# Patient Record
Sex: Female | Born: 1955 | Race: White | Hispanic: No | Marital: Married | Smoking: Never smoker
Health system: Southern US, Community
[De-identification: ages and names within clinical notes are randomized; demographics above are authoritative.]

## PROBLEM LIST (undated history)

## (undated) DIAGNOSIS — K5900 Constipation, unspecified: Secondary | ICD-10-CM

## (undated) DIAGNOSIS — M549 Dorsalgia, unspecified: Secondary | ICD-10-CM

## (undated) DIAGNOSIS — I509 Heart failure, unspecified: Secondary | ICD-10-CM

## (undated) DIAGNOSIS — F32A Depression, unspecified: Secondary | ICD-10-CM

## (undated) DIAGNOSIS — E079 Disorder of thyroid, unspecified: Secondary | ICD-10-CM

## (undated) DIAGNOSIS — M5136 Other intervertebral disc degeneration, lumbar region: Secondary | ICD-10-CM

## (undated) DIAGNOSIS — M51369 Other intervertebral disc degeneration, lumbar region without mention of lumbar back pain or lower extremity pain: Secondary | ICD-10-CM

## (undated) DIAGNOSIS — K449 Diaphragmatic hernia without obstruction or gangrene: Secondary | ICD-10-CM

## (undated) DIAGNOSIS — K219 Gastro-esophageal reflux disease without esophagitis: Secondary | ICD-10-CM

## (undated) DIAGNOSIS — F419 Anxiety disorder, unspecified: Secondary | ICD-10-CM

## (undated) DIAGNOSIS — F329 Major depressive disorder, single episode, unspecified: Secondary | ICD-10-CM

## (undated) DIAGNOSIS — M2550 Pain in unspecified joint: Secondary | ICD-10-CM

## (undated) DIAGNOSIS — G4730 Sleep apnea, unspecified: Secondary | ICD-10-CM

## (undated) DIAGNOSIS — I10 Essential (primary) hypertension: Secondary | ICD-10-CM

## (undated) DIAGNOSIS — M7989 Other specified soft tissue disorders: Secondary | ICD-10-CM

## (undated) DIAGNOSIS — E7800 Pure hypercholesterolemia, unspecified: Secondary | ICD-10-CM

## (undated) DIAGNOSIS — G4733 Obstructive sleep apnea (adult) (pediatric): Secondary | ICD-10-CM

## (undated) DIAGNOSIS — R32 Unspecified urinary incontinence: Secondary | ICD-10-CM

## (undated) DIAGNOSIS — R400 Somnolence: Secondary | ICD-10-CM

## (undated) DIAGNOSIS — R002 Palpitations: Secondary | ICD-10-CM

## (undated) DIAGNOSIS — Z9989 Dependence on other enabling machines and devices: Secondary | ICD-10-CM

## (undated) DIAGNOSIS — F988 Other specified behavioral and emotional disorders with onset usually occurring in childhood and adolescence: Secondary | ICD-10-CM

## (undated) DIAGNOSIS — K589 Irritable bowel syndrome without diarrhea: Secondary | ICD-10-CM

## (undated) HISTORY — DX: Sleep apnea, unspecified: G47.30

## (undated) HISTORY — PX: NASAL SEPTUM SURGERY: SHX37

## (undated) HISTORY — DX: Major depressive disorder, single episode, unspecified: F32.9

## (undated) HISTORY — DX: Pure hypercholesterolemia, unspecified: E78.00

## (undated) HISTORY — PX: BREAST EXCISIONAL BIOPSY: SUR124

## (undated) HISTORY — DX: Somnolence: R40.0

## (undated) HISTORY — DX: Depression, unspecified: F32.A

## (undated) HISTORY — PX: CATARACT EXTRACTION: SUR2

## (undated) HISTORY — PX: APPENDECTOMY: SHX54

## (undated) HISTORY — PX: KNEE ARTHROPLASTY: SHX992

## (undated) HISTORY — DX: Dorsalgia, unspecified: M54.9

## (undated) HISTORY — DX: Palpitations: R00.2

## (undated) HISTORY — DX: Dependence on other enabling machines and devices: Z99.89

## (undated) HISTORY — DX: Constipation, unspecified: K59.00

## (undated) HISTORY — DX: Essential (primary) hypertension: I10

## (undated) HISTORY — DX: Obstructive sleep apnea (adult) (pediatric): G47.33

## (undated) HISTORY — PX: ESOPHAGOGASTRODUODENOSCOPY: SHX1529

## (undated) HISTORY — PX: HAND SURGERY: SHX662

## (undated) HISTORY — DX: Pain in unspecified joint: M25.50

## (undated) HISTORY — DX: Disorder of thyroid, unspecified: E07.9

## (undated) HISTORY — DX: Other specified soft tissue disorders: M79.89

## (undated) HISTORY — PX: COLONOSCOPY: SHX174

## (undated) HISTORY — PX: TONSILLECTOMY AND ADENOIDECTOMY: SHX28

## (undated) HISTORY — DX: Irritable bowel syndrome, unspecified: K58.9

## (undated) HISTORY — DX: Other specified behavioral and emotional disorders with onset usually occurring in childhood and adolescence: F98.8

## (undated) HISTORY — DX: Diaphragmatic hernia without obstruction or gangrene: K44.9

## (undated) HISTORY — DX: Unspecified urinary incontinence: R32

## (undated) HISTORY — DX: Gastro-esophageal reflux disease without esophagitis: K21.9

## (undated) HISTORY — DX: Heart failure, unspecified: I50.9

## (undated) HISTORY — DX: Anxiety disorder, unspecified: F41.9

---

## 1997-10-17 ENCOUNTER — Ambulatory Visit (HOSPITAL_COMMUNITY): Admission: RE | Admit: 1997-10-17 | Discharge: 1997-10-17 | Payer: Self-pay | Admitting: Obstetrics and Gynecology

## 1998-07-29 HISTORY — PX: RADICAL HYSTERECTOMY: SHX2283

## 1998-11-10 ENCOUNTER — Ambulatory Visit (HOSPITAL_COMMUNITY): Admission: RE | Admit: 1998-11-10 | Discharge: 1998-11-10 | Payer: Self-pay | Admitting: Obstetrics and Gynecology

## 1998-11-10 ENCOUNTER — Encounter: Payer: Self-pay | Admitting: Obstetrics and Gynecology

## 1999-03-27 ENCOUNTER — Other Ambulatory Visit: Admission: RE | Admit: 1999-03-27 | Discharge: 1999-03-27 | Payer: Self-pay | Admitting: Obstetrics and Gynecology

## 1999-04-26 ENCOUNTER — Other Ambulatory Visit: Admission: RE | Admit: 1999-04-26 | Discharge: 1999-04-26 | Payer: Self-pay | Admitting: Obstetrics and Gynecology

## 1999-04-26 ENCOUNTER — Encounter (INDEPENDENT_AMBULATORY_CARE_PROVIDER_SITE_OTHER): Payer: Self-pay

## 1999-05-04 ENCOUNTER — Ambulatory Visit (HOSPITAL_BASED_OUTPATIENT_CLINIC_OR_DEPARTMENT_OTHER): Admission: RE | Admit: 1999-05-04 | Discharge: 1999-05-04 | Payer: Self-pay | Admitting: Otolaryngology

## 1999-12-26 ENCOUNTER — Encounter: Payer: Self-pay | Admitting: Obstetrics and Gynecology

## 1999-12-26 ENCOUNTER — Ambulatory Visit (HOSPITAL_COMMUNITY): Admission: RE | Admit: 1999-12-26 | Discharge: 1999-12-26 | Payer: Self-pay | Admitting: Obstetrics and Gynecology

## 2000-01-03 ENCOUNTER — Encounter: Admission: RE | Admit: 2000-01-03 | Discharge: 2000-01-03 | Payer: Self-pay | Admitting: Obstetrics and Gynecology

## 2000-01-03 ENCOUNTER — Encounter: Payer: Self-pay | Admitting: Obstetrics and Gynecology

## 2000-03-10 ENCOUNTER — Encounter (INDEPENDENT_AMBULATORY_CARE_PROVIDER_SITE_OTHER): Payer: Self-pay | Admitting: Specialist

## 2000-03-10 ENCOUNTER — Inpatient Hospital Stay (HOSPITAL_COMMUNITY): Admission: RE | Admit: 2000-03-10 | Discharge: 2000-03-12 | Payer: Self-pay | Admitting: Obstetrics and Gynecology

## 2000-08-29 ENCOUNTER — Encounter: Admission: RE | Admit: 2000-08-29 | Discharge: 2000-08-29 | Payer: Self-pay | Admitting: Urology

## 2000-08-29 ENCOUNTER — Encounter: Payer: Self-pay | Admitting: Urology

## 2001-03-03 ENCOUNTER — Other Ambulatory Visit: Admission: RE | Admit: 2001-03-03 | Discharge: 2001-03-03 | Payer: Self-pay | Admitting: Obstetrics and Gynecology

## 2001-03-06 ENCOUNTER — Encounter: Payer: Self-pay | Admitting: Obstetrics and Gynecology

## 2001-03-06 ENCOUNTER — Encounter: Admission: RE | Admit: 2001-03-06 | Discharge: 2001-03-06 | Payer: Self-pay | Admitting: Obstetrics and Gynecology

## 2002-03-04 ENCOUNTER — Other Ambulatory Visit: Admission: RE | Admit: 2002-03-04 | Discharge: 2002-03-04 | Payer: Self-pay | Admitting: Obstetrics and Gynecology

## 2002-06-14 ENCOUNTER — Encounter: Admission: RE | Admit: 2002-06-14 | Discharge: 2002-06-14 | Payer: Self-pay | Admitting: Family Medicine

## 2002-06-14 ENCOUNTER — Encounter: Payer: Self-pay | Admitting: Family Medicine

## 2002-06-15 ENCOUNTER — Emergency Department (HOSPITAL_COMMUNITY): Admission: EM | Admit: 2002-06-15 | Discharge: 2002-06-15 | Payer: Self-pay | Admitting: Emergency Medicine

## 2002-07-27 ENCOUNTER — Encounter: Payer: Self-pay | Admitting: Obstetrics and Gynecology

## 2002-07-27 ENCOUNTER — Ambulatory Visit (HOSPITAL_COMMUNITY): Admission: RE | Admit: 2002-07-27 | Discharge: 2002-07-27 | Payer: Self-pay | Admitting: Obstetrics and Gynecology

## 2003-03-14 ENCOUNTER — Other Ambulatory Visit: Admission: RE | Admit: 2003-03-14 | Discharge: 2003-03-14 | Payer: Self-pay | Admitting: Obstetrics and Gynecology

## 2003-11-29 ENCOUNTER — Ambulatory Visit (HOSPITAL_COMMUNITY): Admission: RE | Admit: 2003-11-29 | Discharge: 2003-11-29 | Payer: Self-pay | Admitting: Obstetrics and Gynecology

## 2004-03-19 ENCOUNTER — Other Ambulatory Visit: Admission: RE | Admit: 2004-03-19 | Discharge: 2004-03-19 | Payer: Self-pay | Admitting: Obstetrics and Gynecology

## 2004-12-04 ENCOUNTER — Encounter: Admission: RE | Admit: 2004-12-04 | Discharge: 2004-12-04 | Payer: Self-pay | Admitting: Obstetrics and Gynecology

## 2005-04-02 ENCOUNTER — Other Ambulatory Visit: Admission: RE | Admit: 2005-04-02 | Discharge: 2005-04-02 | Payer: Self-pay | Admitting: Obstetrics and Gynecology

## 2006-03-19 ENCOUNTER — Encounter: Admission: RE | Admit: 2006-03-19 | Discharge: 2006-03-19 | Payer: Self-pay | Admitting: Obstetrics and Gynecology

## 2006-04-01 ENCOUNTER — Encounter: Admission: RE | Admit: 2006-04-01 | Discharge: 2006-04-01 | Payer: Self-pay | Admitting: Obstetrics and Gynecology

## 2006-04-07 ENCOUNTER — Other Ambulatory Visit: Admission: RE | Admit: 2006-04-07 | Discharge: 2006-04-07 | Payer: Self-pay | Admitting: Obstetrics and Gynecology

## 2006-08-09 ENCOUNTER — Encounter: Admission: RE | Admit: 2006-08-09 | Discharge: 2006-08-09 | Payer: Self-pay | Admitting: Internal Medicine

## 2007-05-07 ENCOUNTER — Other Ambulatory Visit: Admission: RE | Admit: 2007-05-07 | Discharge: 2007-05-07 | Payer: Self-pay | Admitting: Obstetrics and Gynecology

## 2007-10-06 ENCOUNTER — Ambulatory Visit (HOSPITAL_COMMUNITY): Admission: RE | Admit: 2007-10-06 | Discharge: 2007-10-06 | Payer: Self-pay | Admitting: Obstetrics and Gynecology

## 2007-10-30 ENCOUNTER — Emergency Department (HOSPITAL_COMMUNITY): Admission: EM | Admit: 2007-10-30 | Discharge: 2007-10-30 | Payer: Self-pay | Admitting: Emergency Medicine

## 2008-05-10 ENCOUNTER — Other Ambulatory Visit: Admission: RE | Admit: 2008-05-10 | Discharge: 2008-05-10 | Payer: Self-pay | Admitting: Obstetrics and Gynecology

## 2008-05-25 ENCOUNTER — Encounter: Admission: RE | Admit: 2008-05-25 | Discharge: 2008-05-25 | Payer: Self-pay | Admitting: Obstetrics and Gynecology

## 2008-11-24 ENCOUNTER — Encounter: Admission: RE | Admit: 2008-11-24 | Discharge: 2008-11-24 | Payer: Self-pay | Admitting: Obstetrics and Gynecology

## 2009-05-11 ENCOUNTER — Other Ambulatory Visit: Admission: RE | Admit: 2009-05-11 | Discharge: 2009-05-11 | Payer: Self-pay | Admitting: Obstetrics and Gynecology

## 2009-05-17 ENCOUNTER — Encounter: Admission: RE | Admit: 2009-05-17 | Discharge: 2009-05-17 | Payer: Self-pay | Admitting: Obstetrics and Gynecology

## 2009-06-07 ENCOUNTER — Encounter: Admission: RE | Admit: 2009-06-07 | Discharge: 2009-06-07 | Payer: Self-pay | Admitting: Internal Medicine

## 2009-09-18 IMAGING — MG MM DIGITAL SCREENING BILAT
5 series · 5 of 5 positions shown · non-contrast
Comparison: none

DG SCREEN MAMMOGRAM BILATERAL
Bilateral CC and MLO view(s) were taken.

DIGITAL SCREENING MAMMOGRAM WITH CAD:
The breast tissue is heterogeneously dense.  No masses or malignant type calcifications are 
identified.  Compared with prior studies.

[R CC]
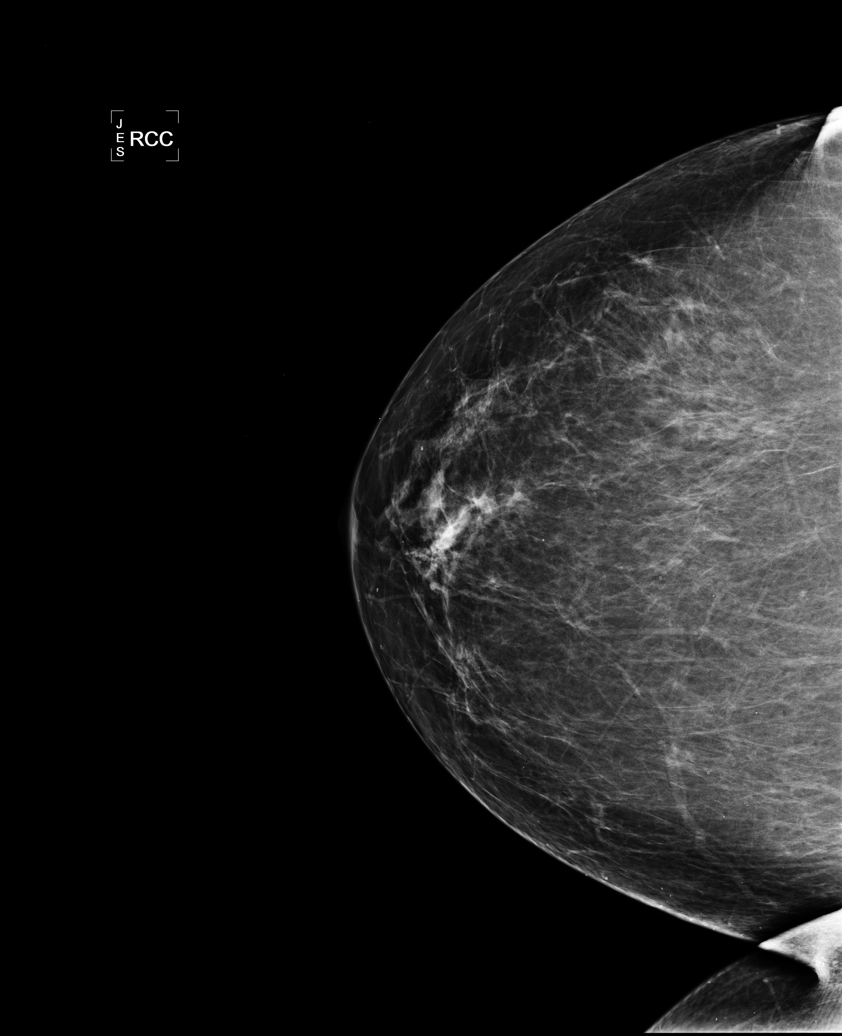

[R MLO]
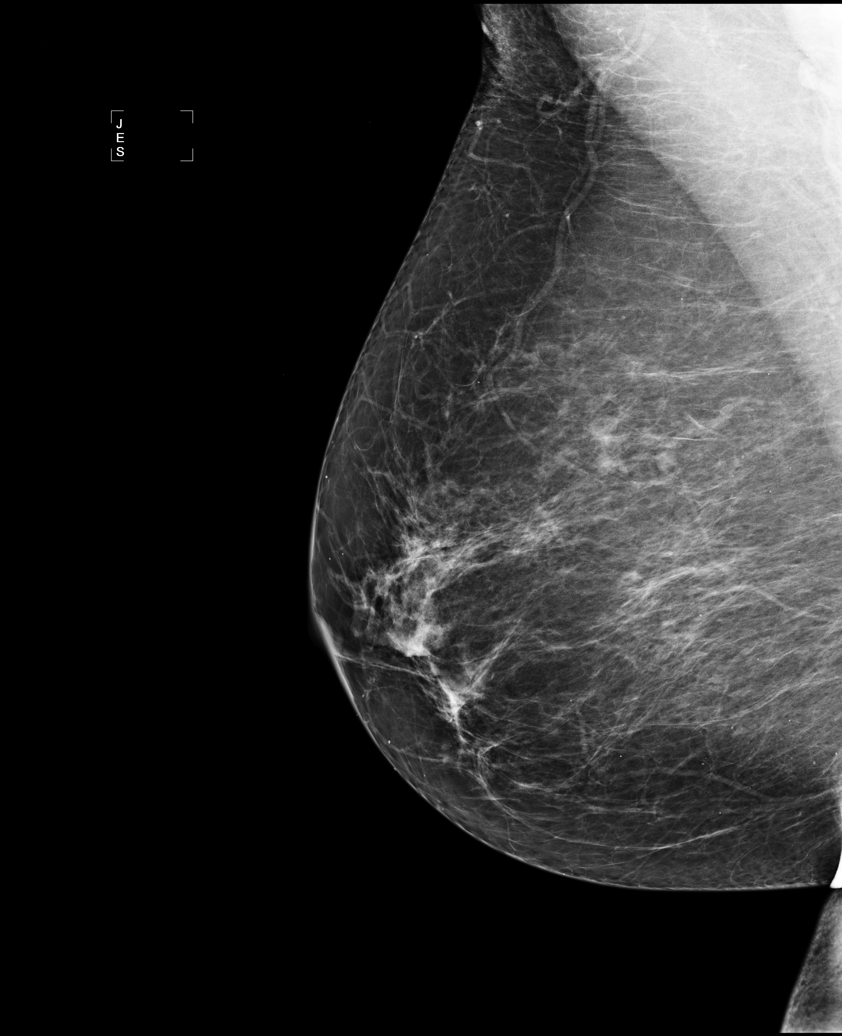

[L CC]
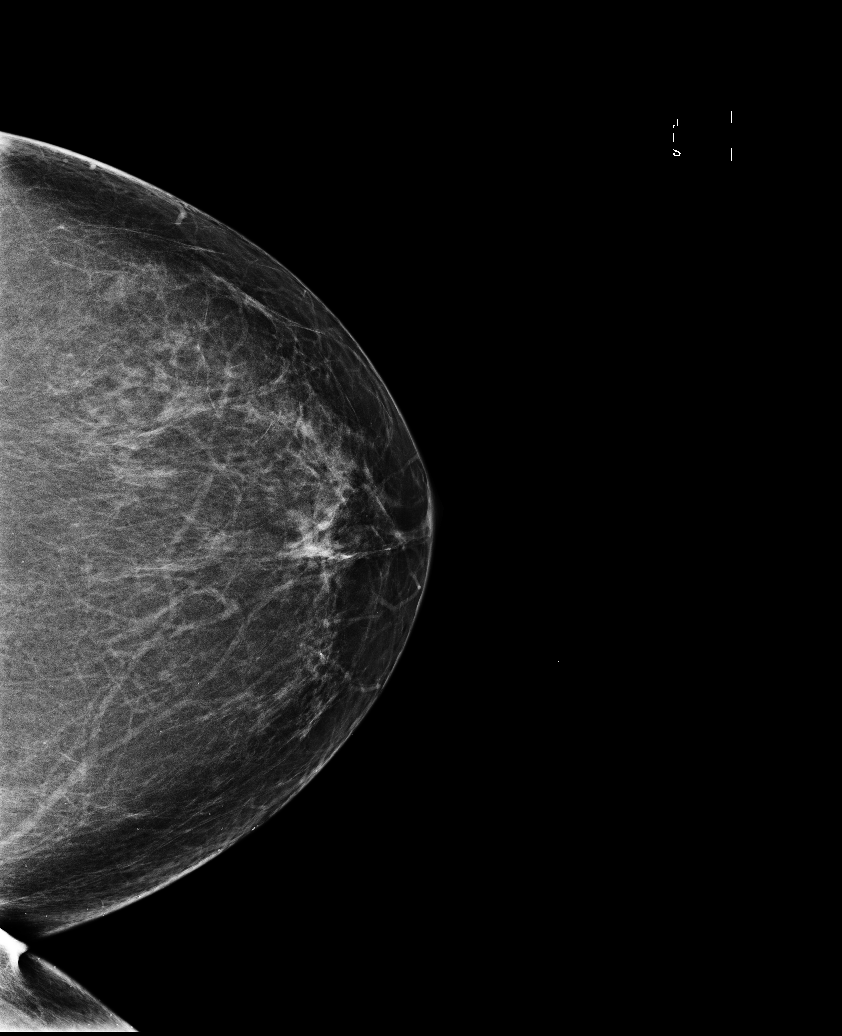

[L MLO (1 of 2)]
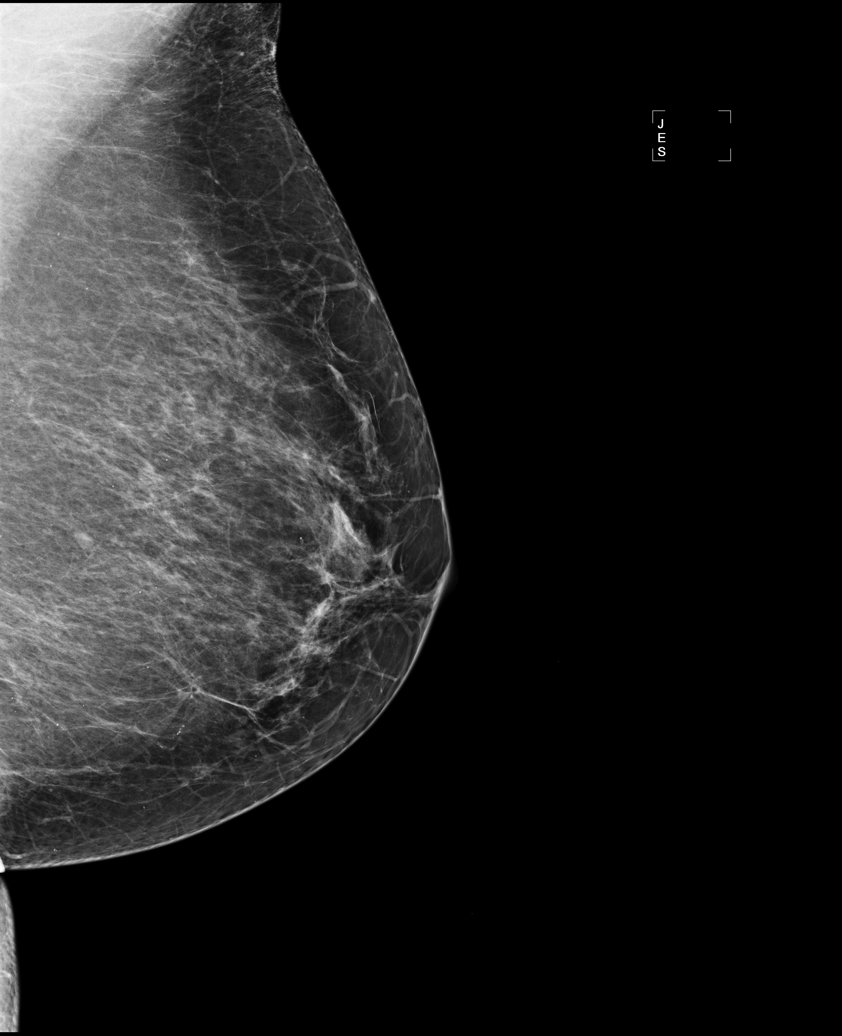

[L MLO (2 of 2)]
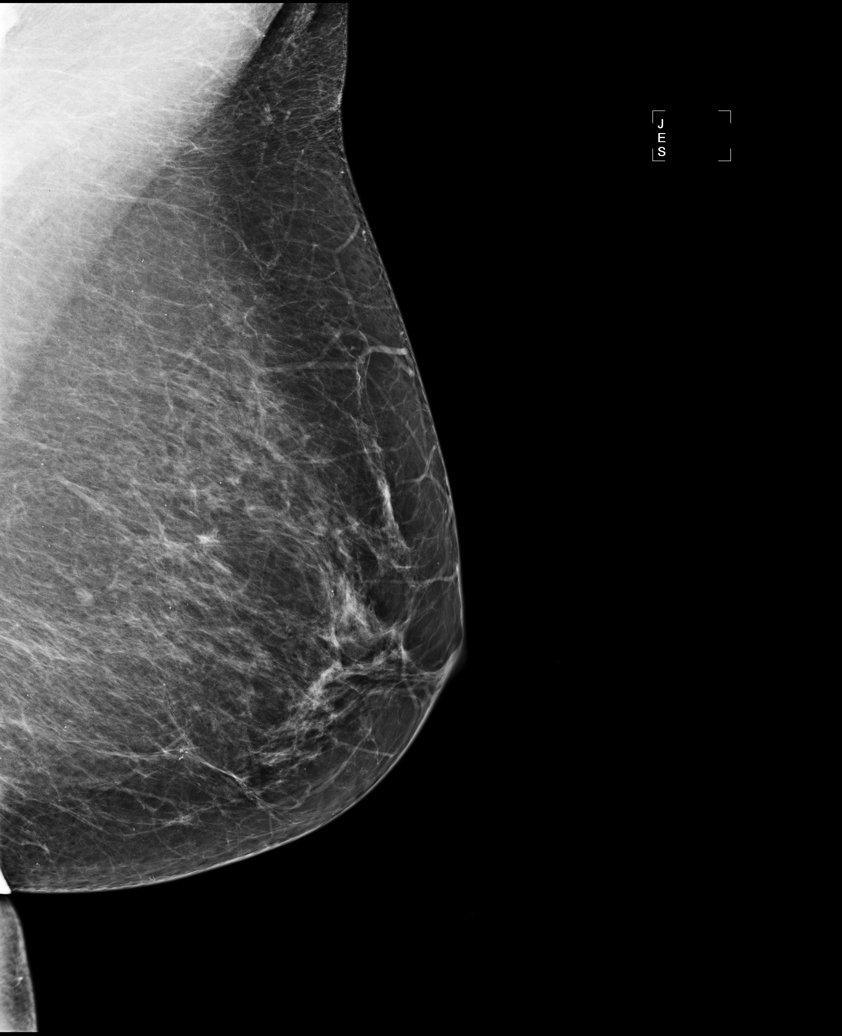

[5 of 5 positions shown; findings below may reference images not displayed]

IMPRESSION: No specific mammographic evidence of malignancy.  Next screening mammogram is recommended in one 
year.

ASSESSMENT: Negative - BI-RADS 1

Screening mammogram in 1 year.
ANALYZED BY COMPUTER AIDED DETECTION. , THIS PROCEDURE WAS A DIGITAL MAMMOGRAM.

## 2010-08-19 ENCOUNTER — Encounter: Payer: Self-pay | Admitting: Obstetrics and Gynecology

## 2010-09-28 ENCOUNTER — Other Ambulatory Visit: Payer: Self-pay | Admitting: Gastroenterology

## 2010-11-09 ENCOUNTER — Other Ambulatory Visit: Payer: Self-pay | Admitting: Obstetrics and Gynecology

## 2010-11-09 DIAGNOSIS — Z1231 Encounter for screening mammogram for malignant neoplasm of breast: Secondary | ICD-10-CM

## 2010-11-15 ENCOUNTER — Other Ambulatory Visit: Payer: Self-pay | Admitting: Obstetrics and Gynecology

## 2010-11-15 DIAGNOSIS — E041 Nontoxic single thyroid nodule: Secondary | ICD-10-CM

## 2010-11-19 ENCOUNTER — Ambulatory Visit
Admission: RE | Admit: 2010-11-19 | Discharge: 2010-11-19 | Disposition: A | Discharge status: Home / Self Care | Payer: BC Managed Care – PPO | Source: Ambulatory Visit | Attending: Obstetrics and Gynecology | Admitting: Obstetrics and Gynecology

## 2010-11-19 DIAGNOSIS — E041 Nontoxic single thyroid nodule: Secondary | ICD-10-CM

## 2010-11-20 ENCOUNTER — Ambulatory Visit
Admission: RE | Admit: 2010-11-20 | Discharge: 2010-11-20 | Disposition: A | Discharge status: Home / Self Care | Payer: BC Managed Care – PPO | Source: Ambulatory Visit | Attending: Obstetrics and Gynecology | Admitting: Obstetrics and Gynecology

## 2010-11-20 DIAGNOSIS — Z1231 Encounter for screening mammogram for malignant neoplasm of breast: Secondary | ICD-10-CM

## 2010-11-21 ENCOUNTER — Other Ambulatory Visit: Payer: Self-pay | Admitting: Obstetrics and Gynecology

## 2010-11-21 DIAGNOSIS — E041 Nontoxic single thyroid nodule: Secondary | ICD-10-CM

## 2010-11-28 ENCOUNTER — Other Ambulatory Visit: Payer: Self-pay | Admitting: Diagnostic Radiology

## 2010-11-28 ENCOUNTER — Other Ambulatory Visit (HOSPITAL_COMMUNITY)
Admission: RE | Admit: 2010-11-28 | Discharge: 2010-11-28 | Disposition: A | Discharge status: Home / Self Care | Payer: BC Managed Care – PPO | Source: Ambulatory Visit | Attending: Diagnostic Radiology | Admitting: Diagnostic Radiology

## 2010-11-28 ENCOUNTER — Ambulatory Visit
Admission: RE | Admit: 2010-11-28 | Discharge: 2010-11-28 | Disposition: A | Discharge status: Home / Self Care | Payer: BC Managed Care – PPO | Source: Ambulatory Visit | Attending: Obstetrics and Gynecology | Admitting: Obstetrics and Gynecology

## 2010-11-28 DIAGNOSIS — E041 Nontoxic single thyroid nodule: Secondary | ICD-10-CM

## 2010-11-28 DIAGNOSIS — E049 Nontoxic goiter, unspecified: Secondary | ICD-10-CM | POA: Insufficient documentation

## 2010-12-14 NOTE — H&P (Signed)
[facility]  PatientCHANDANI, [name]                        MRN: [id number] Adm. Date:  03/10/00 Attending:  Beather Arbour. Thomasena Edis, M.D.                         History and Physical  SSN:                          [id number]  DATE OF BIRTH:                01/24/1956  HISTORY OF PRESENT ILLNESS:   The patient is a 55 year old gravida 2, para 2, white female with a long history of menorrhagia.  She has been tried on numerous types of oral conceptives and continues to have daily bleeding.  The bleeding only ceased with taking twice a day oral conceptives.  She did undergo a sonohistogram which showed no space occupying lesions.  An endometrial biopsy showed a benign _______ of endometrium.  Ultrasound did show multiple uterine fibroids with an uterine size of 11.1 x 6.1 x 7.2 cm. Also, she was found to have a right cystic structure with irregular borders, 4.0 x 3.6 x 4.2 cm in the left adnexa.  The right adnexa was normal.  The patient is admitted for a total abdominal hysterectomy and bilateral salpingo-oophorectomy due to menorrhagia unresponsive to oral contraceptives, fibroid uterus, and left adnexal mass which has not resolved with oral contraceptive treatment.  A CA-125 was performed and noted to be normal at 24.1.  Risks of surgery including anesthetic complications, hemorrhage, infection, damage to adjacent structures including bladder, bowel, blood vessels, and ureters were discussed with the patient.  She was made aware of postoperative risks which could include wound infection, urinary tract infection, vesicovaginal fistula, pneumonia, DVT which could result in stroke or pulmonary embolism.  She expressed understanding of and acceptance of the risks, and thus is admitted for TAH/BSO.  She fully understands that with removal of the ovaries, she will become instantly menopausal, but desires this be done due to the large ovarian cyst which  has not resolved with oral contraceptives.  PAST MEDICAL HISTORY:         Spontaneous vaginal delivery x 2, deviated septum repair in 1980, nasal surgery and tonsillectomy for sleep apnea in October of 2000, history of hand surgery and knee surgery of the left knee. The patient also has obsessive compulsive disorder, hypertension.  She does have a history of heart murmur, but requires no antibiotics for dental work.  MEDICATIONS:                  _____ 135 b.i.d. until August 5 and then go to 10 mg daily, Prozac 80 mg daily for OCD, and Neurontin daily.  The patient was on Lamictal, but discontinued that.  She is on Rhinocort Aqua one puff daily due to allergies.  DRUG ALLERGIES:               No known drug allergies.  FAMILY HISTORY:               Maternal aunt with diabetes mellitus.  REVIEW OF SYSTEMS:            Negative.  PHYSICAL EXAMINATION:  HEENT:  Normal.  NECK:                         Supple without thyromegaly.  LUNGS:                        Clear to auscultation.  CARDIAC EXAMINATION:          Regular rate and rhythm.  ABDOMEN:                      Soft, nontender, and no hepatosplenomegaly.  PELVIC EXAMINATION:           Uterus 11-12 weeks size, _______ without any mass palpated.  RECTAL EXAMINATION:           Inflammatory and no mass.  LABORATORY DATA:              Pap smear performed March 27, 1999, was within normal limits.  ASSESSMENT AND PLAN:          The patient is a 55 year old gravida 2, para 2, white female with menorrhagia and fibroid uterus, as well as an ovarian cyst unresponsive to oral contraceptives.  Admitted for TAH/BSO.  Risks have been explained to the patient.  She expresses understanding of and acceptance of these risks, and desires to proceed with surgery. DD:  03/10/00 TD:  03/10/00 Job: [id number]

## 2010-12-14 NOTE — Op Note (Signed)
[facility]  Patient:    [name], [name]                     MRN: [id number] Proc. Date: 03/10/00 Adm. Date:  [id number] Attending:  Madelyn Flavors CC:         Lennon Alstrom. Felipa Eth, M.D.   Operative Report  PREOPERATIVE DIAGNOSIS:       Fibroid uterus, persistent left ovarian cyst, menorrhagia unresponsive to hormonal management.  POSTOPERATIVE DIAGNOSIS:      Fibroid uterus, persistent left ovarian cyst, menorrhagia unresponsive to hormonal management.  OPERATION:                    Total abdominal hysterectomy and bilateral salpingo-oophorectomy.  SURGEON:                      Beather Arbour. Thomasena Edis, M.D.  ASSISTANT:                    Freddy Finner, M.D.  ANESTHESIA:                   Monitored anesthesia care sedation plus epidural.  ESTIMATED BLOOD LOSS:         150 cc.  URINE OUTPUT:                 200 cc.  DRAINS:                       Foley.  COMPLICATIONS:                None.  FLUIDS:                       1900 cc of crystalloid.  DESCRIPTION OF PROCEDURE:     The patient was brought to the operating room and identified on the operating room table.  After induction of adequate epidural anesthesia and after the patient was adequately sedated using monitored anesthesia care, the patient was placed in the supine position and prepped and draped in the usual sterile fashion.  A Foley catheter was placed. A Pfannenstiel incision was made and carried down to the fascia.  The fascia was scored in the midline and extended bilaterally using Mayo scissors and the fascia were separated free from the underlying muscles. The muscles were separated in the midline down to the symphysis.  The peritoneum was entered bluntly and incision was incision was extended superiorly and inferiorly down to the bladder edge.  Washings were then taken due to the persistent left ovarian cyst.  The patient had been previously placed in Trendelenburg.   The OConnor-OSullivan retractor was placed.  The bowel was packed out of the operative field and the abdominal wall retracted. The bladder blade had been previously placed.  Long Kelly clamps were placed at either angle of the uterus and the uterus was elevated up to the level of the uterine incision. Manual exploration had revealed there to be no enlarged pelvic or periaortic nodes with a smooth liver edge and smooth peritoneal surfaces. The right round ligament was ligated with a figure-of-eight suture of 0 Monocryl and the anterior leaf of the peritoneum was divided to the area overlying the internal os. The clear space was developed and the infundibulopelvic ligament was clamped with a curved Heaney, cut, tied with a free tie and then suture ligated using a suture of 0 Monocryl.  In a similar fashion, the left round ligament was ligated with a figure-of-eight suture of 0 Monocryl, divided and then the anterior leaf of the peritoneum on the left was divided down to the level overlying the internal os. The bladder was taken down.  The infundibulopelvic ligament on the left was clamped with a curved Heaney, tied with a free tie and then a suture ligature of 0 Monocryl.  The vessels were skeletonized on the left and the uterine vessels were clamped with a curved Heaney on the left, cut and suture ligated using a suture of 0 Monocryl.  An additional cardinal ligament complex bite was taken with a straight Heaney cut and suture ligated using a suture of 0 Monocryl.  In a similar fashion, the uterine vessels on the right were skeletonized, clamped with a curved Heaney, cut and suture ligated using a suture of 0 Monocryl.  Successful cardinal uterosacral ligament bites were taken on both sides, each cut and suture ligated using sutures of 0 Monocryl.  Using the curved Heaney, the left uterosacral ligament complex was clamped, cut and suture ligated using a figure-of-eight suture of 0 Monocryl  and the vagina was entered.  The same was accomplished on the right.  The uterus was separated from the vaginal cuff and sent to pathology for examination.  The cuff was closed using figure-of-eight sutures and 0 Monocryl.  There was noted to be one bleeding edge along the vaginal cuff and excellent hemostasis was achieved using a figure-of-eight suture of 0 Monocryl.  There was noted to be a small hematoma along the peritoneal edge on the right peritoneum and this was tied with a free tie of 2-0 Monocryl.  The pelvis was copiously irrigated with warm lactated Ringers and excellent hemostasis was noted at all pedicles.  At that point, the patient was taken out of Trendelenburg.  The OConnor-OSullivan retractor, and bladder blade retractor as well abdominal wall retractor were removed. All packs were removed from the operative field.  After again noting hemostasis, Kelly clamps were placed on the peritoneum and the peritoneum was closed using simple running suture of 2-0 Monocryl.  The subfascial areas were examined for evidence of hemostasis and excellent hemostasis was achieved using cautery.  The fascia was closed using two sutures of PDS with each suture anchored at either end of the suture, run to the midline and tied in a simple running fashion.  The subcutaneous tissue was irrigated copiously with warm lactated Ringers and excellent hemostasis was achieved using cautery. The skin was closed with staples, and Steri-Strips were applied.  The patient tolerated the procedure well without apparent complications and was transferred to the recovery room in stable condition after sponge, needle and instrument counts were correct.  The patient stated upon my arrival that she was glad to be having this hysterectomy performed because despite Provera all weekend she bled copiously through numerous pads. DD:  03/10/00 TD:  03/10/00 Job: [id number]

## 2010-12-14 NOTE — Discharge Summary (Signed)
[facility]  Patient:    [name], [name]                     MRN: [id number] Adm. Date:  [id number] Disc. Date: [id number] Attending:  Madelyn Flavors CC:         [facility]   Discharge Summary  HISTORY OF PRESENT ILLNESS:   The patient is a 55 year old, gravida 2, para 2, white female with long history of menorrhagia unresponsive to oral contraceptives.  For further details, please see the dictated History & Physical.  HOSPITAL COURSE:              The patient was admitted and subsequently underwent a TAH-BSO for refractory menorrhagia.  In addition, she had been found to have a right cystic structure with irregular borders and a 4.0 x 3.6 x 4.2 cm cyst to the left adnexa.  The patient, as indicated above, was admitted and did undergo a TAH-BSO for menorrhagia and persistent ovarian cysts.  Estimated blood loss was 150 cc.  Anesthesia was MAC sedation plus epidural.  Postoperatively, the patient had a very stable postoperative course.  On postop 0, she was receiving excellent analgesia.  On postop day #1, she was complaining of gas pain.  Postop hemoglobin was 9.7.  Dressing was clean, dry, and intact, and she was noted to have scant vaginal bleeding.  On postoperative day #2, there was no CVA tenderness, no calf tenderness. Incision was clean, dry, and intact.  The patient desired discharge and was discharged home.  DISCHARGE INSTRUCTIONS:       She was given a prescription for Premarin 1.25 mg p.o. q.d., dispense 34 with 12 refills and Tylox, dispense 30, 1 to 2 p.o. q.3-4h. p.r.n. pain.  Do not operate any heavy machinery while taking this medication.  She was also urged to take ibuprofen 600 mg every 6 hours as needed for pain.  She was instructed to return to the office in five days for staple removal.  She is to call for temperature greater than 100.5 persistently, heavy vaginal bleeding, or any problems. DD:  05/07/00 TD:   05/09/00 Job: [id number]

## 2011-03-06 ENCOUNTER — Other Ambulatory Visit: Payer: Self-pay | Admitting: Endocrinology

## 2011-03-06 DIAGNOSIS — E041 Nontoxic single thyroid nodule: Secondary | ICD-10-CM

## 2011-06-10 ENCOUNTER — Ambulatory Visit
Admission: RE | Admit: 2011-06-10 | Discharge: 2011-06-10 | Disposition: A | Discharge status: Home / Self Care | Payer: BC Managed Care – PPO | Source: Ambulatory Visit | Attending: Endocrinology | Admitting: Endocrinology

## 2011-06-10 DIAGNOSIS — E041 Nontoxic single thyroid nodule: Secondary | ICD-10-CM

## 2012-03-12 ENCOUNTER — Other Ambulatory Visit: Payer: Self-pay | Admitting: Obstetrics and Gynecology

## 2012-03-12 DIAGNOSIS — Z1231 Encounter for screening mammogram for malignant neoplasm of breast: Secondary | ICD-10-CM

## 2012-03-19 ENCOUNTER — Ambulatory Visit
Admission: RE | Admit: 2012-03-19 | Discharge: 2012-03-19 | Disposition: A | Discharge status: Home / Self Care | Payer: BC Managed Care – PPO | Source: Ambulatory Visit | Attending: Obstetrics and Gynecology | Admitting: Obstetrics and Gynecology

## 2012-03-19 DIAGNOSIS — Z1231 Encounter for screening mammogram for malignant neoplasm of breast: Secondary | ICD-10-CM

## 2012-03-24 ENCOUNTER — Other Ambulatory Visit: Payer: Self-pay | Admitting: Obstetrics and Gynecology

## 2012-03-24 DIAGNOSIS — R928 Other abnormal and inconclusive findings on diagnostic imaging of breast: Secondary | ICD-10-CM

## 2012-03-25 ENCOUNTER — Ambulatory Visit
Admission: RE | Admit: 2012-03-25 | Discharge: 2012-03-25 | Disposition: A | Discharge status: Home / Self Care | Payer: BC Managed Care – PPO | Source: Ambulatory Visit | Attending: Obstetrics and Gynecology | Admitting: Obstetrics and Gynecology

## 2012-03-25 DIAGNOSIS — R928 Other abnormal and inconclusive findings on diagnostic imaging of breast: Secondary | ICD-10-CM

## 2012-04-30 ENCOUNTER — Other Ambulatory Visit: Payer: Self-pay | Admitting: Unknown Physician Specialty

## 2012-04-30 DIAGNOSIS — M545 Low back pain, unspecified: Secondary | ICD-10-CM

## 2012-05-03 ENCOUNTER — Other Ambulatory Visit: Payer: BC Managed Care – PPO

## 2012-05-03 ENCOUNTER — Ambulatory Visit
Admission: RE | Admit: 2012-05-03 | Discharge: 2012-05-03 | Disposition: A | Discharge status: Home / Self Care | Payer: BC Managed Care – PPO | Source: Ambulatory Visit | Attending: Unknown Physician Specialty | Admitting: Unknown Physician Specialty

## 2012-05-03 DIAGNOSIS — M545 Low back pain, unspecified: Secondary | ICD-10-CM

## 2012-09-25 ENCOUNTER — Other Ambulatory Visit: Payer: Self-pay | Admitting: Obstetrics and Gynecology

## 2012-09-25 DIAGNOSIS — R921 Mammographic calcification found on diagnostic imaging of breast: Secondary | ICD-10-CM

## 2012-09-30 ENCOUNTER — Other Ambulatory Visit: Payer: Self-pay | Admitting: Orthopedic Surgery

## 2012-09-30 DIAGNOSIS — M899 Disorder of bone, unspecified: Secondary | ICD-10-CM

## 2012-10-23 ENCOUNTER — Ambulatory Visit
Admission: RE | Admit: 2012-10-23 | Discharge: 2012-10-23 | Disposition: A | Discharge status: Home / Self Care | Payer: BC Managed Care – PPO | Source: Ambulatory Visit | Attending: Orthopedic Surgery | Admitting: Orthopedic Surgery

## 2012-10-23 DIAGNOSIS — M899 Disorder of bone, unspecified: Secondary | ICD-10-CM

## 2012-11-02 ENCOUNTER — Ambulatory Visit
Admission: RE | Admit: 2012-11-02 | Discharge: 2012-11-02 | Disposition: A | Discharge status: Home / Self Care | Payer: BC Managed Care – PPO | Source: Ambulatory Visit | Attending: Obstetrics and Gynecology | Admitting: Obstetrics and Gynecology

## 2012-11-02 DIAGNOSIS — R921 Mammographic calcification found on diagnostic imaging of breast: Secondary | ICD-10-CM

## 2012-12-02 ENCOUNTER — Telehealth: Payer: Self-pay | Admitting: *Deleted

## 2012-12-02 NOTE — Telephone Encounter (Signed)
Message left that we received a referral from Dr. Parrish McKinney.  Please call to schedule a Sleep Consult.  

## 2012-12-10 ENCOUNTER — Ambulatory Visit (INDEPENDENT_AMBULATORY_CARE_PROVIDER_SITE_OTHER): Payer: BC Managed Care – PPO | Admitting: Neurology

## 2012-12-10 ENCOUNTER — Other Ambulatory Visit: Payer: Self-pay | Admitting: *Deleted

## 2012-12-10 ENCOUNTER — Encounter: Payer: Self-pay | Admitting: Neurology

## 2012-12-10 VITALS — BP 125/77 | HR 50 | Temp 97.9°F | Ht 65.5 in | Wt 168.0 lb

## 2012-12-10 DIAGNOSIS — G4711 Idiopathic hypersomnia with long sleep time: Secondary | ICD-10-CM

## 2012-12-10 DIAGNOSIS — G4710 Hypersomnia, unspecified: Secondary | ICD-10-CM

## 2012-12-10 NOTE — Patient Instructions (Addendum)
Based on your symptoms and your exam I believe you are at risk for idiopathic hypersomnolence. I recommend a sleep study and nap study as discussed. Please be off of dextroampetamine x 4 days, start reducing it to once daily 1 week before the sleep study for 3 days, then off for 4 days prior to the sleep test. We will not do your nap study if you have obstructive sleep apnea. I will see you back after your sleep study to go over the test results and where to go from there. We will call you after your sleep study and to set up an appointment at the time.

## 2012-12-10 NOTE — Progress Notes (Signed)
Subjective:    Patient ID: [name] is a 57 y.o. female.  HPI  Huston Foley, MD, PhD [facility] [address], [address] [address] [place], [place] [address]  Dear Dr. Nolen Mu,   I saw your patient, [name], upon your kind request in my neurologic clinic today for initial consultation of her sleep disorder, in particular, concern for obstructive sleep apnea. The patient is unaccompanied today. As you know, [name] is a very pleasant 57 year old right-handed woman with an underlying medical history of hypothyroidism, hypertension, OCD and mood disorder who has been experiencing daytime somnolence and snoring. She also has difficulty with sleep maintenance. Her medications are risperidone, Depakote ER, synthroid, Lamictal and dextroamphetamine. She had a sleep study in [place] some 15 years ago which, per her report, was negative for OSA, but positive for snoring and she was advised to get her tonsils out and she had as TE and has not been snoring nearly as much any more. Her main concern is EDS and her ESS is 19/24. She does not nap during the weekday, but on the weekend she sleeps a lot. She has been falling asleep inadvertently for years.  Her typical bedtime is reported to be around MN to 2 or even 3 AM and usual wake time is around 5 - 7 hours later. She resets the snooze button many times and manages to get up at 7:50 AM. She has been on the dextroamphetamine for about 2 years and tries not to take it on the WE. The dextroamphetamine has helped her stay awake at work and also focus and function better. She has a hard time concentrating and thinking at work. She works at a [employer]. She has no FHx of OSA or sleepiness d/o. Left to her own devices, she would sleep about 11 hours. She falls asleep quickly and does not tend to wake up during the night. She does not wake up with a HA typically.  She has not fallen asleep while driving.   She  has been known to snore for the past many years, but now it is mild and no apneas are reported. The patient denies a sense of choking or strangling feeling. There is no report of nighttime reflux, with no nighttime cough experienced. The patient has not noted any RLS symptoms and is not known to kick while asleep or before falling asleep. She is not a very restless sleeper.   She denies cataplexy, sleep paralysis, hypnagogic or hypnopompic hallucinations, or sleep attacks. She does not report any vivid dreams, nightmares, dream enactments, or parasomnias, such as sleep talking or sleep walking. She consumes 1 caffeinated beverage per day, usually in the form of ice tea.  She continues to have mood related issues in particular depression and is quite tearful at times today.  Her Past Medical History Is Significant For: Past Medical History  Diagnosis Date  . Hypertension   . Depression   . Anxiety   . Hypercholesteremia     Her Past Surgical History Is Significant For: Past Surgical History  Procedure Laterality Date  . Nasal septum surgery    . Tonsillectomy and adenoidectomy    . Radical hysterectomy    . Knee arthroplasty Left   . Hand surgery Right     Her Family History Is Significant For: Family History  Problem Relation Age of Onset  . Diabetes Maternal Grandmother   . Heart disease Father     Her Social History Is  Significant For: History   Social History  . Marital Status: Married    Spouse Name: N/A    Number of Children: N/A  . Years of Education: N/A   Social History Main Topics  . Smoking status: Never Smoker   . Smokeless tobacco: None  . Alcohol Use: 0.5 oz/week    1 drink(s) per week  . Drug Use: No  . Sexually Active: None   Other Topics Concern  . None   Social History Narrative  . None    Her Allergies Are:  No Known Allergies:   Her Current Medications Are:  Outpatient Encounter Prescriptions as of 12/10/2012  Medication Sig Dispense Refill   . aspirin 81 MG tablet Take 81 mg by mouth daily.      . budesonide (RHINOCORT AQUA) 32 MCG/ACT nasal spray Place 1 spray into the nose daily.      . calcium-vitamin D (OSCAL WITH D) 500-200 MG-UNIT per tablet Take 1 tablet by mouth daily.      . cetirizine (ZYRTEC) 10 MG tablet Take 10 mg by mouth daily.      Marland Kitchen dextroamphetamine (DEXTROSTAT) 10 MG tablet       . divalproex (DEPAKOTE ER) 500 MG 24 hr tablet       . levothyroxine (SYNTHROID, LEVOTHROID) 50 MCG tablet       . omega-3 acid ethyl esters (LOVAZA) 1 G capsule Take 1 mg by mouth daily.      Marland Kitchen PREMARIN 0.3 MG tablet       . risperiDONE (RISPERDAL) 1 MG tablet Take 0.5 mg by mouth daily.      [name] 40-10-25 MG TABS       . indomethacin (INDOCIN) 25 MG capsule       . nitrofurantoin, macrocrystal-monohydrate, (MACROBID) 100 MG capsule       . predniSONE (STERAPRED UNI-PAK) 5 MG TABS       . [DISCONTINUED] ciprofloxacin (CIPRO) 500 MG tablet        No facility-administered encounter medications on file as of 12/10/2012.  :  Review of Systems  Constitutional: Positive for fatigue.  Neurological:       Memory los  Psychiatric/Behavioral: Positive for confusion, sleep disturbance and dysphoric mood. The patient is nervous/anxious.        Sleepiness    Objective:  Neurologic Exam  Physical Exam Physical Examination:   Filed Vitals:   12/10/12 0830  BP: 125/77  Pulse: 50  Temp: 97.9 F (36.6 C)    General Examination: The patient is a very pleasant 57 y.o. female in no acute distress. She appears well-developed and well-nourished and well groomed.   HEENT: Normocephalic, atraumatic, pupils are equal, round and reactive to light and accommodation. Funduscopic exam is normal with sharp disc margins noted. Extraocular tracking is good without limitation to gaze excursion or nystagmus noted. Normal smooth pursuit is noted. Hearing is grossly intact. Tympanic membranes are clear bilaterally. Face is symmetric with normal  facial animation and normal facial sensation. Speech is clear with no dysarthria noted. There is no hypophonia. There is no lip, neck/head, jaw or voice tremor. Neck is supple with full range of passive and active motion. There are no carotid bruits on auscultation. Oropharynx exam reveals: good dental hygiene and minimal airway crowding, due to floppy soft palate. Mallampati is class II. Tongue protrudes centrally and palate elevates symmetrically. Tonsils are absent. Neck size is 14 1/8 inches.   Chest: Clear to auscultation without wheezing, rhonchi or crackles noted.  Heart: S1+S2+0, regular and normal without murmurs, rubs or gallops noted.   Abdomen: Soft, non-tender and non-distended with normal bowel sounds appreciated on auscultation.  Extremities: There is no pitting edema in the distal lower extremities bilaterall  Skin: Warm and dry without trophic changes noted. There are no varicose veins.  Musculoskeletal: exam reveals no obvious joint deformities, tenderness or joint swelling or erythema.   Neurologically:  Mental status: The patient is awake, alert and oriented in all 4 spheres. Her memory, attention, language and knowledge are appropriate. There is no aphasia, agnosia, apraxia or anomia. Speech is clear with normal prosody and enunciation. Thought process is linear. Mood is congruent and affect is flat.  Cranial nerves are as described above under HEENT exam. In addition, shoulder shrug is normal with equal shoulder height noted. Motor exam: Normal bulk, strength and tone is noted. There is no drift, tremor or rebound. Romberg is negative. Reflexes are 2+ throughout. Toes are downgoing bilaterally. Fine motor skills are intact with normal finger taps, normal hand movements, normal rapid alternating patting, normal foot taps and normal foot agility.  Cerebellar testing shows no dysmetria or intention tremor on finger to nose testing. Heel to shin is unremarkable bilaterally. There  is no truncal or gait ataxia.  Sensory exam is intact to light touch, pinprick, vibration, temperature sense and proprioception in the upper and lower extremities.  Gait, station and balance are unremarkable. No veering to one side is noted. No leaning to one side is noted. Posture is age-appropriate and stance is narrow based. No problems turning are noted. She turns en bloc. Tandem walk is unremarkable. Intact toe and heel stance is noted.               Assessment and Plan:   In summary, [name] is a very pleasant 57 y.o.-year old female with a history depression, anxiety, thyroid dysfunction, who exhibits severe daytime sleepiness. By the way, she brought in her sleep log and she does sleep about 6-7 hours on an average night but says that she would sleep up to 10-11 hours each night if she could. She tries not to take or stimulants on the weekends and does fall asleep frequently over the weekend. She has had this type of sleepiness for several years. 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 I agree with you that this may not be sleepiness just from taking sedating medications. She has been taking these medications for quite some time. The amphetamine is working fairly at this time. At least she can make it through the work day. At this juncture would like to recommend a sleep study followed by an MSLT. I explained sleep test procedure to her and my concern for idiopathic hypersomnolence. The sleep study will help [name] make that diagnosis I believe. I would like for her to be off of her stimulant medication for the sleep test. She became quite anxious at the thought of having to discontinue her stimulant. We compromised to the effect that she will be off of it for 4 days only for the sleep test and take 3 days of one pill once daily prior to that. Confounding factors in her case are the suboptimally treated mood disorder  and sedating medications which we will not be able to taper off before the sleep test for fear of exacerbating her mood disorder. I will see her back after the sleep testing is completed. Thank  you very much for allowing me to participate in the care of this nice patient. If I can be of any further assistance to you please do not hesitate to call me at [phone].  Sincerely,   Huston Foley, MD, PhD

## 2012-12-23 ENCOUNTER — Telehealth: Payer: Self-pay | Admitting: Neurology

## 2012-12-23 NOTE — Progress Notes (Signed)
Please call and advise patient that if her mood disorder allows she can stop the Risperdal for the sleep study. From what I can see she is only taking 0.5 mg once daily and could just stop this 2-3 days prior to her sleep studies.

## 2012-12-23 NOTE — Telephone Encounter (Signed)
Pt called today with concerns regarding taking the Risperdal.  Says that you currently have her weaning from the Dextrostat for the MSLT, but is worried because when she takes the Risperdal it causes her to be subdued and brings on drowsiness.  She just wants to double-check that this will still be a valid study even while she is taking this medication.

## 2012-12-24 ENCOUNTER — Ambulatory Visit (INDEPENDENT_AMBULATORY_CARE_PROVIDER_SITE_OTHER): Payer: BC Managed Care – PPO

## 2012-12-24 VITALS — BP 116/59 | HR 69 | Ht 65.5 in | Wt 168.0 lb

## 2012-12-24 DIAGNOSIS — G4710 Hypersomnia, unspecified: Secondary | ICD-10-CM

## 2012-12-24 DIAGNOSIS — G4733 Obstructive sleep apnea (adult) (pediatric): Secondary | ICD-10-CM

## 2012-12-24 DIAGNOSIS — G4711 Idiopathic hypersomnia with long sleep time: Secondary | ICD-10-CM

## 2012-12-25 ENCOUNTER — Encounter: Payer: BC Managed Care – PPO | Admitting: *Deleted

## 2013-01-01 ENCOUNTER — Telehealth: Payer: Self-pay | Admitting: Neurology

## 2013-01-01 DIAGNOSIS — G4733 Obstructive sleep apnea (adult) (pediatric): Secondary | ICD-10-CM

## 2013-01-01 NOTE — Telephone Encounter (Signed)
Please call and notify the patient that the recent sleep study did confirm the diagnosis of obstructive sleep apnea and that I recommend treatment for this in the form of CPAP. This will require a repeat sleep study for proper titration and mask fitting. Please explain to patient and arrange for a CPAP titration study. I have placed an order in the chart. Thanks, Sirinity Outland, MD, PhD [facility] ([facility]  

## 2013-01-06 ENCOUNTER — Encounter: Payer: Self-pay | Admitting: *Deleted

## 2013-01-06 NOTE — Telephone Encounter (Signed)
LM for pt on home and mobile numbers.  Asked her to return my call.  Explained I would mail a copy of the report to her home.  Explained need to return for treatment study called CPAP Titration and that we can arrange that appointment.  Study faxed to [facility] 01/06/2013, also released to Dr. Felipa Eth (PCP) and to the patient.

## 2013-01-06 NOTE — Telephone Encounter (Signed)
Spoke to patient regarding results, explained in detail.  She understands need for CPAP Titration and what will happen at the study.  She was not near a calendar so she will call back tomorrow to schedule her CPAP appointment.

## 2013-01-14 ENCOUNTER — Ambulatory Visit (INDEPENDENT_AMBULATORY_CARE_PROVIDER_SITE_OTHER): Payer: BC Managed Care – PPO

## 2013-01-14 VITALS — BP 130/70 | HR 64 | Ht 65.5 in | Wt 168.0 lb

## 2013-01-14 DIAGNOSIS — G4733 Obstructive sleep apnea (adult) (pediatric): Secondary | ICD-10-CM

## 2013-02-05 ENCOUNTER — Telehealth: Payer: Self-pay | Admitting: Neurology

## 2013-02-05 ENCOUNTER — Encounter: Payer: Self-pay | Admitting: *Deleted

## 2013-02-05 DIAGNOSIS — G4733 Obstructive sleep apnea (adult) (pediatric): Secondary | ICD-10-CM

## 2013-02-05 NOTE — Telephone Encounter (Signed)
Called patient to discuss sleep study results from 01/14/2013.  Discussed findings, recommendations and follow up care.  Patient understood well and all questions were answered.  She understands she will be starting CPAP therapy at home and will be contacted by [facility] in order to get this equipment set up for her.    Copy of report faxed to [facility].  Copy of report mailed to pt with follow up letter with instructions for scheduling appt with Dr. Frances Furbish.

## 2013-02-05 NOTE — Telephone Encounter (Signed)
Please call and notify patient that the recent sleep study showed good results with cpap. Therefore, I would like start the patient on CPAP at home. I placed the order in the chart.   Arrange for CPAP set up at home through a DME company of patient's choice and fax/route report to PCP and referring MD (if other than PCP).   The patient will also need a follow up appointment with me in 6-8 weeks post set up that has to be scheduled; help the patient schedule this (in a follow-up slot).   Please re-enforce the importance of compliance with treatment and the need for [name] to monitor compliance data.   Once you have spoken to the patient and scheduled the return appointment, you may close this encounter, thanks,   Huston Foley, MD, PhD [facility] ([facility]

## 2013-03-19 ENCOUNTER — Ambulatory Visit (INDEPENDENT_AMBULATORY_CARE_PROVIDER_SITE_OTHER): Payer: BC Managed Care – PPO | Admitting: Physician Assistant

## 2013-03-19 VITALS — BP 114/72 | HR 80 | Temp 98.8°F | Resp 18 | Ht 65.0 in | Wt 161.0 lb

## 2013-03-19 DIAGNOSIS — R300 Dysuria: Secondary | ICD-10-CM

## 2013-03-19 DIAGNOSIS — N390 Urinary tract infection, site not specified: Secondary | ICD-10-CM

## 2013-03-19 LAB — POCT URINALYSIS DIPSTICK
Bilirubin, UA: NEGATIVE
Glucose, UA: NEGATIVE
Nitrite, UA: NEGATIVE
Protein, UA: NEGATIVE
Spec Grav, UA: 1.02
Urobilinogen, UA: 0.2
pH, UA: 7

## 2013-03-19 LAB — POCT UA - MICROSCOPIC ONLY
Casts, Ur, LPF, POC: NEGATIVE
Crystals, Ur, HPF, POC: NEGATIVE
Yeast, UA: NEGATIVE

## 2013-03-19 MED ORDER — NITROFURANTOIN MONOHYD MACRO 100 MG PO CAPS: 100.0000 mg | ORAL_CAPSULE | Freq: Two times a day (BID) | ORAL | Status: DC

## 2013-03-19 NOTE — Progress Notes (Addendum)
Patient ID: [name] MRN: [id number], DOB: 01/04/1956, 57 y.o. Date of Encounter: 03/19/2013, 6:24 PM  Primary Physician: Hoyle Sauer, MD  Chief Complaint: urinary frequency and dysuria  HPI: 57 y.o. year old female with presents with 5 day history of dysuria and suprapubic pressure. Denies flank pain, nausea, vomiting, fever, chills, vaginal discharge, or odor. Has tried increasing fluids. Patient is postmenopausal.  Does have hx of UTI's - last in 09/2012.   Patient is otherwise doing well without issues or complaints.  Past Medical History  Diagnosis Date  . Hypertension   . Depression   . Anxiety   . Hypercholesteremia      Home Meds: Prior to Admission medications   Medication Sig Start Date End Date Taking? Authorizing Provider  aspirin 81 MG tablet Take 81 mg by mouth daily.   Yes Historical Provider, MD  budesonide (RHINOCORT AQUA) 32 MCG/ACT nasal spray Place 1 spray into the nose daily.   Yes Historical Provider, MD  calcium-vitamin D (OSCAL WITH D) 500-200 MG-UNIT per tablet Take 1 tablet by mouth daily.   Yes Historical Provider, MD  cetirizine (ZYRTEC) 10 MG tablet Take 10 mg by mouth daily.   Yes Historical Provider, MD  dextroamphetamine (DEXTROSTAT) 10 MG tablet  11/29/12  Yes Historical Provider, MD  divalproex (DEPAKOTE ER) 500 MG 24 hr tablet  11/29/12  Yes Historical Provider, MD  lamoTRIgine (LAMICTAL) 150 MG tablet Take 150 mg by mouth daily.   Yes Historical Provider, MD  levothyroxine (SYNTHROID, LEVOTHROID) 50 MCG tablet  11/29/12  Yes Historical Provider, MD  omega-3 acid ethyl esters (LOVAZA) 1 G capsule Take 1 mg by mouth daily.   Yes Historical Provider, MD  PREMARIN 0.3 MG tablet  11/20/12  Yes Historical Provider, MD  Marya Landry 40-10-25 MG TABS  11/14/12  Yes Historical Provider, MD  Vortioxetine HBr (BRINTELLIX) 10 MG TABS Take by mouth.   Yes Historical Provider, MD  indomethacin (INDOCIN) 25 MG capsule  11/02/12   Historical Provider, MD    nitrofurantoin, macrocrystal-monohydrate, (MACROBID) 100 MG capsule Take 1 capsule (100 mg total) by mouth 2 (two) times daily. 03/19/13   Sigrid Schwebach Jaquita Rector, PA-C  predniSONE (STERAPRED UNI-PAK) 5 MG TABS  09/29/12   Historical Provider, MD  risperiDONE (RISPERDAL) 1 MG tablet Take 0.5 mg by mouth daily.    Historical Provider, MD    Allergies: No Known Allergies  History   Social History  . Marital Status: Married    Spouse Name: N/A    Number of Children: N/A  . Years of Education: N/A   Occupational History  . Not on file.   Social History Main Topics  . Smoking status: Never Smoker   . Smokeless tobacco: Not on file  . Alcohol Use: 0.5 oz/week    1 drink(s) per week  . Drug Use: No  . Sexual Activity: Not on file   Other Topics Concern  . Not on file   Social History Narrative  . No narrative on file     Review of Systems: Constitutional: negative for chills, fever, night sweats, weight changes, or fatigue  HEENT: negative for vision changes, hearing loss, congestion, rhinorrhea, ST, epistaxis, or sinus pressure Cardiovascular: negative for chest pain or palpitations Respiratory: negative for cough, hemoptysis, wheezing, shortness of breath. Abdominal: positive for suprapubic abdominal pain,   No nausea, vomiting, diarrhea, or constipation Genitourinary: positive for urinary frequency and dysuria. Negative for vaginal discharge, odor, or pelvic pain.  Dermatological: negative for rashes.  Neurologic: negative for headache, dizziness, or syncope   Physical Exam: Blood pressure 114/72, pulse 80, temperature 98.8 F (37.1 C), temperature source Oral, resp. rate 18, height 5\' 5"  (1.651 m), weight 161 lb (73.029 kg), SpO2 98.00%., Body mass index is 26.79 kg/(m^2). General: Well developed, well nourished, in no acute distress. Head: Normocephalic, atraumatic, eyes without discharge, sclera non-icteric, nares are without discharge. External ear normal in appearance. Neck:  Supple. No thyromegaly. Full ROM. No lymphadenopathy. Lungs: Clear bilaterally to auscultation without wheezes, rales, or rhonchi. Breathing is unlabored. Heart: RRR with S1 S2. No murmurs, rubs, or gallops appreciated. Abdominal: +BS x 4. No hepatosplenomegaly, rebound tenderness, or guarding. Positive suprapubic tenderness. No CVA tenderness bilaterally.  Msk:  Strength and tone normal for age. Extremities/Skin: Warm and dry. No clubbing or cyanosis. No edema.  Neuro: Alert and oriented X 3. Moves all extremities spontaneously. Gait is normal. CNII-XII grossly in tact. Psych:  Responds to questions appropriately with a normal affect.   Labs: Results for orders placed in visit on 03/19/13  POCT URINALYSIS DIPSTICK      Result Value Range   Color, UA yellow     Clarity, UA clear     Glucose, UA neg     Bilirubin, UA neg     Ketones, UA trace     Spec Grav, UA 1.020     Blood, UA mod     pH, UA 7.0     Protein, UA neg     Urobilinogen, UA 0.2     Nitrite, UA neg     Leukocytes, UA small (1+)    POCT UA - MICROSCOPIC ONLY      Result Value Range   WBC, Ur, HPF, POC 6-18     RBC, urine, microscopic 12-23     Bacteria, U Microscopic trace     Mucus, UA trace     Epithelial cells, urine per micros 0-2     Crystals, Ur, HPF, POC neg     Casts, Ur, LPF, POC neg     Yeast, UA neg       ASSESSMENT AND PLAN:  57 y.o. year old female with urinary tract infection Urine culture sent Increase fluids Macrobid 100 mg bid x 7 days Follow up if symptoms worsen or fail to improve. Discussed need for repeat UA s/p treatment completion to ensure clearance of hematuria If culture shows no growth, recommend repeat UA and referral to urology for evaluation of hematuria.    Grier Mitts, PA-C 03/19/2013 6:24 PM

## 2013-03-23 ENCOUNTER — Other Ambulatory Visit: Payer: Self-pay | Admitting: Physician Assistant

## 2013-03-23 LAB — URINE CULTURE: Colony Count: 40000

## 2013-03-23 MED ORDER — CIPROFLOXACIN HCL 500 MG PO TABS: 500.0000 mg | ORAL_TABLET | Freq: Two times a day (BID) | ORAL | Status: DC

## 2013-03-31 ENCOUNTER — Encounter: Payer: Self-pay | Admitting: Neurology

## 2013-03-31 NOTE — Progress Notes (Signed)
Quick Note:  I reviewed the patient's CPAP compliance data from 03/01/13 to 03/16/13, which is a total of 15 days, during which time the patient used CPAP every day except for 1 day. The average usage for all days was 6 hours and 41 minutes. The percent used days greater than 4 hours was 88%, indicating good compliance. The residual AHI was 6.5 per hour, indicating a fair treatment pressure of 6 cwp. I will review this data with the patient at the next visit, provide feedback and additional trouble shooting if need be.  She needs to be set up for a FU appt as I don't see one pending. Please assist.  Huston Foley, MD, PhD [facility]   ______

## 2013-04-01 ENCOUNTER — Encounter: Payer: Self-pay | Admitting: Neurology

## 2013-04-06 ENCOUNTER — Ambulatory Visit (INDEPENDENT_AMBULATORY_CARE_PROVIDER_SITE_OTHER): Payer: BC Managed Care – PPO | Admitting: Neurology

## 2013-04-06 ENCOUNTER — Encounter: Payer: Self-pay | Admitting: Neurology

## 2013-04-06 VITALS — BP 116/76 | HR 66 | Temp 98.1°F | Ht 65.5 in | Wt 158.0 lb

## 2013-04-06 DIAGNOSIS — F32A Depression, unspecified: Secondary | ICD-10-CM

## 2013-04-06 DIAGNOSIS — G4733 Obstructive sleep apnea (adult) (pediatric): Secondary | ICD-10-CM

## 2013-04-06 DIAGNOSIS — F329 Major depressive disorder, single episode, unspecified: Secondary | ICD-10-CM

## 2013-04-06 DIAGNOSIS — G4710 Hypersomnia, unspecified: Secondary | ICD-10-CM

## 2013-04-06 DIAGNOSIS — R400 Somnolence: Secondary | ICD-10-CM | POA: Insufficient documentation

## 2013-04-06 HISTORY — DX: Obstructive sleep apnea (adult) (pediatric): G47.33

## 2013-04-06 HISTORY — DX: Somnolence: R40.0

## 2013-04-06 NOTE — Patient Instructions (Signed)
Use a nasal rinse system. You can continue with flonase.  Please continue using your CPAP regularly. While your insurance requires that you use CPAP at least 4 hours each night on 70% of the nights, I recommend, that you not skip any nights and use it throughout the night if you can. Getting used to CPAP does take time and patience and discipline. Untreated obstructive sleep apnea when it is moderate to severe can have an adverse impact on cardiovascular health and raise her risk for heart disease, arrhythmias, hypertension, congestive heart failure, stroke and diabetes. Untreated obstructive sleep apnea causes sleep disruption, nonrestorative sleep, and sleep deprivation. This can have an impact on your day to day functioning and cause daytime sleepiness and impairment of cognitive function, memory loss, mood disturbance, and problems focussing. Using CPAP regularly can improve these symptoms.  Take your Dextrostat at 1 PM or if twice daily, take it at 10 AM and 1-2PM for the second dose. No later than 3 PM to avoid trouble sleeping at night. You have more than one reason to feel sleepy: mood disorder, medication effect, napping at later times during and OSA. Avoid late afternoon naps.

## 2013-04-06 NOTE — Progress Notes (Signed)
Subjective:    Patient ID: [name] is a 57 y.o. female.  HPI  Interim history:   [name] is a very pleasant 57 year old right-handed woman with an underlying medical history of hypertension, OCD and mood disorder who presents for followup consultation after a recent set of sleep studies. I had originally met her at the request of her psychiatrist on 12/10/2012, at which time she reported a prior sleep study some 15 years ago but no diagnosis of obstructive sleep apnea. She has recently been experiencing worsening daytime somnolence and snoring. Her baseline sleep study and her CPAP titration study were discussed. Her baseline sleep study was from 12/24/2012 and demonstrated a total AHI of 11 per hour, supine AHI of 23.3 per hour and a REM related AHI of 30 per hour with an oxyhemoglobin desaturation nadir of 81%. Based on those test results I requested that she come back for a CPAP titration study which she had on 01/14/2013. Sleep efficiency was reduced at 75.7% with a normal sleep latency. Wake after sleep onset was increased at 89 minutes with mild sleep fragmentation noted. Her arousal index was low. Her sleep architecture showed a normal percentage of stage I and stage II sleep, and he increased percentage of slow-wave sleep at 34.4% and a mildly decreased percentage of REM sleep at 14% with a normal REM latency. She had no significant periodic leg movements. She had CPAP from 5 cm to 6 cm of pressure and had almost complete resolution of her sleep disordered breathing on 6 cm of water pressure with a medium nasal pillows mask. I reviewed compliance data from 03/01/2013 through 03/16/2013 which is a total of 15 days during which time she used it every day except for one day. Her percent use stays greater than 4 hours was 88% with an average usage for all days of 6 hours and 41 minutes. Her residual AHI was 6.5.  I also reviewed her compliance data from 03/01/13 to 03/31/13 which is a total of  29 days, during which time she used it every day except for 2 days. The average usage for all days was 5 hours and 56 minutes. The percent used days greater than 4 hours was 94%, indicating excellent compliance. The residual AHI was 4.3 per hour, indicating an appropriate treatment pressure of 6 cwp. .  She goes to bed by MN and it takes her a long time to fall asleep. She has been on Lamictal with a recent increase, and Depakote and has started a new antidepressant. She has been on Dextrostat for the past 2 years prn. She takes 10 mg in the morning. She wakes up around 10 AM. She falls asleep around 4 PM and may take a 2 hour nap. She has not necessarily noted an improvement with CPAP, and indicates full compliance. She has occasional sinus congestion. She takes Flonase. She has not been using a nasal rinse system.  Her Past Medical History Is Significant For: Past Medical History  Diagnosis Date  . Hypertension   . Depression   . Anxiety   . Hypercholesteremia     Her Past Surgical History Is Significant For: Past Surgical History  Procedure Laterality Date  . Nasal septum surgery    . Tonsillectomy and adenoidectomy    . Radical hysterectomy    . Knee arthroplasty Left   . Hand surgery Right     Her Family History Is Significant For: Family History  Problem Relation Age of Onset  .  Diabetes Maternal Grandmother   . Heart disease Father     Her Social History Is Significant For: History   Social History  . Marital Status: Married    Spouse Name: N/A    Number of Children: N/A  . Years of Education: N/A   Social History Main Topics  . Smoking status: Never Smoker   . Smokeless tobacco: None  . Alcohol Use: 0.5 oz/week    1 drink(s) per week  . Drug Use: No  . Sexual Activity: None   Other Topics Concern  . None   Social History Narrative  . None    Her Allergies Are:  No Known Allergies:   Her Current Medications Are:  Outpatient Encounter Prescriptions as  of 04/06/2013  Medication Sig Dispense Refill  . aspirin 81 MG tablet Take 81 mg by mouth daily.      . budesonide (RHINOCORT AQUA) 32 MCG/ACT nasal spray Place 1 spray into the nose daily.      . calcium-vitamin D (OSCAL WITH D) 500-200 MG-UNIT per tablet Take 1 tablet by mouth daily.      . cetirizine (ZYRTEC) 10 MG tablet Take 10 mg by mouth daily.      Marland Kitchen dextroamphetamine (DEXTROSTAT) 10 MG tablet       . divalproex (DEPAKOTE ER) 500 MG 24 hr tablet       . lamoTRIgine (LAMICTAL) 150 MG tablet Take 150 mg by mouth daily.      Marland Kitchen levothyroxine (SYNTHROID, LEVOTHROID) 50 MCG tablet       . omega-3 acid ethyl esters (LOVAZA) 1 G capsule Take 1 mg by mouth daily.      Marland Kitchen PREMARIN 0.3 MG tablet       . TRIBENZOR 40-10-25 MG TABS       . Vortioxetine HBr (BRINTELLIX) 10 MG TABS Take 20 mg by mouth daily.       . [DISCONTINUED] ciprofloxacin (CIPRO) 500 MG tablet Take 1 tablet (500 mg total) by mouth 2 (two) times daily.  10 tablet  0  . [DISCONTINUED] indomethacin (INDOCIN) 25 MG capsule       . [DISCONTINUED] nitrofurantoin, macrocrystal-monohydrate, (MACROBID) 100 MG capsule Take 1 capsule (100 mg total) by mouth 2 (two) times daily.  14 capsule  0  . [DISCONTINUED] predniSONE (STERAPRED UNI-PAK) 5 MG TABS       . [DISCONTINUED] risperiDONE (RISPERDAL) 1 MG tablet Take 0.5 mg by mouth daily.       No facility-administered encounter medications on file as of 04/06/2013.  : Review of Systems  Constitutional: Positive for fatigue and unexpected weight change (loss).  Gastrointestinal: Positive for constipation.  Endocrine: Positive for heat intolerance.  Neurological:       Memory loss  Psychiatric/Behavioral: Positive for confusion, sleep disturbance and dysphoric mood. The patient is nervous/anxious.     Objective:  Neurologic Exam  Physical Exam Physical Examination:   Filed Vitals:   04/06/13 1440  BP: 116/76  Pulse: 66  Temp: 98.1 F (36.7 C)    General Examination: The  patient is a very pleasant 57 y.o. female in no acute distress. She appears well-developed and well-nourished and well groomed.   HEENT: Normocephalic, atraumatic, pupils are equal, round and reactive to light and accommodation. Extraocular tracking is good without limitation to gaze excursion or nystagmus noted. Normal smooth pursuit is noted. Hearing is grossly intact. Face is symmetric with normal facial animation and normal facial sensation. Speech is clear with no dysarthria noted. There is no  hypophonia. There is no lip, neck/head, jaw or voice tremor. Neck is supple with full range of passive and active motion. There are no carotid bruits on auscultation. Oropharynx exam reveals: good dental hygiene and minimal airway crowding, due to floppy soft palate. Mallampati is class II. Tongue protrudes centrally and palate elevates symmetrically. Tonsils are absent.  Chest: Clear to auscultation without wheezing, rhonchi or crackles noted.  Heart: S1+S2+0, regular and normal without murmurs, rubs or gallops noted.   Abdomen: Soft, non-tender and non-distended with normal bowel sounds appreciated on auscultation.  Extremities: There is no pitting edema in the distal lower extremities bilaterall  Skin: Warm and dry without trophic changes noted. There are no varicose veins.  Musculoskeletal: exam reveals no obvious joint deformities, tenderness or joint swelling or erythema.   Neurologically:  Mental status: The patient is awake, alert and oriented in all 4 spheres. Her memory, attention, language and knowledge are appropriate. There is no aphasia, agnosia, apraxia or anomia. Speech is clear with normal prosody and enunciation. Thought process is linear. Mood is congruent and affect is flat.  Cranial nerves are as described above under HEENT exam. In addition, shoulder shrug is normal with equal shoulder height noted. Motor exam: Normal bulk, strength and tone is noted. There is no drift, tremor or  rebound. Romberg is negative. Reflexes are 2+ throughout. Toes are downgoing bilaterally. Fine motor skills are intact with normal finger taps, normal hand movements, normal rapid alternating patting, normal foot taps and normal foot agility.  Cerebellar testing shows no dysmetria or intention tremor on finger to nose testing. There is no truncal or gait ataxia.  Sensory exam is intact to light touch in the upper and lower extremities.  Gait, station and balance are unremarkable. No veering to one side is noted. No leaning to one side is noted. Posture is age-appropriate and stance is narrow based. No problems turning are noted. She turns en bloc.                Assessment and Plan:   In summary, [name] is a very pleasant 57 y.o.-year old female with a history depression, anxiety, and thyroid dysfunction, who has been c/o of severe daytime sleepiness. She has a recent Dx of OSA, now on CPAP with very good compliance, but she reports residual EDS. She is congratulated on using CPAP regularly. I reviewed her sleep test results again with her as well as her most recent compliance data. She is advised that she has more than one reason to feel sleepy: I think her mood disorder is still a component. She is still in the process of optimizing her treatment with Dr. Nolen Mu. She has recently started and he went had are present. She had an increase in her Lamictal dose. She is also advised that medication effect can be in part a cause for daytime sleepiness. She has obstructive sleep apnea but thankfully she has had great subjective improvement of her sleep disordered breathing with CPAP. Nevertheless she indicates that she has not noted much in the way of difference in her daytime somnolence. From the oxygen standpoint for example she had a 10 point improvement in her oxygen desaturation nadir with treatment with CPAP. Sleep hygiene is also important. I've asked her not to take a late afternoon nap. She is  advised to take her Dextrostat either at 1 PM or as Dr. Nolen Mu has allowed a second dose she can take it at 10 AM and then a second dose  between 1 and 2, no later than 3 PM so not to get in trouble with nighttime sleep. I really want her to avoid and the late afternoon naps. She is advised to continue with Flonase as needed. Furthermore she may want to try a saline nasal rinse system such as the Neti pot. She was in agreement. She is advised to followup with me in 6 months from now, sooner if the need arises and she is encouraged to call with any interim questions, concerns, problems or updates.

## 2013-04-14 ENCOUNTER — Ambulatory Visit (INDEPENDENT_AMBULATORY_CARE_PROVIDER_SITE_OTHER): Payer: BC Managed Care – PPO | Admitting: Endocrinology

## 2013-04-14 ENCOUNTER — Encounter: Payer: Self-pay | Admitting: Endocrinology

## 2013-04-14 VITALS — BP 136/78 | HR 66 | Temp 98.7°F | Resp 12 | Ht 65.0 in | Wt 159.7 lb

## 2013-04-14 DIAGNOSIS — E041 Nontoxic single thyroid nodule: Secondary | ICD-10-CM

## 2013-04-14 NOTE — Patient Instructions (Addendum)
STOP SYNTHROID.   

## 2013-04-14 NOTE — Progress Notes (Signed)
Patient ID: TIMAYA BOJARSKI, female   DOB: 18-Oct-1955, 57 y.o.   MRN: [id number]  Reason for Appointment:  Thyroid nodule, followup visit    History of Present Illness:   The thyroid nodule was first diagnosed in 2012 by her gynecologist incidentally. It was 3 cm on her initial thyroid ultrasound She also had several subcentimeter nodules on her ultrasound Biopsy in 5/12 showed hyperplastic nodule The patient has no local discomfort and does not think she has seen any change in the size recently Her thyroid nodule has not changed in size on previous followup visits           Supplementation with 50 mcg of Synthroid was started in 2012 at the recommendation of her previous psychiatrist to help with depression. However she did not feel any different with starting the supplement. No history of hypothyroidism on Hashimoto thyroiditis. Last year was advised to discontinue her medication but she has not done so    Her last TSH in 2013 was 1.5.  No results found for this basename: TSH        Medication List       This list is accurate as of: 04/14/13  4:10 PM.  Always use your most recent med list.               aspirin 81 MG tablet  Take 81 mg by mouth daily.     BRINTELLIX 10 MG Tabs  Generic drug:  Vortioxetine HBr  Take 20 mg by mouth daily.     calcium-vitamin D 500-200 MG-UNIT per tablet  Commonly known as:  OSCAL WITH D  Take 1 tablet by mouth daily.     cetirizine 10 MG tablet  Commonly known as:  ZYRTEC  Take 10 mg by mouth daily.     dextroamphetamine 10 MG tablet  Commonly known as:  DEXTROSTAT     divalproex 500 MG 24 hr tablet  Commonly known as:  DEPAKOTE ER     lamoTRIgine 150 MG tablet  Commonly known as:  LAMICTAL  Take 150 mg by mouth daily.     levothyroxine 50 MCG tablet  Commonly known as:  SYNTHROID, LEVOTHROID     omega-3 acid ethyl esters 1 G capsule  Commonly known as:  LOVAZA  Take 1 mg by mouth daily.     PREMARIN 0.3 MG tablet   Generic drug:  estrogens (conjugated)     RHINOCORT AQUA 32 MCG/ACT nasal spray  Generic drug:  budesonide  Place 1 spray into the nose daily.     TRIBENZOR 40-10-25 MG Tabs  Generic drug:  Olmesartan-Amlodipine-HCTZ        Past Medical History  Diagnosis Date  . Hypertension   . Depression   . Anxiety   . Hypercholesteremia   . OSA on CPAP 04/06/2013  . Daytime somnolence 04/06/2013    Past Surgical History  Procedure Laterality Date  . Nasal septum surgery    . Tonsillectomy and adenoidectomy    . Radical hysterectomy    . Knee arthroplasty Left   . Hand surgery Right     Family History  Problem Relation Age of Onset  . Diabetes Maternal Grandmother   . Heart disease Father     Social History:  reports that she has never smoked. She does not have any smokeless tobacco history on file. She reports that she drinks about 0.5 ounces of alcohol per week. She reports that she does not use illicit drugs.  Allergies: No  Known Allergies   Examination:   BP 136/78  Pulse 66  Temp(Src) 98.7 F (37.1 C)  Resp 12  Ht 5\' 5"  (1.651 m)  Wt 159 lb 11.2 oz (72.439 kg)  BMI 26.58 kg/m2  SpO2 95%   GENERAL APPEARANCE:  She looks well.    NECK: 2-2.5 cm right lobe nodule palpated, smooth, firm.left lobe not palpable           NEUROLOGIC EXAM: DTRs 2+ bilaterally at biceps.    Assessments/PLAN:  1. History of multinodular goiter with dominant right-sided nodule. This has been benign by biopsy and has not shown any increase in size over the last 2 years, clinically he feels relatively smaller at this year. Do not think that 50 mcg of Synthroid would be beneficial in suppression. Normal TSH 2. Supplementation with 50 mcg of Synthroid which was done at the recommendation of her previous psychiatrist. No history of hypothyroidism on Hashimoto thyroiditis. Discussed with her that this is not likely to be beneficial for her depression treatment and she is taking other effective  medications. We had discussed stopping this previously and she agrees to stop this. Will forward information to her PCP also     Tamaj Jurgens 04/14/2013, 4:10 PM

## 2013-04-23 ENCOUNTER — Encounter: Payer: Self-pay | Admitting: Neurology

## 2013-05-04 NOTE — Progress Notes (Signed)
Quick Note:  I reviewed the patient's CPAP compliance data from 03/21/2013 to 04/19/2013, which is a total of 30 days, during which time the patient used CPAP every day. The average usage for all days was 6 hours and 17 minutes. The percent used days greater than 4 hours was 93 %, indicating excellent compliance. The residual AHI was 3 per hour, indicating an adequate treatment pressure of 6 cwp. I will review this data with the patient at the next visit, provide feedback and additional trouble shooting if need be.  Huston Foley, MD, PhD [facility]   ______

## 2013-05-13 ENCOUNTER — Encounter: Payer: Self-pay | Admitting: Neurology

## 2013-05-25 ENCOUNTER — Other Ambulatory Visit: Payer: Self-pay | Admitting: Obstetrics and Gynecology

## 2013-05-25 DIAGNOSIS — R921 Mammographic calcification found on diagnostic imaging of breast: Secondary | ICD-10-CM

## 2013-06-10 ENCOUNTER — Encounter: Payer: Self-pay | Admitting: Neurology

## 2013-06-11 NOTE — Progress Notes (Signed)
Quick Note:  I reviewed the patient's CPAP compliance data from 04/15/2013 to 05/14/2013, which is a total of 30 days, during which time the patient used CPAP every day. The average usage for all days was 5 hours and 56 minutes. The percent used days greater than 4 hours was 93 %, indicating excellent compliance. The residual AHI was 3.8 per hour, indicating an appropriate treatment pressure of 6 cwp. I will review this data with the patient at the next office visit, provide feedback and additional troubleshooting if need be.  Huston Foley, MD, PhD [facility]   ______

## 2013-06-14 ENCOUNTER — Ambulatory Visit
Admission: RE | Admit: 2013-06-14 | Discharge: 2013-06-14 | Disposition: A | Discharge status: Home / Self Care | Payer: BC Managed Care – PPO | Source: Ambulatory Visit | Attending: Obstetrics and Gynecology | Admitting: Obstetrics and Gynecology

## 2013-06-14 DIAGNOSIS — R921 Mammographic calcification found on diagnostic imaging of breast: Secondary | ICD-10-CM

## 2013-06-16 ENCOUNTER — Encounter: Payer: Self-pay | Admitting: Neurology

## 2013-10-04 ENCOUNTER — Ambulatory Visit: Payer: BC Managed Care – PPO | Admitting: Neurology

## 2014-06-07 ENCOUNTER — Other Ambulatory Visit: Payer: Self-pay

## 2014-06-07 DIAGNOSIS — Z1231 Encounter for screening mammogram for malignant neoplasm of breast: Secondary | ICD-10-CM

## 2014-06-27 ENCOUNTER — Ambulatory Visit: Payer: BC Managed Care – PPO

## 2014-08-08 ENCOUNTER — Ambulatory Visit: Payer: BLUE CROSS/BLUE SHIELD | Attending: Orthopaedic Surgery | Admitting: Physical Therapy

## 2014-08-08 DIAGNOSIS — M25552 Pain in left hip: Secondary | ICD-10-CM | POA: Diagnosis not present

## 2014-08-08 DIAGNOSIS — S76319D Strain of muscle, fascia and tendon of the posterior muscle group at thigh level, unspecified thigh, subsequent encounter: Secondary | ICD-10-CM | POA: Diagnosis present

## 2014-08-08 DIAGNOSIS — Y93B1 Activity, exercise machines primarily for muscle strengthening: Secondary | ICD-10-CM | POA: Diagnosis not present

## 2014-08-11 ENCOUNTER — Ambulatory Visit: Payer: BLUE CROSS/BLUE SHIELD | Admitting: Physical Therapy

## 2014-08-11 DIAGNOSIS — S76319D Strain of muscle, fascia and tendon of the posterior muscle group at thigh level, unspecified thigh, subsequent encounter: Secondary | ICD-10-CM | POA: Diagnosis not present

## 2014-08-17 ENCOUNTER — Ambulatory Visit: Payer: BLUE CROSS/BLUE SHIELD | Admitting: Physical Therapy

## 2014-08-17 DIAGNOSIS — S76319D Strain of muscle, fascia and tendon of the posterior muscle group at thigh level, unspecified thigh, subsequent encounter: Secondary | ICD-10-CM | POA: Diagnosis not present

## 2014-08-22 ENCOUNTER — Ambulatory Visit: Payer: BLUE CROSS/BLUE SHIELD | Admitting: Physical Therapy

## 2014-08-22 DIAGNOSIS — S76319D Strain of muscle, fascia and tendon of the posterior muscle group at thigh level, unspecified thigh, subsequent encounter: Secondary | ICD-10-CM | POA: Diagnosis not present

## 2014-08-24 ENCOUNTER — Ambulatory Visit: Payer: BLUE CROSS/BLUE SHIELD | Admitting: Physical Therapy

## 2014-08-24 DIAGNOSIS — S76319D Strain of muscle, fascia and tendon of the posterior muscle group at thigh level, unspecified thigh, subsequent encounter: Secondary | ICD-10-CM | POA: Diagnosis not present

## 2014-08-29 ENCOUNTER — Ambulatory Visit: Payer: BLUE CROSS/BLUE SHIELD | Admitting: Physical Therapy

## 2014-08-31 ENCOUNTER — Encounter: Payer: BLUE CROSS/BLUE SHIELD | Admitting: Physical Therapy

## 2014-09-05 ENCOUNTER — Ambulatory Visit: Payer: BLUE CROSS/BLUE SHIELD | Attending: Orthopaedic Surgery | Admitting: Physical Therapy

## 2014-09-05 DIAGNOSIS — S76319D Strain of muscle, fascia and tendon of the posterior muscle group at thigh level, unspecified thigh, subsequent encounter: Secondary | ICD-10-CM | POA: Diagnosis present

## 2014-09-05 DIAGNOSIS — X58XXXD Exposure to other specified factors, subsequent encounter: Secondary | ICD-10-CM | POA: Diagnosis not present

## 2014-09-05 DIAGNOSIS — M25552 Pain in left hip: Secondary | ICD-10-CM | POA: Insufficient documentation

## 2014-09-07 ENCOUNTER — Ambulatory Visit: Payer: BLUE CROSS/BLUE SHIELD | Admitting: Physical Therapy

## 2014-09-07 DIAGNOSIS — S76319D Strain of muscle, fascia and tendon of the posterior muscle group at thigh level, unspecified thigh, subsequent encounter: Secondary | ICD-10-CM | POA: Diagnosis not present

## 2014-09-12 ENCOUNTER — Encounter: Payer: BLUE CROSS/BLUE SHIELD | Admitting: Physical Therapy

## 2014-09-16 ENCOUNTER — Encounter: Payer: BLUE CROSS/BLUE SHIELD | Admitting: Physical Therapy

## 2014-10-04 ENCOUNTER — Ambulatory Visit: Payer: BLUE CROSS/BLUE SHIELD | Attending: Orthopaedic Surgery | Admitting: Physical Therapy

## 2014-10-04 ENCOUNTER — Encounter: Payer: Self-pay | Admitting: Physical Therapy

## 2014-10-04 DIAGNOSIS — M25552 Pain in left hip: Secondary | ICD-10-CM | POA: Insufficient documentation

## 2014-10-04 DIAGNOSIS — M25559 Pain in unspecified hip: Secondary | ICD-10-CM

## 2014-10-04 DIAGNOSIS — S76319D Strain of muscle, fascia and tendon of the posterior muscle group at thigh level, unspecified thigh, subsequent encounter: Secondary | ICD-10-CM | POA: Diagnosis present

## 2014-10-04 DIAGNOSIS — M62838 Other muscle spasm: Secondary | ICD-10-CM

## 2014-10-04 NOTE — Patient Instructions (Addendum)
PT reviewed patients past HEP including flexibility exercises including piriformis, hip adductor, trunk rotation, hamstring, and lumbar flexibility.   Slow Contraction: Gravity Eliminated (Hook-Lying)   Lie with hips and knees bent. Breathe in and when breathe out  While Slowly squeeze pelvic floor for __2_ seconds. Rest for __4_ seconds. Repeat _5__ times. Do __2_ times a day.   Copyright  VHI. All rights reserved.  Pt verbally instructed patient on different dilators and vaginal massagers you may look into that are smaller than her present one. Physical therapist instructed patient to ride exercise bike 15 minutes and begin a walking program.  Patient is able to return exercises as above correctly.

## 2014-10-04 NOTE — Therapy (Signed)
[facility] [address] [place], [place], [address] Phone: [phone]   Fax:  [phone]  Physical Therapy Treatment  Patient Details  Name: [name] MRN: [id number] Date of Birth: October 31, 1955 Referring Provider:  Mcarthur Rossetti*  Encounter Date: 10/04/2014      PT End of Session - 10/04/14 1630    Visit Number 8   Date for PT Re-Evaluation 11/28/14   PT Start Time 1600   PT Stop Time 1655   PT Time Calculation (min) 55 min   Activity Tolerance Patient tolerated treatment well   Behavior During Therapy [facility] for tasks assessed/performed      Past Medical History  Diagnosis Date  . Hypertension   . Depression   . Anxiety   . Hypercholesteremia   . OSA on CPAP 04/06/2013  . Daytime somnolence 04/06/2013    Past Surgical History  Procedure Laterality Date  . Nasal septum surgery    . Tonsillectomy and adenoidectomy    . Radical hysterectomy    . Knee arthroplasty Left   . Hand surgery Right     There were no vitals taken for this visit.  Visit Diagnosis:  Pain in joint, pelvic region and thigh, unspecified laterality - Plan: PT plan of care cert/re-cert  Muscle spasm - Plan: PT plan of care cert/re-cert      Subjective Assessment - 10/04/14 1608    Symptoms Patient reports a few days without pain. I saw another therapist who reported I am doing the correct things.    Limitations Sitting   How long can you sit comfortably? 1-2 hours   How long can you stand comfortably? No problem   How long can you walk comfortably? 20 minutes when has pain   Patient Stated Goals reduce pain, identify the pain source   Currently in Pain? No/denies   Pain Score 8    Pain Location Pelvis   Pain Orientation Left   Pain Descriptors / Indicators Aching;Throbbing   Pain Type Chronic pain   Pain Onset More than a month ago   Pain Frequency Intermittent   Aggravating Factors  getting up in the morning,  sitting, exercising, bending   Pain Relieving Factors heat pad   Multiple Pain Sites No          [facility] PT Assessment - 10/04/14 0001    Assessment   Medical Diagnosis Pelvic joint pain, left,; Hamsting muscle strain   Onset Date --  2011   Precautions   Precautions None   Balance Screen   Has the patient fallen in the past 6 months No   Has the patient had a decrease in activity level because of a fear of falling?  No   Is the patient reluctant to leave their home because of a fear of falling?  No   Prior Function   Level of Independence Independent with basic ADLs   Observation/Other Assessments   Focus on Therapeutic Outcomes (FOTO)  61% limitation                Pelvic Floor Special Questions - 10/04/14 0001    Exam Type Vaginal   Palpation --  requires verbal cues to form a correct pelvic floor contract   Strength weak squeeze, no lift   Strength # of reps 3   Strength # of seconds 3          [facility] Adult PT Treatment/Exercise - 10/04/14 0001    Exercises  Exercises --  pelvic floor contraction in hookly with tactile cues   Manual Therapy   Manual Therapy Internal Pelvic Floor   Internal Pelvic Floor Pt. confirmed identification and approved PT to perform pelvic floor soft tissue work  to bil. obturatior internist, levator ani                PT Education - 10/04/14 1657    Education provided Yes   Education Details pelvic floor contraction   Person(s) Educated Patient   Methods Explanation;Demonstration;Handout;Tactile cues   Comprehension Verbalized understanding;Returned demonstration;Tactile cues required          PT Short Term Goals - 10/04/14 1624    PT SHORT TERM GOAL #1   Title be independent with HEP for flexibility exercises   Time 4   Period Weeks   Status Achieved   PT SHORT TERM GOAL #2   Title sit with minimal pain   Time 4   Period Weeks   Status Achieved   PT SHORT TERM GOAL #3   Title understand abdominal  massage to improve constipation and strain on pelvic floor   Time 4   Period Weeks   Status Achieved   PT SHORT TERM GOAL #4   Title peform daily activities with minimal increased pain   Time 4   Period Weeks   Status On-going  when has the pain unable to perform activities   PT SHORT TERM GOAL #5   Title understand correct toileting technique to empty her bowels fully   Time 4   Period Weeks   Status Achieved           PT Long Term Goals - 10/04/14 1628    PT LONG TERM GOAL #1   Title demonstrate and /or verbalize techniques to reduce the risk of re-injury to include info on posture, body mechanincs   Time 8   Period Weeks   Status On-going  sits with poor posture   PT LONG TERM GOAL #2   Title be independent with advanced HEP for strengthening   Time 8   Period Weeks   Status New   PT LONG TERM GOAL #3   Title sit without increased pain   Time 8   Period Weeks   Status New   PT LONG TERM GOAL #4   Title return to exercise program without increased pain   Time 8   Period Weeks   Status New   PT LONG TERM GOAL #5   Title perform daily activities without increased pain   Time 8   Period Weeks   Status New   Additional Long Term Goals   Additional Long Term Goals Yes   PT LONG TERM GOAL #6   Title ride the exercise bike without increased pain   Time 8   Period Weeks   Status New               Plan - 10/04/14 1658    Clinical Impression Statement Patient has trigger points in her pelvic floor muscles.  Patient requires tactile cues to form correct pelvic floor contraction.  Patient sits slouched and keeps hips IR   Pt will benefit from skilled therapeutic intervention in order to improve on the following deficits Postural dysfunction;Decreased endurance;Decreased strength;Decreased mobility;Pain;Decreased activity tolerance   Rehab Potential Good   Clinical Impairments Affecting Rehab Potential None   PT Frequency 1x / week   PT Duration 8 weeks    PT Treatment/Interventions Therapeutic activities;Patient/family education;Passive  range of motion;Therapeutic exercise;Biofeedback;Manual techniques;Neuromuscular re-education;Electrical Stimulation;Moist Heat;Ultrasound;Cryotherapy;Functional mobility training   PT Next Visit Plan pelvic floor soft tissue work   PT Home Exercise Plan pelvic floor exercises   Recommended Other Services None   Consulted and Agree with Plan of Care Patient        Problem List Patient Active Problem List   Diagnosis Date Noted  . Thyroid nodule 04/14/2013  . OSA on CPAP 04/06/2013  . Daytime somnolence 04/06/2013    Emmanuell Kantz, PT 10/04/2014, 5:06 PM  [facility] [address] [place], [place], [address] Phone: [phone]   Fax:  [phone]

## 2014-10-11 ENCOUNTER — Ambulatory Visit: Payer: BLUE CROSS/BLUE SHIELD | Admitting: Physical Therapy

## 2014-11-02 ENCOUNTER — Ambulatory Visit: Payer: BLUE CROSS/BLUE SHIELD | Attending: Orthopaedic Surgery | Admitting: Physical Therapy

## 2014-11-02 ENCOUNTER — Encounter: Payer: Self-pay | Admitting: Physical Therapy

## 2014-11-02 DIAGNOSIS — S76319D Strain of muscle, fascia and tendon of the posterior muscle group at thigh level, unspecified thigh, subsequent encounter: Secondary | ICD-10-CM | POA: Insufficient documentation

## 2014-11-02 DIAGNOSIS — M25559 Pain in unspecified hip: Secondary | ICD-10-CM

## 2014-11-02 DIAGNOSIS — M25552 Pain in left hip: Secondary | ICD-10-CM | POA: Insufficient documentation

## 2014-11-02 DIAGNOSIS — M62838 Other muscle spasm: Secondary | ICD-10-CM

## 2014-11-02 NOTE — Therapy (Signed)
[facility] [address], [place], [place], [address] Phone: [phone]   Fax:  [phone]  Physical Therapy Treatment  Patient Details  Name: [name] MRN: [id number] Date of Birth: [birth year] Referring Provider:  Mcarthur Rossetti*  Encounter Date: 11/02/2014      PT End of Session - 11/02/14 1618    Visit Number 9   Date for PT Re-Evaluation 11/28/14   PT Start Time 1610   PT Stop Time 1655   PT Time Calculation (min) 45 min   Activity Tolerance Patient tolerated treatment well   Behavior During Therapy [facility] for tasks assessed/performed      Past Medical History  Diagnosis Date  . Hypertension   . Depression   . Anxiety   . Hypercholesteremia   . OSA on CPAP 04/06/2013  . Daytime somnolence 04/06/2013    Past Surgical History  Procedure Laterality Date  . Nasal septum surgery    . Tonsillectomy and adenoidectomy    . Radical hysterectomy    . Knee arthroplasty Left   . Hand surgery Right     There were no vitals filed for this visit.  Visit Diagnosis:  Pain in joint, pelvic region and thigh, unspecified laterality  Muscle spasm      Subjective Assessment - 11/02/14 1615    Subjective I have been using a tampon for soft tissue work. Patient reports she is 60% better. Patient reports some days she is waking up with pain and as the day progressess she hs less pain.    Limitations Sitting   How long can you sit comfortably? depends on day   How long can you stand comfortably? No problem   How long can you walk comfortably? 30 minutes when has pain   Patient Stated Goals reduce pain, identify the pain source   Currently in Pain? Yes   Pain Score 6    Pain Location Pelvis   Pain Orientation Left   Pain Descriptors / Indicators Aching   Pain Type Chronic pain   Pain Onset More than a month ago   Pain Frequency Intermittent   Aggravating Factors  morning, 5x out 7 days of the week,  sitting   Pain Relieving Factors heat pad   Effect of Pain on Daily Activities sitting   Multiple Pain Sites No                    Pelvic Floor Special Questions - 11/02/14 0001    Exam Type Vaginal   Strength fair squeeze, definite lift           [facility] Adult PT Treatment/Exercise - 11/02/14 0001    Manual Therapy   Manual Therapy Internal Pelvic Floor   Internal Pelvic Floor Pt. confirmed identification and approved PT to perform pelvic floor soft tissue work with hip movement to give muscle a stretch  to bil. obturatior internist, levator ani                PT Education - 11/02/14 1652    Education provided No          PT Short Term Goals - 11/02/14 1616    PT SHORT TERM GOAL #4   Title peform daily activities with minimal increased pain   Time 4   Period Weeks   Status On-going  depends on the day           PT Long Term Goals - 11/02/14 1617  PT LONG TERM GOAL #1   Title demonstrate and /or verbalize techniques to reduce the risk of re-injury to include info on posture, body mechanincs   Time 8   Period Weeks   Status Achieved   PT LONG TERM GOAL #2   Title be independent with advanced HEP for strengthening   Time 8   Period Weeks   Status New  still learning exercises   PT LONG TERM GOAL #3   Title sit without increased pain   Time 8   Period Weeks   Status New  pain level 6/10   PT LONG TERM GOAL #4   Title return to exercise program without increased pain   Time 8   Period Weeks   Status New  has not due to schedule   PT LONG TERM GOAL #5   Title perform daily activities without increased pain   Time 8   Period Weeks   Status New  6/10 pain   PT LONG TERM GOAL #6   Title ride the exercise bike without increased pain   Time 8   Period Weeks   Status New  has not due to schedule               Plan - 11/02/14 1652    Clinical Impression Statement Patient has increased pelvic floor strength.  Patient has  trigger points in pelvic floor muscles.  Patient is learning how to manage her pain with soft tissue work at home but will now perform on both sides.    Pt will benefit from skilled therapeutic intervention in order to improve on the following deficits Postural dysfunction;Decreased endurance;Decreased strength;Decreased mobility;Pain;Decreased activity tolerance   Rehab Potential Good   Clinical Impairments Affecting Rehab Potential None   PT Duration 8 weeks   PT Treatment/Interventions Therapeutic activities;Patient/family education;Passive range of motion;Therapeutic exercise;Biofeedback;Manual techniques;Neuromuscular re-education;Electrical Stimulation;Moist Heat;Ultrasound;Cryotherapy;Functional mobility training   PT Next Visit Plan pelvic floor soft tissue work   PT Home Exercise Plan pelvic floor exercises   Consulted and Agree with Plan of Care Patient        Problem List Patient Active Problem List   Diagnosis Date Noted  . Thyroid nodule 04/14/2013  . OSA on CPAP 04/06/2013  . Daytime somnolence 04/06/2013    Quindarrius Joplin,PT 11/02/2014, 4:54 PM  [facility] [address] [place], [place], [address] Phone: [phone]   Fax:  [phone]

## 2014-11-15 ENCOUNTER — Ambulatory Visit: Payer: BLUE CROSS/BLUE SHIELD | Admitting: Physical Therapy

## 2014-11-15 ENCOUNTER — Encounter: Payer: Self-pay | Admitting: Physical Therapy

## 2014-11-15 DIAGNOSIS — M62838 Other muscle spasm: Secondary | ICD-10-CM

## 2014-11-15 DIAGNOSIS — S76319D Strain of muscle, fascia and tendon of the posterior muscle group at thigh level, unspecified thigh, subsequent encounter: Secondary | ICD-10-CM | POA: Diagnosis not present

## 2014-11-15 DIAGNOSIS — M25559 Pain in unspecified hip: Secondary | ICD-10-CM

## 2014-11-15 NOTE — Patient Instructions (Addendum)
After using the vaginal dilator pull up the pelvic floor around the dilator to work on the muscle contraction.  Hold 3 seconds 3 times.   While using the dilator put on vibrating mode to massage the muscles and change your hip position.  Patient verbally understands how to perform the above exercises.

## 2014-11-15 NOTE — Therapy (Signed)
[facility] [address] [place], [place], [address] Phone: [phone]   Fax:  [phone]  Physical Therapy Treatment  Patient Details  Name: [name] MRN: [id number] Date of Birth: 1956-04-28 Referring Provider:  Mcarthur Rossetti*  Encounter Date: 11/15/2014      PT End of Session - 11/15/14 1711    Visit Number 10   Date for PT Re-Evaluation 11/28/14   PT Start Time 1941   PT Stop Time 1705   PT Time Calculation (min) 50 min   Equipment Utilized During Treatment Other (comment)  vaginal massager   Activity Tolerance Patient tolerated treatment well   Behavior During Therapy [facility] for tasks assessed/performed      Past Medical History  Diagnosis Date  . Hypertension   . Depression   . Anxiety   . Hypercholesteremia   . OSA on CPAP 04/06/2013  . Daytime somnolence 04/06/2013    Past Surgical History  Procedure Laterality Date  . Nasal septum surgery    . Tonsillectomy and adenoidectomy    . Radical hysterectomy    . Knee arthroplasty Left   . Hand surgery Right     There were no vitals filed for this visit.  Visit Diagnosis:  Pain in joint, pelvic region and thigh, unspecified laterality  Muscle spasm      Subjective Assessment - 11/15/14 1708    Subjective Patient is able to use the vaginal massager correctly to the pelvic floor.  Patient needed cues with pelvic floor EMG to correclty relax the pelvic floor for a bowel movement.  Patient can contract the pelvic floor 1-2 uv for a pelvic floor contraction due to weakness from tightness.    Limitations Sitting   How long can you sit comfortably? depends on day   How long can you stand comfortably? No problem   How long can you walk comfortably? 30 minutes when has pain   Patient Stated Goals reduce pain, identify the pain source   Currently in Pain? Yes   Pain Score 6    Pain Location Pelvis   Pain Orientation Left   Pain  Descriptors / Indicators Aching   Pain Type Chronic pain   Pain Onset More than a month ago   Pain Frequency Intermittent   Aggravating Factors  Morning time, sitting   Pain Relieving Factors massaging and heat   Effect of Pain on Daily Activities waking up with pain   Multiple Pain Sites No                      Pelvic Floor Special Questions - 11/15/14 0001    Biofeedback reclined and sitting position   Biofeedback sensor type Surface   Biofeedback Activity Quick contraction;10 second hold;Bulging           [facility] Adult PT Treatment/Exercise - 11/15/14 0001    Manual Therapy   Manual Therapy Internal Pelvic Floor   Internal Pelvic Floor educated patient on how to use the vaginal massager on the internal pelvic floor muscle. Usen her Programmer, systems.  Patient was able to demonstrate with different hip positions.                 PT Education - 11/15/14 1705    Education provided Yes   Education Details how to use the vibrating mode of the vaginal massager, how to contract the pelvic floor correctly and bulge the pelvic floor during a bowel movement  Person(s) Educated Patient   Methods Explanation;Demonstration;Tactile cues;Verbal cues;Handout   Comprehension Returned demonstration;Verbalized understanding          PT Short Term Goals - 11/15/14 1623    PT SHORT TERM GOAL #4   Title peform daily activities with minimal increased pain   Time 4   Period Weeks   Status On-going  depends on the day           PT Long Term Goals - 11/15/14 1624    PT LONG TERM GOAL #2   Title be independent with advanced HEP for strengthening   Time 8   Period Weeks   Status On-going   PT LONG TERM GOAL #3   Title sit without increased pain   Time 8   Period Weeks   Status New  pain gets up every hour   PT LONG TERM GOAL #4   Title return to exercise program without increased pain   Time 8   Period Weeks   Status New  due to schedule   PT LONG TERM  GOAL #5   Title perform daily activities without increased pain   Time 8   Period Weeks   Status New  depends on the day   PT LONG TERM GOAL #6   Title ride the exercise bike without increased pain   Time 8   Period Weeks   Status New  has not been to the gym               Plan - 11/15/14 1712    Clinical Impression Statement Patient has will contract her pelvic floor when bearing down.  After pelvic floor EMG she was able to relax the pelvic floor for a bowel movement.  Patient able to contract the pelvic floor 1-2 uv due to weakness from hypertonic pelvic floor.  Patient pain is now intermittent instead of constant.  Patient does not have pain all day.    Pt will benefit from skilled therapeutic intervention in order to improve on the following deficits Postural dysfunction;Decreased endurance;Decreased strength;Decreased mobility;Pain;Decreased activity tolerance   Rehab Potential Good   Clinical Impairments Affecting Rehab Potential None   PT Frequency 1x / week   PT Duration 8 weeks   PT Treatment/Interventions Therapeutic activities;Patient/family education;Passive range of motion;Therapeutic exercise;Biofeedback;Manual techniques;Neuromuscular re-education;Electrical Stimulation;Moist Heat;Ultrasound;Cryotherapy;Functional mobility training   PT Next Visit Plan pelvic floor EMG, write renewal   PT Home Exercise Plan current HEP   Consulted and Agree with Plan of Care Patient        Problem List Patient Active Problem List   Diagnosis Date Noted  . Thyroid nodule 04/14/2013  . OSA on CPAP 04/06/2013  . Daytime somnolence 04/06/2013    Naryiah Schley,PT 11/15/2014, 5:15 PM  [facility] [address], [place], [place], [address] Phone: [phone]   Fax:  [phone]

## 2014-11-22 ENCOUNTER — Telehealth: Payer: Self-pay | Admitting: Obstetrics & Gynecology

## 2014-11-22 NOTE — Telephone Encounter (Signed)
Called and left a message for patient to call back to schedule a new patient doctor referral.Called and left a message for patient to call back to schedule a new patient doctor referral.

## 2014-11-28 ENCOUNTER — Encounter: Payer: Self-pay | Admitting: Obstetrics & Gynecology

## 2014-11-28 ENCOUNTER — Ambulatory Visit (INDEPENDENT_AMBULATORY_CARE_PROVIDER_SITE_OTHER): Payer: BLUE CROSS/BLUE SHIELD | Admitting: Obstetrics & Gynecology

## 2014-11-28 VITALS — BP 110/74 | HR 70 | Resp 16 | Ht 64.5 in | Wt 168.0 lb

## 2014-11-28 DIAGNOSIS — R102 Pelvic and perineal pain: Secondary | ICD-10-CM

## 2014-11-28 MED ORDER — ESTROGENS, CONJUGATED 0.625 MG/GM VA CREA: TOPICAL_CREAM | VAGINAL | Status: DC

## 2014-11-28 NOTE — Progress Notes (Addendum)
59 y.o. G81P2004 Married  Caucasian Fe here for consultation.  Pt has h/o pelvic pain that started about 5 years ago.  Pt felt like this was related to using a reformer while doing pilates.  She felt like her hips were overstretched and thinks this was the origin of the pain.  She describes her pain as deep and on the left side.  She does have pain with intercourse but doesn't think it is really related.  States she does not really enjoy intercourse and "tries to keep it at a minimum".  No vaginal bleeding.  No vaginal discharge.  Does have mild incontinence but this isn't worse over the last few years.  Also admits to chronic constipation that is related to her medications.  Has some anxiety about taking stool softeners and having diarrhea.  Admits this is almost an "OCD thing" so just won't take them.    Pt reports initially after the pain began, she spoke with her company doctor and then Dr. Dagmar Hait.  She was initially referred to Dr. Cay Schillings about three years ago.  She did have an MRI done.  This was reviewed.  No significant source of her pain was identified.    Has also seen Dr. Rush Farmer in consultation.  He wondered if her constipation was causing this.  Discussed with colorectal surgeon who advised pelvic PT first.  Pt has been seeing Earlie Counts since the beginning of Janaruy.  Pt does not feel this has made much difference.    Pt has also seen a urologist Dr. Junious Silk.  She reports having a physical exam only but no testing.  Reports exam was normal.  She did not have a cystoscopy.  He wanted her to see Ileana Roup (PT) for a "second opinion".  Hart Robinsons agreed with care given by Earlie Counts.    Pt has hx of TAH/BSO due to fibroids and bleeding.  This was done by Sander Radon who is now in [place], [place].  Pt has seen NP at [facility] in the past.    Dr. Cristina Gong is her gastroenterologist.  Last colonoscopy was six or seven years ago.    No LMP recorded. Patient has had a hysterectomy.  Tubes and  ovaries were removed as well.           Sexually active: Yes.    The current method of family planning is status post hysterectomy.    Exercising: No.  exercise Smoker:  no  Health Maintenance: Pap:  10/14.  No hx of abnormal Pap smear.  MMG:  06-14-13 density category b, birads 2:neg Colonoscopy: age 71? BMD:   2013? TDaP:  unsure Labs: none Self breast exam: done occ   reports that she has never smoked. She does not have any smokeless tobacco history on file. She reports that she drinks alcohol. She reports that she does not use illicit drugs.  Past Medical History  Diagnosis Date  . Hypertension   . Depression   . Anxiety   . Hypercholesteremia   . OSA on CPAP 04/06/2013  . Daytime somnolence 04/06/2013  . Urinary incontinence     Past Surgical History  Procedure Laterality Date  . Nasal septum surgery    . Tonsillectomy and adenoidectomy    . Radical hysterectomy    . Knee arthroplasty Left   . Hand surgery Right     Current Outpatient Prescriptions  Medication Sig Dispense Refill  . aspirin 81 MG tablet Take 81 mg by mouth daily.    Marland Kitchen  BRINTELLIX 20 MG TABS Take 20 mg by mouth daily.  3  . budesonide (RHINOCORT AQUA) 32 MCG/ACT nasal spray Place 1 spray into the nose daily.    . calcium-vitamin D (OSCAL WITH D) 500-200 MG-UNIT per tablet Take 1 tablet by mouth daily.    . cetirizine (ZYRTEC) 10 MG tablet Take 10 mg by mouth daily.    . divalproex (DEPAKOTE ER) 500 MG 24 hr tablet     . hydrOXYzine (ATARAX/VISTARIL) 10 MG tablet Take 10 mg by mouth every 6 (six) hours as needed. for anxiety  3  . lamoTRIgine (LAMICTAL) 150 MG tablet Take 150 mg by mouth daily.    Jabier Gauss 40-10-25 MG TABS     . VYVANSE 60 MG capsule Take 60 mg by mouth every morning.  0  . PREMARIN 0.3 MG tablet      No current facility-administered medications for this visit.    Family History  Problem Relation Age of Onset  . Diabetes Maternal Grandmother   . Heart disease Father      ROS:  Pertinent items are noted in HPI.  Otherwise, a comprehensive ROS was negative.  Exam:   BP 110/74 mmHg  Pulse 70  Resp 16  Ht 5' 4.5" (1.638 m)  Wt 168 lb (76.204 kg)  BMI 28.40 kg/m2 Height: 5' 4.5" (163.8 cm) Ht Readings from Last 3 Encounters:  11/28/14 5' 4.5" (1.638 m)  04/14/13 5\' 5"  (1.651 m)  04/06/13 5' 5.5" (1.664 m)    General appearance: alert, cooperative and appears stated age Head: Normocephalic, without obvious abnormality, atraumatic Abdomen: soft, non-tender; no masses,  no organomegaly Extremities: extremities normal, atraumatic, no cyanosis or edema Skin: Skin color, texture, turgor normal. No rashes or lesions Lymph nodes: Cervical, supraclavicular, and axillary nodes normal. No abnormal inguinal nodes palpated Neurologic: Grossly normal  Pelvic: External genitalia:  no lesions              Urethra:  normal appearing urethra with no masses, tenderness or lesions              Bartholin's and Skene's: normal                 Vagina: normal appearing vagina with normal color and discharge, no lesions              Cervix: absent              Pap taken: No. Bimanual Exam:  Uterus:  uterus absent              Adnexa: no mass, fullness, tenderness               Rectovaginal: Confirms               Anus:  normal sphincter tone, no lesions  With BME, there is tenderness throughout vagina.  Pt uncomfortable with all of the exam.  Palpation of pelvic floor reveals pain along left pelvic floor  Chaperone present: Yes  A:  Pelvic pain in female.  (Has seen PT, ortho, urology.  Does not seem GI related and pt has had normal colonoscopy within last 10 years.) S/p TAH/BSO due to fibroids/bleeding  P:   Readily admitted to pt that I do not feel I have a lot to offer her.  She does have atrophic changes on physical exam and it won't hurt to at least try vaginal estrogen for a couple of months.  Premarin cream 1/2 gm pv twice weekly  prescribed.  Risks reviewed  including DVT/PE/stroke/MI/Breast cancer. Will refer pt to pelvic pain clinic [facility] to see if this can help.  Referral will be made to Dr. Hiram Comber. Encouraged pt to consider different physical therapist as this does really seem to be musculoskeletal.  Had pt sign release of records from Dr. Melvyn Novas, Dr. Rush Farmer, Dr. Junious Silk to see if I can glean anything additional from these noted.  Lengthy visit with pt due to complex history and the number of physicians she has seen.  ~45 minutes spent with patient >50% of time was in face to face discussion of above.   An After Visit Summary was printed and given to the patient.  Additional documentation 12/08/14:  Reviewed outside records from [facility].  MRI of spine 2014 thought source of pain due to L5-S1 degeneration and foraminal stenosis thought source of pain.  Treated with Prev pak without significant change in pain.  Records will be scanned into EPIC.  Notes from Dr. Rush Farmer at [facility] only discuss referral to outpt PT.  Notes will be scanned into EPIC.  Notes from Dr. Junious Silk at [facility] also feel PT is the best avenue for treatment for pt.  Notes will be scanned into EPIC.

## 2014-11-30 ENCOUNTER — Encounter: Payer: Self-pay | Admitting: Physical Therapy

## 2014-11-30 ENCOUNTER — Ambulatory Visit: Payer: BLUE CROSS/BLUE SHIELD | Attending: Orthopaedic Surgery | Admitting: Physical Therapy

## 2014-11-30 DIAGNOSIS — M25559 Pain in unspecified hip: Secondary | ICD-10-CM | POA: Diagnosis present

## 2014-11-30 DIAGNOSIS — M62838 Other muscle spasm: Secondary | ICD-10-CM | POA: Diagnosis not present

## 2014-11-30 NOTE — Therapy (Signed)
[facility] [address] [place], [place], [address] Phone: [phone]   Fax:  [phone]  Physical Therapy Treatment  Patient Details  Name: [name] MRN: [id number] Date of Birth: Sep 22, 1955 Referring Provider:  Mcarthur Rossetti*  Encounter Date: 11/30/2014      PT End of Session - 11/30/14 1710    Visit Number 11   Date for PT Re-Evaluation 01/25/15   PT Start Time 1761   PT Stop Time 1710   PT Time Calculation (min) 55 min   Activity Tolerance Patient tolerated treatment well   Behavior During Therapy [facility] for tasks assessed/performed      Past Medical History  Diagnosis Date  . Hypertension   . Depression   . Anxiety   . Hypercholesteremia   . OSA on CPAP 04/06/2013  . Daytime somnolence 04/06/2013  . Urinary incontinence     Past Surgical History  Procedure Laterality Date  . Nasal septum surgery    . Tonsillectomy and adenoidectomy    . Radical hysterectomy  2000  . Knee arthroplasty Left   . Hand surgery Right     There were no vitals filed for this visit.  Visit Diagnosis:  Pain in joint, pelvic region and thigh, unspecified laterality - Plan: PT plan of care cert/re-cert  Muscle spasm - Plan: PT plan of care cert/re-cert      Subjective Assessment - 11/30/14 1624    Subjective I saw Dr. Sabra Heck who said I was tight in the vagina. She gave me the estrogen cream to see if that will help. Pain has been lasting all day for the past 2 days. No change in pain.    Limitations Sitting   How long can you sit comfortably? depends on day   How long can you stand comfortably? No problem   How long can you walk comfortably? 30 minutes when has pain   Patient Stated Goals reduce pain, identify the pain source   Currently in Pain? Yes   Pain Score 7    Pain Location Pelvis   Pain Orientation Left   Pain Descriptors / Indicators Aching   Pain Type Chronic pain   Pain Onset More than  a month ago   Pain Frequency Intermittent   Aggravating Factors  Morning time and sitting,    Pain Relieving Factors massaging and heat   Multiple Pain Sites No            [facility] PT Assessment - 11/30/14 0001    ROM / Strength   AROM / PROM / Strength --  right hip strength is 5/5                  Pelvic Floor Special Questions - 11/30/14 0001    Strength fair squeeze, definite lift   Strength # of reps 3   Strength # of seconds 3           [facility] Adult PT Treatment/Exercise - 11/30/14 0001    Manual Therapy   Manual Therapy Massage;Myofascial release   Massage to right gluteal, levator ani, piriformis, and SI joint   Myofascial Release right gluteal     Patient layed on mat and listened to video of relaxation to let the pelvic floor down then worked on looking at a circle that increases and decreases with inhalation and exhalation.            PT Education - 11/30/14 1709    Education provided  Yes   Education Details mindful meditation using an app, continue with her home program with massage and flexibility exercises.    Person(s) Educated Patient   Methods Explanation;Demonstration   Comprehension Verbalized understanding;Returned demonstration          PT Short Term Goals - 11/30/14 1711    PT SHORT TERM GOAL #4   Title peform daily activities with minimal increased pain   Time 4   Period Weeks   Status On-going  difficulty with relaxing the pelvic floor   PT SHORT TERM GOAL #5   Title understand correct toileting technique to empty her bowels fully   Time 4   Period Weeks   Status Achieved           PT Long Term Goals - 11/30/14 1711    PT LONG TERM GOAL #2   Title be independent with advanced HEP for strengthening   Time 8   Period Weeks   Status On-going  continues to learn new exercises   PT LONG TERM GOAL #3   Title sit without increased pain   Time 8   Period Weeks   Status New  today pain is constant   PT LONG TERM  GOAL #4   Title return to exercise program without increased pain   Time 8   Period Weeks   Status New  avoiding exercise due to pain   PT LONG TERM GOAL #5   Title perform daily activities without increased pain   Time 8   Period Weeks   Status New  pain is constant past 2 days   PT LONG TERM GOAL #6   Title ride the exercise bike without increased pain   Time 8   Period Weeks   Status New  has not done it yet               Plan - 11/30/14 1701    Clinical Impression Statement Patient is a 59 year old female with pelvic pain. Patient has weakness of the pelvic floor muscle, increased tension of left gluteal, left levator ani, left obturator internist, left piriformis. Patient is performing self soft tissue work to the pelvic floor muscle, diaphragmatic breathing and flexibility exercises.  Patient sitting posture continues to require verbal  cues due to her slouching and keeping her legs crossed and sitting on one buttocks to increase tnesion of the pelvic floor muscles. Patient pain is constant the past 2 days after pressure was placed on the inner left ischial tuberosity.    Pt will benefit from skilled therapeutic intervention in order to improve on the following deficits Postural dysfunction;Decreased endurance;Decreased strength;Decreased mobility;Pain;Decreased activity tolerance   Rehab Potential Good   Clinical Impairments Affecting Rehab Potential None   PT Frequency 1x / week   PT Duration 8 weeks   PT Treatment/Interventions Therapeutic activities;Patient/family education;Passive range of motion;Therapeutic exercise;Biofeedback;Manual techniques;Neuromuscular re-education;Electrical Stimulation;Moist Heat;Ultrasound;Cryotherapy;Functional mobility training   PT Next Visit Plan pelvic floor EMG   PT Home Exercise Plan current HEP   Recommended Other Services Go to [facility] for further assessment.    Consulted and Agree with Plan of Care Patient         Problem List Patient Active Problem List   Diagnosis Date Noted  . Thyroid nodule 04/14/2013  . OSA on CPAP 04/06/2013  . Daytime somnolence 04/06/2013    Adilen Pavelko,PT 11/30/2014, 5:16 PM  [facility] [address], [place], [place], [address] Phone:  [phone]   Fax:  [phone]

## 2014-12-01 ENCOUNTER — Other Ambulatory Visit (HOSPITAL_COMMUNITY): Payer: Self-pay | Admitting: Ophthalmology

## 2014-12-05 ENCOUNTER — Ambulatory Visit
Admission: RE | Admit: 2014-12-05 | Discharge: 2014-12-05 | Disposition: A | Discharge status: Home / Self Care | Payer: BLUE CROSS/BLUE SHIELD | Source: Ambulatory Visit

## 2014-12-05 DIAGNOSIS — Z1231 Encounter for screening mammogram for malignant neoplasm of breast: Secondary | ICD-10-CM

## 2014-12-09 NOTE — Addendum Note (Signed)
Addended by: Megan Salon on: 12/09/2014 03:32 PM   Modules accepted: Miquel Dunn

## 2015-01-23 NOTE — Therapy (Addendum)
[facility] [address] [place], [place], [address] Phone: [phone]   Fax:  [phone]  Patient Details  Name: [name] MRN: [id number] Date of Birth: Sep 10, 1955 Referring Provider:  Mcarthur Rossetti*  Encounter Date: 11/30/2014  PHYSICAL THERAPY DISCHARGE SUMMARY  Visits from Start of Care: 11 Current functional level related to goals / functional outcomes: Patient has constant pain when she sits. Patient is unable to return to exercise without pain. Patient is performing her HEP and doing her self soft tissue work but continues to have pain. Patient has not met LTG  Due to pain.    Remaining deficits: Patient has not returned to therapy since her last visit on 11/30/2014.   Education / Equipment: HEP Plan:                                                    Patient goals were not met. Patient is being discharged due to lack of progress.  Thank you for the referral. Earlie Counts, PT 01/23/2015 9:42 AM  ?????      Shiya Fogelman,PT 01/23/2015, 9:42 AM  [facility] [address] [place], [place], [address] Phone: [phone]   Fax:  [phone]

## 2015-01-26 ENCOUNTER — Encounter: Payer: Self-pay | Admitting: Obstetrics and Gynecology

## 2015-01-27 ENCOUNTER — Ambulatory Visit
Admission: RE | Admit: 2015-01-27 | Discharge: 2015-01-27 | Disposition: A | Discharge status: Home / Self Care | Payer: BLUE CROSS/BLUE SHIELD | Source: Ambulatory Visit | Attending: Unknown Physician Specialty | Admitting: Unknown Physician Specialty

## 2015-01-27 ENCOUNTER — Other Ambulatory Visit: Payer: Self-pay | Admitting: Unknown Physician Specialty

## 2015-01-27 DIAGNOSIS — M542 Cervicalgia: Secondary | ICD-10-CM

## 2015-02-02 ENCOUNTER — Other Ambulatory Visit (HOSPITAL_COMMUNITY): Payer: Self-pay | Admitting: Family Medicine

## 2015-02-02 DIAGNOSIS — M5030 Other cervical disc degeneration, unspecified cervical region: Secondary | ICD-10-CM

## 2015-02-02 DIAGNOSIS — M5481 Occipital neuralgia: Secondary | ICD-10-CM

## 2015-02-06 ENCOUNTER — Ambulatory Visit
Admission: RE | Admit: 2015-02-06 | Discharge: 2015-02-06 | Disposition: A | Discharge status: Home / Self Care | Payer: BLUE CROSS/BLUE SHIELD | Source: Ambulatory Visit | Attending: Family Medicine | Admitting: Family Medicine

## 2015-02-06 DIAGNOSIS — M5481 Occipital neuralgia: Secondary | ICD-10-CM

## 2015-02-06 DIAGNOSIS — M5030 Other cervical disc degeneration, unspecified cervical region: Secondary | ICD-10-CM

## 2015-02-06 MED ORDER — GADOBENATE DIMEGLUMINE 529 MG/ML IV SOLN
15.0000 mL | Freq: Once | INTRAVENOUS | Status: AC | PRN
  Administered 2015-02-06: 15 mL via INTRAVENOUS

## 2015-02-15 ENCOUNTER — Other Ambulatory Visit: Payer: BLUE CROSS/BLUE SHIELD

## 2015-03-10 ENCOUNTER — Telehealth: Payer: Self-pay | Admitting: Obstetrics & Gynecology

## 2015-03-10 ENCOUNTER — Ambulatory Visit: Payer: BLUE CROSS/BLUE SHIELD | Admitting: Obstetrics & Gynecology

## 2015-03-10 NOTE — Telephone Encounter (Signed)
Patient canceled her appointment this morning at 8:30am "threw out her back". Patient will call to reschedule.

## 2015-03-20 NOTE — Telephone Encounter (Signed)
Patient has not called back to reschedule-do I need to call her or let her call back? Please advise.//kn

## 2015-03-20 NOTE — Telephone Encounter (Signed)
OK to close encounter. 

## 2015-06-13 ENCOUNTER — Other Ambulatory Visit: Payer: Self-pay | Admitting: Unknown Physician Specialty

## 2015-06-13 DIAGNOSIS — E041 Nontoxic single thyroid nodule: Secondary | ICD-10-CM

## 2015-06-27 ENCOUNTER — Ambulatory Visit
Admission: RE | Admit: 2015-06-27 | Discharge: 2015-06-27 | Disposition: A | Discharge status: Home / Self Care | Payer: BLUE CROSS/BLUE SHIELD | Source: Ambulatory Visit | Attending: Unknown Physician Specialty | Admitting: Unknown Physician Specialty

## 2015-06-27 DIAGNOSIS — E041 Nontoxic single thyroid nodule: Secondary | ICD-10-CM

## 2015-09-29 ENCOUNTER — Encounter (HOSPITAL_COMMUNITY): Payer: Self-pay | Admitting: Emergency Medicine

## 2015-09-29 ENCOUNTER — Encounter: Payer: Self-pay | Admitting: Internal Medicine

## 2015-09-29 ENCOUNTER — Emergency Department (HOSPITAL_COMMUNITY)
Admission: EM | Admit: 2015-09-29 | Discharge: 2015-09-29 | Disposition: A | Discharge status: Home / Self Care | Payer: BLUE CROSS/BLUE SHIELD | Attending: Emergency Medicine | Admitting: Emergency Medicine

## 2015-09-29 DIAGNOSIS — R42 Dizziness and giddiness: Secondary | ICD-10-CM | POA: Diagnosis present

## 2015-09-29 DIAGNOSIS — I471 Supraventricular tachycardia: Secondary | ICD-10-CM | POA: Insufficient documentation

## 2015-09-29 DIAGNOSIS — Z79899 Other long term (current) drug therapy: Secondary | ICD-10-CM | POA: Diagnosis not present

## 2015-09-29 DIAGNOSIS — Z8739 Personal history of other diseases of the musculoskeletal system and connective tissue: Secondary | ICD-10-CM | POA: Diagnosis not present

## 2015-09-29 DIAGNOSIS — Z8639 Personal history of other endocrine, nutritional and metabolic disease: Secondary | ICD-10-CM | POA: Diagnosis not present

## 2015-09-29 DIAGNOSIS — F329 Major depressive disorder, single episode, unspecified: Secondary | ICD-10-CM | POA: Diagnosis not present

## 2015-09-29 DIAGNOSIS — G4733 Obstructive sleep apnea (adult) (pediatric): Secondary | ICD-10-CM | POA: Diagnosis not present

## 2015-09-29 DIAGNOSIS — I10 Essential (primary) hypertension: Secondary | ICD-10-CM | POA: Diagnosis not present

## 2015-09-29 DIAGNOSIS — Z9981 Dependence on supplemental oxygen: Secondary | ICD-10-CM | POA: Diagnosis not present

## 2015-09-29 HISTORY — DX: Other intervertebral disc degeneration, lumbar region without mention of lumbar back pain or lower extremity pain: M51.369

## 2015-09-29 HISTORY — DX: Other intervertebral disc degeneration, lumbar region: M51.36

## 2015-09-29 LAB — CBC WITH DIFFERENTIAL/PLATELET
Basophils Absolute: 0 10*3/uL (ref 0.0–0.1)
Basophils Relative: 0 %
Eosinophils Absolute: 0.1 10*3/uL (ref 0.0–0.7)
Eosinophils Relative: 1 %
HCT: 45.4 % (ref 36.0–46.0)
Hemoglobin: 14.5 g/dL (ref 12.0–15.0)
Lymphocytes Relative: 21 %
Lymphs Abs: 2.2 10*3/uL (ref 0.7–4.0)
MCH: 27.4 pg (ref 26.0–34.0)
MCHC: 31.9 g/dL (ref 30.0–36.0)
MCV: 85.7 fL (ref 78.0–100.0)
Monocytes Absolute: 0.4 10*3/uL (ref 0.1–1.0)
Monocytes Relative: 4 %
Neutro Abs: 7.6 10*3/uL (ref 1.7–7.7)
Neutrophils Relative %: 74 %
Platelets: 257 10*3/uL (ref 150–400)
RBC: 5.3 MIL/uL — ABNORMAL HIGH (ref 3.87–5.11)
RDW: 13.8 % (ref 11.5–15.5)
WBC: 10.3 10*3/uL (ref 4.0–10.5)

## 2015-09-29 LAB — BASIC METABOLIC PANEL
Anion gap: 18 — ABNORMAL HIGH (ref 5–15)
BUN: 39 mg/dL — ABNORMAL HIGH (ref 6–20)
CO2: 21 mmol/L — ABNORMAL LOW (ref 22–32)
Calcium: 10.5 mg/dL — ABNORMAL HIGH (ref 8.9–10.3)
Chloride: 104 mmol/L (ref 101–111)
Creatinine, Ser: 1.66 mg/dL — ABNORMAL HIGH (ref 0.44–1.00)
GFR calc Af Amer: 38 mL/min — ABNORMAL LOW (ref >60)
GFR calc non Af Amer: 33 mL/min — ABNORMAL LOW (ref >60)
Glucose, Bld: 83 mg/dL (ref 65–99)
Potassium: 4.7 mmol/L (ref 3.5–5.1)
Sodium: 143 mmol/L (ref 135–145)

## 2015-09-29 LAB — TROPONIN I: Troponin I: 0.03 ng/mL (ref <0.031)

## 2015-09-29 LAB — PHOSPHORUS: Phosphorus: 3.7 mg/dL (ref 2.5–4.6)

## 2015-09-29 LAB — TSH: TSH: 2.623 u[IU]/mL (ref 0.350–4.500)

## 2015-09-29 LAB — MAGNESIUM: Magnesium: 1.9 mg/dL (ref 1.7–2.4)

## 2015-09-29 MED ORDER — SODIUM CHLORIDE 0.9 % IV BOLUS (SEPSIS)
1000.0000 mL | Freq: Once | INTRAVENOUS | Status: AC
  Administered 2015-09-29: 1000 mL via INTRAVENOUS

## 2015-09-29 MED ORDER — METOPROLOL SUCCINATE ER 25 MG PO TB24
12.5000 mg | ORAL_TABLET | Freq: Every day | ORAL | Status: DC
  Administered 2015-09-29: 12.5 mg via ORAL
  Filled 2015-09-29: qty 1

## 2015-09-29 MED ORDER — METOPROLOL SUCCINATE ER 25 MG PO TB24: 12.5000 mg | ORAL_TABLET | Freq: Every day | ORAL | Status: DC

## 2015-09-29 NOTE — ED Provider Notes (Signed)
CSN: [id number]     Arrival date & time 09/29/15  1502 History   First MD Initiated Contact with Patient 09/29/15 1507     Chief Complaint  Patient presents with  . Tachycardia     (Consider location/radiation/quality/duration/timing/severity/associated sxs/prior Treatment) HPI Patient had been shopping at [facility], she developed a feeling of racing heart without associated chest pain or dyspnea. She did feel lightheaded and near syncopal. She also reports she broke out in a sweat. She states he symptoms have occurred at other times but she has never gotten a diagnosis. She estimates over the past year, she sometimes has up to 3 episodes per week. He has never had a diagnosis. Patient also reports she has had a lot of extra stress recently. She reports she did not sleep last night and did not eat or drink anything today yet. She does report she ate well yesterday evening. She denies frequent caffeine use. She estimates about one ice tea or caffeinated beverage per day. No smoking use. No recent medication changes. Current medications are Vyvanse, Tribenzor and Brintellix. Past Medical History  Diagnosis Date  . Hypertension   . Depression   . Anxiety   . Hypercholesteremia   . OSA on CPAP 04/06/2013  . Daytime somnolence 04/06/2013  . Urinary incontinence   . DDD (degenerative disc disease), lumbar    Past Surgical History  Procedure Laterality Date  . Nasal septum surgery    . Tonsillectomy and adenoidectomy    . Radical hysterectomy  2000  . Knee arthroplasty Left   . Hand surgery Right    Family History  Problem Relation Age of Onset  . Diabetes Maternal Grandmother   . Heart disease Father   . Hypertension Father   . Stroke Father    Social History  Substance Use Topics  . Smoking status: Never Smoker   . Smokeless tobacco: None  . Alcohol Use: 0.0 oz/week    0 Standard drinks or equivalent per week   OB History    Gravida Para Term Preterm AB TAB SAB Ectopic Multiple  Living   2 2 2       4      Review of Systems  10 Systems reviewed and are negative for acute change except as noted in the HPI.   Allergies  Review of patient's allergies indicates no known allergies.  Home Medications   Prior to Admission medications   Medication Sig Start Date End Date Taking? Authorizing Provider  BRINTELLIX 20 MG TABS Take 20 mg by mouth daily. 08/21/14  Yes Historical Provider, MD  TRIBENZOR 40-10-25 MG TABS Take 1 tablet by mouth daily.  11/14/12  Yes Historical Provider, MD  VYVANSE 60 MG capsule Take 60 mg by mouth every morning. 10/21/14  Yes Historical Provider, MD  conjugated estrogens (PREMARIN) vaginal cream 1/2 gram vaginally twice weekly 11/28/14   Megan Salon, MD  metoprolol succinate (TOPROL-XL) 25 MG 24 hr tablet Take 0.5 tablets (12.5 mg total) by mouth daily. 09/29/15   Charlesetta Shanks, MD   BP 135/82 mmHg  Pulse 99  Temp(Src) 98.4 F (36.9 C) (Oral)  Resp 18  SpO2 98% Physical Exam  Constitutional: She is oriented to person, place, and time. She appears well-developed and well-nourished.  HENT:  Head: Normocephalic and atraumatic.  Eyes: EOM are normal. Pupils are equal, round, and reactive to light.  Neck: Neck supple.  Cardiovascular: Normal rate, regular rhythm, normal heart sounds and intact distal pulses.   Pulmonary/Chest: Effort normal  and breath sounds normal.  Abdominal: Soft. Bowel sounds are normal. She exhibits no distension. There is no tenderness.  Musculoskeletal: Normal range of motion. She exhibits no edema or tenderness.  Neurological: She is alert and oriented to person, place, and time. She has normal strength. No cranial nerve deficit. She exhibits normal muscle tone. Coordination normal. GCS eye subscore is 4. GCS verbal subscore is 5. GCS motor subscore is 6.  Skin: Skin is warm, dry and intact.  Psychiatric: She has a normal mood and affect.    ED Course  Procedures (including critical care time) Labs Review Labs  Reviewed  BASIC METABOLIC PANEL - Abnormal; Notable for the following:    CO2 21 (*)    BUN 39 (*)    Creatinine, Ser 1.66 (*)    Calcium 10.5 (*)    GFR calc non Af Amer 33 (*)    GFR calc Af Amer 38 (*)    Anion gap 18 (*)    All other components within normal limits  CBC WITH DIFFERENTIAL/PLATELET - Abnormal; Notable for the following:    RBC 5.30 (*)    All other components within normal limits  TSH  T3, FREE  TROPONIN I  MAGNESIUM  PHOSPHORUS    Imaging Review No results found. I have personally reviewed and evaluated these images and lab results as part of my medical decision-making.   EKG Interpretation   Date/Time:  Friday September 29 2015 15:08:53 EST Ventricular Rate:  101 PR Interval:  189 QRS Duration: 87 QT Interval:  358 QTC Calculation: 464 R Axis:   30 Text Interpretation:  Sinus tachycardia Probable left atrial enlargement  Nonspecific T abnormalities, lateral leads agree. no STEMI, NO CHANGE FROM  OLD. Confirmed by Johnney Killian, MD, Jeannie Done [phone]) on 09/29/2015 6:46:13 PM      MDM   Final diagnoses:  SVT (supraventricular tachycardia) ([place])   Patient's EMS EKG is consistent with SVT. This had resolved after initiation of IV. Diagnostic workup does not show elevated troponin or ischemic changes. Vital signs remained stable. At this time, patient will be started on Toprol with follow-up instructions provided. Return instructions for signs and symptoms have been reviewed.    Charlesetta Shanks, MD 10/07/15 0002

## 2015-09-29 NOTE — ED Notes (Signed)
Per [facility], pt at work around 1200, started feeling heart palpitations. EKG done at work which showed 145HR. Pt denies pain, was somewhat sob. EMS arrived, HR 160s and BP palpated at 90s, given 250ccs of fluid given by ems, HR now 100 and BP 130s/80. Pt in NAD, VSS, denies pain or any symptoms.

## 2015-09-29 NOTE — Discharge Instructions (Signed)
Paroxysmal Supraventricular Tachycardia Paroxysmal supraventricular tachycardia (PSVT) is a type of abnormal heart rhythm. It causes your heart to beat very quickly and then suddenly stop beating so quickly. A normal heart rate is 60-100 beats per minute. During an episode of PSVT, your heart rate may be 150-250 beats per minute. This can make you feel light-headed and short of breath. An episode of PSVT can be frightening. It is usually not dangerous. The heart has four chambers. All chambers need to work together for the heart to beat effectively. A normal heartbeat usually starts in the right upper chamber of the heart (atrium) when an area (sinoatrial node) puts out an electrical signal that spreads to the other chambers. People with PSVT may have abnormal electrical pathways, or they may have other areas in the upper chambers that send out electrical signals. The result is a very rapid heartbeat. When your heart beats very quickly, it does not have time to fill completely with blood. When PSVT happens often or it lasts for long periods, it can lead to heart weakness and failure. Most people with PSVT do not have any other heart disease. CAUSES Abnormal electrical activity in the heart causes PSVT. It is not known why some people get PSVT and others do not. RISK FACTORS You may be more likely to have PSVT if:  You are 20-30 years old.  You are a woman. Other factors that may increase your chances of an attack include:  Stress.  Being tired.  Smoking.  Stimulant drugs.  Alcoholic drinks.  Caffeine.  Pregnancy. SIGNS AND SYMPTOMS A mild episode of PSVT may cause no symptoms. If you do have signs and symptoms, they may include:  A pounding heart.  Feeling of skipped heartbeats (palpitations).  Weakness.  Shortness of breath.  Tightness or pain in your chest.  Light-headedness.  Anxiety.  Dizziness.  Sweating.  Nausea.  A fainting spell. DIAGNOSIS Your health care  provider may suspect PSVT if you have symptoms that come and go. The health care provider will do a physical exam. If you are having an episode during the exam, the health care provider may be able to diagnose PSVT by listening to your heart and feeling your pulse. Tests may also be done, including:  An electrical study of your heart (electrocardiogram, or ECG).  A test in which you wear a portable ECG monitor all day (Holter monitor) or for several days (event monitor).  A test that involves taking an image of your heart using sound waves (echocardiogram) to rule out other causes of a fast heart rate. TREATMENT You may not need treatment if episodes of PSVT do not happen often or if they do not cause symptoms. If PSVT episodes do cause symptoms, your health care provider may first suggest trying a self-treatment called vagus nerve stimulation. The vagus nerve extends down from the brain. It regulates certain body functions. Stimulating this nerve can slow down the heart. Your health care provider can teach you ways to do this. You may need to try a few ways to find what works best for you. Options include:  Holding your breath and pushing, as though you are having a bowel movement.  Massaging an area on one side of your neck below your jaw.  Bending forward with your head between your legs.  Bending forward with your head between your legs and coughing.  Massaging your eyeballs with your eyes closed. If vagus nerve stimulation does not work, other treatment options include:    Medicines to prevent an attack.  Being treated in the hospital with medicine or electric shock to stop an attack (cardioversion). This treatment can include:  Getting medicine through an IV line.  Having a small electric shock delivered to your heart. You will be given medicine to make you sleep through this procedure.  If you have frequent episodes with symptoms, you may need a procedure to get rid of the faulty  areas of your heart (radiofrequency ablation) and end the episodes of PSVT. In this procedure:  A long, thin tube (catheter) is passed through one of your veins into your heart.  Energy directed through the catheter eliminates the areas of your heart that are causing abnormal electric stimulation. HOME CARE INSTRUCTIONS  Take medicines only as directed by your health care provider.  Do not use caffeine in any form if caffeine triggers episodes of PSVT. Otherwise, consume caffeine in moderation. This means no more than a few cups of coffee or the equivalent each day.  Do not drink alcohol if alcohol triggers episodes of PSVT. Otherwise, limit alcohol intake to no more than 1 drink per day for nonpregnant women and 2 drinks per day for men. One drink equals 12 ounces of beer, 5 ounces of wine, or 1 ounces of hard liquor.  Do not use any tobacco products, including cigarettes, chewing tobacco, or electronic cigarettes. If you need help quitting, ask your health care provider.  Try to get at least 7 hours of sleep each night.  Find healthy ways to manage stress.  Perform vagus nerve stimulation as directed by your health care provider.  Maintain a healthy weight.  Get some exercise on most days. Ask your health care provider to suggest some good activities for you. SEEK MEDICAL CARE IF:  You are having episodes of PSVT more often, or they are lasting longer.  Vagus nerve stimulation is no longer helping.  You have new symptoms during an episode. SEEK IMMEDIATE MEDICAL CARE IF:  You have chest pain or trouble breathing.  You have an episode of PSVT that has lasted longer than 20 minutes.  You have passed out from an episode of PSVT. These symptoms may represent a serious problem that is an emergency. Do not wait to see if the symptoms will go away. Get medical help right away. Call your local emergency services (911 in the [place] Do not drive yourself to the hospital.   This  information is not intended to replace advice given to you by your health care provider. Make sure you discuss any questions you have with your health care provider.   Document Released: 07/15/2005 Document Revised: 08/05/2014 Document Reviewed: 12/23/2013 Elsevier Interactive Patient Education 2016 Elsevier Inc.  

## 2015-09-30 LAB — T3, FREE: T3, Free: 3.6 pg/mL (ref 2.0–4.4)

## 2015-10-21 ENCOUNTER — Ambulatory Visit (INDEPENDENT_AMBULATORY_CARE_PROVIDER_SITE_OTHER): Payer: BLUE CROSS/BLUE SHIELD

## 2015-10-21 ENCOUNTER — Ambulatory Visit (INDEPENDENT_AMBULATORY_CARE_PROVIDER_SITE_OTHER): Payer: BLUE CROSS/BLUE SHIELD | Admitting: Family Medicine

## 2015-10-21 VITALS — BP 118/58 | HR 62 | Temp 101.1°F | Resp 16 | Ht 65.0 in | Wt 178.0 lb

## 2015-10-21 DIAGNOSIS — I471 Supraventricular tachycardia: Secondary | ICD-10-CM | POA: Diagnosis not present

## 2015-10-21 DIAGNOSIS — R509 Fever, unspecified: Secondary | ICD-10-CM

## 2015-10-21 DIAGNOSIS — J111 Influenza due to unidentified influenza virus with other respiratory manifestations: Secondary | ICD-10-CM

## 2015-10-21 LAB — POCT CBC
Granulocyte percent: 73.8 %G (ref 37–80)
HCT, POC: 40.2 % (ref 37.7–47.9)
Hemoglobin: 13.6 g/dL (ref 12.2–16.2)
Lymph, poc: 1.4 (ref 0.6–3.4)
MCH, POC: 28.3 pg (ref 27–31.2)
MCHC: 33.9 g/dL (ref 31.8–35.4)
MCV: 83.4 fL (ref 80–97)
MID (cbc): 0.4 (ref 0–0.9)
MPV: 8.1 fL (ref 0–99.8)
POC Granulocyte: 5.2 (ref 2–6.9)
POC LYMPH PERCENT: 20.3 %L (ref 10–50)
POC MID %: 5.9 %M (ref 0–12)
Platelet Count, POC: 196 10*3/uL (ref 142–424)
RBC: 4.82 M/uL (ref 4.04–5.48)
RDW, POC: 13.3 %
WBC: 7 10*3/uL (ref 4.6–10.2)

## 2015-10-21 LAB — POCT SEDIMENTATION RATE: POCT SED RATE: 34 mm/hr — AB (ref 0–22)

## 2015-10-21 LAB — POCT INFLUENZA A/B
Influenza A, POC: POSITIVE — AB
Influenza B, POC: NEGATIVE

## 2015-10-21 MED ORDER — MUPIROCIN 2 % EX OINT: 1.0000 "application " | TOPICAL_OINTMENT | Freq: Three times a day (TID) | CUTANEOUS | Status: DC

## 2015-10-21 MED ORDER — HYDROCODONE-HOMATROPINE 5-1.5 MG/5ML PO SYRP: 5.0000 mL | ORAL_SOLUTION | Freq: Three times a day (TID) | ORAL | Status: DC | PRN

## 2015-10-21 MED ORDER — OSELTAMIVIR PHOSPHATE 75 MG PO CAPS: 75.0000 mg | ORAL_CAPSULE | Freq: Two times a day (BID) | ORAL | Status: DC

## 2015-10-21 NOTE — Progress Notes (Deleted)
   Subjective:    Patient ID: [name], female    DOB: [birth year], 60 y.o.   MRN: [id number]  HPI    Review of Systems     Objective:   Physical Exam        Assessment & Plan:

## 2015-10-21 NOTE — Progress Notes (Signed)
Subjective:  By signing my name below, I, Raven Small, attest that this documentation has been prepared under the direction and in the presence of Delman Cheadle, MD.  Electronically Signed: Thea Alken, ED Scribe. 10/21/2015. 1:29 PM.   Patient ID: [name], female    DOB: [birth year], 60 y.o.   MRN: [id number]  HPI Chief Complaint  Patient presents with  . Cough  . Fever    HPI Comments: [name] is a 60 y.o. female who presents to the [facility] complaining of cough and fever. Pt states symptoms started with a sudden deep cough 2 days ago. She reports symptoms worsened later that even with a fever of 102. She has been taking tylenol and advil but when she woke up this morning her fever returned. She took 81mg  aspirin this morning but no other medications. She has been drinking plenty of water. She denies sore throat and congestion. She denies sick contacts. She did not get flu shot this year. She is not a smoker. No hx of asthma. She denies hx of DVT. No recent travel   Pt states she was seen in the ED for palpitation and CP. She takes metoprolol half a tablet every day. She finds that when she does not take medication her HR will range around 150. She has been on vyvanse for 3-4 years. She has not taken medication since seen in the ED 3 weeks ago. She was referred to Dr. Everitt Amber but is unsure of her appointment at this time.   Pt states she developed an itchy area to the dorsum of her left hand. She applied neosporin but noticed that area on her hand now burns and formed a scab.   Lesly Dukes, MD  Patient Active Problem List   Diagnosis Date Noted  . Thyroid nodule 04/14/2013  . OSA on CPAP 04/06/2013  . Daytime somnolence 04/06/2013   Past Medical History  Diagnosis Date  . Hypertension   . Depression   . Anxiety   . Hypercholesteremia   . OSA on CPAP 04/06/2013  . Daytime somnolence 04/06/2013  . Urinary incontinence   . DDD (degenerative  disc disease), lumbar    Past Surgical History  Procedure Laterality Date  . Nasal septum surgery    . Tonsillectomy and adenoidectomy    . Radical hysterectomy  2000  . Knee arthroplasty Left   . Hand surgery Right    No Known Allergies Prior to Admission medications   Medication Sig Start Date End Date Taking? Authorizing Provider  BRINTELLIX 20 MG TABS Take 20 mg by mouth daily. 08/21/14  Yes Historical Provider, MD  hydrOXYzine (ATARAX/VISTARIL) 10 MG tablet Take 10 mg by mouth 3 (three) times daily as needed. prn   Yes Historical Provider, MD  metoprolol tartrate (LOPRESSOR) 25 MG tablet Take 25 mg by mouth 2 (two) times daily. 1/2 tablet in the morning   Yes Historical Provider, MD  TRIBENZOR 40-10-25 MG TABS Take 1 tablet by mouth daily.  11/14/12  Yes Historical Provider, MD  VYVANSE 60 MG capsule Take 60 mg by mouth every morning. 10/21/14  Yes Historical Provider, MD   Social History   Social History  . Marital Status: Married    Spouse Name: N/A  . Number of Children: N/A  . Years of Education: N/A   Occupational History  . Not on file.   Social History Main Topics  . Smoking status: Never Smoker   . Smokeless tobacco: Never  Used  . Alcohol Use: 0.0 oz/week    0 Standard drinks or equivalent per week  . Drug Use: No  . Sexual Activity:    Partners: Male    Birth Control/ Protection: Surgical     Comment: hysterectomy   Other Topics Concern  . Not on file   Social History Narrative   Depression screen [facility] 2/9 10/23/2015 10/21/2015  Decreased Interest 0 0  Down, Depressed, Hopeless 0 0  PHQ - 2 Score 0 0     Review of Systems  Constitutional: Positive for fever, chills, activity change, appetite change and fatigue.  HENT: Negative for congestion, sinus pressure and sore throat.   Respiratory: Positive for cough, chest tightness and shortness of breath.   Cardiovascular: Positive for chest pain and palpitations. Negative for leg swelling.  Musculoskeletal:  Positive for myalgias and neck pain. Negative for gait problem and neck stiffness.  Hematological: Positive for adenopathy.  Psychiatric/Behavioral: Positive for sleep disturbance. Negative for dysphoric mood.       Objective:   Physical Exam  Constitutional: She is oriented to person, place, and time. She appears well-developed and well-nourished. No distress.  HENT:  Head: Normocephalic and atraumatic.  Right Ear: Hearing, tympanic membrane, external ear and ear canal normal.  Left Ear: Hearing, tympanic membrane, external ear and ear canal normal.  Nose: Nose normal.  Mouth/Throat: Uvula is midline, oropharynx is clear and moist and mucous membranes are normal. No oropharyngeal exudate or posterior oropharyngeal erythema.  Eyes: Conjunctivae and EOM are normal.  Neck: Neck supple. No thyromegaly present.  Cardiovascular: Normal rate.  An irregular rhythm present.  Murmur ( left upper sternal border) heard.  Systolic murmur is present with a grade of 2/6  Pulmonary/Chest: Effort normal and breath sounds normal. She has no wheezes. She has no rales.  She has a barking hacking cough.  Musculoskeletal: Normal range of motion.  Lymphadenopathy:       Head (right side): Submandibular adenopathy present.       Head (left side): Submandibular adenopathy present.    She has cervical adenopathy ( anterior).  Neurological: She is alert and oriented to person, place, and time.  Skin: Skin is warm and dry.  2cm area of very light pink macule with some small excoriations appearing to be healing.   Psychiatric: She has a normal mood and affect. Her behavior is normal.  Nursing note and vitals reviewed.  2:26 PM- on recheck exam HR significantly decreased. 1/6 systolic murmur just at the sternal border.   Filed Vitals:   10/21/15 1250  BP: 118/58  Pulse: 62  Temp: 101.1 F (38.4 C)  TempSrc: Oral  Resp: 16  Height: 5\' 5"  (1.651 m)  Weight: 178 lb (80.74 kg)  SpO2: 96%    Results for  orders placed or performed in visit on 10/21/15  POCT Influenza A/B  Result Value Ref Range   Influenza A, POC Positive (A) Negative   Influenza B, POC Negative Negative  POCT CBC  Result Value Ref Range   WBC 7.0 4.6 - 10.2 K/uL   Lymph, poc 1.4 0.6 - 3.4   POC LYMPH PERCENT 20.3 10 - 50 %L   MID (cbc) 0.4 0 - 0.9   POC MID % 5.9 0 - 12 %M   POC Granulocyte 5.2 2 - 6.9   Granulocyte percent 73.8 37 - 80 %G   RBC 4.82 4.04 - 5.48 M/uL   Hemoglobin 13.6 12.2 - 16.2 g/dL   HCT, POC  40.2 37.7 - 47.9 %   MCV 83.4 80 - 97 fL   MCH, POC 28.3 27 - 31.2 pg   MCHC 33.9 31.8 - 35.4 g/dL   RDW, POC 13.3 %   Platelet Count, POC 196 142 - 424 K/uL   MPV 8.1 0 - 99.8 fL   Dg Chest 2 View  10/21/2015  CLINICAL DATA:  Cough with murmur and cardiac  arrhythmia. EXAM: CHEST  2 VIEW COMPARISON:  10/30/2007. FINDINGS: Cardiomegaly. No active infiltrates or failure. Scoliosis convex LEFT upper thoracic region. Osteopenia. Similar appearance to priors. IMPRESSION: No active cardiopulmonary disease. Electronically Signed   By: Staci Righter M.D.   On: 10/21/2015 14:35   EKG- sinus rhythm. inverted p waves medial chest leads. No ischemic changes.   Assessment & Plan:    1. Fever, unspecified   2. Paroxysmal supraventricular tachycardia ([place])   3. Influenza with respiratory manifestation     Orders Placed This Encounter  Procedures  . DG Chest 2 View    Standing Status: Future     Number of Occurrences: 1     Standing Expiration Date: 10/20/2016    Order Specific Question:  Reason for Exam (SYMPTOM  OR DIAGNOSIS REQUIRED)    Answer:  new cough, new arrhythmia and murmur    Order Specific Question:  Is the patient pregnant?    Answer:  No    Order Specific Question:  Preferred imaging location?    Answer:  External  . POCT Influenza A/B  . POCT CBC  . POCT SEDIMENTATION RATE  . EKG 12-Lead    Meds ordered this encounter  Medications  . DISCONTD: metoprolol tartrate (LOPRESSOR) 25 MG  tablet    Sig: Take 25 mg by mouth 2 (two) times daily. 1/2 tablet in the morning  . hydrOXYzine (ATARAX/VISTARIL) 10 MG tablet    Sig: Take 10 mg by mouth 3 (three) times daily as needed. prn  . oseltamivir (TAMIFLU) 75 MG capsule    Sig: Take 1 capsule (75 mg total) by mouth 2 (two) times daily.    Dispense:  10 capsule    Refill:  0  . HYDROcodone-homatropine (HYCODAN) 5-1.5 MG/5ML syrup    Sig: Take 5 mLs by mouth every 8 (eight) hours as needed for cough.    Dispense:  90 mL    Refill:  0  . mupirocin ointment (BACTROBAN) 2 %    Sig: Apply 1 application topically 3 (three) times daily.    Dispense:  22 g    Refill:  0    I personally performed the services described in this documentation, which was scribed in my presence. The recorded information has been reviewed and considered, and addended by me as needed.  Delman Cheadle, MD MPH

## 2015-10-21 NOTE — Patient Instructions (Signed)
Dg Chest 2 View  10/21/2015  CLINICAL DATA:  Cough with murmur and cardiac  arrhythmia. EXAM: CHEST  2 VIEW COMPARISON:  10/30/2007. FINDINGS: Cardiomegaly. No active infiltrates or failure. Scoliosis convex LEFT upper thoracic region. Osteopenia. Similar appearance to priors. IMPRESSION: No active cardiopulmonary disease. Electronically Signed   By: Staci Righter M.D.   On: 10/21/2015 14:35    Recheck in 48 hours, sooner if worse.  IF you received an x-ray today, you will receive an invoice from [facility]. Please contact [facility] at [phone] with questions or concerns regarding your invoice.   IF you received labwork today, you will receive an invoice from [employer]. Please contact [facility] at [phone] with questions or concerns regarding your invoice.   Our billing staff will not be able to assist you with questions regarding bills from these companies.  You will be contacted with the lab results as soon as they are available. The fastest way to get your results is to activate your My Chart account. Instructions are located on the last page of this paperwork. If you have not heard from [name] regarding the results in 2 weeks, please contact this office.      Influenza, Adult Influenza ("the flu") is a viral infection of the respiratory tract. It occurs more often in winter months because people spend more time in close contact with one another. Influenza can make you feel very sick. Influenza easily spreads from person to person (contagious). CAUSES  Influenza is caused by a virus that infects the respiratory tract. You can catch the virus by breathing in droplets from an infected person's cough or sneeze. You can also catch the virus by touching something that was recently contaminated with the virus and then touching your mouth, nose, or eyes. RISKS AND COMPLICATIONS You may be at risk for a more severe case of influenza if you smoke  cigarettes, have diabetes, have chronic heart disease (such as heart failure) or lung disease (such as asthma), or if you have a weakened immune system. Elderly people and pregnant women are also at risk for more serious infections. The most common problem of influenza is a lung infection (pneumonia). Sometimes, this problem can require emergency medical care and may be life threatening. SIGNS AND SYMPTOMS  Symptoms typically last 4 to 10 days and may include:  Fever.  Chills.  Headache, body aches, and muscle aches.  Sore throat.  Chest discomfort and cough.  Poor appetite.  Weakness or feeling tired.  Dizziness.  Nausea or vomiting. DIAGNOSIS  Diagnosis of influenza is often made based on your history and a physical exam. A nose or throat swab test can be done to confirm the diagnosis. TREATMENT  In mild cases, influenza goes away on its own. Treatment is directed at relieving symptoms. For more severe cases, your health care provider may prescribe antiviral medicines to shorten the sickness. Antibiotic medicines are not effective because the infection is caused by a virus, not by bacteria. HOME CARE INSTRUCTIONS  Take medicines only as directed by your health care provider.  Use a cool mist humidifier to make breathing easier.  Get plenty of rest until your temperature returns to normal. This usually takes 3 to 4 days.  Drink enough fluid to keep your urine clear or pale yellow.  Cover yourmouth and nosewhen coughing or sneezing,and wash your handswellto prevent thevirusfrom spreading.  Stay homefromwork orschool untilthe fever is gonefor at least 34full day. PREVENTION  An annual influenza vaccination (  flu shot) is the best way to avoid getting influenza. An annual flu shot is now routinely recommended for all adults in the [place] IF:  You experiencechest pain, yourcough worsens,or you producemore mucus.  Youhave nausea,vomiting,  ordiarrhea.  Your fever returns or gets worse. SEEK IMMEDIATE MEDICAL CARE IF:  You havetrouble breathing, you become short of breath,or your skin ornails becomebluish.  You have severe painor stiffnessin the neck.  You develop a sudden headache, or pain in the face or ear.  You have nausea or vomiting that you cannot control. MAKE SURE YOU:   Understand these instructions.  Will watch your condition.  Will get help right away if you are not doing well or get worse.   This information is not intended to replace advice given to you by your health care provider. Make sure you discuss any questions you have with your health care provider.   Document Released: 07/12/2000 Document Revised: 08/05/2014 Document Reviewed: 10/14/2011 Elsevier Interactive Patient Education [employer].

## 2015-10-23 ENCOUNTER — Ambulatory Visit (INDEPENDENT_AMBULATORY_CARE_PROVIDER_SITE_OTHER): Payer: BLUE CROSS/BLUE SHIELD | Admitting: Family Medicine

## 2015-10-23 VITALS — BP 118/82 | HR 61 | Temp 99.4°F | Resp 16 | Ht 65.0 in | Wt 178.8 lb

## 2015-10-23 DIAGNOSIS — I471 Supraventricular tachycardia, unspecified: Secondary | ICD-10-CM

## 2015-10-23 DIAGNOSIS — J111 Influenza due to unidentified influenza virus with other respiratory manifestations: Secondary | ICD-10-CM

## 2015-10-23 MED ORDER — METOPROLOL TARTRATE 25 MG PO TABS: 12.5000 mg | ORAL_TABLET | Freq: Two times a day (BID) | ORAL | Status: DC | PRN

## 2015-10-23 NOTE — Patient Instructions (Addendum)
   IF you received an x-ray today, you will receive an invoice from [facility]. Please contact [facility] at [phone] with questions or concerns regarding your invoice.   IF you received labwork today, you will receive an invoice from [facility]. Please contact [facility] at [phone] with questions or concerns regarding your invoice.   Our billing staff will not be able to assist you with questions regarding bills from these companies.  You will be contacted with the lab results as soon as they are available. The fastest way to get your results is to activate your My Chart account. Instructions are located on the last page of this paperwork. If you have not heard from us regarding the results in 2 weeks, please contact this office.     Influenza, Adult Influenza ("the flu") is a viral infection of the respiratory tract. It occurs more often in winter months because people spend more time in close contact with one another. Influenza can make you feel very sick. Influenza easily spreads from person to person (contagious). CAUSES  Influenza is caused by a virus that infects the respiratory tract. You can catch the virus by breathing in droplets from an infected person's cough or sneeze. You can also catch the virus by touching something that was recently contaminated with the virus and then touching your mouth, nose, or eyes. RISKS AND COMPLICATIONS You may be at risk for a more severe case of influenza if you smoke cigarettes, have diabetes, have chronic heart disease (such as heart failure) or lung disease (such as asthma), or if you have a weakened immune system. Elderly people and pregnant women are also at risk for more serious infections. The most common problem of influenza is a lung infection (pneumonia). Sometimes, this problem can require emergency medical care and may be life threatening. SIGNS AND SYMPTOMS  Symptoms typically last 4  to 10 days and may include:  Fever.  Chills.  Headache, body aches, and muscle aches.  Sore throat.  Chest discomfort and cough.  Poor appetite.  Weakness or feeling tired.  Dizziness.  Nausea or vomiting. DIAGNOSIS  Diagnosis of influenza is often made based on your history and a physical exam. A nose or throat swab test can be done to confirm the diagnosis. TREATMENT  In mild cases, influenza goes away on its own. Treatment is directed at relieving symptoms. For more severe cases, your health care provider may prescribe antiviral medicines to shorten the sickness. Antibiotic medicines are not effective because the infection is caused by a virus, not by bacteria. HOME CARE INSTRUCTIONS  Take medicines only as directed by your health care provider.  Use a cool mist humidifier to make breathing easier.  Get plenty of rest until your temperature returns to normal. This usually takes 3 to 4 days.  Drink enough fluid to keep your urine clear or pale yellow.  Cover yourmouth and nosewhen coughing or sneezing,and wash your handswellto prevent thevirusfrom spreading.  Stay homefromwork orschool untilthe fever is gonefor at least 1full day. PREVENTION  An annual influenza vaccination (flu shot) is the best way to avoid getting influenza. An annual flu shot is now routinely recommended for all adults in the [place] SEEK MEDICAL CARE IF:  You experiencechest pain, yourcough worsens,or you producemore mucus.  Youhave nausea,vomiting, ordiarrhea.  Your fever returns or gets worse. SEEK IMMEDIATE MEDICAL CARE IF:  You havetrouble breathing, you become short of breath,or your skin ornails becomebluish.  You have severe painor   stiffnessin the neck.  You develop a sudden headache, or pain in the face or ear.  You have nausea or vomiting that you cannot control. MAKE SURE YOU:   Understand these instructions.  Will watch your condition.  Will get help  right away if you are not doing well or get worse.   This information is not intended to replace advice given to you by your health care provider. Make sure you discuss any questions you have with your health care provider.   Document Released: 07/12/2000 Document Revised: 08/05/2014 Document Reviewed: 10/14/2011 Elsevier Interactive Patient Education 2016 Elsevier Inc.  Nonspecific Tachycardia Tachycardia is a faster than normal heartbeat (more than 100 beats per minute). In adults, the heart normally beats between 60 and 100 times a minute. A fast heartbeat may be a normal response to exercise or stress. It does not necessarily mean that something is wrong. However, sometimes when your heart beats too fast it may not be able to pump enough blood to the rest of your body. This can result in chest pain, shortness of breath, dizziness, and even fainting. Nonspecific tachycardia means that the specific cause or pattern of your tachycardia is unknown. CAUSES  Tachycardia may be harmless or it may be due to a more serious underlying cause. Possible causes of tachycardia include:  Exercise or exertion.  Fever.  Pain or injury.  Infection.  Loss of body fluids (dehydration).  Overactive thyroid.  Lack of red blood cells (anemia).  Anxiety and stress.  Alcohol.  Caffeine.  Tobacco products.  Diet pills.  Illegal drugs.  Heart disease. SYMPTOMS  Rapid or irregular heartbeat (palpitations).  Suddenly feeling your heart beating (cardiac awareness).  Dizziness.  Tiredness (fatigue).  Shortness of breath.  Chest pain.  Nausea.  Fainting. DIAGNOSIS  Your caregiver will perform a physical exam and take your medical history. In some cases, a heart specialist (cardiologist) may be consulted. Your caregiver may also order:  Blood tests.  Electrocardiography. This test records the electrical activity of your heart.  A heart monitoring test. TREATMENT  Treatment will  depend on the likely cause of your tachycardia. The goal is to treat the underlying cause of your tachycardia. Treatment methods may include:  Replacement of fluids or blood through an intravenous (IV) tube for moderate to severe dehydration or anemia.  New medicines or changes in your current medicines.  Diet and lifestyle changes.  Treatment for certain infections.  Stress relief or relaxation methods. HOME CARE INSTRUCTIONS   Rest.  Drink enough fluids to keep your urine clear or pale yellow.  Do not smoke.  Avoid:  Caffeine.  Tobacco.  Alcohol.  Chocolate.  Stimulants such as over-the-counter diet pills or pills that help you stay awake.  Situations that cause anxiety or stress.  Illegal drugs such as marijuana, phencyclidine (PCP), and cocaine.  Only take medicine as directed by your caregiver.  Keep all follow-up appointments as directed by your caregiver. SEEK IMMEDIATE MEDICAL CARE IF:   You have pain in your chest, upper arms, jaw, or neck.  You become weak, dizzy, or feel faint.  You have palpitations that will not go away.  You vomit, have diarrhea, or pass blood in your stool.  Your skin is cool, pale, and wet.  You have a fever that will not go away with rest, fluids, and medicine. MAKE SURE YOU:   Understand these instructions.  Will watch your condition.  Will get help right away if you are not doing well  or get worse.   This information is not intended to replace advice given to you by your health care provider. Make sure you discuss any questions you have with your health care provider.   Document Released: 08/22/2004 Document Revised: 10/07/2011 Document Reviewed: 01/27/2015 Elsevier Interactive Patient Education [employer].

## 2015-10-23 NOTE — Progress Notes (Signed)
Subjective:    Patient ID: [name], female    DOB: [birth year], 60 y.o.   MRN: [id number] Chief Complaint  Patient presents with  . Follow-up    from last visit said she still is not better    HPI  Pt is here in f/u from her OV 2d prior.  Pt was found to have influenza at that visit - she had had  fever and a deep cough x 2d so was started on tamiflu.   Pt does have a recent development of atypical tachycardic episodes up to 150 bpm - cardiology consult with Dr. Einar Gip is sched in 3d.  Had resolved after she was started on toprol 12.5 from the ER but over the past 2d she had had several episodes up to 130-140.  No orthostatic sxs. Really pushing fluids.  Her BP is normal/unchanged from baseline about 112/80 - even during these episodes. Her heart started racing.  She is feeling sluggish and tired.  She is still coughing a lot and has not been using any otc or rx meds for sxs - I had rxed her hycodan for cough suppression and she just got it filled today (as several pharmacies she tried were out of stock) so hasn't tried it yet - Her father finally found it at [employer].  She has had severe sweats the evening after she had it and again last night.  She has been really pushing fluids.   Past Medical History  Diagnosis Date  . Hypertension   . Depression   . Anxiety   . Hypercholesteremia   . OSA on CPAP 04/06/2013  . Daytime somnolence 04/06/2013  . Urinary incontinence   . DDD (degenerative disc disease), lumbar    Past Surgical History  Procedure Laterality Date  . Nasal septum surgery    . Tonsillectomy and adenoidectomy    . Radical hysterectomy  2000  . Knee arthroplasty Left   . Hand surgery Right    Current Outpatient Prescriptions on File Prior to Visit  Medication Sig Dispense Refill  . BRINTELLIX 20 MG TABS Take 20 mg by mouth daily.  3  . HYDROcodone-homatropine (HYCODAN) 5-1.5 MG/5ML syrup Take 5 mLs by mouth every 8 (eight) hours as needed for cough. 90 mL 0  .  hydrOXYzine (ATARAX/VISTARIL) 10 MG tablet Take 10 mg by mouth 3 (three) times daily as needed. prn    . mupirocin ointment (BACTROBAN) 2 % Apply 1 application topically 3 (three) times daily. 22 g 0  . oseltamivir (TAMIFLU) 75 MG capsule Take 1 capsule (75 mg total) by mouth 2 (two) times daily. 10 capsule 0  . TRIBENZOR 40-10-25 MG TABS Take 1 tablet by mouth daily.     Marland Kitchen VYVANSE 60 MG capsule Take 60 mg by mouth every morning.  0   No current facility-administered medications on file prior to visit.   No Known Allergies Family History  Problem Relation Age of Onset  . Diabetes Maternal Grandmother   . Heart disease Father   . Hypertension Father   . Stroke Father    Social History   Social History  . Marital Status: Married    Spouse Name: N/A  . Number of Children: N/A  . Years of Education: N/A   Social History Main Topics  . Smoking status: Never Smoker   . Smokeless tobacco: Never Used  . Alcohol Use: 0.0 oz/week    0 Standard drinks or equivalent per week  . Drug Use: No  .  Sexual Activity:    Partners: Male    Patent examiner Protection: Surgical     Comment: hysterectomy   Other Topics Concern  . None   Social History Narrative      Review of Systems  Constitutional: Positive for diaphoresis, activity change, appetite change and fatigue. Negative for fever and chills.  HENT: Positive for congestion, postnasal drip and rhinorrhea. Negative for ear discharge, ear pain, mouth sores, nosebleeds, sinus pressure, sneezing, sore throat, trouble swallowing and voice change.   Eyes: Negative for photophobia and pain.  Respiratory: Positive for cough and wheezing.   Cardiovascular: Positive for palpitations. Negative for chest pain.  Gastrointestinal: Positive for nausea. Negative for vomiting and abdominal pain.  Genitourinary: Negative for dysuria.  Musculoskeletal: Positive for myalgias. Negative for joint swelling, gait problem and neck stiffness.    Allergic/Immunologic: Negative for immunocompromised state.  Neurological: Positive for weakness and headaches. Negative for dizziness, syncope and light-headedness.  Psychiatric/Behavioral: Positive for sleep disturbance.       Objective:  BP 118/82 mmHg  Pulse 61  Temp(Src) 99.4 F (37.4 C) (Oral)  Resp 16  Ht 5\' 5"  (1.651 m)  Wt 178 lb 12.8 oz (81.103 kg)  BMI 29.75 kg/m2  SpO2 96%  Physical Exam  Constitutional: She is oriented to person, place, and time. She appears well-developed and well-nourished. No distress.  HENT:  Head: Normocephalic and atraumatic.  Right Ear: Tympanic membrane, external ear and ear canal normal.  Left Ear: Tympanic membrane, external ear and ear canal normal.  Nose: Nose normal. No mucosal edema or rhinorrhea.  Mouth/Throat: Uvula is midline, oropharynx is clear and moist and mucous membranes are normal. No oropharyngeal exudate.  Eyes: Conjunctivae are normal. Right eye exhibits no discharge. Left eye exhibits no discharge. No scleral icterus.  Neck: Normal range of motion. Neck supple.  Cardiovascular: Normal rate, regular rhythm, normal heart sounds and intact distal pulses.   Pulmonary/Chest: Effort normal and breath sounds normal.  Abdominal: Soft. Bowel sounds are normal. She exhibits no distension and no mass. There is no tenderness. There is no rebound and no guarding.  Lymphadenopathy:    She has no cervical adenopathy.  Neurological: She is alert and oriented to person, place, and time.  Skin: Skin is warm and dry. She is not diaphoretic. No erythema.  Psychiatric: She has a normal mood and affect. Her behavior is normal.          Assessment & Plan:   1. Influenza with respiratory manifestation   2. Paroxysmal supraventricular tachycardia ([place])   Pt's exam is improving, pt reassured that I suspect she will start noticing sx improvement in the next day to 2, keep tylenol on board for the next 2d to prevent diaphoresis and fever  which may be contributing to her breakthrough tachycardia.  Cont toprol 12.5 mg qd and can try 12.5mg  of lopressor prn for HR >100. Has initially cardiology c/s for tachycardia in 3d w/ Dr. Einar Gip.   Meds ordered this encounter  Medications  . metoprolol tartrate (LOPRESSOR) 25 MG tablet    Sig: Take 0.5 tablets (12.5 mg total) by mouth 2 (two) times daily as needed.    Dispense:  30 tablet    Refill:  0    Delman Cheadle, MD MPH

## 2015-10-31 DIAGNOSIS — F411 Generalized anxiety disorder: Secondary | ICD-10-CM | POA: Diagnosis not present

## 2015-11-02 ENCOUNTER — Telehealth: Payer: Self-pay | Admitting: Neurology

## 2015-11-02 NOTE — Telephone Encounter (Signed)
error 

## 2015-11-20 DIAGNOSIS — F39 Unspecified mood [affective] disorder: Secondary | ICD-10-CM | POA: Diagnosis not present

## 2015-12-05 ENCOUNTER — Encounter: Payer: Self-pay | Admitting: Internal Medicine

## 2015-12-05 ENCOUNTER — Ambulatory Visit (INDEPENDENT_AMBULATORY_CARE_PROVIDER_SITE_OTHER): Payer: BLUE CROSS/BLUE SHIELD | Admitting: Internal Medicine

## 2015-12-05 VITALS — BP 150/84 | HR 41 | Ht 65.0 in | Wt 183.0 lb

## 2015-12-05 DIAGNOSIS — I471 Supraventricular tachycardia: Secondary | ICD-10-CM

## 2015-12-05 NOTE — Progress Notes (Signed)
HPI [name] is referred today by Dr. Einar Gip for evaluation of SVT. She is a pleasant 60 yo woman with HTN and anxiety who was found to have SVT at 160/min several weeks ago. In the interim she has been placed on beta blocker therapy. She has continued to have short lived SVT and fatigue with the beta blockers. She has not had frank syncope but she has had near syncope and sob with her SVT.   No Known Allergies   Current Outpatient Prescriptions  Medication Sig Dispense Refill  . lisdexamfetamine (VYVANSE) 40 MG capsule Take 40 mg by mouth every morning.    . metoprolol succinate (TOPROL-XL) 25 MG 24 hr tablet Take 12.5 mg by mouth daily.    . Vortioxetine HBr (TRINTELLIX) 20 MG TABS Take 20 mg by mouth daily.     No current facility-administered medications for this visit.     Past Medical History  Diagnosis Date  . Hypertension   . Depression   . Anxiety   . Hypercholesteremia   . OSA on CPAP 04/06/2013  . Daytime somnolence 04/06/2013  . Urinary incontinence   . DDD (degenerative disc disease), lumbar     ROS:   All systems reviewed and negative except as noted in the HPI.   Past Surgical History  Procedure Laterality Date  . Nasal septum surgery    . Tonsillectomy and adenoidectomy    . Radical hysterectomy  2000  . Knee arthroplasty Left   . Hand surgery Right      Family History  Problem Relation Age of Onset  . Diabetes Maternal Grandmother   . Heart disease Father   . Hypertension Father   . Stroke Father      Social History   Social History  . Marital Status: Married    Spouse Name: N/A  . Number of Children: N/A  . Years of Education: N/A   Occupational History  . Not on file.   Social History Main Topics  . Smoking status: Never Smoker   . Smokeless tobacco: Never Used  . Alcohol Use: 0.0 oz/week    0 Standard drinks or equivalent per week  . Drug Use: No  . Sexual Activity:    Partners: Male    Birth Control/ Protection:  Surgical     Comment: hysterectomy   Other Topics Concern  . Not on file   Social History Narrative     BP 150/84 mmHg  Pulse 41  Ht 5\' 5"  (1.651 m)  Wt 183 lb (83.008 kg)  BMI 30.45 kg/m2  Physical Exam:  Well appearing NAD HEENT: Unremarkable Neck:  No JVD, no thyromegally Lymphatics:  No adenopathy Back:  No CVA tenderness Lungs:  Clear HEART:  Regular rate rhythm, no murmurs, no rubs, no clicks Abd:  soft, positive bowel sounds, no organomegally, no rebound, no guarding Ext:  2 plus pulses, no edema, no cyanosis, no clubbing Skin:  No rashes no nodules Neuro:  CN II through XII intact, motor grossly intact  EKG - ECG during SVT demonstrates a short RP tachycardia at 160/min   ECG today demonstrates sinus bradycardia at 41/min  Assess/Plan: 1. SVT - I have discussed the treatment options with the patient. She appears to be tolerating her beta blocker but does note symptoms have persisted. We discussed watchful waiting, uptitration of her beta blocker or catheter ablation. She will call us if she wishes to proceed with ablation. 2. HTN - Her blood pressure is  elevated. She states that it is much better when she is not in the doctor's office. 3. Sinus bradycardia - her HR is slow though she is minimally symptomatic. She will not be able to uptitrate her beta blocker.  Mikle Bosworth.D.

## 2015-12-05 NOTE — Patient Instructions (Signed)
Medication Instructions:  Your physician recommends that you continue on your current medications as directed. Please refer to the Current Medication list given to you today.   Labwork: None ordered   Testing/Procedures: Your physician has recommended that you have an ablation. Catheter ablation is a medical procedure used to treat some cardiac arrhythmias (irregular heartbeats). During catheter ablation, a long, thin, flexible tube is put into a blood vessel in your groin (upper thigh), or neck. This tube is called an ablation catheter. It is then guided to your heart through the blood vessel. Radio frequency waves destroy small areas of heart tissue where abnormal heartbeats may cause an arrhythmia to start. Please see the instruction sheet given to you today.  Call if you decide to proceed   Follow-Up: Your physician recommends that you schedule a follow-up appointment as needed   Any Other Special Instructions Will Be Listed Below (If Applicable).     If you need a refill on your cardiac medications before your next appointment, please call your pharmacy.

## 2015-12-12 ENCOUNTER — Telehealth: Payer: Self-pay

## 2015-12-12 NOTE — Telephone Encounter (Signed)
LM to bring in CPAP or SD card to appt tomorrow.

## 2015-12-13 ENCOUNTER — Ambulatory Visit (INDEPENDENT_AMBULATORY_CARE_PROVIDER_SITE_OTHER): Payer: BLUE CROSS/BLUE SHIELD | Admitting: Neurology

## 2015-12-13 ENCOUNTER — Telehealth: Payer: Self-pay | Admitting: Internal Medicine

## 2015-12-13 ENCOUNTER — Encounter: Payer: Self-pay | Admitting: Neurology

## 2015-12-13 VITALS — BP 162/80 | HR 60 | Resp 16 | Ht 65.0 in | Wt 182.0 lb

## 2015-12-13 DIAGNOSIS — R635 Abnormal weight gain: Secondary | ICD-10-CM

## 2015-12-13 DIAGNOSIS — I471 Supraventricular tachycardia: Secondary | ICD-10-CM | POA: Diagnosis not present

## 2015-12-13 DIAGNOSIS — R351 Nocturia: Secondary | ICD-10-CM | POA: Diagnosis not present

## 2015-12-13 DIAGNOSIS — G4710 Hypersomnia, unspecified: Secondary | ICD-10-CM | POA: Diagnosis not present

## 2015-12-13 DIAGNOSIS — G4733 Obstructive sleep apnea (adult) (pediatric): Secondary | ICD-10-CM | POA: Diagnosis not present

## 2015-12-13 NOTE — Patient Instructions (Signed)
Based on your symptoms and your exam I believe you may have worse obstructive sleep apnea or OSA, and I think we should proceed with a CPAP titration sleep study. Please remember, the risks and ramifications of moderate to severe obstructive sleep apnea or OSA are: Cardiovascular disease, including congestive heart failure, stroke, difficult to control hypertension, arrhythmias, and even type 2 diabetes has been linked to untreated OSA. Sleep apnea causes disruption of sleep and sleep deprivation in most cases, which, in turn, can cause recurrent headaches, problems with memory, mood, concentration, focus, and vigilance. Most people with untreated sleep apnea report excessive daytime sleepiness, which can affect their ability to drive. Please do not drive if you feel sleepy.   I will likely see you back after your sleep study to go over the test results and where to go from there. We will call you after your sleep study to advise about the results (most likely, you will hear from Beverlee Nims, my nurse) and to set up an appointment at the time, as necessary.    Our sleep lab administrative assistant, Arrie Aran will meet with you or call you to schedule your sleep study. If you don't hear back from her by next week please feel free to call her at [phone]. This is her direct line and please leave a message with your phone number to call back if you get the voicemail box. She will call back as soon as possible.

## 2015-12-13 NOTE — Progress Notes (Signed)
Subjective:    Patient ID: [name] is a 60 y.o. female.  HPI     Interim history:   [name] is a very pleasant 59 year old right-handed woman with an underlying medical history of hypertension, OCD and mood disorder who presents for reevaluation of her prior diagnosis of obstructive sleep apnea. She is referred by her cardiologist. I last saw her on 04/06/2013 after her recent sleep studies. She was originally referred to me by her psychiatrist in May 2014. She had a baseline sleep study and a CPAP titration study. Based on her test results are prescribed CPAP therapy for home use and last time I saw her in September 2014, she was compliant with treatment.   Today, 12/13/2015: I could not get a download from her CPAP machine, this is an older machine which we cannot download. She indicates that she used CPAP for perhaps a year. She stopped couple of years ago. She did not feel much improved with her sleep when she used it. She tried one time about 6 months ago. In March she developed SVT and saw Dr. Einar Gip, after going to the emergency room. She has seen Dr. Lovena Le and is in the process of scheduling an ablation procedure. She also has suffered from low back pain and has not been able to exercise. She had some interim medication changes through her psychiatrist. She no longer is on Depakote, or Lamictal. She is now on Vyvanse and Trintellix. She has gained weight since her original sleep study, about 15 pounds. She is more tired during the day. She has nocturia about once per night. She drinks caffeine, about 1 drink per day.  Previously:   04/06/2013: She presents for follow-up consultation after a recent set of sleep studies. I had originally met her at the request of her psychiatrist on 12/10/2012, at which time she reported a prior sleep study some 15 years ago but no diagnosis of obstructive sleep apnea. She has recently been experiencing worsening daytime somnolence and snoring.  Her baseline sleep study and her CPAP titration study were discussed. Her baseline sleep study was from 12/24/2012 and demonstrated a total AHI of 11 per hour, supine AHI of 23.3 per hour and a REM related AHI of 30 per hour with an oxyhemoglobin desaturation nadir of 81%. Based on those test results I requested that she come back for a CPAP titration study which she had on 01/14/2013. Sleep efficiency was reduced at 75.7% with a normal sleep latency. Wake after sleep onset was increased at 89 minutes with mild sleep fragmentation noted. Her arousal index was low. Her sleep architecture showed a normal percentage of stage I and stage II sleep, and he increased percentage of slow-wave sleep at 34.4% and a mildly decreased percentage of REM sleep at 14% with a normal REM latency. She had no significant periodic leg movements. She had CPAP from 5 cm to 6 cm of pressure and had almost complete resolution of her sleep disordered breathing on 6 cm of water pressure with a medium nasal pillows mask. I reviewed compliance data from 03/01/2013 through 03/16/2013 which is a total of 15 days during which time she used it every day except for one day. Her percent use stays greater than 4 hours was 88% with an average usage for all days of 6 hours and 41 minutes. Her residual AHI was 6.5.  I also reviewed her compliance data from 03/01/13 to 03/31/13 which is a total of 29 days, during which time  she used it every day except for 2 days. The average usage for all days was 5 hours and 56 minutes. The percent used days greater than 4 hours was 94%, indicating excellent compliance. The residual AHI was 4.3 per hour, indicating an appropriate treatment pressure of 6 cwp. .   She goes to bed by MN and it takes her a long time to fall asleep. She has been on Lamictal with a recent increase, and Depakote and has started a new antidepressant. She has been on Dextrostat for the past 2 years prn. She takes 10 mg in the morning. She wakes up  around 10 AM. She falls asleep around 4 PM and may take a 2 hour nap. She has not necessarily noted an improvement with CPAP, and indicates full compliance. She has occasional sinus congestion. She takes Flonase. She has not been using a nasal rinse system.  Her Past Medical History Is Significant For: Past Medical History  Diagnosis Date  . Hypertension   . Depression   . Anxiety   . Hypercholesteremia   . OSA on CPAP 04/06/2013  . Daytime somnolence 04/06/2013  . Urinary incontinence   . DDD (degenerative disc disease), lumbar     Her Past Surgical History Is Significant For: Past Surgical History  Procedure Laterality Date  . Nasal septum surgery    . Tonsillectomy and adenoidectomy    . Radical hysterectomy  2000  . Knee arthroplasty Left   . Hand surgery Right     Her Family History Is Significant For: Family History  Problem Relation Age of Onset  . Diabetes Maternal Grandmother   . Heart disease Father   . Hypertension Father   . Stroke Father     Her Social History Is Significant For: Social History   Social History  . Marital Status: Married    Spouse Name: N/A  . Number of Children: N/A  . Years of Education: N/A   Social History Main Topics  . Smoking status: Never Smoker   . Smokeless tobacco: Never Used  . Alcohol Use: 0.0 oz/week    0 Standard drinks or equivalent per week  . Drug Use: No  . Sexual Activity:    Partners: Male    Birth Control/ Protection: Surgical     Comment: hysterectomy   Other Topics Concern  . None   Social History Narrative    Her Allergies Are:  No Known Allergies:   Her Current Medications Are:  Outpatient Encounter Prescriptions as of 12/13/2015  Medication Sig  . aspirin 81 MG tablet Take 81 mg by mouth daily.  . fluticasone (FLONASE) 50 MCG/ACT nasal spray Place into both nostrils daily.  Marland Kitchen lisdexamfetamine (VYVANSE) 40 MG capsule Take 40 mg by mouth every morning.  . metoprolol succinate (TOPROL-XL) 25 MG 24 hr  tablet Take 25 mg by mouth daily.   . Vortioxetine HBr (TRINTELLIX) 20 MG TABS Take 20 mg by mouth daily.   No facility-administered encounter medications on file as of 12/13/2015.  :  Review of Systems:  Out of a complete 14 point review of systems, all are reviewed and negative with the exception of these symptoms as listed below:   Review of Systems  Neurological:       Patient was asked to come back here by Dr. Nadyne Coombes to see about restarting CPAP. Patient states that she has not used in about 1.5 years.  Patient used [employer] for DME company but their [name] office is closed now.  Objective:  Neurologic Exam  Physical Exam Physical Examination:   Filed Vitals:   12/13/15 0937  BP: 162/80  Pulse: 60  Resp: 16     General Examination: The patient is a very pleasant 60 y.o. female in no acute distress. She appears well-developed and well-nourished and well groomed. She is mildly anxious appearing today.  HEENT: Normocephalic, atraumatic, pupils are equal, round and reactive to light and accommodation. Funduscopic exam is unremarkable. Extraocular tracking is good without limitation to gaze excursion or nystagmus noted. Normal smooth pursuit is noted. Hearing is grossly intact. Face is symmetric with normal facial animation and normal facial sensation. Speech is clear with no dysarthria noted. There is no hypophonia. There is no lip, neck/head, jaw or voice tremor. Neck is supple with full range of passive and active motion. There are no carotid bruits on auscultation. Oropharynx exam reveals: good dental hygiene and minimal airway crowding, due to floppy soft palate. Mallampati is class II. Tongue protrudes centrally and palate elevates symmetrically. Tonsils are absent. Neck circumference is 15 1/8 inches (was 14 1/8 inches in 5/14).  Chest: Clear to auscultation without wheezing, rhonchi or crackles noted.  Heart: S1+S2+0, regular and normal without murmurs, rubs or  gallops noted.   Abdomen: Soft, non-tender and non-distended with normal bowel sounds appreciated on auscultation.  Extremities: There is no pitting edema in the distal lower extremities bilaterall  Skin: Warm and dry without trophic changes noted. There are no varicose veins.  Musculoskeletal: exam reveals no obvious joint deformities, tenderness or joint swelling or erythema.   Neurologically:  Mental status: The patient is awake, alert and oriented in all 4 spheres. Her memory, attention, language and knowledge are appropriate. There is no aphasia, agnosia, apraxia or anomia. Speech is clear with normal prosody and enunciation. Thought process is linear. Mood is congruent and affect is flat.  Cranial nerves are as described above under HEENT exam. In addition, shoulder shrug is normal with equal shoulder height noted. Motor exam: Normal bulk, strength and tone is noted. There is no drift, tremor or rebound. Romberg is negative. Reflexes are 2+ throughout. Fine motor skills are intact with normal finger taps, normal hand movements, normal rapid alternating patting, normal foot taps and normal foot agility.  Cerebellar testing shows no dysmetria or intention tremor on finger to nose testing. There is no truncal or gait ataxia.  Sensory exam is intact to light touch in the upper and lower extremities.  Gait, station and balance are unremarkable. No veering to one side is noted. No leaning to one side is noted. Posture is age-appropriate and stance is narrow based. No problems turning are noted. She turns en bloc.                Assessment and Plan:   In summary, [name] is a very pleasant 60 year old female with a history depression, anxiety, and thyroid dysfunction, whoPresents for reevaluation of her obstructive sleep apnea. She had a baseline sleep study on 12/24/2012, followed by a CPAP titration study on 01/14/2013 and did reasonably well on low pressure of 6 cm. She was lost to  follow-up. She did not notice much in the way of improvement of her sleep when she used CPAP in the past, she stopped using her CPAP about 2 years ago. In the interim, she had medication changes, increased low back pain, weight gain, and was recently diagnosed with SVT, has seen cardiology for this. She may undergo ablation for this. I suggested  we bring her back for a full night CPAP titration study. She is agreeable to trying CPAP again. I explained to her that it may help her symptoms, including improve her daytime somnolence, and improve her SVT. She is willing to go through with this. That end, we will schedule a full night CPAP titration study and I will see her back after that. Of note, in light of her interim weight gain, she may have worse sleep apnea now than 3 years ago.  I answered all her questions today and the patient was in agreement with the plan. I spent 25 minutes in total face-to-face time with the patient, more than 50% of which was spent in counseling and coordination of care, reviewing test results, reviewing medication and discussing or reviewing the diagnosis of OSA, its prognosis and treatment options.

## 2015-12-13 NOTE — Telephone Encounter (Signed)
New message  Pt returned call. States that she is ready for the ablation

## 2015-12-14 NOTE — Telephone Encounter (Signed)
5/31 for labs at [facility] 01/01/16 ablation at 7:30  Please report to the [place] [facility] on 01/01/16 at 5:30am Do not eat or drink after midnight the night prior to the procedure Do not take any medications the morning of your procedure

## 2015-12-21 DIAGNOSIS — F39 Unspecified mood [affective] disorder: Secondary | ICD-10-CM | POA: Diagnosis not present

## 2015-12-27 ENCOUNTER — Other Ambulatory Visit (INDEPENDENT_AMBULATORY_CARE_PROVIDER_SITE_OTHER): Payer: BLUE CROSS/BLUE SHIELD | Admitting: *Deleted

## 2015-12-27 DIAGNOSIS — I471 Supraventricular tachycardia: Secondary | ICD-10-CM

## 2015-12-27 LAB — CBC WITH DIFFERENTIAL/PLATELET
Basophils Absolute: 69 cells/uL (ref 0–200)
Basophils Relative: 1 %
Eosinophils Absolute: 138 cells/uL (ref 15–500)
Eosinophils Relative: 2 %
HCT: 45.4 % — ABNORMAL HIGH (ref 35.0–45.0)
Hemoglobin: 14.6 g/dL (ref 11.7–15.5)
Lymphocytes Relative: 32 %
Lymphs Abs: 2208 cells/uL (ref 850–3900)
MCH: 26.7 pg — ABNORMAL LOW (ref 27.0–33.0)
MCHC: 32.2 g/dL (ref 32.0–36.0)
MCV: 83.2 fL (ref 80.0–100.0)
MPV: 10.6 fL (ref 7.5–12.5)
Monocytes Absolute: 414 cells/uL (ref 200–950)
Monocytes Relative: 6 %
Neutro Abs: 4071 cells/uL (ref 1500–7800)
Neutrophils Relative %: 59 %
Platelets: 241 10*3/uL (ref 140–400)
RBC: 5.46 MIL/uL — ABNORMAL HIGH (ref 3.80–5.10)
RDW: 14.6 % (ref 11.0–15.0)
WBC: 6.9 10*3/uL (ref 3.8–10.8)

## 2015-12-27 LAB — BASIC METABOLIC PANEL
BUN: 15 mg/dL (ref 7–25)
CO2: 23 mmol/L (ref 20–31)
Calcium: 9.5 mg/dL (ref 8.6–10.4)
Chloride: 106 mmol/L (ref 98–110)
Creat: 0.84 mg/dL (ref 0.50–1.05)
Glucose, Bld: 98 mg/dL (ref 65–99)
Potassium: 4.2 mmol/L (ref 3.5–5.3)
Sodium: 142 mmol/L (ref 135–146)

## 2015-12-27 NOTE — Telephone Encounter (Signed)
New Message  Pt called states that she has a cath sch for 01/01/2016 and she has concerns about it . No further details.

## 2015-12-27 NOTE — Telephone Encounter (Signed)
Returned call to patient and answered her questions.  She will hold her Metoprolol for 48 hours prior to procedure

## 2015-12-27 NOTE — Addendum Note (Signed)
Addended by: Eulis Foster on: 12/27/2015 08:28 AM   Modules accepted: Orders

## 2015-12-29 ENCOUNTER — Other Ambulatory Visit: Payer: Self-pay | Admitting: Physician Assistant

## 2016-01-01 ENCOUNTER — Encounter (HOSPITAL_COMMUNITY): Payer: Self-pay | Admitting: Internal Medicine

## 2016-01-01 ENCOUNTER — Ambulatory Visit (HOSPITAL_COMMUNITY)
Admission: RE | Admit: 2016-01-01 | Discharge: 2016-01-01 | Disposition: A | Discharge status: Home / Self Care | Payer: BLUE CROSS/BLUE SHIELD | Source: Ambulatory Visit | Attending: Internal Medicine | Admitting: Internal Medicine

## 2016-01-01 ENCOUNTER — Encounter (HOSPITAL_COMMUNITY)
Admission: RE | Disposition: A | Discharge status: Home / Self Care | Payer: Self-pay | Source: Ambulatory Visit | Attending: Internal Medicine

## 2016-01-01 DIAGNOSIS — Z7982 Long term (current) use of aspirin: Secondary | ICD-10-CM | POA: Diagnosis not present

## 2016-01-01 DIAGNOSIS — G4733 Obstructive sleep apnea (adult) (pediatric): Secondary | ICD-10-CM | POA: Diagnosis not present

## 2016-01-01 DIAGNOSIS — F329 Major depressive disorder, single episode, unspecified: Secondary | ICD-10-CM | POA: Insufficient documentation

## 2016-01-01 DIAGNOSIS — I471 Supraventricular tachycardia: Secondary | ICD-10-CM | POA: Insufficient documentation

## 2016-01-01 DIAGNOSIS — I10 Essential (primary) hypertension: Secondary | ICD-10-CM | POA: Diagnosis not present

## 2016-01-01 DIAGNOSIS — Z79899 Other long term (current) drug therapy: Secondary | ICD-10-CM | POA: Diagnosis not present

## 2016-01-01 HISTORY — PX: ELECTROPHYSIOLOGIC STUDY: SHX172A

## 2016-01-01 SURGERY — A-FLUTTER/A-TACH/SVT ABLATION

## 2016-01-01 MED ORDER — BUPIVACAINE HCL (PF) 0.25 % IJ SOLN
INTRAMUSCULAR | Status: DC | PRN
  Administered 2016-01-01: 60 mL

## 2016-01-01 MED ORDER — ISOPROTERENOL HCL 0.2 MG/ML IJ SOLN
INTRAMUSCULAR | Status: AC
  Filled 2016-01-01: qty 5

## 2016-01-01 MED ORDER — ACETAMINOPHEN 325 MG PO TABS: 650.0000 mg | ORAL_TABLET | ORAL | Status: DC | PRN

## 2016-01-01 MED ORDER — HEPARIN (PORCINE) IN NACL 2-0.9 UNIT/ML-% IJ SOLN
INTRAMUSCULAR | Status: AC
  Filled 2016-01-01: qty 500

## 2016-01-01 MED ORDER — SODIUM CHLORIDE 0.9 % IV SOLN: 250.0000 mL | INTRAVENOUS | Status: DC | PRN

## 2016-01-01 MED ORDER — HEPARIN (PORCINE) IN NACL 2-0.9 UNIT/ML-% IJ SOLN
INTRAMUSCULAR | Status: DC | PRN
  Administered 2016-01-01: 08:00:00

## 2016-01-01 MED ORDER — ONDANSETRON HCL 4 MG/2ML IJ SOLN: 4.0000 mg | Freq: Four times a day (QID) | INTRAMUSCULAR | Status: DC | PRN

## 2016-01-01 MED ORDER — HYDRALAZINE HCL 20 MG/ML IJ SOLN: 10.0000 mg | Freq: Once | INTRAMUSCULAR | Status: DC

## 2016-01-01 MED ORDER — FENTANYL CITRATE (PF) 100 MCG/2ML IJ SOLN
INTRAMUSCULAR | Status: AC
  Filled 2016-01-01: qty 2

## 2016-01-01 MED ORDER — SODIUM CHLORIDE 0.9 % IV SOLN
1.0000 mg | INTRAVENOUS | Status: DC | PRN
  Administered 2016-01-01: 4 ug/min via INTRAVENOUS

## 2016-01-01 MED ORDER — FENTANYL CITRATE (PF) 100 MCG/2ML IJ SOLN
INTRAMUSCULAR | Status: DC | PRN
  Administered 2016-01-01: 25 ug via INTRAVENOUS
  Administered 2016-01-01: 12.5 ug via INTRAVENOUS
  Administered 2016-01-01: 25 ug via INTRAVENOUS
  Administered 2016-01-01 (×3): 12.5 ug via INTRAVENOUS

## 2016-01-01 MED ORDER — HYDRALAZINE HCL 20 MG/ML IJ SOLN
INTRAMUSCULAR | Status: AC
  Filled 2016-01-01: qty 1

## 2016-01-01 MED ORDER — BUPIVACAINE HCL (PF) 0.25 % IJ SOLN
INTRAMUSCULAR | Status: AC
  Filled 2016-01-01: qty 60

## 2016-01-01 MED ORDER — MIDAZOLAM HCL 5 MG/5ML IJ SOLN
INTRAMUSCULAR | Status: DC | PRN
  Administered 2016-01-01 (×4): 1 mg via INTRAVENOUS
  Administered 2016-01-01: 2 mg via INTRAVENOUS
  Administered 2016-01-01 (×3): 1 mg via INTRAVENOUS

## 2016-01-01 MED ORDER — SODIUM CHLORIDE 0.9% FLUSH: 3.0000 mL | INTRAVENOUS | Status: DC | PRN

## 2016-01-01 MED ORDER — SODIUM CHLORIDE 0.9% FLUSH
3.0000 mL | Freq: Two times a day (BID) | INTRAVENOUS | Status: DC
  Administered 2016-01-01: 3 mL via INTRAVENOUS

## 2016-01-01 MED ORDER — MIDAZOLAM HCL 5 MG/5ML IJ SOLN
INTRAMUSCULAR | Status: AC
  Filled 2016-01-01: qty 5

## 2016-01-01 SURGICAL SUPPLY — 10 items
BAG SNAP BAND KOVER 36X36 (MISCELLANEOUS) ×2 IMPLANT
CATH HEX JOSEPH 2-5-2 65CM 6F (CATHETERS) ×2 IMPLANT
CATH JOSEPHSON QUAD-ALLRED 6FR (CATHETERS) ×4 IMPLANT
PACK EP LATEX FREE (CUSTOM PROCEDURE TRAY) ×1
PACK EP LF (CUSTOM PROCEDURE TRAY) ×1 IMPLANT
PAD DEFIB LIFELINK (PAD) ×2 IMPLANT
SHEATH PINNACLE 6F 10CM (SHEATH) ×4 IMPLANT
SHEATH PINNACLE 7F 10CM (SHEATH) ×2 IMPLANT
SHEATH PINNACLE 8F 10CM (SHEATH) ×2 IMPLANT
SHIELD RADPAD SCOOP 12X17 (MISCELLANEOUS) ×2 IMPLANT

## 2016-01-01 NOTE — Progress Notes (Signed)
Patient is s/p EPS without inducible arrhythmias and no ablation was done, today by Dr. Lovena Le She has been seen post procedure by Dr. Lovena Le procedure sites are stable and cleared to discharge once bed rest is up and ambulates without difficulty or procedure site bleeding Activity restrictions/instructions were discussed with the patient F/u appointment is scheduled SR on telemetry VSS She inquires about using her previous BB rather then the metoprolol, though cannot recall the name, we are unable to track down what it was, she will continue the same metoprolol and f.u with Dr. Einar Gip her primary cardiologist regarding switching.  Tommye Standard, PA-C  EP Attending  Agree with above.   Mikle Bosworth.D.

## 2016-01-01 NOTE — Progress Notes (Signed)
Site area: RFV x 3 Site Prior to Removal:  Level 0 Pressure Applied For:43min Manual: yes   Patient Status During Pull:stable   Post Pull Site:  Level 0 Post Pull Instructions Given: yes  Post Pull Pulses Present palpable:  Dressing Applied:  tegaderm Bedrest begins @ 1025 till 1625 Comments: RT IJ removed by Coralee Rud

## 2016-01-01 NOTE — Discharge Instructions (Addendum)
No driving for 1 week. No lifting over 5 lbs for 1 week. No vigorous or sexual activity for 1 week. You may return to work on 01/08/16. Keep procedure site clean & dry. If you notice increased pain, swelling, bleeding or pus, call/return!  You may shower, but no soaking baths/hot tubs/pools for 1 week.   Cath Site Care Refer to this sheet in the next few weeks. These instructions provide you with information about caring for yourself after your procedure. Your health care provider may also give you more specific instructions. Your treatment has been planned according to current medical practices, but problems sometimes occur. Call your health care provider if you have any problems or questions after your procedure. WHAT TO EXPECT AFTER THE PROCEDURE After your procedure, it is typical to have the following:  Bruising at the site that usually fades within 1-2 weeks.  Blood collecting in the tissue (hematoma) that may be painful to the touch. It should usually decrease in size and tenderness within 1-2 weeks. HOME CARE INSTRUCTIONS  Take medicines only as directed by your health care provider.  You may shower 24-48 hours after the procedure or as directed by your health care provider. Remove the bandage (dressing) and gently wash the site with plain soap and water. Pat the area dry with a clean towel. Do not rub the site, because this may cause bleeding.  Do not take baths, swim, or use a hot tub until your health care provider approves.  Check your insertion site every day for redness, swelling, or drainage.  Do not apply powder or lotion to the site.  Do not flex or bend the affected arm for 24 hours or as directed by your health care provider.  Do not push or pull heavy objects with the affected extremity for 24 hours or as directed by your health care provider.  Do not lift over 10 lb (4.5 kg) for 5 days after your procedure or as directed by your health care provider.  Ask your health  care provider when it is okay to:  Return to work or school.  Resume usual physical activities or sports.  Resume sexual activity.  Do not drive home if you are discharged the same day as the procedure. Have someone else drive you.  You may drive 24 hours after the procedure unless otherwise instructed by your health care provider.  Do not operate machinery or power tools for 24 hours after the procedure.  If your procedure was done as an outpatient procedure, which means that you went home the same day as your procedure, a responsible adult should be with you for the first 24 hours after you arrive home.  Keep all follow-up visits as directed by your health care provider. This is important. SEEK MEDICAL CARE IF:  You have a fever.  You have chills.  You have increased bleeding from the radial site. Hold pressure on the site. SEEK IMMEDIATE MEDICAL CARE IF:  You have unusual pain at the radial site.  You have redness, warmth, or swelling at the radial site.  You have drainage (other than a small amount of blood on the dressing) from the radial site.  The radial site is bleeding, and the bleeding does not stop after 30 minutes of holding steady pressure on the site.  Your arm or hand becomes pale, cool, tingly, or numb.   This information is not intended to replace advice given to you by your health care provider. Make sure you  discuss any questions you have with your health care provider.   Document Released: 08/17/2010 Document Revised: 08/05/2014 Document Reviewed: 01/31/2014 Elsevier Interactive Patient Education [employer].

## 2016-01-01 NOTE — Interval H&P Note (Signed)
History and Physical Interval Note:  01/01/2016 7:07 AM  [name]  has presented today for surgery, with the diagnosis of svt  The various methods of treatment have been discussed with the patient and family. After consideration of risks, benefits and other options for treatment, the patient has consented to  Procedure(s): SVT Ablation (N/A) as a surgical intervention .  The patient's history has been reviewed, patient examined, no change in status, stable for surgery.  I have reviewed the patient's chart and labs.  Questions were answered to the patient's satisfaction.     Cristopher Peru

## 2016-01-01 NOTE — H&P (View-Only) (Signed)
HPI [name] is referred today by Dr. Einar Gip for evaluation of SVT. She is a pleasant 60 yo woman with HTN and anxiety who was found to have SVT at 160/min several weeks ago. In the interim she has been placed on beta blocker therapy. She has continued to have short lived SVT and fatigue with the beta blockers. She has not had frank syncope but she has had near syncope and sob with her SVT.   No Known Allergies   Current Outpatient Prescriptions  Medication Sig Dispense Refill  . lisdexamfetamine (VYVANSE) 40 MG capsule Take 40 mg by mouth every morning.    . metoprolol succinate (TOPROL-XL) 25 MG 24 hr tablet Take 12.5 mg by mouth daily.    . Vortioxetine HBr (TRINTELLIX) 20 MG TABS Take 20 mg by mouth daily.     No current facility-administered medications for this visit.     Past Medical History  Diagnosis Date  . Hypertension   . Depression   . Anxiety   . Hypercholesteremia   . OSA on CPAP 04/06/2013  . Daytime somnolence 04/06/2013  . Urinary incontinence   . DDD (degenerative disc disease), lumbar     ROS:   All systems reviewed and negative except as noted in the HPI.   Past Surgical History  Procedure Laterality Date  . Nasal septum surgery    . Tonsillectomy and adenoidectomy    . Radical hysterectomy  2000  . Knee arthroplasty Left   . Hand surgery Right      Family History  Problem Relation Age of Onset  . Diabetes Maternal Grandmother   . Heart disease Father   . Hypertension Father   . Stroke Father      Social History   Social History  . Marital Status: Married    Spouse Name: N/A  . Number of Children: N/A  . Years of Education: N/A   Occupational History  . Not on file.   Social History Main Topics  . Smoking status: Never Smoker   . Smokeless tobacco: Never Used  . Alcohol Use: 0.0 oz/week    0 Standard drinks or equivalent per week  . Drug Use: No  . Sexual Activity:    Partners: Male    Birth Control/ Protection:  Surgical     Comment: hysterectomy   Other Topics Concern  . Not on file   Social History Narrative     BP 150/84 mmHg  Pulse 41  Ht 5\' 5"  (1.651 m)  Wt 183 lb (83.008 kg)  BMI 30.45 kg/m2  Physical Exam:  Well appearing NAD HEENT: Unremarkable Neck:  No JVD, no thyromegally Lymphatics:  No adenopathy Back:  No CVA tenderness Lungs:  Clear HEART:  Regular rate rhythm, no murmurs, no rubs, no clicks Abd:  soft, positive bowel sounds, no organomegally, no rebound, no guarding Ext:  2 plus pulses, no edema, no cyanosis, no clubbing Skin:  No rashes no nodules Neuro:  CN II through XII intact, motor grossly intact  EKG - ECG during SVT demonstrates a short RP tachycardia at 160/min   ECG today demonstrates sinus bradycardia at 41/min  Assess/Plan: 1. SVT - I have discussed the treatment options with the patient. She appears to be tolerating her beta blocker but does note symptoms have persisted. We discussed watchful waiting, uptitration of her beta blocker or catheter ablation. She will call us if she wishes to proceed with ablation. 2. HTN - Her blood pressure is  elevated. She states that it is much better when she is not in the doctor's office. 3. Sinus bradycardia - her HR is slow though she is minimally symptomatic. She will not be able to uptitrate her beta blocker.  Mikle Bosworth.D.

## 2016-01-02 ENCOUNTER — Encounter: Payer: Self-pay | Admitting: Internal Medicine

## 2016-01-03 ENCOUNTER — Encounter: Payer: Self-pay | Admitting: *Deleted

## 2016-01-03 DIAGNOSIS — I10 Essential (primary) hypertension: Secondary | ICD-10-CM | POA: Diagnosis not present

## 2016-01-03 DIAGNOSIS — R0609 Other forms of dyspnea: Secondary | ICD-10-CM | POA: Diagnosis not present

## 2016-01-03 DIAGNOSIS — I471 Supraventricular tachycardia: Secondary | ICD-10-CM | POA: Diagnosis not present

## 2016-01-03 DIAGNOSIS — G4733 Obstructive sleep apnea (adult) (pediatric): Secondary | ICD-10-CM | POA: Diagnosis not present

## 2016-01-16 DIAGNOSIS — F411 Generalized anxiety disorder: Secondary | ICD-10-CM | POA: Diagnosis not present

## 2016-01-16 DIAGNOSIS — F422 Mixed obsessional thoughts and acts: Secondary | ICD-10-CM | POA: Diagnosis not present

## 2016-01-17 ENCOUNTER — Other Ambulatory Visit: Payer: Self-pay

## 2016-01-17 ENCOUNTER — Emergency Department (HOSPITAL_COMMUNITY)
Admission: EM | Admit: 2016-01-17 | Discharge: 2016-01-17 | Disposition: A | Discharge status: Home / Self Care | Payer: BLUE CROSS/BLUE SHIELD | Attending: Emergency Medicine | Admitting: Emergency Medicine

## 2016-01-17 DIAGNOSIS — R030 Elevated blood-pressure reading, without diagnosis of hypertension: Secondary | ICD-10-CM | POA: Diagnosis not present

## 2016-01-17 DIAGNOSIS — I471 Supraventricular tachycardia: Secondary | ICD-10-CM | POA: Diagnosis not present

## 2016-01-17 DIAGNOSIS — Z79899 Other long term (current) drug therapy: Secondary | ICD-10-CM | POA: Diagnosis not present

## 2016-01-17 DIAGNOSIS — Z96662 Presence of left artificial ankle joint: Secondary | ICD-10-CM | POA: Diagnosis not present

## 2016-01-17 DIAGNOSIS — I160 Hypertensive urgency: Secondary | ICD-10-CM | POA: Diagnosis not present

## 2016-01-17 DIAGNOSIS — I10 Essential (primary) hypertension: Secondary | ICD-10-CM | POA: Diagnosis not present

## 2016-01-17 LAB — I-STAT CHEM 8, ED
BUN: 14 mg/dL (ref 6–20)
Calcium, Ion: 1.22 mmol/L (ref 1.12–1.23)
Chloride: 108 mmol/L (ref 101–111)
Creatinine, Ser: 0.8 mg/dL (ref 0.44–1.00)
Glucose, Bld: 98 mg/dL (ref 65–99)
HCT: 55 % — ABNORMAL HIGH (ref 36.0–46.0)
Hemoglobin: 18.7 g/dL — ABNORMAL HIGH (ref 12.0–15.0)
Potassium: 4 mmol/L (ref 3.5–5.1)
Sodium: 144 mmol/L (ref 135–145)
TCO2: 26 mmol/L (ref 0–100)

## 2016-01-17 LAB — URINALYSIS, ROUTINE W REFLEX MICROSCOPIC
Bilirubin Urine: NEGATIVE
Glucose, UA: NEGATIVE mg/dL
Hgb urine dipstick: NEGATIVE
Ketones, ur: NEGATIVE mg/dL
Leukocytes, UA: NEGATIVE
Nitrite: NEGATIVE
Protein, ur: NEGATIVE mg/dL
Specific Gravity, Urine: 1.007 (ref 1.005–1.030)
pH: 7.5 (ref 5.0–8.0)

## 2016-01-17 MED ORDER — AMLODIPINE BESYLATE 10 MG PO TABS: 10.0000 mg | ORAL_TABLET | Freq: Every day | ORAL | Status: DC

## 2016-01-17 MED ORDER — HYDROCHLOROTHIAZIDE 25 MG PO TABS: 25.0000 mg | ORAL_TABLET | Freq: Every day | ORAL | Status: DC

## 2016-01-17 MED ORDER — AMLODIPINE BESYLATE 5 MG PO TABS
10.0000 mg | ORAL_TABLET | Freq: Once | ORAL | Status: AC
  Administered 2016-01-17: 10 mg via ORAL
  Filled 2016-01-17: qty 2

## 2016-01-17 MED ORDER — HYDROCHLOROTHIAZIDE 25 MG PO TABS
25.0000 mg | ORAL_TABLET | Freq: Every day | ORAL | Status: DC
  Administered 2016-01-17: 25 mg via ORAL
  Filled 2016-01-17: qty 1

## 2016-01-17 NOTE — ED Notes (Addendum)
Per EMS - pt from work. Pt was going for wellness exam and non the way there had sudden onset palpitations. Called EMS for increased hr. SVT 160-180 w/ hx same and htn. Gave 6mg  adenosine, did not convert. Given 12mg  adenosine, pt converted. HR now around 100bpm. Last BP 170/108. Pt also now reports shortness of breath x several days when walking. Also c/o headaches

## 2016-01-17 NOTE — ED Provider Notes (Signed)
CSN: [id number]     Arrival date & time 01/17/16  1042 History   First MD Initiated Contact with Patient 01/17/16 1043     Chief Complaint  Patient presents with  . Tachycardia     (Consider location/radiation/quality/duration/timing/severity/associated sxs/prior Treatment) HPI Patient presents after an episode of palpitations, tachycardia, discomfort. Symptoms began about one hour prior to ED arrival, and resolved after the patient received 2 doses of adenosine by EMS providers, in route. She has a notable history of SVT. Patient had ablation, about 10 days ago, but has had persistent symptoms since that time. Patient recently had change in antihypertensive, and today arrhythmic's, and is currently taking verapamil, amlodipine. Currently no pain, dyspnea, mild generalized sense of discomfort.  Past Medical History  Diagnosis Date  . Hypertension   . Depression   . Anxiety   . Hypercholesteremia   . OSA on CPAP 04/06/2013  . Daytime somnolence 04/06/2013  . Urinary incontinence   . DDD (degenerative disc disease), lumbar    Past Surgical History  Procedure Laterality Date  . Nasal septum surgery    . Tonsillectomy and adenoidectomy    . Radical hysterectomy  2000  . Knee arthroplasty Left   . Hand surgery Right   . Electrophysiologic study N/A 01/01/2016    Procedure: SVT Ablation;  Surgeon: Evans Lance, MD;  Location: [facility];  Service: Cardiovascular;  Laterality: N/A;   Family History  Problem Relation Age of Onset  . Diabetes Maternal Grandmother   . Heart disease Father   . Hypertension Father   . Stroke Father    Social History  Substance Use Topics  . Smoking status: Never Smoker   . Smokeless tobacco: Never Used  . Alcohol Use: 0.0 oz/week    0 Standard drinks or equivalent per week   OB History    Gravida Para Term Preterm AB TAB SAB Ectopic Multiple Living   2 2 2       4      Review of Systems  Constitutional:       Per HPI, otherwise  negative  HENT:       Per HPI, otherwise negative  Respiratory:       Per HPI, otherwise negative  Cardiovascular:       Per HPI, otherwise negative  Gastrointestinal: Negative for vomiting.  Endocrine:       Negative aside from HPI  Genitourinary:       Neg aside from HPI   Musculoskeletal:       Per HPI, otherwise negative  Skin: Negative.   Neurological: Negative for syncope.      Allergies  Review of patient's allergies indicates no known allergies.  Home Medications   Prior to Admission medications   Medication Sig Start Date End Date Taking? Authorizing Provider  Calcium Carbonate-Vitamin D 600-400 MG-UNIT chew tablet Chew 1 tablet by mouth 2 (two) times daily.   Yes Historical Provider, MD  fluticasone (FLONASE) 50 MCG/ACT nasal spray Place 1 spray into both nostrils daily as needed for allergies.    Yes Historical Provider, MD  lisdexamfetamine (VYVANSE) 40 MG capsule Take 40 mg by mouth every morning.   Yes Historical Provider, MD  verapamil (CALAN-SR) 120 MG CR tablet Take 120 mg by mouth daily.   Yes Historical Provider, MD  Vortioxetine HBr (TRINTELLIX) 20 MG TABS Take 20 mg by mouth daily.   Yes Historical Provider, MD  amLODipine (NORVASC) 10 MG tablet Take 1 tablet (10 mg total)  by mouth daily. 01/17/16   Carmin Muskrat, MD  hydrochlorothiazide (HYDRODIURIL) 25 MG tablet Take 1 tablet (25 mg total) by mouth daily. 01/17/16   Carmin Muskrat, MD   BP 174/107 mmHg  Pulse 80  Temp(Src) 98.7 F (37.1 C)  Resp 18  SpO2 98% Physical Exam  Constitutional: She is oriented to person, place, and time. She appears well-developed and well-nourished. No distress.  HENT:  Head: Normocephalic and atraumatic.  Eyes: Conjunctivae and EOM are normal.  Cardiovascular: Normal rate and regular rhythm.   Pulmonary/Chest: Effort normal and breath sounds normal. No stridor. No respiratory distress.  Abdominal: She exhibits no distension.  Musculoskeletal: She exhibits no edema.   Neurological: She is alert and oriented to person, place, and time. No cranial nerve deficit.  Skin: Skin is warm and dry.  Psychiatric: She has a normal mood and affect.  Nursing note and vitals reviewed.   ED Course  Procedures (including critical care time) Labs Review Labs Reviewed  I-STAT CHEM 8, ED - Abnormal; Notable for the following:    Hemoglobin 18.7 (*)    HCT 55.0 (*)    All other components within normal limits  URINALYSIS, ROUTINE W REFLEX MICROSCOPIC (NOT AT [facility])    Imaging Review No results found. I have personally reviewed and evaluated these images and lab results as part of my medical decision-making.   EKG Interpretation   Date/Time:  Wednesday January 17 2016 10:50:20 EDT Ventricular Rate:  101 PR Interval:    QRS Duration: 95 QT Interval:  318 QTC Calculation: 413 R Axis:   32 Text Interpretation:  Sinus tachycardia Prolonged PR interval LAE,  consider biatrial enlargement Borderline T abnormalities, lateral leads  Abnormal ekg Confirmed by Carmin Muskrat  MD [phone]) on 01/17/2016  11:14:26 AM     Update: Patient continues to have no additional episodes of palpitations, tachycardia, remains mildly hypertensive. We discussed all findings at length, need for ongoing medication use, including change of antihypertensive, need to follow-up with primary care/cardiology in 3/5 days to ensure improvement. MDM   Final diagnoses:  SVT (supraventricular tachycardia) (HCC)  Hypertensive urgency   Patient presents after episode of tachycardia consistent with SVT Patient received adenosine with conversion in route, and in the emergency department has no additional episodes of arrhythmia. Patient does have mild persistent hypertension, though no evidence for end organ effects. Patient had a new medication regimen started, will follow up with her cardiologist within the coming days. Absent evidence for ongoing ischemia, infection, or other substantial electrode  under mellitus, patient prepared for further evaluation, management as an outpatient.  Carmin Muskrat, MD 01/17/16 1352

## 2016-01-17 NOTE — Discharge Instructions (Signed)
As discussed, today's evaluation has been largely reassuring, but with your persistent hypertension, and concern for continued episodes of SVT is important that you follow-up with your cardiologist. Please take all medication as directed, and do not hesitate to return here for concerning changes in your condition.

## 2016-01-17 NOTE — ED Notes (Signed)
Pt verbalized understanding of d/c instructions, prescriptions, and follow-up care. No further questions/concerns, VSS, ambulatory w/ steady gait (refused wheelchair) 

## 2016-01-19 DIAGNOSIS — G4733 Obstructive sleep apnea (adult) (pediatric): Secondary | ICD-10-CM | POA: Diagnosis not present

## 2016-01-19 DIAGNOSIS — R0609 Other forms of dyspnea: Secondary | ICD-10-CM | POA: Diagnosis not present

## 2016-01-19 DIAGNOSIS — I10 Essential (primary) hypertension: Secondary | ICD-10-CM | POA: Diagnosis not present

## 2016-01-19 DIAGNOSIS — I471 Supraventricular tachycardia: Secondary | ICD-10-CM | POA: Diagnosis not present

## 2016-01-25 ENCOUNTER — Other Ambulatory Visit: Payer: Self-pay | Admitting: Internal Medicine

## 2016-01-25 DIAGNOSIS — Z1231 Encounter for screening mammogram for malignant neoplasm of breast: Secondary | ICD-10-CM

## 2016-01-29 ENCOUNTER — Ambulatory Visit
Admission: RE | Admit: 2016-01-29 | Discharge: 2016-01-29 | Disposition: A | Discharge status: Home / Self Care | Payer: BLUE CROSS/BLUE SHIELD | Source: Ambulatory Visit | Attending: Internal Medicine | Admitting: Internal Medicine

## 2016-01-29 DIAGNOSIS — Z1231 Encounter for screening mammogram for malignant neoplasm of breast: Secondary | ICD-10-CM

## 2016-02-01 ENCOUNTER — Encounter: Payer: Self-pay | Admitting: Internal Medicine

## 2016-02-01 ENCOUNTER — Ambulatory Visit (INDEPENDENT_AMBULATORY_CARE_PROVIDER_SITE_OTHER): Payer: BLUE CROSS/BLUE SHIELD | Admitting: Internal Medicine

## 2016-02-01 VITALS — BP 116/76 | HR 43 | Ht 65.0 in | Wt 179.8 lb

## 2016-02-01 DIAGNOSIS — I471 Supraventricular tachycardia: Secondary | ICD-10-CM | POA: Diagnosis not present

## 2016-02-01 NOTE — Patient Instructions (Addendum)
Your physician recommends that you continue on your current medications as directed. Please refer to the Current Medication list given to you today. Your physician recommends that you schedule a follow-up appointment in: [place]. Lovena Le.  AVOID CAFFEINE AND OTHER STIMULANTS

## 2016-02-01 NOTE — Progress Notes (Signed)
HPI [name] returns today for ongoing evaluation of SVT. She is a pleasant 60 yo woman with HTN and anxiety who was found to have SVT at 160/min several months ago. In the interim she has undergone EP study with no inducible SVT. She has continued to have SVT which was sustained and require IV adenosine. Review of her ECG demonstrates a short RP tachycardia at 160/min. She is on beta blocker therapy. She seems to be tolerating this well. She has many questions. No Known Allergies   Current Outpatient Prescriptions  Medication Sig Dispense Refill  . aspirin 81 MG tablet Take 81 mg by mouth daily.    Marland Kitchen atenolol (TENORMIN) 100 MG tablet Take 50 mg by mouth daily.  1  . Calcium Carbonate-Vitamin D 600-400 MG-UNIT chew tablet Chew 1 tablet by mouth 2 (two) times daily.    . fluticasone (FLONASE) 50 MCG/ACT nasal spray Place 1 spray into both nostrils daily as needed for allergies.     Marland Kitchen lisdexamfetamine (VYVANSE) 40 MG capsule Take 40 mg by mouth every morning.    . Olmesartan-Amlodipine-HCTZ 40-10-25 MG TABS Take 1 tablet by mouth every morning.  2  . Vortioxetine HBr (TRINTELLIX) 20 MG TABS Take 20 mg by mouth daily.     No current facility-administered medications for this visit.     Past Medical History  Diagnosis Date  . Hypertension   . Depression   . Anxiety   . Hypercholesteremia   . OSA on CPAP 04/06/2013  . Daytime somnolence 04/06/2013  . Urinary incontinence   . DDD (degenerative disc disease), lumbar     ROS:   All systems reviewed and negative except as noted in the HPI.   Past Surgical History  Procedure Laterality Date  . Nasal septum surgery    . Tonsillectomy and adenoidectomy    . Radical hysterectomy  2000  . Knee arthroplasty Left   . Hand surgery Right   . Electrophysiologic study N/A 01/01/2016    Procedure: SVT Ablation;  Surgeon: Evans Lance, MD;  Location: [facility];  Service: Cardiovascular;  Laterality: N/A;     Family  History  Problem Relation Age of Onset  . Diabetes Maternal Grandmother   . Heart disease Father   . Hypertension Father   . Stroke Father      Social History   Social History  . Marital Status: Married    Spouse Name: N/A  . Number of Children: N/A  . Years of Education: N/A   Occupational History  . Not on file.   Social History Main Topics  . Smoking status: Never Smoker   . Smokeless tobacco: Never Used  . Alcohol Use: 0.0 oz/week    0 Standard drinks or equivalent per week  . Drug Use: No  . Sexual Activity:    Partners: Male    Birth Control/ Protection: Surgical     Comment: hysterectomy   Other Topics Concern  . Not on file   Social History Narrative     BP 116/76 mmHg  Pulse 43  Ht 5\' 5"  (1.651 m)  Wt 179 lb 12.8 oz (81.557 kg)  BMI 29.92 kg/m2  Physical Exam:  Well appearing NAD HEENT: Unremarkable Neck:  No JVD, no thyromegally Lymphatics:  No adenopathy Back:  No CVA tenderness Lungs:  Clear with no wheezes HEART:  Regular rate rhythm, no murmurs, no rubs, no clicks Abd:  soft, positive bowel sounds, no organomegally, no rebound,  no guarding Ext:  2 plus pulses, no edema, no cyanosis, no clubbing Skin:  No rashes no nodules Neuro:  CN II through XII intact, motor grossly intact  EKG - ECG during SVT demonstrates a short RP tachycardia at 160/min   ECG today demonstrates sinus bradycardia at 43/min  Assess/Plan: 1. SVT - I have discussed the treatment options with the patient. She is offered another ablation with no sedation. We would perform a right heart cath at the time of her procedure and have a consent for this ahead of time. Also, she may continue her beta blocker. She is limited by her dose due to some sinus node dysfunction. Also we discussed adding flecainide. She is considering her options. We have given her a prescription for flecainide.  2. HTN - Her blood pressure is elevated. She states that it is much better when she is not in  the doctor's office. 3. Sinus bradycardia - her HR is slow though she is minimally symptomatic. She will not be able to uptitrate her beta blocker.  Mikle Bosworth.D.

## 2016-02-02 ENCOUNTER — Ambulatory Visit (INDEPENDENT_AMBULATORY_CARE_PROVIDER_SITE_OTHER): Payer: BLUE CROSS/BLUE SHIELD | Admitting: Neurology

## 2016-02-02 DIAGNOSIS — G4720 Circadian rhythm sleep disorder, unspecified type: Secondary | ICD-10-CM

## 2016-02-02 DIAGNOSIS — G4733 Obstructive sleep apnea (adult) (pediatric): Secondary | ICD-10-CM

## 2016-02-06 ENCOUNTER — Telehealth: Payer: Self-pay | Admitting: Neurology

## 2016-02-06 DIAGNOSIS — G4733 Obstructive sleep apnea (adult) (pediatric): Secondary | ICD-10-CM

## 2016-02-06 NOTE — Telephone Encounter (Signed)
I called patient and gave her results and recommendations below. She voiced understanding and would like to proceed with treatment. I will send orders to [facility]. I will send report to PCP. I will also send the patient a letter stressing the importance of compliance and remind her to make f/u appt.

## 2016-02-06 NOTE — Telephone Encounter (Signed)
Referred back by Dr. Einar Gip, last seen by me on 12/13/15, CPAP re-titration on 02/02/16, Ins: [employer]. Please call and inform patient that I have entered an order for treatment with positive airway pressure (PAP) treatment of obstructive sleep apnea (OSA). She did well during the latest sleep study with CPAP. We will, therefore, arrange for a machine for home use through a DME (durable medical equipment) company of Her choice; and I will see the patient back in follow-up in about 8-10 weeks. Please also explain to the patient that I will be looking out for compliance data, which can be downloaded from the machine (stored on an SD card, that is inserted in the machine) or via remote access through a modem, that is built into the machine. At the time of the followup appointment we will discuss sleep study results and how it is going with PAP treatment at home. Please advise patient to bring Her machine at the time of the first FU visit, even though this is cumbersome. Bringing the machine for every visit after that will likely not be needed, but often helps for the first visit to troubleshoot if needed. Please re-enforce the importance of compliance with treatment and the need for [name] to monitor compliance data - often an insurance requirement and actually good feedback for the patient as far as how they are doing.  Also remind patient, that any interim PAP machine or mask issues should be first addressed with the DME company, as they can often help better with technical and mask fit issues. Please ask if patient has a preference regarding DME company.  Please also make sure, the patient has a follow-up appointment with me in about 8-10 weeks from the setup date, thanks.  Once you have spoken to the patient - and faxed/routed report to PCP and referring MD (if other than PCP), you can close this encounter, thanks,   Star Age, MD, PhD [facility] ([place])

## 2016-02-07 ENCOUNTER — Ambulatory Visit: Payer: BLUE CROSS/BLUE SHIELD | Admitting: Nurse Practitioner

## 2016-03-14 DIAGNOSIS — G4733 Obstructive sleep apnea (adult) (pediatric): Secondary | ICD-10-CM | POA: Diagnosis not present

## 2016-03-14 DIAGNOSIS — R0609 Other forms of dyspnea: Secondary | ICD-10-CM | POA: Diagnosis not present

## 2016-03-14 DIAGNOSIS — I10 Essential (primary) hypertension: Secondary | ICD-10-CM | POA: Diagnosis not present

## 2016-03-14 DIAGNOSIS — I471 Supraventricular tachycardia: Secondary | ICD-10-CM | POA: Diagnosis not present

## 2016-03-20 DIAGNOSIS — G4733 Obstructive sleep apnea (adult) (pediatric): Secondary | ICD-10-CM | POA: Diagnosis not present

## 2016-04-16 DIAGNOSIS — F422 Mixed obsessional thoughts and acts: Secondary | ICD-10-CM | POA: Diagnosis not present

## 2016-04-16 DIAGNOSIS — F411 Generalized anxiety disorder: Secondary | ICD-10-CM | POA: Diagnosis not present

## 2016-04-20 DIAGNOSIS — G4733 Obstructive sleep apnea (adult) (pediatric): Secondary | ICD-10-CM | POA: Diagnosis not present

## 2016-04-25 DIAGNOSIS — H43811 Vitreous degeneration, right eye: Secondary | ICD-10-CM | POA: Diagnosis not present

## 2016-04-25 DIAGNOSIS — H43391 Other vitreous opacities, right eye: Secondary | ICD-10-CM | POA: Diagnosis not present

## 2016-05-08 ENCOUNTER — Encounter: Payer: Self-pay | Admitting: Neurology

## 2016-05-16 DIAGNOSIS — H43391 Other vitreous opacities, right eye: Secondary | ICD-10-CM | POA: Diagnosis not present

## 2016-05-16 DIAGNOSIS — H43811 Vitreous degeneration, right eye: Secondary | ICD-10-CM | POA: Diagnosis not present

## 2016-05-20 DIAGNOSIS — G4733 Obstructive sleep apnea (adult) (pediatric): Secondary | ICD-10-CM | POA: Diagnosis not present

## 2016-05-26 ENCOUNTER — Encounter: Payer: Self-pay | Admitting: Neurology

## 2016-05-28 ENCOUNTER — Ambulatory Visit (INDEPENDENT_AMBULATORY_CARE_PROVIDER_SITE_OTHER): Payer: BLUE CROSS/BLUE SHIELD | Admitting: Neurology

## 2016-05-28 ENCOUNTER — Encounter: Payer: Self-pay | Admitting: Neurology

## 2016-05-28 VITALS — BP 126/68 | HR 44 | Resp 16 | Ht 65.0 in | Wt 183.0 lb

## 2016-05-28 DIAGNOSIS — G4733 Obstructive sleep apnea (adult) (pediatric): Secondary | ICD-10-CM

## 2016-05-28 DIAGNOSIS — F458 Other somatoform disorders: Secondary | ICD-10-CM | POA: Diagnosis not present

## 2016-05-28 DIAGNOSIS — Z9989 Dependence on other enabling machines and devices: Secondary | ICD-10-CM | POA: Diagnosis not present

## 2016-05-28 NOTE — Progress Notes (Signed)
Subjective:    Patient ID: [name] is a 60 y.o. female.  HPI     Interim history:   [name] is a very pleasant 60 year old right-handed woman with an underlying medical history of hypertension, OCD and mood disorder who presents for follow-up consultation of her obstructive sleep apnea, after her recent sleep study. The patient is unaccompanied today. I last saw her on 12/13/2015, at which time she was referred by her cardiologist for reevaluation. I had seen her prior to that in September 2014 after sleep studies. At her visit on 12/13/2015 she reported that she had stopped using CPAP. She used it perhaps for a year. She also has suffered from low back pain and was not able to exercise. She had some changes to her medications through her psychiatrist. She was no longer on Depakote or Lamictal. She was on Vyvanse and Trintellix. She had gained weight since her original sleep study, about 15 pounds. She was more tired during the day. I suggested she return for full night CPAP re-titration study. She had this on 02/02/2016. Sleep efficiency was 84.6%, sleep latency was 13.5 minutes and wake after sleep onset was 57.5 minutes with moderate to severe sleep fragmentation noted. She had an increased percentage of stage I sleep, an increased percentage of slow-wave sleep and a decreased percentage of REM sleep with a markedly prolonged REM latency of 257.5 minutes. She had no significant PLMS, she had no significant EKG changes. Average heart rate however was 44. Average oxygen saturation was 93%, nadir was 88%. CPAP was started on 5 cm and increase to 6 cm in which AHI was 0 per hour. Nonsupine REM sleep was achieved on the final pressure. Based on her test results are prescribed CPAP therapy for home use.  Today, 05/28/2016: I reviewed her CPAP compliance data from 04/27/2016 through 05/26/2016 which is a total of 30 days, during which time she used her CPAP 28 days with percent used days  greater than 4 hours and 83%, indicating very good compliance with an average usage of 6 hours and 34 minutes, residual AHI 2.8 per hour, leak low for the 95th percentile at 1.8 L/m on a pressure of 6 cm with EPR.  Today, 05/28/2016: She reports doing okay, has adjusted fairly well, but has a tendency to clench her teeth at night.  In March 2017 she developed SVT and saw cardiology after going to the emergency room. She was in the process of being evaluated for an ablation procedure. She had an interim SVT ablation procedure on 01/01/2016 and I reviewed the records. In the interim, she presented to the emergency room on 01/17/2016 with recurrence of tachycardia, was diagnosed with recurrence of SVT. Symptoms improved with adenosine. She may be seeking a second opinion at [facility], she is looking into it. Stable mood. May need to switch to another psych provider.   Previously:    04/06/2013: She presents for follow-up consultation after a recent set of sleep studies. I had originally met her at the request of her psychiatrist on 12/10/2012, at which time she reported a prior sleep study some 15 years ago but no diagnosis of obstructive sleep apnea. She has recently been experiencing worsening daytime somnolence and snoring. Her baseline sleep study and her CPAP titration study were discussed. Her baseline sleep study was from 12/24/2012 and demonstrated a total AHI of 11 per hour, supine AHI of 23.3 per hour and a REM related AHI of 30 per hour with an oxyhemoglobin  desaturation nadir of 81%. Based on those test results I requested that she come back for a CPAP titration study which she had on 01/14/2013. Sleep efficiency was reduced at 75.7% with a normal sleep latency. Wake after sleep onset was increased at 89 minutes with mild sleep fragmentation noted. Her arousal index was low. Her sleep architecture showed a normal percentage of stage I and stage II sleep, and he increased percentage of slow-wave sleep at  34.4% and a mildly decreased percentage of REM sleep at 14% with a normal REM latency. She had no significant periodic leg movements. She had CPAP from 5 cm to 6 cm of pressure and had almost complete resolution of her sleep disordered breathing on 6 cm of water pressure with a medium nasal pillows mask. I reviewed compliance data from 03/01/2013 through 03/16/2013 which is a total of 15 days during which time she used it every day except for one day. Her percent use stays greater than 4 hours was 88% with an average usage for all days of 6 hours and 41 minutes. Her residual AHI was 6.5.  I also reviewed her compliance data from 03/01/13 to 03/31/13 which is a total of 29 days, during which time she used it every day except for 2 days. The average usage for all days was 5 hours and 56 minutes. The percent used days greater than 4 hours was 94%, indicating excellent compliance. The residual AHI was 4.3 per hour, indicating an appropriate treatment pressure of 6 cwp. .   She goes to bed by MN and it takes her a long time to fall asleep. She has been on Lamictal with a recent increase, and Depakote and has started a new antidepressant. She has been on Dextrostat for the past 2 years prn. She takes 10 mg in the morning. She wakes up around 10 AM. She falls asleep around 4 PM and may take a 2 hour nap. She has not necessarily noted an improvement with CPAP, and indicates full compliance. She has occasional sinus congestion. She takes Flonase. She has not been using a nasal rinse system.  Her Past Medical History Is Significant For: Past Medical History:  Diagnosis Date  . Anxiety   . Daytime somnolence 04/06/2013  . DDD (degenerative disc disease), lumbar   . Depression   . Hypercholesteremia   . Hypertension   . OSA on CPAP 04/06/2013  . Urinary incontinence     Her Past Surgical History Is Significant For: Past Surgical History:  Procedure Laterality Date  . ELECTROPHYSIOLOGIC STUDY N/A 01/01/2016    Procedure: SVT Ablation;  Surgeon: Evans Lance, MD;  Location: [facility];  Service: Cardiovascular;  Laterality: N/A;  . HAND SURGERY Right   . KNEE ARTHROPLASTY Left   . NASAL SEPTUM SURGERY    . RADICAL HYSTERECTOMY  2000  . TONSILLECTOMY AND ADENOIDECTOMY      Her Family History Is Significant For: Family History  Problem Relation Age of Onset  . Diabetes Maternal Grandmother   . Heart disease Father   . Hypertension Father   . Stroke Father     Her Social History Is Significant For: Social History   Social History  . Marital status: Married    Spouse name: N/A  . Number of children: N/A  . Years of education: N/A   Social History Main Topics  . Smoking status: Never Smoker  . Smokeless tobacco: Never Used  . Alcohol use 0.0 oz/week  . Drug use:  No  . Sexual activity: Yes    Partners: Male    Birth control/ protection: Surgical     Comment: hysterectomy   Other Topics Concern  . None   Social History Narrative  . None    Her Allergies Are:  No Known Allergies:   Her Current Medications Are:  Outpatient Encounter Prescriptions as of 05/28/2016  Medication Sig  . aspirin 81 MG tablet Take 81 mg by mouth daily.  Marland Kitchen atenolol (TENORMIN) 100 MG tablet Take 50 mg by mouth daily.  . Calcium Carbonate-Vitamin D 600-400 MG-UNIT chew tablet Chew 1 tablet by mouth 2 (two) times daily.  . fluticasone (FLONASE) 50 MCG/ACT nasal spray Place 1 spray into both nostrils daily as needed for allergies.   Marland Kitchen lisdexamfetamine (VYVANSE) 40 MG capsule Take 40 mg by mouth every morning.  . Olmesartan-Amlodipine-HCTZ 40-10-25 MG TABS Take 1 tablet by mouth every morning.  . Vortioxetine HBr (TRINTELLIX) 20 MG TABS Take 20 mg by mouth daily.   No facility-administered encounter medications on file as of 05/28/2016.   :  Review of Systems:  Out of a complete 14 point review of systems, all are reviewed and negative with the exception of these symptoms as listed  below:  Review of Systems  Neurological:       Patient states that she is doing ok with CPAP. Reports that she grits her teeth together when using CPAP.     Objective:  Neurologic Exam  Physical Exam Physical Examination:   Vitals:   05/28/16 1300  BP: 126/68  Pulse: (!) 44  Resp: 16    General Examination: The patient is a very pleasant 60 y.o. female in no acute distress. She appears well-developed and well-nourished and well groomed. She is not as anxious appearing, good spirits today.  HEENT: Normocephalic, atraumatic, pupils are equal, round and reactive to light and accommodation. Extraocular tracking is good without limitation to gaze excursion or nystagmus noted. Normal smooth pursuit is noted. Hearing is grossly intact. Face is symmetric with normal facial animation and normal facial sensation. Speech is clear with no dysarthria noted. There is no hypophonia. There is no lip, neck/head, jaw or voice tremor. Neck is supple with full range of passive and active motion. There are no carotid bruits on auscultation. Oropharynx exam reveals: good dental hygiene and minimal airway crowding, due to floppy soft palate. Mallampati is class II. Tongue protrudes centrally and palate elevates symmetrically. Tonsils are absent.   Chest: Clear to auscultation without wheezing, rhonchi or crackles noted.  Heart: S1+S2+0, regular and normal without murmurs, rubs or gallops noted.   Abdomen: Soft, non-tender and non-distended with normal bowel sounds appreciated on auscultation.  Extremities: There is no pitting edema in the distal lower extremities bilaterall  Skin: Warm and dry without trophic changes noted. There are no varicose veins.  Musculoskeletal: exam reveals no obvious joint deformities, tenderness or joint swelling or erythema.   Neurologically:  Mental status: The patient is awake, alert and oriented in all 4 spheres. Her memory, attention, language and knowledge are  appropriate. There is no aphasia, agnosia, apraxia or anomia. Speech is clear with normal prosody and enunciation. Thought process is linear. Mood is congruent and affect is fairly normal today.  Cranial nerves are as described above under HEENT exam. In addition, shoulder shrug is normal with equal shoulder height noted. Motor exam: Normal bulk, strength and tone is noted. There is no drift, tremor or rebound. Romberg is negative. Reflexes are 2+ throughout.  Fine motor skills are intact in the UEs and LEs today.   Cerebellar testing shows no dysmetria or intention tremor on finger to nose testing. There is no truncal or gait ataxia.  Sensory exam is intact to light touch in the upper and lower extremities.  Gait, station and balance are unremarkable. No veering to one side is noted. No leaning to one side is noted. Posture is age-appropriate and stance is narrow based. No problems turning are noted. She turns en bloc.                Assessment and Plan:   In summary, [name] is a very pleasant 60 year old female with a history depression, anxiety, thyroid dysfunction, and recent diagnosis of SVT, status post ablation in June 2017, who presents for follow-up consultation of her obstructive sleep apnea. She had a baseline sleep study on 12/24/2012 and had a CPAP titration study in June 2014 on 01/14/2013, was lost to follow-up for a couple of years and presented for reevaluation of her OSA, now status post CPAP re-titration study in July 2017. We talked about her study results in detail today. She has been compliant with CPAP therapy and is doing reasonably well. She is commended for her treatment adherence. She is complaining of jaw clenching and tooth grinding while using her CPAP. She is currently using a nasal mask but also was given nasal pillows. She is encouraged to try the pillows and maybe even alternate in between 2 interfaces. Furthermore, she is encouraged to seek consultation with a  dentist, perhaps a specialized dentist that also treats obstructive sleep apnea and is familiar with CPAP as well. She was given some names and we'll look into this. Physical exam and neurological exam are stable, thankfully, mood is stable on the current regimen. I would like to see her back in 6 months, sooner as needed. I answered all her questions today and she was in agreement. I spent 25 minutes in total face-to-face time with the patient, more than 50% of which was spent in counseling and coordination of care, reviewing test results, reviewing medication and discussing or reviewing the diagnosis of OSA, its prognosis and treatment options.

## 2016-05-28 NOTE — Patient Instructions (Signed)
Please continue using your CPAP regularly. While your insurance requires that you use CPAP at least 4 hours each night on 70% of the nights, I recommend, that you not skip any nights and use it throughout the night if you can. Getting used to CPAP and staying with the treatment long term does take time and patience and discipline. Untreated obstructive sleep apnea when it is moderate to severe can have an adverse impact on cardiovascular health and raise her risk for heart disease, arrhythmias, hypertension, congestive heart failure, stroke and diabetes. Untreated obstructive sleep apnea causes sleep disruption, nonrestorative sleep, and sleep deprivation. This can have an impact on your day to day functioning and cause daytime sleepiness and impairment of cognitive function, memory loss, mood disturbance, and problems focussing. Using CPAP regularly can improve these symptoms.  You can contact another dentist practice to see if they can help with your tooth grinding while on CPAP.

## 2016-05-30 DIAGNOSIS — Z86010 Personal history of colonic polyps: Secondary | ICD-10-CM | POA: Diagnosis not present

## 2016-05-30 DIAGNOSIS — K5901 Slow transit constipation: Secondary | ICD-10-CM | POA: Diagnosis not present

## 2016-05-31 DIAGNOSIS — I10 Essential (primary) hypertension: Secondary | ICD-10-CM | POA: Diagnosis not present

## 2016-05-31 DIAGNOSIS — I471 Supraventricular tachycardia: Secondary | ICD-10-CM | POA: Diagnosis not present

## 2016-05-31 DIAGNOSIS — G4733 Obstructive sleep apnea (adult) (pediatric): Secondary | ICD-10-CM | POA: Diagnosis not present

## 2016-05-31 DIAGNOSIS — M109 Gout, unspecified: Secondary | ICD-10-CM | POA: Diagnosis not present

## 2016-06-06 ENCOUNTER — Ambulatory Visit: Payer: BLUE CROSS/BLUE SHIELD | Admitting: Neurology

## 2016-06-20 DIAGNOSIS — G4733 Obstructive sleep apnea (adult) (pediatric): Secondary | ICD-10-CM | POA: Diagnosis not present

## 2016-07-08 DIAGNOSIS — F411 Generalized anxiety disorder: Secondary | ICD-10-CM | POA: Diagnosis not present

## 2016-07-08 DIAGNOSIS — F422 Mixed obsessional thoughts and acts: Secondary | ICD-10-CM | POA: Diagnosis not present

## 2016-07-20 DIAGNOSIS — G4733 Obstructive sleep apnea (adult) (pediatric): Secondary | ICD-10-CM | POA: Diagnosis not present

## 2016-08-09 DIAGNOSIS — D490 Neoplasm of unspecified behavior of digestive system: Secondary | ICD-10-CM | POA: Diagnosis not present

## 2016-08-09 DIAGNOSIS — D126 Benign neoplasm of colon, unspecified: Secondary | ICD-10-CM | POA: Diagnosis not present

## 2016-08-09 DIAGNOSIS — Z86010 Personal history of colonic polyps: Secondary | ICD-10-CM | POA: Diagnosis not present

## 2016-08-09 DIAGNOSIS — K635 Polyp of colon: Secondary | ICD-10-CM | POA: Diagnosis not present

## 2016-08-09 DIAGNOSIS — D123 Benign neoplasm of transverse colon: Secondary | ICD-10-CM | POA: Diagnosis not present

## 2016-08-16 DIAGNOSIS — D126 Benign neoplasm of colon, unspecified: Secondary | ICD-10-CM | POA: Diagnosis not present

## 2016-08-16 DIAGNOSIS — K635 Polyp of colon: Secondary | ICD-10-CM | POA: Diagnosis not present

## 2016-08-19 DIAGNOSIS — H2513 Age-related nuclear cataract, bilateral: Secondary | ICD-10-CM | POA: Diagnosis not present

## 2016-08-19 DIAGNOSIS — H15102 Unspecified episcleritis, left eye: Secondary | ICD-10-CM | POA: Diagnosis not present

## 2016-08-19 DIAGNOSIS — H40013 Open angle with borderline findings, low risk, bilateral: Secondary | ICD-10-CM | POA: Diagnosis not present

## 2016-08-19 DIAGNOSIS — H25013 Cortical age-related cataract, bilateral: Secondary | ICD-10-CM | POA: Diagnosis not present

## 2016-08-20 DIAGNOSIS — G4733 Obstructive sleep apnea (adult) (pediatric): Secondary | ICD-10-CM | POA: Diagnosis not present

## 2016-09-09 ENCOUNTER — Other Ambulatory Visit: Payer: Self-pay | Admitting: Gastroenterology

## 2016-09-09 DIAGNOSIS — K388 Other specified diseases of appendix: Secondary | ICD-10-CM

## 2016-09-09 DIAGNOSIS — K389 Disease of appendix, unspecified: Secondary | ICD-10-CM | POA: Diagnosis not present

## 2016-09-12 ENCOUNTER — Ambulatory Visit
Admission: RE | Admit: 2016-09-12 | Discharge: 2016-09-12 | Disposition: A | Discharge status: Home / Self Care | Payer: BLUE CROSS/BLUE SHIELD | Source: Ambulatory Visit | Attending: Gastroenterology | Admitting: Gastroenterology

## 2016-09-12 DIAGNOSIS — K388 Other specified diseases of appendix: Secondary | ICD-10-CM

## 2016-09-12 MED ORDER — IOPAMIDOL (ISOVUE-300) INJECTION 61%
100.0000 mL | Freq: Once | INTRAVENOUS | Status: AC | PRN
  Administered 2016-09-12: 100 mL via INTRAVENOUS

## 2016-09-16 DIAGNOSIS — R0609 Other forms of dyspnea: Secondary | ICD-10-CM | POA: Diagnosis not present

## 2016-09-16 DIAGNOSIS — G4733 Obstructive sleep apnea (adult) (pediatric): Secondary | ICD-10-CM | POA: Diagnosis not present

## 2016-09-16 DIAGNOSIS — I471 Supraventricular tachycardia: Secondary | ICD-10-CM | POA: Diagnosis not present

## 2016-09-16 DIAGNOSIS — I10 Essential (primary) hypertension: Secondary | ICD-10-CM | POA: Diagnosis not present

## 2016-09-20 DIAGNOSIS — G4733 Obstructive sleep apnea (adult) (pediatric): Secondary | ICD-10-CM | POA: Diagnosis not present

## 2016-09-26 ENCOUNTER — Ambulatory Visit: Payer: Self-pay | Admitting: General Surgery

## 2016-09-26 DIAGNOSIS — D120 Benign neoplasm of cecum: Secondary | ICD-10-CM | POA: Diagnosis not present

## 2016-09-26 DIAGNOSIS — K828 Other specified diseases of gallbladder: Secondary | ICD-10-CM | POA: Diagnosis not present

## 2016-09-30 DIAGNOSIS — F411 Generalized anxiety disorder: Secondary | ICD-10-CM | POA: Diagnosis not present

## 2016-10-08 DIAGNOSIS — I10 Essential (primary) hypertension: Secondary | ICD-10-CM | POA: Diagnosis not present

## 2016-10-08 DIAGNOSIS — K389 Disease of appendix, unspecified: Secondary | ICD-10-CM | POA: Diagnosis not present

## 2016-10-08 DIAGNOSIS — G4730 Sleep apnea, unspecified: Secondary | ICD-10-CM | POA: Diagnosis not present

## 2016-10-14 DIAGNOSIS — G4733 Obstructive sleep apnea (adult) (pediatric): Secondary | ICD-10-CM | POA: Diagnosis not present

## 2016-10-18 DIAGNOSIS — G4733 Obstructive sleep apnea (adult) (pediatric): Secondary | ICD-10-CM | POA: Diagnosis not present

## 2016-10-31 DIAGNOSIS — G4733 Obstructive sleep apnea (adult) (pediatric): Secondary | ICD-10-CM | POA: Diagnosis not present

## 2016-10-31 DIAGNOSIS — I471 Supraventricular tachycardia: Secondary | ICD-10-CM | POA: Diagnosis not present

## 2016-10-31 DIAGNOSIS — I10 Essential (primary) hypertension: Secondary | ICD-10-CM | POA: Diagnosis not present

## 2016-10-31 DIAGNOSIS — R001 Bradycardia, unspecified: Secondary | ICD-10-CM | POA: Diagnosis not present

## 2016-10-31 DIAGNOSIS — K3580 Unspecified acute appendicitis: Secondary | ICD-10-CM | POA: Diagnosis not present

## 2016-10-31 DIAGNOSIS — Z7982 Long term (current) use of aspirin: Secondary | ICD-10-CM | POA: Diagnosis not present

## 2016-10-31 DIAGNOSIS — Z79899 Other long term (current) drug therapy: Secondary | ICD-10-CM | POA: Diagnosis not present

## 2016-10-31 DIAGNOSIS — F329 Major depressive disorder, single episode, unspecified: Secondary | ICD-10-CM | POA: Diagnosis not present

## 2016-10-31 DIAGNOSIS — K7689 Other specified diseases of liver: Secondary | ICD-10-CM | POA: Diagnosis not present

## 2016-11-06 DIAGNOSIS — K828 Other specified diseases of gallbladder: Secondary | ICD-10-CM | POA: Diagnosis not present

## 2016-11-06 DIAGNOSIS — Z79899 Other long term (current) drug therapy: Secondary | ICD-10-CM | POA: Diagnosis not present

## 2016-11-06 DIAGNOSIS — F329 Major depressive disorder, single episode, unspecified: Secondary | ICD-10-CM | POA: Diagnosis not present

## 2016-11-06 DIAGNOSIS — I471 Supraventricular tachycardia: Secondary | ICD-10-CM | POA: Diagnosis not present

## 2016-11-06 DIAGNOSIS — K3580 Unspecified acute appendicitis: Secondary | ICD-10-CM | POA: Diagnosis not present

## 2016-11-06 DIAGNOSIS — G8918 Other acute postprocedural pain: Secondary | ICD-10-CM | POA: Diagnosis not present

## 2016-11-06 DIAGNOSIS — I10 Essential (primary) hypertension: Secondary | ICD-10-CM | POA: Diagnosis not present

## 2016-11-06 DIAGNOSIS — G4733 Obstructive sleep apnea (adult) (pediatric): Secondary | ICD-10-CM | POA: Diagnosis not present

## 2016-11-06 DIAGNOSIS — Z7982 Long term (current) use of aspirin: Secondary | ICD-10-CM | POA: Diagnosis not present

## 2016-11-06 DIAGNOSIS — K7689 Other specified diseases of liver: Secondary | ICD-10-CM | POA: Diagnosis not present

## 2016-11-06 DIAGNOSIS — K3589 Other acute appendicitis: Secondary | ICD-10-CM | POA: Diagnosis not present

## 2016-11-07 DIAGNOSIS — I471 Supraventricular tachycardia: Secondary | ICD-10-CM | POA: Diagnosis not present

## 2016-11-07 DIAGNOSIS — F329 Major depressive disorder, single episode, unspecified: Secondary | ICD-10-CM | POA: Diagnosis not present

## 2016-11-07 DIAGNOSIS — Z7982 Long term (current) use of aspirin: Secondary | ICD-10-CM | POA: Diagnosis not present

## 2016-11-07 DIAGNOSIS — I10 Essential (primary) hypertension: Secondary | ICD-10-CM | POA: Diagnosis not present

## 2016-11-07 DIAGNOSIS — Z79899 Other long term (current) drug therapy: Secondary | ICD-10-CM | POA: Diagnosis not present

## 2016-11-07 DIAGNOSIS — K7689 Other specified diseases of liver: Secondary | ICD-10-CM | POA: Diagnosis not present

## 2016-11-07 DIAGNOSIS — G4733 Obstructive sleep apnea (adult) (pediatric): Secondary | ICD-10-CM | POA: Diagnosis not present

## 2016-11-07 DIAGNOSIS — K3580 Unspecified acute appendicitis: Secondary | ICD-10-CM | POA: Diagnosis not present

## 2016-11-13 DIAGNOSIS — M109 Gout, unspecified: Secondary | ICD-10-CM | POA: Diagnosis not present

## 2016-11-13 DIAGNOSIS — M859 Disorder of bone density and structure, unspecified: Secondary | ICD-10-CM | POA: Diagnosis not present

## 2016-11-13 DIAGNOSIS — I10 Essential (primary) hypertension: Secondary | ICD-10-CM | POA: Diagnosis not present

## 2016-11-13 DIAGNOSIS — E042 Nontoxic multinodular goiter: Secondary | ICD-10-CM | POA: Diagnosis not present

## 2016-11-13 DIAGNOSIS — E784 Other hyperlipidemia: Secondary | ICD-10-CM | POA: Diagnosis not present

## 2016-11-18 DIAGNOSIS — G4733 Obstructive sleep apnea (adult) (pediatric): Secondary | ICD-10-CM | POA: Diagnosis not present

## 2016-11-21 DIAGNOSIS — Z0000 Encounter for general adult medical examination without abnormal findings: Secondary | ICD-10-CM | POA: Diagnosis not present

## 2016-11-21 DIAGNOSIS — E784 Other hyperlipidemia: Secondary | ICD-10-CM | POA: Diagnosis not present

## 2016-11-21 DIAGNOSIS — I10 Essential (primary) hypertension: Secondary | ICD-10-CM | POA: Diagnosis not present

## 2016-11-21 DIAGNOSIS — I471 Supraventricular tachycardia: Secondary | ICD-10-CM | POA: Diagnosis not present

## 2016-11-21 DIAGNOSIS — M8580 Other specified disorders of bone density and structure, unspecified site: Secondary | ICD-10-CM | POA: Diagnosis not present

## 2016-11-27 ENCOUNTER — Ambulatory Visit: Payer: BLUE CROSS/BLUE SHIELD | Admitting: Neurology

## 2016-12-12 DIAGNOSIS — M2042 Other hammer toe(s) (acquired), left foot: Secondary | ICD-10-CM | POA: Diagnosis not present

## 2016-12-12 DIAGNOSIS — M722 Plantar fascial fibromatosis: Secondary | ICD-10-CM | POA: Diagnosis not present

## 2016-12-12 DIAGNOSIS — M2041 Other hammer toe(s) (acquired), right foot: Secondary | ICD-10-CM | POA: Diagnosis not present

## 2016-12-18 DIAGNOSIS — H5213 Myopia, bilateral: Secondary | ICD-10-CM | POA: Diagnosis not present

## 2016-12-18 DIAGNOSIS — H40012 Open angle with borderline findings, low risk, left eye: Secondary | ICD-10-CM | POA: Diagnosis not present

## 2016-12-18 DIAGNOSIS — G4733 Obstructive sleep apnea (adult) (pediatric): Secondary | ICD-10-CM | POA: Diagnosis not present

## 2016-12-18 DIAGNOSIS — H04213 Epiphora due to excess lacrimation, bilateral lacrimal glands: Secondary | ICD-10-CM | POA: Diagnosis not present

## 2016-12-18 DIAGNOSIS — H179 Unspecified corneal scar and opacity: Secondary | ICD-10-CM | POA: Diagnosis not present

## 2016-12-18 DIAGNOSIS — H40011 Open angle with borderline findings, low risk, right eye: Secondary | ICD-10-CM | POA: Diagnosis not present

## 2016-12-18 DIAGNOSIS — H40013 Open angle with borderline findings, low risk, bilateral: Secondary | ICD-10-CM | POA: Diagnosis not present

## 2017-01-02 DIAGNOSIS — F411 Generalized anxiety disorder: Secondary | ICD-10-CM | POA: Diagnosis not present

## 2017-02-07 DIAGNOSIS — F39 Unspecified mood [affective] disorder: Secondary | ICD-10-CM | POA: Diagnosis not present

## 2017-02-25 DIAGNOSIS — F411 Generalized anxiety disorder: Secondary | ICD-10-CM | POA: Diagnosis not present

## 2017-03-05 DIAGNOSIS — F39 Unspecified mood [affective] disorder: Secondary | ICD-10-CM | POA: Diagnosis not present

## 2017-03-13 ENCOUNTER — Other Ambulatory Visit: Payer: Self-pay | Admitting: Internal Medicine

## 2017-03-13 DIAGNOSIS — Z1231 Encounter for screening mammogram for malignant neoplasm of breast: Secondary | ICD-10-CM

## 2017-03-19 DIAGNOSIS — G4733 Obstructive sleep apnea (adult) (pediatric): Secondary | ICD-10-CM | POA: Diagnosis not present

## 2017-03-19 DIAGNOSIS — I10 Essential (primary) hypertension: Secondary | ICD-10-CM | POA: Diagnosis not present

## 2017-03-19 DIAGNOSIS — I471 Supraventricular tachycardia: Secondary | ICD-10-CM | POA: Diagnosis not present

## 2017-03-24 ENCOUNTER — Ambulatory Visit
Admission: RE | Admit: 2017-03-24 | Discharge: 2017-03-24 | Disposition: A | Discharge status: Home / Self Care | Payer: BLUE CROSS/BLUE SHIELD | Source: Ambulatory Visit | Attending: Internal Medicine | Admitting: Internal Medicine

## 2017-03-24 DIAGNOSIS — Z1231 Encounter for screening mammogram for malignant neoplasm of breast: Secondary | ICD-10-CM

## 2017-04-23 DIAGNOSIS — F411 Generalized anxiety disorder: Secondary | ICD-10-CM | POA: Diagnosis not present

## 2017-05-03 ENCOUNTER — Encounter: Payer: Self-pay | Admitting: Neurology

## 2017-05-12 DIAGNOSIS — I10 Essential (primary) hypertension: Secondary | ICD-10-CM | POA: Diagnosis not present

## 2017-05-12 DIAGNOSIS — G4733 Obstructive sleep apnea (adult) (pediatric): Secondary | ICD-10-CM | POA: Diagnosis not present

## 2017-05-12 DIAGNOSIS — M4802 Spinal stenosis, cervical region: Secondary | ICD-10-CM | POA: Diagnosis not present

## 2017-05-12 DIAGNOSIS — I471 Supraventricular tachycardia: Secondary | ICD-10-CM | POA: Diagnosis not present

## 2017-05-15 DIAGNOSIS — F39 Unspecified mood [affective] disorder: Secondary | ICD-10-CM | POA: Diagnosis not present

## 2017-06-09 IMAGING — US US SOFT TISSUE HEAD/NECK
1 series · 14 of 25 positions shown · non-contrast
Comparison: 06/10/2011

CLINICAL DATA: Follow-up thyroid nodule.

EXAM:
THYROID ULTRASOUND
TECHNIQUE: Ultrasound examination of the thyroid gland and adjacent soft
tissues was performed.

[Series 1: us soft tissue head/neck · 0.05mm/px · 14 of 55 slices shown]
[im 1/55]
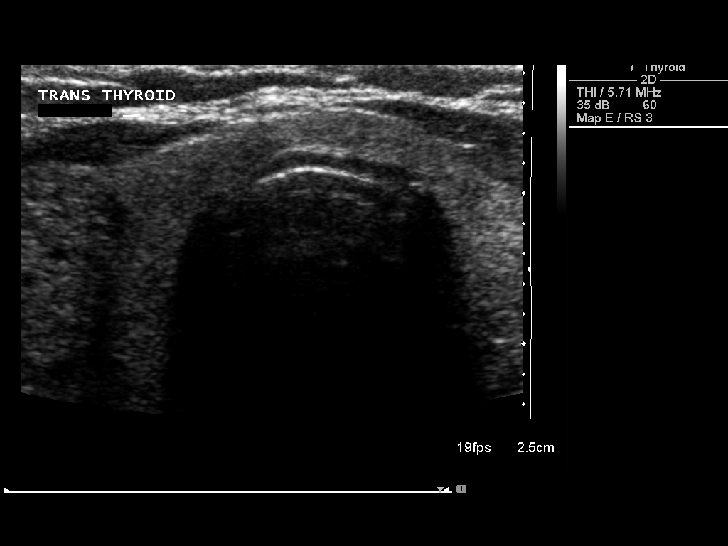
[im 5/55]
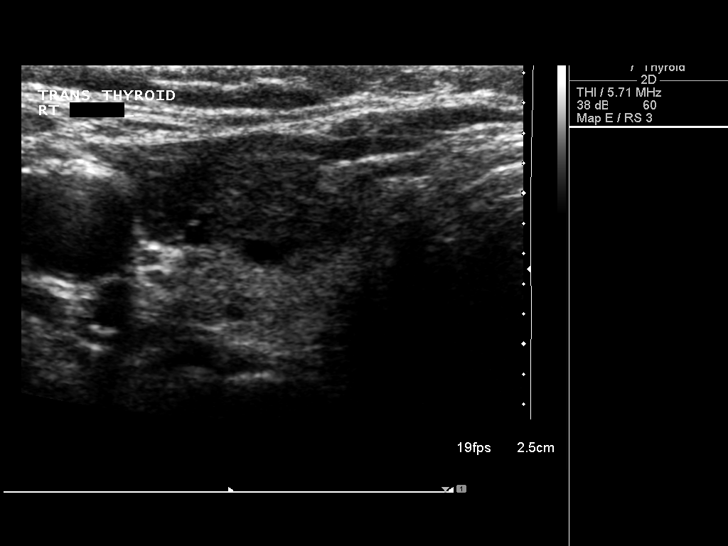
[im 10/55]
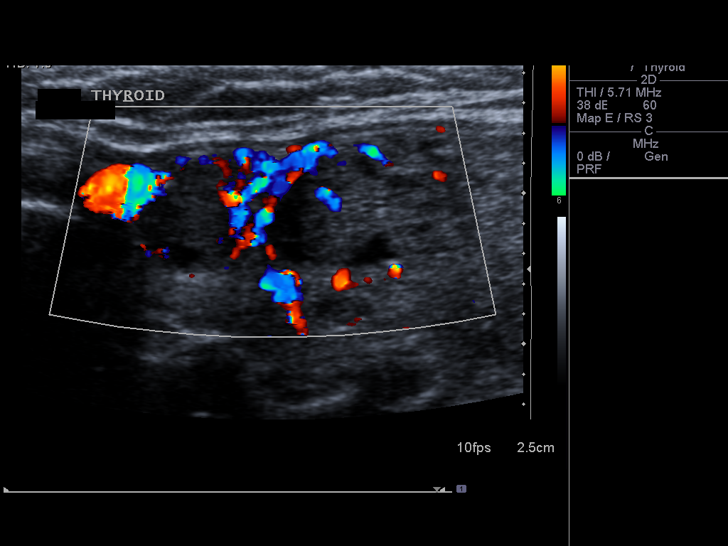
[im 14/55]
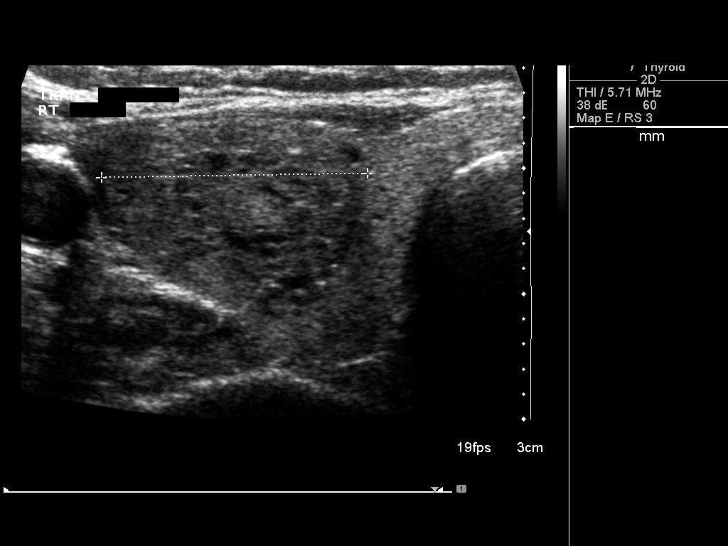
[im 19/55]
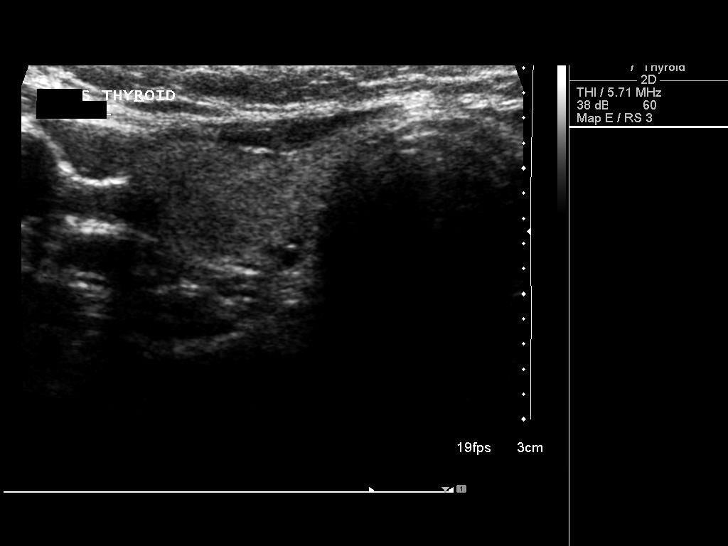
[im 21/55]
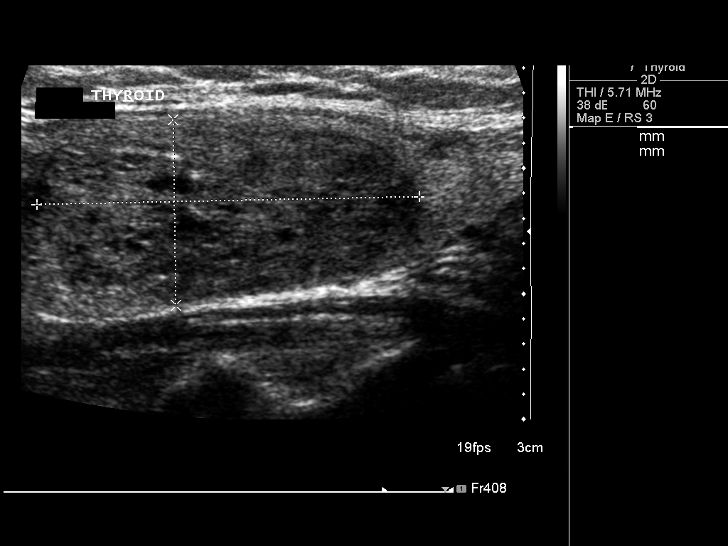
[im 25/55]
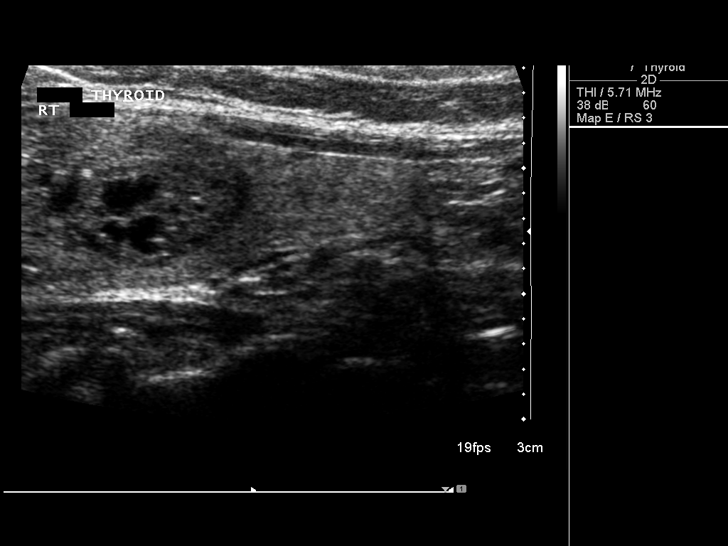
[im 30/55]
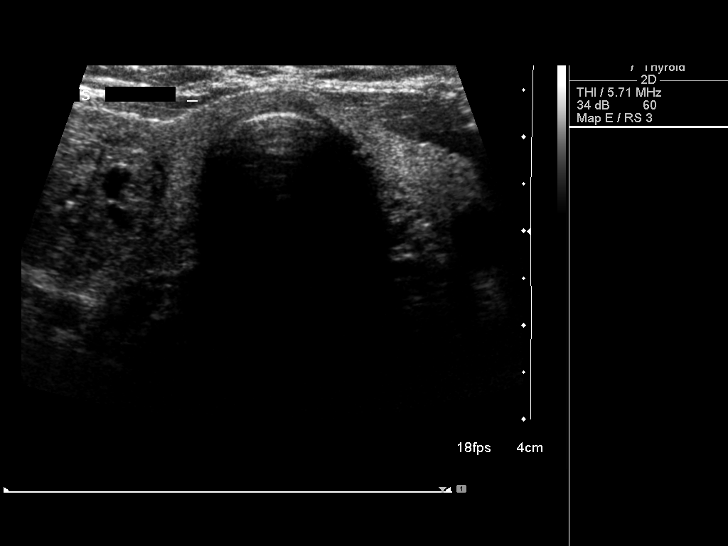
[im 34/55]
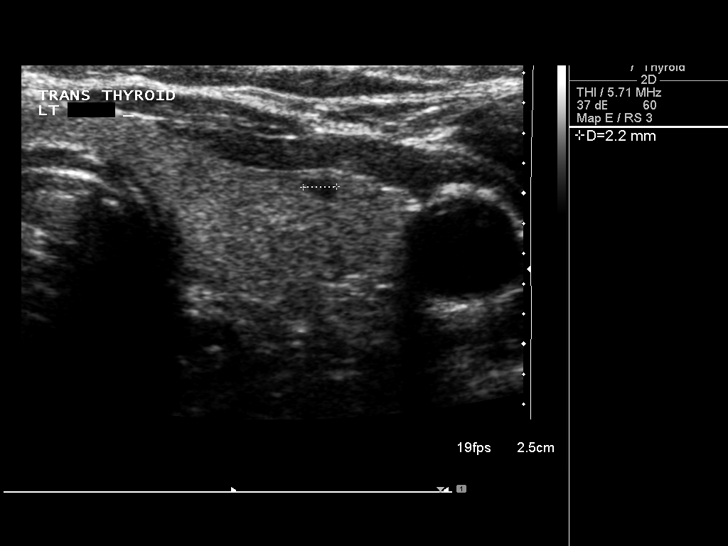
[im 37/55]
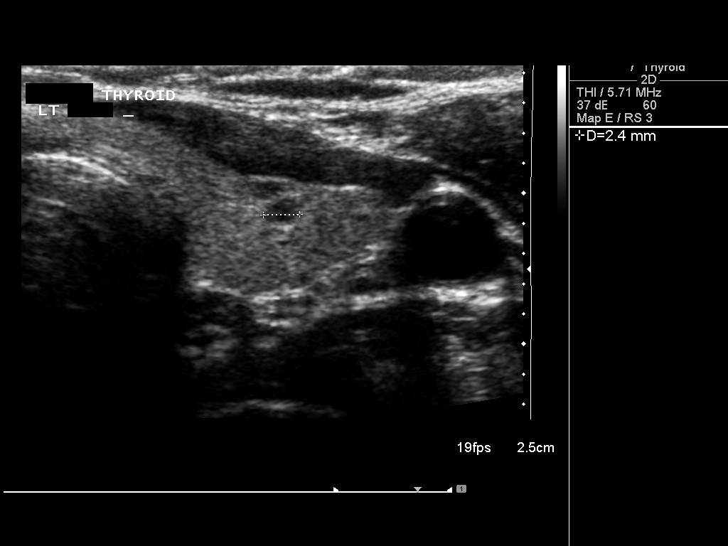
[im 41/55]
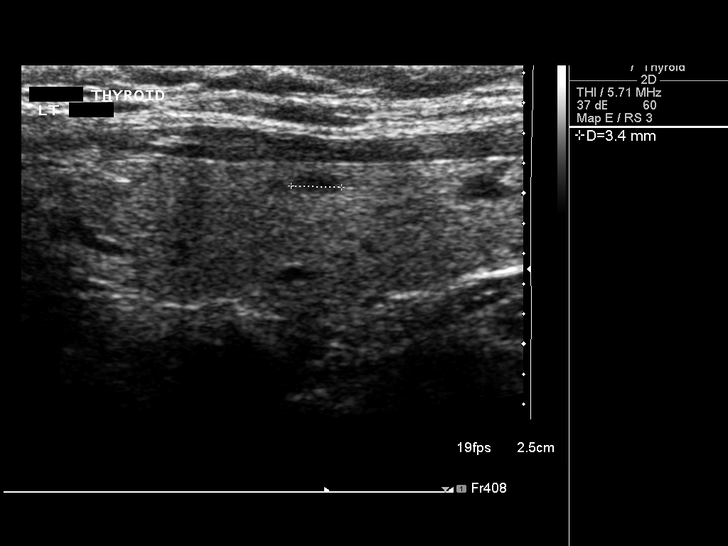
[im 46/55]
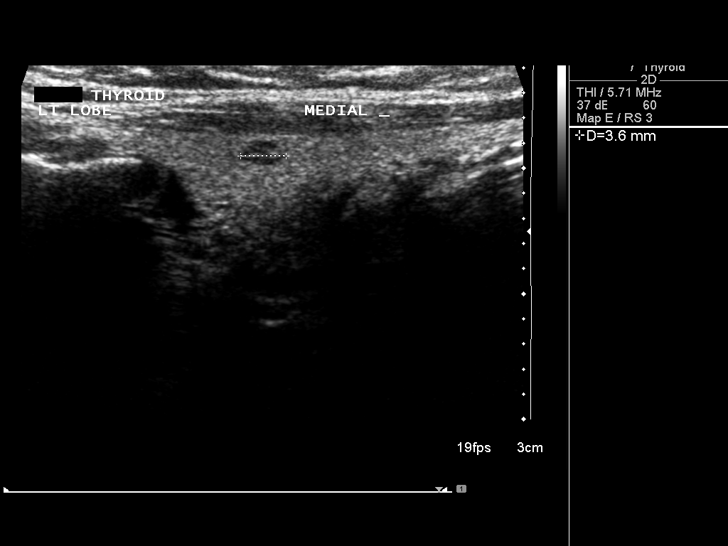
[im 50/55]
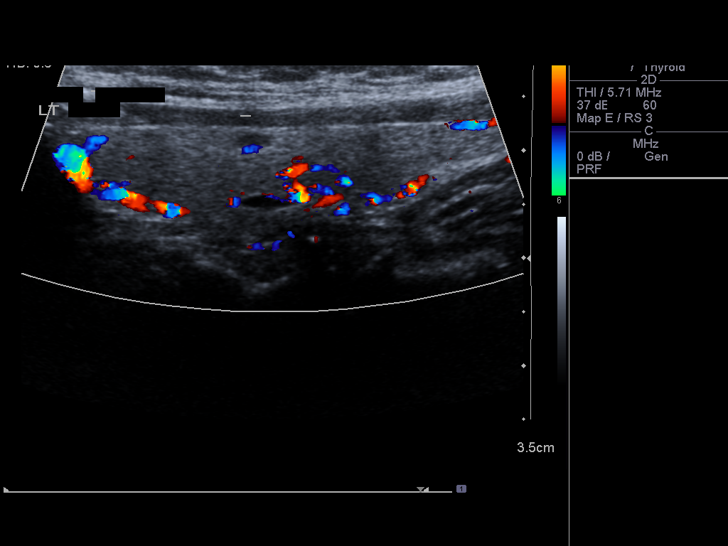
[im 55/55]
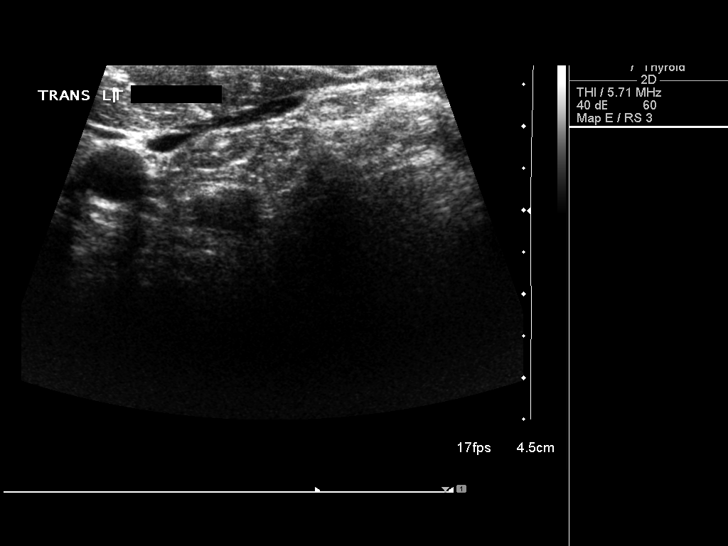

[14 of 25 positions shown; findings below may reference images not displayed]

FINDINGS: Right thyroid lobe

Measurements: 5.5 x 1.5 x 2.7 cm. Upper pole solid nodule with
calcification measures 9 mm and previously measured 11 mm. Mid lobe
nodule today measures 31 x 15 x 21 mm and previously measured 35 x
16 x 22 mm.

Left thyroid lobe

Measurements: 4.4 x 1.1 x 1.5 cm. Very small nodules measuring 4 mm
or less are scattered within the left lobe.

Isthmus

Thickness: 3 mm.  No nodules visualized.

Lymphadenopathy

None visualized.
IMPRESSION: Stable bilateral nodules. Dominant right lobe nodule today measures
up to 31 mm and previously measured up to 35 mm.

## 2017-07-10 DIAGNOSIS — M6740 Ganglion, unspecified site: Secondary | ICD-10-CM | POA: Diagnosis not present

## 2017-07-10 DIAGNOSIS — M2041 Other hammer toe(s) (acquired), right foot: Secondary | ICD-10-CM | POA: Diagnosis not present

## 2017-07-16 DIAGNOSIS — F429 Obsessive-compulsive disorder, unspecified: Secondary | ICD-10-CM | POA: Diagnosis not present

## 2017-08-12 DIAGNOSIS — G4733 Obstructive sleep apnea (adult) (pediatric): Secondary | ICD-10-CM | POA: Diagnosis not present

## 2017-08-21 DIAGNOSIS — H43811 Vitreous degeneration, right eye: Secondary | ICD-10-CM | POA: Diagnosis not present

## 2017-08-21 DIAGNOSIS — I471 Supraventricular tachycardia: Secondary | ICD-10-CM | POA: Diagnosis not present

## 2017-08-21 DIAGNOSIS — H2513 Age-related nuclear cataract, bilateral: Secondary | ICD-10-CM | POA: Diagnosis not present

## 2017-08-21 DIAGNOSIS — H40013 Open angle with borderline findings, low risk, bilateral: Secondary | ICD-10-CM | POA: Diagnosis not present

## 2017-08-21 DIAGNOSIS — H35361 Drusen (degenerative) of macula, right eye: Secondary | ICD-10-CM | POA: Diagnosis not present

## 2017-08-21 DIAGNOSIS — H25013 Cortical age-related cataract, bilateral: Secondary | ICD-10-CM | POA: Diagnosis not present

## 2017-08-29 DIAGNOSIS — R0609 Other forms of dyspnea: Secondary | ICD-10-CM | POA: Diagnosis not present

## 2017-08-29 DIAGNOSIS — I471 Supraventricular tachycardia: Secondary | ICD-10-CM | POA: Diagnosis not present

## 2017-09-03 DIAGNOSIS — F429 Obsessive-compulsive disorder, unspecified: Secondary | ICD-10-CM | POA: Diagnosis not present

## 2017-09-03 DIAGNOSIS — F900 Attention-deficit hyperactivity disorder, predominantly inattentive type: Secondary | ICD-10-CM | POA: Diagnosis not present

## 2017-09-03 DIAGNOSIS — F331 Major depressive disorder, recurrent, moderate: Secondary | ICD-10-CM | POA: Diagnosis not present

## 2017-09-26 DIAGNOSIS — R635 Abnormal weight gain: Secondary | ICD-10-CM | POA: Diagnosis not present

## 2017-09-26 DIAGNOSIS — G4733 Obstructive sleep apnea (adult) (pediatric): Secondary | ICD-10-CM | POA: Diagnosis not present

## 2017-09-26 DIAGNOSIS — I10 Essential (primary) hypertension: Secondary | ICD-10-CM | POA: Diagnosis not present

## 2017-09-26 DIAGNOSIS — R5383 Other fatigue: Secondary | ICD-10-CM | POA: Diagnosis not present

## 2017-09-26 DIAGNOSIS — I471 Supraventricular tachycardia: Secondary | ICD-10-CM | POA: Diagnosis not present

## 2017-09-29 DIAGNOSIS — I10 Essential (primary) hypertension: Secondary | ICD-10-CM | POA: Diagnosis not present

## 2017-09-29 DIAGNOSIS — I471 Supraventricular tachycardia: Secondary | ICD-10-CM | POA: Diagnosis not present

## 2017-09-29 HISTORY — PX: ABLATION: SHX5711

## 2017-10-12 DIAGNOSIS — J209 Acute bronchitis, unspecified: Secondary | ICD-10-CM | POA: Diagnosis not present

## 2017-10-12 DIAGNOSIS — J019 Acute sinusitis, unspecified: Secondary | ICD-10-CM | POA: Diagnosis not present

## 2017-10-15 DIAGNOSIS — J189 Pneumonia, unspecified organism: Secondary | ICD-10-CM | POA: Diagnosis not present

## 2017-10-15 DIAGNOSIS — R911 Solitary pulmonary nodule: Secondary | ICD-10-CM | POA: Diagnosis not present

## 2017-10-15 DIAGNOSIS — J111 Influenza due to unidentified influenza virus with other respiratory manifestations: Secondary | ICD-10-CM | POA: Diagnosis not present

## 2017-10-15 DIAGNOSIS — R05 Cough: Secondary | ICD-10-CM | POA: Diagnosis not present

## 2017-10-30 DIAGNOSIS — R05 Cough: Secondary | ICD-10-CM | POA: Diagnosis not present

## 2017-10-30 DIAGNOSIS — J189 Pneumonia, unspecified organism: Secondary | ICD-10-CM | POA: Diagnosis not present

## 2017-10-30 DIAGNOSIS — R911 Solitary pulmonary nodule: Secondary | ICD-10-CM | POA: Diagnosis not present

## 2017-10-30 DIAGNOSIS — J302 Other seasonal allergic rhinitis: Secondary | ICD-10-CM | POA: Diagnosis not present

## 2017-11-05 DIAGNOSIS — F900 Attention-deficit hyperactivity disorder, predominantly inattentive type: Secondary | ICD-10-CM | POA: Diagnosis not present

## 2017-11-05 DIAGNOSIS — F331 Major depressive disorder, recurrent, moderate: Secondary | ICD-10-CM | POA: Diagnosis not present

## 2017-11-19 DIAGNOSIS — Z0000 Encounter for general adult medical examination without abnormal findings: Secondary | ICD-10-CM | POA: Diagnosis not present

## 2017-11-19 DIAGNOSIS — I10 Essential (primary) hypertension: Secondary | ICD-10-CM | POA: Diagnosis not present

## 2017-11-19 DIAGNOSIS — E042 Nontoxic multinodular goiter: Secondary | ICD-10-CM | POA: Diagnosis not present

## 2017-11-19 DIAGNOSIS — R82998 Other abnormal findings in urine: Secondary | ICD-10-CM | POA: Diagnosis not present

## 2017-11-19 DIAGNOSIS — M109 Gout, unspecified: Secondary | ICD-10-CM | POA: Diagnosis not present

## 2017-11-19 DIAGNOSIS — M859 Disorder of bone density and structure, unspecified: Secondary | ICD-10-CM | POA: Diagnosis not present

## 2017-11-26 DIAGNOSIS — E7849 Other hyperlipidemia: Secondary | ICD-10-CM | POA: Diagnosis not present

## 2017-11-26 DIAGNOSIS — I10 Essential (primary) hypertension: Secondary | ICD-10-CM | POA: Diagnosis not present

## 2017-11-26 DIAGNOSIS — M859 Disorder of bone density and structure, unspecified: Secondary | ICD-10-CM | POA: Diagnosis not present

## 2017-11-26 DIAGNOSIS — I471 Supraventricular tachycardia: Secondary | ICD-10-CM | POA: Diagnosis not present

## 2017-11-26 DIAGNOSIS — Z1389 Encounter for screening for other disorder: Secondary | ICD-10-CM | POA: Diagnosis not present

## 2017-11-26 DIAGNOSIS — Z0000 Encounter for general adult medical examination without abnormal findings: Secondary | ICD-10-CM | POA: Diagnosis not present

## 2017-11-27 DIAGNOSIS — G4733 Obstructive sleep apnea (adult) (pediatric): Secondary | ICD-10-CM | POA: Diagnosis not present

## 2017-11-28 DIAGNOSIS — Z1212 Encounter for screening for malignant neoplasm of rectum: Secondary | ICD-10-CM | POA: Diagnosis not present

## 2017-12-11 ENCOUNTER — Ambulatory Visit
Admission: RE | Admit: 2017-12-11 | Discharge: 2017-12-11 | Disposition: A | Discharge status: Home / Self Care | Payer: BLUE CROSS/BLUE SHIELD | Source: Ambulatory Visit | Attending: Family Medicine | Admitting: Family Medicine

## 2017-12-11 ENCOUNTER — Other Ambulatory Visit: Payer: Self-pay | Admitting: Family Medicine

## 2017-12-11 DIAGNOSIS — K449 Diaphragmatic hernia without obstruction or gangrene: Secondary | ICD-10-CM | POA: Diagnosis not present

## 2017-12-11 DIAGNOSIS — R0602 Shortness of breath: Secondary | ICD-10-CM

## 2018-01-22 DIAGNOSIS — I471 Supraventricular tachycardia: Secondary | ICD-10-CM | POA: Diagnosis not present

## 2018-02-03 DIAGNOSIS — F39 Unspecified mood [affective] disorder: Secondary | ICD-10-CM | POA: Diagnosis not present

## 2018-02-25 DIAGNOSIS — F331 Major depressive disorder, recurrent, moderate: Secondary | ICD-10-CM | POA: Diagnosis not present

## 2018-05-25 ENCOUNTER — Other Ambulatory Visit: Payer: Self-pay | Admitting: Internal Medicine

## 2018-05-25 ENCOUNTER — Ambulatory Visit
Admission: RE | Admit: 2018-05-25 | Discharge: 2018-05-25 | Disposition: A | Discharge status: Home / Self Care | Payer: BLUE CROSS/BLUE SHIELD | Source: Ambulatory Visit | Attending: Internal Medicine | Admitting: Internal Medicine

## 2018-05-25 DIAGNOSIS — Z1231 Encounter for screening mammogram for malignant neoplasm of breast: Secondary | ICD-10-CM | POA: Diagnosis not present

## 2018-05-27 DIAGNOSIS — I10 Essential (primary) hypertension: Secondary | ICD-10-CM | POA: Diagnosis not present

## 2018-05-27 DIAGNOSIS — E668 Other obesity: Secondary | ICD-10-CM | POA: Diagnosis not present

## 2018-05-27 DIAGNOSIS — F900 Attention-deficit hyperactivity disorder, predominantly inattentive type: Secondary | ICD-10-CM | POA: Diagnosis not present

## 2018-05-27 DIAGNOSIS — E7849 Other hyperlipidemia: Secondary | ICD-10-CM | POA: Diagnosis not present

## 2018-05-27 DIAGNOSIS — F331 Major depressive disorder, recurrent, moderate: Secondary | ICD-10-CM | POA: Diagnosis not present

## 2018-05-27 DIAGNOSIS — M7989 Other specified soft tissue disorders: Secondary | ICD-10-CM | POA: Diagnosis not present

## 2018-08-24 DIAGNOSIS — R05 Cough: Secondary | ICD-10-CM | POA: Diagnosis not present

## 2018-08-24 DIAGNOSIS — J111 Influenza due to unidentified influenza virus with other respiratory manifestations: Secondary | ICD-10-CM | POA: Diagnosis not present

## 2018-08-24 DIAGNOSIS — I10 Essential (primary) hypertension: Secondary | ICD-10-CM | POA: Diagnosis not present

## 2018-08-24 DIAGNOSIS — G4733 Obstructive sleep apnea (adult) (pediatric): Secondary | ICD-10-CM | POA: Diagnosis not present

## 2018-08-24 DIAGNOSIS — J189 Pneumonia, unspecified organism: Secondary | ICD-10-CM | POA: Diagnosis not present

## 2018-08-24 DIAGNOSIS — J302 Other seasonal allergic rhinitis: Secondary | ICD-10-CM | POA: Diagnosis not present

## 2018-09-16 DIAGNOSIS — Z6830 Body mass index (BMI) 30.0-30.9, adult: Secondary | ICD-10-CM | POA: Diagnosis not present

## 2018-09-16 DIAGNOSIS — J189 Pneumonia, unspecified organism: Secondary | ICD-10-CM | POA: Diagnosis not present

## 2018-09-30 DIAGNOSIS — H43811 Vitreous degeneration, right eye: Secondary | ICD-10-CM | POA: Diagnosis not present

## 2018-09-30 DIAGNOSIS — H2513 Age-related nuclear cataract, bilateral: Secondary | ICD-10-CM | POA: Diagnosis not present

## 2018-09-30 DIAGNOSIS — H40013 Open angle with borderline findings, low risk, bilateral: Secondary | ICD-10-CM | POA: Diagnosis not present

## 2018-09-30 DIAGNOSIS — H25013 Cortical age-related cataract, bilateral: Secondary | ICD-10-CM | POA: Diagnosis not present

## 2018-11-23 DIAGNOSIS — I10 Essential (primary) hypertension: Secondary | ICD-10-CM | POA: Diagnosis not present

## 2018-11-23 DIAGNOSIS — E042 Nontoxic multinodular goiter: Secondary | ICD-10-CM | POA: Diagnosis not present

## 2018-11-23 DIAGNOSIS — M859 Disorder of bone density and structure, unspecified: Secondary | ICD-10-CM | POA: Diagnosis not present

## 2018-11-23 DIAGNOSIS — M109 Gout, unspecified: Secondary | ICD-10-CM | POA: Diagnosis not present

## 2018-11-24 DIAGNOSIS — I10 Essential (primary) hypertension: Secondary | ICD-10-CM | POA: Diagnosis not present

## 2018-11-24 DIAGNOSIS — R82998 Other abnormal findings in urine: Secondary | ICD-10-CM | POA: Diagnosis not present

## 2018-11-30 DIAGNOSIS — I10 Essential (primary) hypertension: Secondary | ICD-10-CM | POA: Diagnosis not present

## 2018-11-30 DIAGNOSIS — E785 Hyperlipidemia, unspecified: Secondary | ICD-10-CM | POA: Diagnosis not present

## 2018-11-30 DIAGNOSIS — Z0000 Encounter for general adult medical examination without abnormal findings: Secondary | ICD-10-CM | POA: Diagnosis not present

## 2018-11-30 DIAGNOSIS — Z1331 Encounter for screening for depression: Secondary | ICD-10-CM | POA: Diagnosis not present

## 2018-11-30 DIAGNOSIS — J189 Pneumonia, unspecified organism: Secondary | ICD-10-CM | POA: Diagnosis not present

## 2018-11-30 DIAGNOSIS — I471 Supraventricular tachycardia: Secondary | ICD-10-CM | POA: Diagnosis not present

## 2019-01-07 DIAGNOSIS — M5417 Radiculopathy, lumbosacral region: Secondary | ICD-10-CM | POA: Diagnosis not present

## 2019-01-07 DIAGNOSIS — M545 Low back pain: Secondary | ICD-10-CM | POA: Diagnosis not present

## 2019-01-27 DIAGNOSIS — M5416 Radiculopathy, lumbar region: Secondary | ICD-10-CM | POA: Diagnosis not present

## 2019-02-02 DIAGNOSIS — H40013 Open angle with borderline findings, low risk, bilateral: Secondary | ICD-10-CM | POA: Diagnosis not present

## 2019-02-02 DIAGNOSIS — H04123 Dry eye syndrome of bilateral lacrimal glands: Secondary | ICD-10-CM | POA: Diagnosis not present

## 2019-02-02 DIAGNOSIS — H0102B Squamous blepharitis left eye, upper and lower eyelids: Secondary | ICD-10-CM | POA: Diagnosis not present

## 2019-02-02 DIAGNOSIS — H0102A Squamous blepharitis right eye, upper and lower eyelids: Secondary | ICD-10-CM | POA: Diagnosis not present

## 2019-02-03 DIAGNOSIS — M5416 Radiculopathy, lumbar region: Secondary | ICD-10-CM | POA: Diagnosis not present

## 2019-02-10 DIAGNOSIS — M5416 Radiculopathy, lumbar region: Secondary | ICD-10-CM | POA: Diagnosis not present

## 2019-02-17 DIAGNOSIS — M5416 Radiculopathy, lumbar region: Secondary | ICD-10-CM | POA: Diagnosis not present

## 2019-02-18 ENCOUNTER — Other Ambulatory Visit: Payer: Self-pay | Admitting: Chiropractic Medicine

## 2019-02-18 ENCOUNTER — Other Ambulatory Visit: Payer: Self-pay

## 2019-02-18 DIAGNOSIS — M5417 Radiculopathy, lumbosacral region: Secondary | ICD-10-CM

## 2019-02-22 DIAGNOSIS — M545 Low back pain: Secondary | ICD-10-CM | POA: Diagnosis not present

## 2019-03-01 DIAGNOSIS — M5136 Other intervertebral disc degeneration, lumbar region: Secondary | ICD-10-CM | POA: Diagnosis not present

## 2019-03-04 DIAGNOSIS — M5430 Sciatica, unspecified side: Secondary | ICD-10-CM | POA: Diagnosis not present

## 2019-03-04 DIAGNOSIS — M5440 Lumbago with sciatica, unspecified side: Secondary | ICD-10-CM | POA: Diagnosis not present

## 2019-03-05 DIAGNOSIS — M5137 Other intervertebral disc degeneration, lumbosacral region: Secondary | ICD-10-CM | POA: Diagnosis not present

## 2019-03-12 DIAGNOSIS — M5430 Sciatica, unspecified side: Secondary | ICD-10-CM | POA: Diagnosis not present

## 2019-03-12 DIAGNOSIS — M5440 Lumbago with sciatica, unspecified side: Secondary | ICD-10-CM | POA: Diagnosis not present

## 2019-03-13 ENCOUNTER — Other Ambulatory Visit: Payer: BLUE CROSS/BLUE SHIELD

## 2019-03-16 DIAGNOSIS — M5430 Sciatica, unspecified side: Secondary | ICD-10-CM | POA: Diagnosis not present

## 2019-03-16 DIAGNOSIS — M5440 Lumbago with sciatica, unspecified side: Secondary | ICD-10-CM | POA: Diagnosis not present

## 2019-03-18 DIAGNOSIS — M5430 Sciatica, unspecified side: Secondary | ICD-10-CM | POA: Diagnosis not present

## 2019-03-18 DIAGNOSIS — M5440 Lumbago with sciatica, unspecified side: Secondary | ICD-10-CM | POA: Diagnosis not present

## 2019-03-19 DIAGNOSIS — M5416 Radiculopathy, lumbar region: Secondary | ICD-10-CM | POA: Diagnosis not present

## 2019-03-19 DIAGNOSIS — M5126 Other intervertebral disc displacement, lumbar region: Secondary | ICD-10-CM | POA: Diagnosis not present

## 2019-03-19 DIAGNOSIS — I10 Essential (primary) hypertension: Secondary | ICD-10-CM | POA: Diagnosis not present

## 2019-03-19 DIAGNOSIS — Z6832 Body mass index (BMI) 32.0-32.9, adult: Secondary | ICD-10-CM | POA: Diagnosis not present

## 2019-03-23 DIAGNOSIS — M5440 Lumbago with sciatica, unspecified side: Secondary | ICD-10-CM | POA: Diagnosis not present

## 2019-03-23 DIAGNOSIS — M5430 Sciatica, unspecified side: Secondary | ICD-10-CM | POA: Diagnosis not present

## 2019-03-25 DIAGNOSIS — M5430 Sciatica, unspecified side: Secondary | ICD-10-CM | POA: Diagnosis not present

## 2019-03-25 DIAGNOSIS — M5440 Lumbago with sciatica, unspecified side: Secondary | ICD-10-CM | POA: Diagnosis not present

## 2019-03-30 DIAGNOSIS — M545 Low back pain: Secondary | ICD-10-CM | POA: Diagnosis not present

## 2019-03-30 DIAGNOSIS — M5440 Lumbago with sciatica, unspecified side: Secondary | ICD-10-CM | POA: Diagnosis not present

## 2019-03-31 DIAGNOSIS — Z1159 Encounter for screening for other viral diseases: Secondary | ICD-10-CM | POA: Diagnosis not present

## 2019-04-01 DIAGNOSIS — M5440 Lumbago with sciatica, unspecified side: Secondary | ICD-10-CM | POA: Diagnosis not present

## 2019-04-01 DIAGNOSIS — M545 Low back pain: Secondary | ICD-10-CM | POA: Diagnosis not present

## 2019-04-06 DIAGNOSIS — M5126 Other intervertebral disc displacement, lumbar region: Secondary | ICD-10-CM | POA: Diagnosis not present

## 2019-04-06 DIAGNOSIS — M5127 Other intervertebral disc displacement, lumbosacral region: Secondary | ICD-10-CM | POA: Diagnosis not present

## 2019-04-06 HISTORY — PX: BACK SURGERY: SHX140

## 2019-05-06 DIAGNOSIS — Z23 Encounter for immunization: Secondary | ICD-10-CM | POA: Diagnosis not present

## 2019-05-06 DIAGNOSIS — M545 Low back pain: Secondary | ICD-10-CM | POA: Diagnosis not present

## 2019-05-06 DIAGNOSIS — M5126 Other intervertebral disc displacement, lumbar region: Secondary | ICD-10-CM | POA: Diagnosis not present

## 2019-05-14 DIAGNOSIS — M5126 Other intervertebral disc displacement, lumbar region: Secondary | ICD-10-CM | POA: Diagnosis not present

## 2019-05-14 DIAGNOSIS — M545 Low back pain: Secondary | ICD-10-CM | POA: Diagnosis not present

## 2019-05-20 DIAGNOSIS — M545 Low back pain: Secondary | ICD-10-CM | POA: Diagnosis not present

## 2019-05-20 DIAGNOSIS — M5126 Other intervertebral disc displacement, lumbar region: Secondary | ICD-10-CM | POA: Diagnosis not present

## 2019-05-27 DIAGNOSIS — M5126 Other intervertebral disc displacement, lumbar region: Secondary | ICD-10-CM | POA: Diagnosis not present

## 2019-05-27 DIAGNOSIS — M545 Low back pain: Secondary | ICD-10-CM | POA: Diagnosis not present

## 2019-06-08 DIAGNOSIS — Z6825 Body mass index (BMI) 25.0-25.9, adult: Secondary | ICD-10-CM | POA: Diagnosis not present

## 2019-06-08 DIAGNOSIS — M7061 Trochanteric bursitis, right hip: Secondary | ICD-10-CM | POA: Diagnosis not present

## 2019-06-08 DIAGNOSIS — I10 Essential (primary) hypertension: Secondary | ICD-10-CM | POA: Diagnosis not present

## 2019-06-09 DIAGNOSIS — G4733 Obstructive sleep apnea (adult) (pediatric): Secondary | ICD-10-CM | POA: Diagnosis not present

## 2019-06-09 DIAGNOSIS — I10 Essential (primary) hypertension: Secondary | ICD-10-CM | POA: Diagnosis not present

## 2019-06-09 DIAGNOSIS — E669 Obesity, unspecified: Secondary | ICD-10-CM | POA: Diagnosis not present

## 2019-06-09 DIAGNOSIS — E785 Hyperlipidemia, unspecified: Secondary | ICD-10-CM | POA: Diagnosis not present

## 2019-06-17 DIAGNOSIS — M5126 Other intervertebral disc displacement, lumbar region: Secondary | ICD-10-CM | POA: Diagnosis not present

## 2019-06-17 DIAGNOSIS — M25551 Pain in right hip: Secondary | ICD-10-CM | POA: Diagnosis not present

## 2019-06-23 ENCOUNTER — Ambulatory Visit: Payer: Self-pay | Admitting: Cardiology

## 2019-06-23 ENCOUNTER — Ambulatory Visit (INDEPENDENT_AMBULATORY_CARE_PROVIDER_SITE_OTHER): Payer: BC Managed Care – PPO | Admitting: Cardiology

## 2019-06-23 ENCOUNTER — Encounter: Payer: Self-pay | Admitting: Cardiology

## 2019-06-23 ENCOUNTER — Other Ambulatory Visit: Payer: Self-pay

## 2019-06-23 VITALS — BP 116/82 | HR 70 | Temp 97.8°F | Ht 65.0 in | Wt 198.0 lb

## 2019-06-23 DIAGNOSIS — M5126 Other intervertebral disc displacement, lumbar region: Secondary | ICD-10-CM | POA: Diagnosis not present

## 2019-06-23 DIAGNOSIS — I10 Essential (primary) hypertension: Secondary | ICD-10-CM

## 2019-06-23 DIAGNOSIS — I471 Supraventricular tachycardia: Secondary | ICD-10-CM

## 2019-06-23 DIAGNOSIS — Z9889 Other specified postprocedural states: Secondary | ICD-10-CM

## 2019-06-23 DIAGNOSIS — R0609 Other forms of dyspnea: Secondary | ICD-10-CM | POA: Diagnosis not present

## 2019-06-23 DIAGNOSIS — M25551 Pain in right hip: Secondary | ICD-10-CM | POA: Diagnosis not present

## 2019-06-23 DIAGNOSIS — R0600 Dyspnea, unspecified: Secondary | ICD-10-CM

## 2019-06-23 NOTE — Progress Notes (Signed)
Primary Physician:  Prince Solian, MD   Patient ID: [name], female    DOB: Jun 12, 1956, 63 y.o.   MRN: [id number]  Subjective:    Chief Complaint  Patient presents with   Shortness of Breath   Follow-up    HPI: [name]  is a 63 y.o. female  with hypertension, OSA, mild hyperlipidemia, ADHD and anxiety and depression and PSVT. She presented on 10/30/2015 with PSVT which spontaneously converted to sinus rhythm. Patient had ablation attempted by Dr. Cristopher Peru on 01/01/2016 that was unsuccessful. She later was evaluated by EP at [facility] and underwent typical AVNRT ablation by Dr. Glennon Mac on 09/29/2017 that was successful.   She was last seen by [name] in August of 2018. She made an appointment to see me today for shortness of breath. She has had shortness of breath for the last 1-2 years that she reports seems to be progressively worsening. She states that her daughter has noted this with just walking through the grocery store. No exertional chest pain.   She has chronic back pain from spinal stenosis. Had lumbarectomy and discectomy in Sept that she tolerated well. She is doing physical therapy, but is having now right sided back pain.   Blood pressure is well controlled. She admits to not using her CPAP regularly recently. No history of sudden cardiac death in the family, she is not a diabetic, no history of tobacco use, alcohol abuse or illicit drug use.  Past Medical History:  Diagnosis Date   Anxiety    Daytime somnolence 04/06/2013   DDD (degenerative disc disease), lumbar    Depression    Hypercholesteremia    Hypertension    OSA on CPAP 04/06/2013   Urinary incontinence     Past Surgical History:  Procedure Laterality Date   ABLATION  09/29/2017   BACK SURGERY  04/06/2019   ELECTROPHYSIOLOGIC STUDY N/A 01/01/2016   Procedure: SVT Ablation;  Surgeon: Evans Lance, MD;  Location: [place] [facility];  Service: Cardiovascular;  Laterality: N/A;    HAND SURGERY Right    KNEE ARTHROPLASTY Left    NASAL SEPTUM SURGERY     RADICAL HYSTERECTOMY  2000   TONSILLECTOMY AND ADENOIDECTOMY      Social History   Socioeconomic History   Marital status: Married    Spouse name: Not on file   Number of children: 2   Years of education: Not on file   Highest education level: Not on file  Occupational History   Not on file  Social Needs   Financial resource strain: Not on file   Food insecurity    Worry: Not on file    Inability: Not on file   Transportation needs    Medical: Not on file    Non-medical: Not on file  Tobacco Use   Smoking status: Never Smoker   Smokeless tobacco: Never Used  Substance and Sexual Activity   Alcohol use: Yes    Alcohol/week: 0.0 standard drinks   Drug use: No   Sexual activity: Yes    Partners: Male    Birth control/protection: Surgical    Comment: hysterectomy  Lifestyle   Physical activity    Days per week: Not on file    Minutes per session: Not on file   Stress: Not on file  Relationships   Social connections    Talks on phone: Not on file    Gets together: Not on file    Attends religious service: Not  on file    Active member of club or organization: Not on file    Attends meetings of clubs or organizations: Not on file    Relationship status: Not on file   Intimate partner violence    Fear of current or ex partner: Not on file    Emotionally abused: Not on file    Physically abused: Not on file    Forced sexual activity: Not on file  Other Topics Concern   Not on file  Social History Narrative   Not on file    Review of Systems  Constitution: Negative for decreased appetite, malaise/fatigue, weight gain and weight loss.  Eyes: Negative for visual disturbance.  Cardiovascular: Positive for dyspnea on exertion. Negative for chest pain, claudication, leg swelling, orthopnea, palpitations and syncope.  Respiratory: Negative for hemoptysis and wheezing.     Endocrine: Negative for cold intolerance and heat intolerance.  Hematologic/Lymphatic: Does not bruise/bleed easily.  Skin: Negative for nail changes.  Musculoskeletal: Positive for back pain and joint pain. Negative for muscle weakness and myalgias.  Gastrointestinal: Negative for abdominal pain, change in bowel habit, nausea and vomiting.  Neurological: Negative for difficulty with concentration, dizziness, focal weakness and headaches.  Psychiatric/Behavioral: Negative for altered mental status and suicidal ideas.  All other systems reviewed and are negative.     Objective:  Blood pressure 116/82, pulse 70, temperature 97.8 F (36.6 C), height 5' 5"  (1.651 m), weight 198 lb (89.8 kg), SpO2 98 %. Body mass index is 32.95 kg/m.    Physical Exam  Constitutional: She is oriented to person, place, and time. Vital signs are normal. She appears well-developed and well-nourished.  HENT:  Head: Normocephalic and atraumatic.  Neck: Normal range of motion.  Cardiovascular: Normal rate, regular rhythm, normal heart sounds and intact distal pulses.  Pulmonary/Chest: Effort normal and breath sounds normal. No accessory muscle usage. No respiratory distress.  Abdominal: Soft. Bowel sounds are normal.  Musculoskeletal: Normal range of motion.  Neurological: She is alert and oriented to person, place, and time.  Skin: Skin is warm and dry.  Vitals reviewed.  Radiology: No results found.  Laboratory examination:   05/28/2019: Glucose 106, Creatinine 0.95, eGFR 65/75, ALT 54, CMP otherwise normal. Cholesterol 235, triglycerides 137, HDL 55, LDL 155. TSH normal. CBC normal.   CMP Latest Ref Rng & Units 01/17/2016 12/27/2015 09/29/2015  Glucose 65 - 99 mg/dL 98 98 83  BUN 6 - 20 mg/dL 14 15 39(H)  Creatinine 0.44 - 1.00 mg/dL 0.80 0.84 1.66(H)  Sodium 135 - 145 mmol/L 144 142 143  Potassium 3.5 - 5.1 mmol/L 4.0 4.2 4.7  Chloride 101 - 111 mmol/L 108 106 104  CO2 20 - 31 mmol/L - 23 21(L)   Calcium 8.6 - 10.4 mg/dL - 9.5 10.5(H)   CBC Latest Ref Rng & Units 01/17/2016 12/27/2015 10/21/2015  WBC 3.8 - 10.8 K/uL - 6.9 7.0  Hemoglobin 12.0 - 15.0 g/dL 18.7(H) 14.6 13.6  Hematocrit 36.0 - 46.0 % 55.0(H) 45.4(H) 40.2  Platelets 140 - 400 K/uL - 241 -   Lipid Panel  No results found for: CHOL, TRIG, HDL, CHOLHDL, VLDL, LDLCALC, LDLDIRECT HEMOGLOBIN A1C No results found for: HGBA1C, MPG TSH No results for input(s): TSH in the last 8760 hours.  PRN Meds:. Medications Discontinued During This Encounter  Medication Reason   lisdexamfetamine (VYVANSE) 40 MG capsule Error   atenolol (TENORMIN) 100 MG tablet Error   Current Meds  Medication Sig   aspirin 81 MG tablet  Take 81 mg by mouth daily.   Calcium Carbonate-Vitamin D 600-400 MG-UNIT chew tablet Chew 1 tablet by mouth 2 (two) times daily.   fluticasone (FLONASE) 50 MCG/ACT nasal spray Place 1 spray into both nostrils daily as needed for allergies.    ibuprofen (ADVIL) 600 MG tablet Take 600 mg by mouth every 6 (six) hours as needed.   Olmesartan-Amlodipine-HCTZ 40-10-25 MG TABS Take 1 tablet by mouth every morning.   Vortioxetine HBr (TRINTELLIX) 20 MG TABS Take 20 mg by mouth daily.    Cardiac Studies:   Echocardiogram 08/29/2017: Left ventricle cavity is normal in size. Moderate concentric hypertrophy of the left ventricle. Normal global wall motion. Doppler evidence of grade II (pseudonormal) diastolic dysfunction, elevated LAP. LVEF 55-60%. Left atrial cavity is mildly dilated. Mild (Grade I) mitral regurgitation. Mild tricuspid regurgitation. RA-RV peak gradient 27 mmHg. No significant change compared to prior study dated 11/15/2015.  Nuclear stress test 11/13/2015:  1. The resting electrocardiogram demonstrated normal sinus rhythm, normal resting conduction, no resting arrhythmias and normal rest repolarization.  Stress EKG is non-diagnostic for ischemia as it a pharmacologic stress using Lexiscan. Stress  symptoms included dizziness. 2. Myocardial perfusion imaging is normal. Overall left ventricular systolic function was normal without regional wall motion abnormalities. The left ventricular ejection fraction was 79%.  Assessment:   Dyspnea on exertion - Plan: PCV ECHOCARDIOGRAM COMPLETE  PSVT (paroxysmal supraventricular tachycardia) (HCC) - Plan: EKG 12-Lead  S/P atrioventricular nodal ablation  Primary hypertension - Plan: EKG 12-Lead  EKG 06/23/2019: Normal sinus rhythm at 71 bpm, normal axis, nonspecific T wave abnormality.   Recommendations:   Patient made an appointment to see [name] due to progressively worsening shortness of breath. She has done well post ablation without recurrence of SVT. No exertional chest pain. No clinical evidence of heart failure. She has had echocardiogram in 2019 that showed grade 2 diastolic dysfunction, given her progressively worsening symptoms, I have recommended repeating echocardiogram. She has also had nuclear stress test in 2017 that was low risk. She has recently undergone spinal surgery without CV complications and has not had any exertional chest pain, will hold off on this for now. Depending upon echo results, may consider this. May also consider pulmonary evaluation if cardiac workup is unyielding.  I do suspect that her dyspnea is multifactoral. She has gained some weight over the last few months and has not previously been exercising due to spinal issues. I suspect her weight and deconditioning are also contributing and have encouraged her to work on these. Blood pressure is well controlled. She does have elevated lipids, would recommend changing her diet and regular exercise and if her LDL does not reduce, she should start statin therapy. I will plan on seeing her back after her echo for follow up.    Miquel Dunn, MSN, APRN, FNP-C [facility]. [place] Office: [phone] Fax: [phone]

## 2019-06-28 NOTE — Telephone Encounter (Signed)
From pt

## 2019-06-30 ENCOUNTER — Other Ambulatory Visit: Payer: Self-pay

## 2019-06-30 DIAGNOSIS — Z20822 Contact with and (suspected) exposure to covid-19: Secondary | ICD-10-CM

## 2019-07-03 LAB — NOVEL CORONAVIRUS, NAA: SARS-CoV-2, NAA: DETECTED — AB

## 2019-07-05 ENCOUNTER — Other Ambulatory Visit: Payer: Self-pay

## 2019-07-08 DIAGNOSIS — M7061 Trochanteric bursitis, right hip: Secondary | ICD-10-CM | POA: Diagnosis not present

## 2019-07-14 DIAGNOSIS — M25551 Pain in right hip: Secondary | ICD-10-CM | POA: Diagnosis not present

## 2019-07-14 DIAGNOSIS — M5126 Other intervertebral disc displacement, lumbar region: Secondary | ICD-10-CM | POA: Diagnosis not present

## 2019-07-19 ENCOUNTER — Other Ambulatory Visit: Payer: Self-pay | Admitting: Internal Medicine

## 2019-07-19 DIAGNOSIS — R928 Other abnormal and inconclusive findings on diagnostic imaging of breast: Secondary | ICD-10-CM

## 2019-07-26 ENCOUNTER — Other Ambulatory Visit: Payer: Self-pay

## 2019-07-26 ENCOUNTER — Ambulatory Visit
Admission: RE | Admit: 2019-07-26 | Discharge: 2019-07-26 | Disposition: A | Discharge status: Home / Self Care | Payer: BC Managed Care – PPO | Source: Ambulatory Visit | Attending: Internal Medicine | Admitting: Internal Medicine

## 2019-07-26 DIAGNOSIS — R928 Other abnormal and inconclusive findings on diagnostic imaging of breast: Secondary | ICD-10-CM

## 2019-07-26 DIAGNOSIS — Z1231 Encounter for screening mammogram for malignant neoplasm of breast: Secondary | ICD-10-CM | POA: Diagnosis not present

## 2019-07-28 ENCOUNTER — Ambulatory Visit: Payer: Self-pay | Admitting: Cardiology

## 2019-07-28 DIAGNOSIS — M25551 Pain in right hip: Secondary | ICD-10-CM | POA: Diagnosis not present

## 2019-07-28 DIAGNOSIS — M5126 Other intervertebral disc displacement, lumbar region: Secondary | ICD-10-CM | POA: Diagnosis not present

## 2019-07-29 DIAGNOSIS — R300 Dysuria: Secondary | ICD-10-CM | POA: Diagnosis not present

## 2019-08-02 ENCOUNTER — Other Ambulatory Visit: Payer: Self-pay

## 2019-08-02 ENCOUNTER — Ambulatory Visit (INDEPENDENT_AMBULATORY_CARE_PROVIDER_SITE_OTHER): Payer: BC Managed Care – PPO

## 2019-08-02 DIAGNOSIS — R0609 Other forms of dyspnea: Secondary | ICD-10-CM

## 2019-08-02 DIAGNOSIS — R0600 Dyspnea, unspecified: Secondary | ICD-10-CM

## 2019-08-06 ENCOUNTER — Encounter: Payer: Self-pay | Admitting: Cardiology

## 2019-08-06 ENCOUNTER — Telehealth: Payer: BC Managed Care – PPO | Admitting: Cardiology

## 2019-08-06 VITALS — Ht 65.0 in | Wt 196.0 lb

## 2019-08-06 DIAGNOSIS — I5032 Chronic diastolic (congestive) heart failure: Secondary | ICD-10-CM | POA: Diagnosis not present

## 2019-08-06 DIAGNOSIS — E782 Mixed hyperlipidemia: Secondary | ICD-10-CM | POA: Diagnosis not present

## 2019-08-06 DIAGNOSIS — R0609 Other forms of dyspnea: Secondary | ICD-10-CM | POA: Diagnosis not present

## 2019-08-06 DIAGNOSIS — I10 Essential (primary) hypertension: Secondary | ICD-10-CM | POA: Diagnosis not present

## 2019-08-06 DIAGNOSIS — R0600 Dyspnea, unspecified: Secondary | ICD-10-CM

## 2019-08-06 NOTE — Progress Notes (Signed)
Primary Physician:  Prince Solian, MD   Patient ID: [name], female    DOB: 1956/04/24, 64 y.o.   MRN: [id number]  Subjective:    Chief Complaint  Patient presents with  . Shortness of Breath    pt c/o sob   This visit type was conducted due to national recommendations for restrictions regarding the COVID-19 Pandemic (e.g. social distancing).  This format is felt to be most appropriate for this patient at this time.  All issues noted in this document were discussed and addressed.  No physical exam was performed (except for noted visual exam findings with Telehealth visits).  The patient has consented to conduct a Telehealth visit and understands insurance will be billed.   I discussed the limitations of evaluation and management by telemedicine and the availability of in person appointments. The patient expressed understanding and agreed to proceed.  Virtual Visit via Video Note is as below  I connected with@, on 08/06/19 at 1000 by a video enabled telemedicine application and verified that I am speaking with the correct person using two identifiers.     I have discussed with her regarding the safety during COVID Pandemic and steps and precautions including social distancing with the patient.    HPI: [name]  is a 64 y.o. female  with hypertension, OSA, mild hyperlipidemia, ADHD and anxiety and depression and PSVT. She presented on 10/30/2015 with PSVT which spontaneously converted to sinus rhythm. Patient had ablation attempted by Dr. Cristopher Peru on 01/01/2016 that was unsuccessful. 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   Patient was recently seen for complaints of continued shortness of breath. She had originally been evaluated by [name] for dyspnea a few years ago, that she states never resolved and has progressively worsened. No exertional chest pain. Despite regular exercise  earlier this year, she continued to have shortness of breath with activities. She underwent echocardiogram and now presents for follow up.   Since last seen by me, she did have COVID. She has recuperated well, dyspnea is back to baseline. She has chronic back pain from spinal stenosis. Had lumbarectomy and discectomy in Sept that she tolerated well.   Blood pressure is well controlled. She does not use CPAP regularly. No history of sudden cardiac death in the family, she is not a diabetic, no history of tobacco use, alcohol abuse or illicit drug use.  Past Medical History:  Diagnosis Date  . Anxiety   . Daytime somnolence 04/06/2013  . DDD (degenerative disc disease), lumbar   . Depression   . Hypercholesteremia   . Hypertension   . OSA on CPAP 04/06/2013  . Urinary incontinence     Past Surgical History:  Procedure Laterality Date  . ABLATION  09/29/2017  . BACK SURGERY  04/06/2019  . BREAST EXCISIONAL BIOPSY Left   . ELECTROPHYSIOLOGIC STUDY N/A 01/01/2016   Procedure: SVT Ablation;  Surgeon: Evans Lance, MD;  Location: [place] [facility];  Service: Cardiovascular;  Laterality: N/A;  . HAND SURGERY Right   . KNEE ARTHROPLASTY Left   . NASAL SEPTUM SURGERY    . RADICAL HYSTERECTOMY  2000  . TONSILLECTOMY AND ADENOIDECTOMY      Social History   Socioeconomic History  . Marital status: Married    Spouse name: Not on file  . Number of children: 2  . Years of education: Not on file  . Highest  education level: Not on file  Occupational History  . Not on file  Tobacco Use  . Smoking status: Never Smoker  . Smokeless tobacco: Never Used  Substance and Sexual Activity  . Alcohol use: Yes    Alcohol/week: 0.0 standard drinks  . Drug use: No  . Sexual activity: Yes    Partners: Male    Birth control/protection: Surgical    Comment: hysterectomy  Other Topics Concern  . Not on file  Social History Narrative  . Not on file   Social Determinants of Health   Financial  Resource Strain:   . Difficulty of Paying Living Expenses: Not on file  Food Insecurity:   . Worried About Charity fundraiser in the Last Year: Not on file  . Ran Out of Food in the Last Year: Not on file  Transportation Needs:   . Lack of Transportation (Medical): Not on file  . Lack of Transportation (Non-Medical): Not on file  Physical Activity:   . Days of Exercise per Week: Not on file  . Minutes of Exercise per Session: Not on file  Stress:   . Feeling of Stress : Not on file  Social Connections:   . Frequency of Communication with Friends and Family: Not on file  . Frequency of Social Gatherings with Friends and Family: Not on file  . Attends Religious Services: Not on file  . Active Member of Clubs or Organizations: Not on file  . Attends Archivist Meetings: Not on file  . Marital Status: Not on file  Intimate Partner Violence:   . Fear of Current or Ex-Partner: Not on file  . Emotionally Abused: Not on file  . Physically Abused: Not on file  . Sexually Abused: Not on file    Review of Systems  Constitution: Negative for decreased appetite, malaise/fatigue, weight gain and weight loss.  Eyes: Negative for visual disturbance.  Cardiovascular: Positive for dyspnea on exertion. Negative for chest pain, claudication, leg swelling, orthopnea, palpitations and syncope.  Respiratory: Negative for hemoptysis and wheezing.   Endocrine: Negative for cold intolerance and heat intolerance.  Hematologic/Lymphatic: Does not bruise/bleed easily.  Skin: Negative for nail changes.  Musculoskeletal: Positive for back pain and joint pain. Negative for muscle weakness and myalgias.  Gastrointestinal: Negative for abdominal pain, change in bowel habit, nausea and vomiting.  Neurological: Negative for difficulty with concentration, dizziness, focal weakness and headaches.  Psychiatric/Behavioral: Negative for altered mental status and suicidal ideas.  All other systems reviewed  and are negative.     Objective:  Height 5' 5" (1.651 m), weight 196 lb (88.9 kg). Body mass index is 32.62 kg/m.    Physical Exam  Constitutional: She is oriented to person, place, and time. Vital signs are normal. She appears well-developed and well-nourished.  HENT:  Head: Normocephalic and atraumatic.  Cardiovascular: Normal rate, regular rhythm, normal heart sounds and intact distal pulses.  Pulmonary/Chest: Effort normal and breath sounds normal. No accessory muscle usage. No respiratory distress.  Abdominal: Soft. Bowel sounds are normal.  Musculoskeletal:        General: Normal range of motion.     Cervical back: Normal range of motion.  Neurological: She is alert and oriented to person, place, and time.  Skin: Skin is warm and dry.  Vitals reviewed.  Radiology: No results found.  Laboratory examination:   05/28/2019: Glucose 106, Creatinine 0.95, eGFR 65/75, ALT 54, CMP otherwise normal. Cholesterol 235, triglycerides 137, HDL 55, LDL 155. TSH normal. CBC  normal.   CMP Latest Ref Rng & Units 01/17/2016 12/27/2015 09/29/2015  Glucose 65 - 99 mg/dL 98 98 83  BUN 6 - 20 mg/dL 14 15 39(H)  Creatinine 0.44 - 1.00 mg/dL 0.80 0.84 1.66(H)  Sodium 135 - 145 mmol/L 144 142 143  Potassium 3.5 - 5.1 mmol/L 4.0 4.2 4.7  Chloride 101 - 111 mmol/L 108 106 104  CO2 20 - 31 mmol/L - 23 21(L)  Calcium 8.6 - 10.4 mg/dL - 9.5 10.5(H)   CBC Latest Ref Rng & Units 01/17/2016 12/27/2015 10/21/2015  WBC 3.8 - 10.8 K/uL - 6.9 7.0  Hemoglobin 12.0 - 15.0 g/dL 18.7(H) 14.6 13.6  Hematocrit 36.0 - 46.0 % 55.0(H) 45.4(H) 40.2  Platelets 140 - 400 K/uL - 241 -   Lipid Panel  No results found for: CHOL, TRIG, HDL, CHOLHDL, VLDL, LDLCALC, LDLDIRECT HEMOGLOBIN A1C No results found for: HGBA1C, MPG TSH No results for input(s): TSH in the last 8760 hours.  PRN Meds:. There are no discontinued medications. Current Meds  Medication Sig  . aspirin 81 MG tablet Take 81 mg by mouth daily.  .  Calcium Carbonate-Vitamin D 600-400 MG-UNIT chew tablet Chew 1 tablet by mouth 2 (two) times daily.  . fluticasone (FLONASE) 50 MCG/ACT nasal spray Place 1 spray into both nostrils daily as needed for allergies.   Marland Kitchen ibuprofen (ADVIL) 600 MG tablet Take 600 mg by mouth every 6 (six) hours as needed.  Marland Kitchen ketoconazole (NIZORAL) 2 % cream Apply 1 application topically daily as needed.  . Olmesartan-Amlodipine-HCTZ 40-10-25 MG TABS Take 1 tablet by mouth every morning.  . Vortioxetine HBr (TRINTELLIX) 20 MG TABS Take 20 mg by mouth daily.    Cardiac Studies:   Echocardiogram 08/02/2019: Mild concentric hypertrophy of the left ventricle. Left ventricle cavity is normal in size. Normal global wall motion. Normal LV systolic function with EF 62%. Doppler evidence of grade II (pseudonormal) diastolic dysfunction, indeterminate LAP. Calculated EF 62%. Probably trileaflet aortic valve with mild calcification. Trace aortic stenosis. Trace aortic regurgitation. Trace mitral regurgitation, trace tricuspid regurgitation. Inadequate TR jet to estimate pulmonary artery systolic pressure. Estimated RA pressure 8 mmHg. No significant change compared to previous study on 08/29/2017.  Nuclear stress test 11/13/2015:  1. The resting electrocardiogram demonstrated normal sinus rhythm, normal resting conduction, no resting arrhythmias and normal rest repolarization.  Stress EKG is non-diagnostic for ischemia as it a pharmacologic stress using Lexiscan. Stress symptoms included dizziness. 2. Myocardial perfusion imaging is normal. Overall left ventricular systolic function was normal without regional wall motion abnormalities. The left ventricular ejection fraction was 79%.  Assessment:   Dyspnea on exertion  Primary hypertension  Mixed hyperlipidemia  Chronic diastolic (congestive) heart failure ([place])  EKG 06/23/2019: Normal sinus rhythm at 71 bpm, normal axis, nonspecific T wave abnormality.   Recommendations:    I have discussed recent echocardiogram results with the patient, she continues to have grade 2 diastolic dysfunction. No significant changes since her last echo in 2019. Although her dyspnea is likely contributed by her diastolic dysfunction, her symptoms seem out of proportion for this. Although she does not have exertional chest pain, she does have risk factors for CAD. Will obtain lexiscan nuclear stress test for further evaluation. She is unable to walk on the treadmill due to spinal issues. If stress testing is unyielding, I would  like to get an opinion from pulmonary to see if her dyspnea is potentially of pulmonary etiology. I will place referral to not delay evaluation.  She did not have a way to check her blood pressure today, but is generally well controlled. Will reevaluate at her next appointment. We have discussed her lipids that were performed in October. I do feel that she would benefit from statin therapy given that she has made diet changes and despite this her lipids are still elevated. Will start Crestor 10 mg daily. She will need repeat lipids in 8 weeks for follow up. I will plan to see her back in 4 weeks for follow up after her stress test to discuss the results.    Miquel Dunn, MSN, APRN, FNP-C [facility]. [place] Office: [phone] Fax: [phone]

## 2019-08-09 MED ORDER — ROSUVASTATIN CALCIUM 10 MG PO TABS: 10.0000 mg | ORAL_TABLET | Freq: Every day | ORAL | 2 refills | Status: DC

## 2019-08-16 ENCOUNTER — Ambulatory Visit (INDEPENDENT_AMBULATORY_CARE_PROVIDER_SITE_OTHER): Payer: BC Managed Care – PPO

## 2019-08-16 ENCOUNTER — Other Ambulatory Visit: Payer: Self-pay

## 2019-08-16 DIAGNOSIS — R0609 Other forms of dyspnea: Secondary | ICD-10-CM | POA: Diagnosis not present

## 2019-08-16 DIAGNOSIS — R0600 Dyspnea, unspecified: Secondary | ICD-10-CM

## 2019-08-25 ENCOUNTER — Ambulatory Visit (INDEPENDENT_AMBULATORY_CARE_PROVIDER_SITE_OTHER): Payer: BC Managed Care – PPO | Admitting: Cardiology

## 2019-08-25 ENCOUNTER — Other Ambulatory Visit: Payer: Self-pay

## 2019-08-25 ENCOUNTER — Encounter: Payer: Self-pay | Admitting: Cardiology

## 2019-08-25 VITALS — BP 116/75 | HR 70 | Temp 97.5°F | Ht 65.0 in | Wt 200.8 lb

## 2019-08-25 DIAGNOSIS — R0609 Other forms of dyspnea: Secondary | ICD-10-CM

## 2019-08-25 DIAGNOSIS — R0600 Dyspnea, unspecified: Secondary | ICD-10-CM

## 2019-08-25 DIAGNOSIS — I10 Essential (primary) hypertension: Secondary | ICD-10-CM | POA: Diagnosis not present

## 2019-08-25 DIAGNOSIS — E782 Mixed hyperlipidemia: Secondary | ICD-10-CM | POA: Diagnosis not present

## 2019-08-25 DIAGNOSIS — I5032 Chronic diastolic (congestive) heart failure: Secondary | ICD-10-CM

## 2019-08-25 NOTE — Progress Notes (Signed)
Primary Physician:  Prince Solian, MD   Patient ID: [name], female    DOB: [birth year], 64 y.o.   MRN: [id number]  Subjective:    Chief Complaint  Patient presents with  . Shortness of Breath  . Results    nuc  . Follow-up    HPI: [name]  is a 64 y.o. female  with hypertension, OSA, mild hyperlipidemia, ADHD and anxiety and depression and PSVT. She presented on 10/30/2015 with PSVT which spontaneously converted to sinus rhythm. Patient had ablation attempted by Dr. Cristopher Peru on 01/01/2016 that was unsuccessful. 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   Patient had recently followed up with [name] as she has continued to have dyspnea on exertion.  She has had dyspnea on exertion for several years and despite starting regular exercise earlier this year, her dyspnea has persisted and perhaps slightly worsened.  She underwent echocardiogram on 08/02/2019 that was unchanged from previous echocardiogram, has normal LVEF and grade 2 diastolic dysfunction.  She also underwent Lexiscan nuclear stress testing on 08/16/2019 and now presents to discuss results.  After her last office visit, I had referred her to pulmonary for evaluation given her persistent symptoms.  Although her dyspnea could be related to diastolic dysfunction, question pulmonary etiology as her dyspnea sounds out of proportion for diastolic dysfunction.    She was also started on Crestor after her last office visit due to mild hyperlipidemia.  She originally had some stomach pains with the medication, but for the last few days has not had these symptoms.  Blood pressure is well controlled. She does not use CPAP regularly. No history of sudden cardiac death in the family, she is not a diabetic, no history of tobacco use, alcohol abuse or illicit drug use.  Past Medical History:  Diagnosis Date  . Anxiety   . Daytime somnolence  04/06/2013  . DDD (degenerative disc disease), lumbar   . Depression   . Hypercholesteremia   . Hypertension   . OSA on CPAP 04/06/2013  . Urinary incontinence     Past Surgical History:  Procedure Laterality Date  . ABLATION  09/29/2017  . BACK SURGERY  04/06/2019  . BREAST EXCISIONAL BIOPSY Left   . ELECTROPHYSIOLOGIC STUDY N/A 01/01/2016   Procedure: SVT Ablation;  Surgeon: Evans Lance, MD;  Location: [place] [facility];  Service: Cardiovascular;  Laterality: N/A;  . HAND SURGERY Right   . KNEE ARTHROPLASTY Left   . NASAL SEPTUM SURGERY    . RADICAL HYSTERECTOMY  2000  . TONSILLECTOMY AND ADENOIDECTOMY      Social History   Socioeconomic History  . Marital status: Married    Spouse name: Not on file  . Number of children: 2  . Years of education: Not on file  . Highest education level: Not on file  Occupational History  . Not on file  Tobacco Use  . Smoking status: Never Smoker  . Smokeless tobacco: Never Used  Substance and Sexual Activity  . Alcohol use: Not Currently    Alcohol/week: 0.0 standard drinks  . Drug use: No  . Sexual activity: Yes    Partners: Male    Birth control/protection: Surgical    Comment: hysterectomy  Other Topics Concern  . Not on file  Social History Narrative  . Not on file   Social Determinants of Health   Financial Resource Strain:   .  Difficulty of Paying Living Expenses: Not on file  Food Insecurity:   . Worried About Charity fundraiser in the Last Year: Not on file  . Ran Out of Food in the Last Year: Not on file  Transportation Needs:   . Lack of Transportation (Medical): Not on file  . Lack of Transportation (Non-Medical): Not on file  Physical Activity:   . Days of Exercise per Week: Not on file  . Minutes of Exercise per Session: Not on file  Stress:   . Feeling of Stress : Not on file  Social Connections:   . Frequency of Communication with Friends and Family: Not on file  . Frequency of Social Gatherings with  Friends and Family: Not on file  . Attends Religious Services: Not on file  . Active Member of Clubs or Organizations: Not on file  . Attends Archivist Meetings: Not on file  . Marital Status: Not on file  Intimate Partner Violence:   . Fear of Current or Ex-Partner: Not on file  . Emotionally Abused: Not on file  . Physically Abused: Not on file  . Sexually Abused: Not on file    Review of Systems  Constitution: Negative for decreased appetite, malaise/fatigue, weight gain and weight loss.  Eyes: Negative for visual disturbance.  Cardiovascular: Positive for dyspnea on exertion. Negative for chest pain, claudication, leg swelling, orthopnea, palpitations and syncope.  Respiratory: Negative for hemoptysis and wheezing.   Endocrine: Negative for cold intolerance and heat intolerance.  Hematologic/Lymphatic: Does not bruise/bleed easily.  Skin: Negative for nail changes.  Musculoskeletal: Positive for back pain and joint pain. Negative for muscle weakness and myalgias.  Gastrointestinal: Negative for abdominal pain, change in bowel habit, nausea and vomiting.  Neurological: Negative for difficulty with concentration, dizziness, focal weakness and headaches.  Psychiatric/Behavioral: Negative for altered mental status and suicidal ideas.  All other systems reviewed and are negative.     Objective:  Blood pressure 116/75, pulse 70, temperature (!) 97.5 F (36.4 C), height 5' 5"  (1.651 m), weight 200 lb 12.8 oz (91.1 kg), SpO2 96 %. Body mass index is 33.41 kg/m.    Physical Exam  Constitutional: She is oriented to person, place, and time. Vital signs are normal. She appears well-developed and well-nourished.  HENT:  Head: Normocephalic and atraumatic.  Cardiovascular: Normal rate, regular rhythm, normal heart sounds and intact distal pulses.  Pulmonary/Chest: Effort normal and breath sounds normal. No accessory muscle usage. No respiratory distress.  Abdominal: Soft. Bowel  sounds are normal.  Musculoskeletal:        General: Normal range of motion.     Cervical back: Normal range of motion.  Neurological: She is alert and oriented to person, place, and time.  Skin: Skin is warm and dry.  Vitals reviewed.  Radiology: No results found.  Laboratory examination:   05/28/2019: Glucose 106, Creatinine 0.95, eGFR 65/75, ALT 54, CMP otherwise normal. Cholesterol 235, triglycerides 137, HDL 55, LDL 155. TSH normal. CBC normal.   CMP Latest Ref Rng & Units 01/17/2016 12/27/2015 09/29/2015  Glucose 65 - 99 mg/dL 98 98 83  BUN 6 - 20 mg/dL 14 15 39(H)  Creatinine 0.44 - 1.00 mg/dL 0.80 0.84 1.66(H)  Sodium 135 - 145 mmol/L 144 142 143  Potassium 3.5 - 5.1 mmol/L 4.0 4.2 4.7  Chloride 101 - 111 mmol/L 108 106 104  CO2 20 - 31 mmol/L - 23 21(L)  Calcium 8.6 - 10.4 mg/dL - 9.5 10.5(H)   CBC  Latest Ref Rng & Units 01/17/2016 12/27/2015 10/21/2015  WBC 3.8 - 10.8 K/uL - 6.9 7.0  Hemoglobin 12.0 - 15.0 g/dL 18.7(H) 14.6 13.6  Hematocrit 36.0 - 46.0 % 55.0(H) 45.4(H) 40.2  Platelets 140 - 400 K/uL - 241 -   Lipid Panel  No results found for: CHOL, TRIG, HDL, CHOLHDL, VLDL, LDLCALC, LDLDIRECT HEMOGLOBIN A1C No results found for: HGBA1C, MPG TSH No results for input(s): TSH in the last 8760 hours.  PRN Meds:. Medications Discontinued During This Encounter  Medication Reason  . ketoconazole (NIZORAL) 2 % cream Completed Course   Current Meds  Medication Sig  . aspirin 81 MG tablet Take 81 mg by mouth daily.  . Calcium Carbonate-Vitamin D 600-400 MG-UNIT chew tablet Chew 1 tablet by mouth daily.   . fluticasone (FLONASE) 50 MCG/ACT nasal spray Place 1 spray into both nostrils daily as needed for allergies.   Marland Kitchen ibuprofen (ADVIL) 600 MG tablet Take 600 mg by mouth every 6 (six) hours as needed.  . Olmesartan-Amlodipine-HCTZ 40-10-25 MG TABS Take 1 tablet by mouth every morning.  . rosuvastatin (CRESTOR) 10 MG tablet Take 1 tablet (10 mg total) by mouth daily.  .  Vortioxetine HBr (TRINTELLIX) 20 MG TABS Take 20 mg by mouth daily.    Cardiac Studies:   Lexiscan Sestamibi stress test 08/16/2019: No previous exam available for comparison. Lexiscan/modified Bruce nuclear stress test performed using 1-day protocol. Myocardial perfusion imaging is normal. Stress LVEF 72%.  Echocardiogram 08/02/2019: Mild concentric hypertrophy of the left ventricle. Left ventricle cavity is normal in size. Normal global wall motion. Normal LV systolic function with EF 62%. Doppler evidence of grade II (pseudonormal) diastolic dysfunction, indeterminate LAP. Calculated EF 62%. Probably trileaflet aortic valve with mild calcification. Trace aortic stenosis. Trace aortic regurgitation. Trace mitral regurgitation, trace tricuspid regurgitation. Inadequate TR jet to estimate pulmonary artery systolic pressure. Estimated RA pressure 8 mmHg. No significant change compared to previous study on 08/29/2017.   Assessment:   Dyspnea on exertion  Chronic diastolic (congestive) heart failure (HCC)  Primary hypertension  Mixed hyperlipidemia  EKG 06/23/2019: Normal sinus rhythm at 71 bpm, normal axis, nonspecific T wave abnormality.   Recommendations:   I reviewed and discussed her recent stress test results, no perfusion abnormalities are noted.  As she is undergone echocardiogram and nuclear stress testing, etiology for her dyspnea remains unclear.  Although could be contributed by her diastolic dysfunction and some of her weight, would like to get an opinion from pulmonary exclude possible etiology for this.  Also question this as she has previously tried to exercise regularly and did not have any significant improvement in her symptoms with this.  No clinical evidence of heart failure.  Blood pressure is well controlled.  She originally had some stomach pains with starting Crestor, but for the last few days has not had these symptoms.  She will continue to monitor and notify me  if she has any recurrence.  She will need repeat lipids along with CMP in approximately 6 weeks for follow-up on hyperlipidemia.  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  I will plan to see her back in 6 months, but encouraged her to contact me sooner if needed.  Miquel Dunn, MSN, APRN, FNP-C [facility]. [place] Office: [phone] Fax: [phone]

## 2019-08-31 MED ORDER — ATORVASTATIN CALCIUM 10 MG PO TABS: 10.0000 mg | ORAL_TABLET | Freq: Every day | ORAL | 3 refills | Status: DC

## 2019-08-31 NOTE — Telephone Encounter (Signed)
Per pt please read thank you

## 2019-09-03 ENCOUNTER — Ambulatory Visit: Payer: BC Managed Care – PPO | Admitting: Pulmonary Disease

## 2019-09-03 ENCOUNTER — Ambulatory Visit (INDEPENDENT_AMBULATORY_CARE_PROVIDER_SITE_OTHER): Payer: BC Managed Care – PPO

## 2019-09-03 ENCOUNTER — Encounter: Payer: Self-pay | Admitting: Pulmonary Disease

## 2019-09-03 ENCOUNTER — Other Ambulatory Visit: Payer: Self-pay

## 2019-09-03 VITALS — BP 118/78 | HR 57 | Temp 97.2°F | Ht 65.0 in | Wt 200.0 lb

## 2019-09-03 DIAGNOSIS — Z9989 Dependence on other enabling machines and devices: Secondary | ICD-10-CM

## 2019-09-03 DIAGNOSIS — I5032 Chronic diastolic (congestive) heart failure: Secondary | ICD-10-CM | POA: Diagnosis not present

## 2019-09-03 DIAGNOSIS — R0600 Dyspnea, unspecified: Secondary | ICD-10-CM | POA: Diagnosis not present

## 2019-09-03 DIAGNOSIS — Z8616 Personal history of COVID-19: Secondary | ICD-10-CM

## 2019-09-03 DIAGNOSIS — R0609 Other forms of dyspnea: Secondary | ICD-10-CM

## 2019-09-03 DIAGNOSIS — G4733 Obstructive sleep apnea (adult) (pediatric): Secondary | ICD-10-CM

## 2019-09-03 DIAGNOSIS — Z6833 Body mass index (BMI) 33.0-33.9, adult: Secondary | ICD-10-CM

## 2019-09-03 MED ORDER — ALBUTEROL SULFATE HFA 108 (90 BASE) MCG/ACT IN AERS: 1.0000 | INHALATION_SPRAY | Freq: Four times a day (QID) | RESPIRATORY_TRACT | 1 refills | Status: DC | PRN

## 2019-09-03 NOTE — Progress Notes (Signed)
Synopsis: Referred in February 2021 for dyspnea on exertion by Miquel Dunn, *  Subjective:   PATIENT ID: [name] GENDER: female DOB: 05/02/1956, MRN: [id number]  Chief Complaint  Patient presents with  . Consult    Dyspena with exertion. + covid 06/30/19    This is a 64 year old female, past medical history of OSA on CPAP, hypertension, hyper bulimia.  Referred for evaluation of dyspnea.  Patient saw Binnie Kand, NP cardiology office 08/25/2019.  Office visit reviewed.  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  Had ablation attempted by Dr. Lovena Le 2017 which was unsuccessful later seen by Dr. Glennon Mac at [facility] in 2019.  Had repeat ablation.  Echo with normal LVEF and grade 2 diastolic dysfunction.  She had a recent stress test which revealed no perfusion abnormalities etiology of her dyspnea have remained unclear.  Not felt to be related to heart failure.  Decision was made to refer to pulmonary medicine for evaluation.  OV 09/03/2019: she states that when she had covid19 she lost her taste and smell. Her SOB however has been going on for some time. She limited her exercise tolerance due to SVT history but restarted her exercise routine after her ablation. She currently works for [employer]. She has back problems and had back surgery. She has had some limitation due to this. Lifelong non-smoker.  Patient denies hemoptysis chest tightness.  Unsure about wheezing.  Does have significant dyspnea with climbing stairs or minimal exertion for activities of daily living.   Past Medical History:  Diagnosis Date  . Anxiety   . Daytime somnolence 04/06/2013  . DDD (degenerative disc disease), lumbar   . Depression   . Hypercholesteremia   . Hypertension   . OSA on CPAP 04/06/2013  . Urinary incontinence      Family History  Problem Relation Age of Onset  . Heart disease Father   . Hypertension Father   . Stroke Father   . Diabetes Maternal Grandmother      Past Surgical  History:  Procedure Laterality Date  . ABLATION  09/29/2017  . BACK SURGERY  04/06/2019  . BREAST EXCISIONAL BIOPSY Left   . ELECTROPHYSIOLOGIC STUDY N/A 01/01/2016   Procedure: SVT Ablation;  Surgeon: Evans Lance, MD;  Location: [place] [facility];  Service: Cardiovascular;  Laterality: N/A;  . HAND SURGERY Right   . KNEE ARTHROPLASTY Left   . NASAL SEPTUM SURGERY    . RADICAL HYSTERECTOMY  2000  . TONSILLECTOMY AND ADENOIDECTOMY      Social History   Socioeconomic History  . Marital status: Married    Spouse name: Not on file  . Number of children: 2  . Years of education: Not on file  . Highest education level: Not on file  Occupational History  . Not on file  Tobacco Use  . Smoking status: Never Smoker  . Smokeless tobacco: Never Used  Substance and Sexual Activity  . Alcohol use: Not Currently    Alcohol/week: 0.0 standard drinks  . Drug use: No  . Sexual activity: Yes    Partners: Male    Birth control/protection: Surgical    Comment: hysterectomy  Other Topics Concern  . Not on file  Social History Narrative  . Not on file   Social Determinants of Health   Financial Resource Strain:   . Difficulty of Paying Living Expenses: Not on file  Food Insecurity:   . Worried About Charity fundraiser in the Last Year: Not  on file  . Ran Out of Food in the Last Year: Not on file  Transportation Needs:   . Lack of Transportation (Medical): Not on file  . Lack of Transportation (Non-Medical): Not on file  Physical Activity:   . Days of Exercise per Week: Not on file  . Minutes of Exercise per Session: Not on file  Stress:   . Feeling of Stress : Not on file  Social Connections:   . Frequency of Communication with Friends and Family: Not on file  . Frequency of Social Gatherings with Friends and Family: Not on file  . Attends Religious Services: Not on file  . Active Member of Clubs or Organizations: Not on file  . Attends Archivist Meetings: Not on  file  . Marital Status: Not on file  Intimate Partner Violence:   . Fear of Current or Ex-Partner: Not on file  . Emotionally Abused: Not on file  . Physically Abused: Not on file  . Sexually Abused: Not on file     No Known Allergies   Outpatient Medications Prior to Visit  Medication Sig Dispense Refill  . aspirin 81 MG tablet Take 81 mg by mouth daily.    Marland Kitchen atorvastatin (LIPITOR) 10 MG tablet Take 1 tablet (10 mg total) by mouth daily. 90 tablet 3  . Calcium Carbonate-Vitamin D 600-400 MG-UNIT chew tablet Chew 1 tablet by mouth daily.     . fluticasone (FLONASE) 50 MCG/ACT nasal spray Place 1 spray into both nostrils daily as needed for allergies.     Marland Kitchen ibuprofen (ADVIL) 600 MG tablet Take 600 mg by mouth every 6 (six) hours as needed.    . Olmesartan-Amlodipine-HCTZ 40-10-25 MG TABS Take 1 tablet by mouth every morning.  2  . Vortioxetine HBr (TRINTELLIX) 20 MG TABS Take 20 mg by mouth daily.     No facility-administered medications prior to visit.    Review of Systems  Constitutional: Negative for chills, fever, malaise/fatigue and weight loss.  HENT: Negative for hearing loss, sore throat and tinnitus.   Eyes: Negative for blurred vision and double vision.  Respiratory: Positive for shortness of breath. Negative for cough, hemoptysis, sputum production, wheezing and stridor.   Cardiovascular: Negative for chest pain, palpitations, orthopnea, leg swelling and PND.  Gastrointestinal: Negative for abdominal pain, constipation, diarrhea, heartburn, nausea and vomiting.  Genitourinary: Negative for dysuria, hematuria and urgency.  Musculoskeletal: Negative for joint pain and myalgias.  Skin: Negative for itching and rash.  Neurological: Negative for dizziness, tingling, weakness and headaches.  Endo/Heme/Allergies: Negative for environmental allergies. Does not bruise/bleed easily.  Psychiatric/Behavioral: Negative for depression. The patient is not nervous/anxious and does not  have insomnia.   All other systems reviewed and are negative.    Objective:  Physical Exam Vitals reviewed.  Constitutional:      General: She is not in acute distress.    Appearance: She is well-developed. She is obese.  HENT:     Head: Normocephalic and atraumatic.  Eyes:     General: No scleral icterus.    Conjunctiva/sclera: Conjunctivae normal.     Pupils: Pupils are equal, round, and reactive to light.  Neck:     Vascular: No JVD.     Trachea: No tracheal deviation.  Cardiovascular:     Rate and Rhythm: Normal rate and regular rhythm.     Heart sounds: Normal heart sounds. No murmur.  Pulmonary:     Effort: Pulmonary effort is normal. No tachypnea, accessory muscle usage  or respiratory distress.     Breath sounds: Normal breath sounds. No stridor. No wheezing, rhonchi or rales.  Abdominal:     General: Bowel sounds are normal. There is no distension.     Palpations: Abdomen is soft.     Tenderness: There is no abdominal tenderness.  Musculoskeletal:        General: No tenderness.     Cervical back: Neck supple.  Lymphadenopathy:     Cervical: No cervical adenopathy.  Skin:    General: Skin is warm and dry.     Capillary Refill: Capillary refill takes less than 2 seconds.     Findings: No rash.  Neurological:     Mental Status: She is alert and oriented to person, place, and time.  Psychiatric:        Behavior: Behavior normal.      Vitals:   09/03/19 0857  BP: 118/78  Pulse: (!) 57  Temp: (!) 97.2 F (36.2 C)  TempSrc: Temporal  SpO2: 98%  Weight: 200 lb (90.7 kg)  Height: 5\' 5"  (1.651 m)   98% on RA BMI Readings from Last 3 Encounters:  09/03/19 33.28 kg/m  08/25/19 33.41 kg/m  08/06/19 32.62 kg/m   Wt Readings from Last 3 Encounters:  09/03/19 200 lb (90.7 kg)  08/25/19 200 lb 12.8 oz (91.1 kg)  08/06/19 196 lb (88.9 kg)     CBC    Component Value Date/Time   WBC 6.9 12/27/2015 0833   RBC 5.46 (H) 12/27/2015 0833   HGB 18.7 (H)  01/17/2016 1206   HCT 55.0 (H) 01/17/2016 1206   PLT 241 12/27/2015 0833   MCV 83.2 12/27/2015 0833   MCV 83.4 10/21/2015 1432   MCH 26.7 (L) 12/27/2015 0833   MCHC 32.2 12/27/2015 0833   RDW 14.6 12/27/2015 0833   LYMPHSABS 2,208 12/27/2015 0833   MONOABS 414 12/27/2015 0833   EOSABS 138 12/27/2015 0833   BASOSABS 69 12/27/2015 0833    Chest Imaging: 12/11/2017 chest x-ray: Small hiatal hernia bilateral clear lungs no infiltrate. The patient's images have been independently reviewed by me.    Pulmonary Functions Testing Results: No flowsheet data found.  Echocardiogram:  08/02/2019: Echocardiogram Normal LVEF grade 2 diastolic dysfunction  Myocardial perfusion Lexi scan: 08/16/2019: Normal myocardial perfusion results reviewed.  Heart Catheterization: None     Assessment & Plan:     ICD-10-CM   1. DOE (dyspnea on exertion)  R06.00 DG Chest 2 View    Pulmonary Function Test  2. OSA on CPAP  G47.33    Z99.89   3. Chronic diastolic heart failure (HCC)  I50.32 Pulmonary Function Test  4. History of COVID-19  Z86.16 DG Chest 2 View    Pulmonary Function Test  5. BMI 33.0-33.9,adult  Z68.33     Discussion: 64 year old female with new persistent dyspnea on exertion that is unresolved and yet to defined.  History of COVID-19.  She had mild to moderate symptoms with exacerbated shortness of breath during the time of illness.  Overall no chest film during this time to see whether or not she had evidence of pneumonia.  She has had a relative negative cardiac work-up.  Normal echo and normal myocardial perfusion  Assessment:   . Acute on chronic dyspnea on exertion, history of [id number], chronic diastolic heart failure. I question the possibility of exercise-induced bronchospasm, post viral asthma type etiology  Plan Following Extensive Data Review & Interpretation:  . I reviewed prior external note(s) from 08/25/2019 [facility] cardiology,  Binnie Kand, NP note reviewed . I  reviewed the result(s) of 08/02/2019 echocardiogram, normal LVEF, grade 2 diastolic dysfunction, results reviewed, 08/16/2019 myocardial perfusion Lexi scan, normal cardiac perfusion, results reviewed, 06/30/2019 COVID-19 positive, results reviewed . I have ordered 2 view chest x-ray, full PFTs, albuterol inhaler prn   Pending PFTs and chest found.  If there is significant abnormality in PFTs may need to consider high-resolution CT imaging of the chest.  Predominantly due to the patient's prior COVID-19 history.  Independent interpretation of tests . Review of patient's 12/11/2017 chest x-ray images revealed small hiatal hernia, clear lung fields. The patient's images have been independently reviewed by me.     Current Outpatient Medications:  .  aspirin 81 MG tablet, Take 81 mg by mouth daily., Disp: , Rfl:  .  atorvastatin (LIPITOR) 10 MG tablet, Take 1 tablet (10 mg total) by mouth daily., Disp: 90 tablet, Rfl: 3 .  Calcium Carbonate-Vitamin D 600-400 MG-UNIT chew tablet, Chew 1 tablet by mouth daily. , Disp: , Rfl:  .  fluticasone (FLONASE) 50 MCG/ACT nasal spray, Place 1 spray into both nostrils daily as needed for allergies. , Disp: , Rfl:  .  ibuprofen (ADVIL) 600 MG tablet, Take 600 mg by mouth every 6 (six) hours as needed., Disp: , Rfl:  .  Olmesartan-Amlodipine-HCTZ 40-10-25 MG TABS, Take 1 tablet by mouth every morning., Disp: , Rfl: 2 .  Vortioxetine HBr (TRINTELLIX) 20 MG TABS, Take 20 mg by mouth daily., Disp: , Rfl:    Garner Nash, DO  Pulmonary Critical Care 09/03/2019 9:25 AM

## 2019-09-03 NOTE — Patient Instructions (Addendum)
Thank you for visiting Dr. Valeta Harms at [facility]. Today we recommend the following:  Orders Placed This Encounter  Procedures  . DG Chest 2 View  . Pulmonary Function Test   Meds ordered this encounter  Medications  . albuterol (VENTOLIN HFA) 108 (90 Base) MCG/ACT inhaler    Sig: Inhale 1-2 puffs into the lungs every 6 (six) hours as needed for wheezing or shortness of breath.    Dispense:  18 g    Refill:  1   Return in about 6 weeks (around 10/15/2019).    Please do your part to reduce the spread of COVID-19.

## 2019-09-05 ENCOUNTER — Ambulatory Visit: Payer: BC Managed Care – PPO | Attending: Internal Medicine

## 2019-09-05 DIAGNOSIS — Z23 Encounter for immunization: Secondary | ICD-10-CM | POA: Insufficient documentation

## 2019-09-05 NOTE — Progress Notes (Signed)
   Covid-19 Vaccination Clinic  Name:  [name]    MRN: [id number] DOB: 1956/01/27  09/05/2019  [name] was observed post Covid-19 immunization for 15 minutes without incidence. She was provided with Vaccine Information Sheet and instruction to access the V-Safe system.   [name] was instructed to call 911 with any severe reactions post vaccine: Marland Kitchen Difficulty breathing  . Swelling of your face and throat  . A fast heartbeat  . A bad rash all over your body  . Dizziness and weakness    Immunizations Administered    Name Date Dose VIS Date Route   Pfizer COVID-19 Vaccine 09/05/2019 10:42 AM 0.3 mL 07/09/2019 Intramuscular   Manufacturer: [place]   Lot: [id number]   [place]: [id number]

## 2019-09-29 ENCOUNTER — Ambulatory Visit: Payer: BC Managed Care – PPO | Attending: Internal Medicine

## 2019-09-29 ENCOUNTER — Ambulatory Visit: Payer: BC Managed Care – PPO

## 2019-09-29 DIAGNOSIS — Z23 Encounter for immunization: Secondary | ICD-10-CM | POA: Insufficient documentation

## 2019-09-29 NOTE — Progress Notes (Signed)
   Covid-19 Vaccination Clinic  Name:  [name]    MRN: [id number] DOB: [birth year]  09/29/2019  [name] was observed post Covid-19 immunization for 15 minutes without incident. She was provided with Vaccine Information Sheet and instruction to access the V-Safe system.   [name] was instructed to call 911 with any severe reactions post vaccine: Marland Kitchen Difficulty breathing  . Swelling of face and throat  . A fast heartbeat  . A bad rash all over body  . Dizziness and weakness   Immunizations Administered    Name Date Dose VIS Date Route   Pfizer COVID-19 Vaccine 09/29/2019  3:05 PM 0.3 mL 07/09/2019 Intramuscular   Manufacturer: [place]   Lot: [id number]   [place]: [id number]

## 2019-10-21 DIAGNOSIS — H35363 Drusen (degenerative) of macula, bilateral: Secondary | ICD-10-CM | POA: Diagnosis not present

## 2019-10-21 DIAGNOSIS — H5213 Myopia, bilateral: Secondary | ICD-10-CM | POA: Diagnosis not present

## 2019-10-21 DIAGNOSIS — H04123 Dry eye syndrome of bilateral lacrimal glands: Secondary | ICD-10-CM | POA: Diagnosis not present

## 2019-10-21 DIAGNOSIS — H2513 Age-related nuclear cataract, bilateral: Secondary | ICD-10-CM | POA: Diagnosis not present

## 2019-10-21 DIAGNOSIS — H40013 Open angle with borderline findings, low risk, bilateral: Secondary | ICD-10-CM | POA: Diagnosis not present

## 2019-10-31 ENCOUNTER — Other Ambulatory Visit: Payer: Self-pay | Admitting: Cardiology

## 2019-11-02 ENCOUNTER — Other Ambulatory Visit (HOSPITAL_COMMUNITY)
Admission: RE | Admit: 2019-11-02 | Discharge: 2019-11-02 | Disposition: A | Discharge status: Home / Self Care | Payer: BC Managed Care – PPO | Source: Ambulatory Visit | Attending: Pulmonary Disease | Admitting: Pulmonary Disease

## 2019-11-02 DIAGNOSIS — Z20822 Contact with and (suspected) exposure to covid-19: Secondary | ICD-10-CM | POA: Diagnosis not present

## 2019-11-02 DIAGNOSIS — Z01812 Encounter for preprocedural laboratory examination: Secondary | ICD-10-CM | POA: Insufficient documentation

## 2019-11-02 LAB — SARS CORONAVIRUS 2 (TAT 6-24 HRS): SARS Coronavirus 2: NEGATIVE

## 2019-11-05 ENCOUNTER — Telehealth: Payer: Self-pay | Admitting: Primary Care

## 2019-11-05 ENCOUNTER — Ambulatory Visit: Payer: BC Managed Care – PPO | Admitting: Primary Care

## 2019-11-05 ENCOUNTER — Other Ambulatory Visit: Payer: Self-pay

## 2019-11-05 ENCOUNTER — Encounter: Payer: Self-pay | Admitting: Primary Care

## 2019-11-05 ENCOUNTER — Ambulatory Visit (INDEPENDENT_AMBULATORY_CARE_PROVIDER_SITE_OTHER): Payer: BC Managed Care – PPO | Admitting: Pulmonary Disease

## 2019-11-05 VITALS — BP 126/68 | HR 72 | Temp 97.0°F | Ht 65.0 in | Wt 199.0 lb

## 2019-11-05 DIAGNOSIS — R0609 Other forms of dyspnea: Secondary | ICD-10-CM | POA: Insufficient documentation

## 2019-11-05 DIAGNOSIS — R0602 Shortness of breath: Secondary | ICD-10-CM

## 2019-11-05 DIAGNOSIS — K449 Diaphragmatic hernia without obstruction or gangrene: Secondary | ICD-10-CM

## 2019-11-05 DIAGNOSIS — R0600 Dyspnea, unspecified: Secondary | ICD-10-CM | POA: Insufficient documentation

## 2019-11-05 DIAGNOSIS — K219 Gastro-esophageal reflux disease without esophagitis: Secondary | ICD-10-CM | POA: Diagnosis not present

## 2019-11-05 DIAGNOSIS — I5032 Chronic diastolic (congestive) heart failure: Secondary | ICD-10-CM

## 2019-11-05 DIAGNOSIS — Z8616 Personal history of COVID-19: Secondary | ICD-10-CM

## 2019-11-05 DIAGNOSIS — Z9989 Dependence on other enabling machines and devices: Secondary | ICD-10-CM

## 2019-11-05 DIAGNOSIS — G4733 Obstructive sleep apnea (adult) (pediatric): Secondary | ICD-10-CM | POA: Diagnosis not present

## 2019-11-05 LAB — PULMONARY FUNCTION TEST
DL/VA % pred: 129 %
DL/VA: 5.4 ml/min/mmHg/L
DLCO cor % pred: 104 %
DLCO cor: 21.75 ml/min/mmHg
DLCO unc % pred: 104 %
DLCO unc: 21.75 ml/min/mmHg
FEF 25-75 Post: 2.02 L/sec
FEF 25-75 Pre: 1.76 L/sec
FEF2575-%Change-Post: 15 %
FEF2575-%Pred-Post: 87 %
FEF2575-%Pred-Pre: 75 %
FEV1-%Change-Post: 4 %
FEV1-%Pred-Post: 73 %
FEV1-%Pred-Pre: 70 %
FEV1-Post: 1.91 L
FEV1-Pre: 1.83 L
FEV1FVC-%Change-Post: 2 %
FEV1FVC-%Pred-Pre: 103 %
FEV6-%Change-Post: 1 %
FEV6-%Pred-Post: 71 %
FEV6-%Pred-Pre: 70 %
FEV6-Post: 2.33 L
FEV6-Pre: 2.29 L
FEV6FVC-%Change-Post: 0 %
FEV6FVC-%Pred-Post: 103 %
FEV6FVC-%Pred-Pre: 103 %
FVC-%Change-Post: 1 %
FVC-%Pred-Post: 69 %
FVC-%Pred-Pre: 67 %
FVC-Post: 2.33 L
FVC-Pre: 2.29 L
Post FEV1/FVC ratio: 82 %
Post FEV6/FVC ratio: 100 %
Pre FEV1/FVC ratio: 80 %
Pre FEV6/FVC Ratio: 100 %
RV % pred: 80 %
RV: 1.68 L
TLC % pred: 79 %
TLC: 4.15 L

## 2019-11-05 MED ORDER — AEROCHAMBER MV MISC: 0 refills | Status: DC

## 2019-11-05 MED ORDER — OMEPRAZOLE 20 MG PO CPDR: 20.0000 mg | DELAYED_RELEASE_CAPSULE | Freq: Every day | ORAL | 1 refills | Status: DC

## 2019-11-05 MED ORDER — BUDESONIDE 90 MCG/ACT IN AEPB: 2.0000 | INHALATION_SPRAY | Freq: Two times a day (BID) | RESPIRATORY_TRACT | 0 refills | Status: DC

## 2019-11-05 NOTE — Assessment & Plan Note (Signed)
-   Encourage patient resume CPAP therapy

## 2019-11-05 NOTE — Telephone Encounter (Signed)
error 

## 2019-11-05 NOTE — Assessment & Plan Note (Signed)
-   CXR showed moderate hiatal hernia - RX Omeprazole 20mg  once daily before first meal of the day - Encourage GERD diet and weight loss  - Refer to GI

## 2019-11-05 NOTE — Assessment & Plan Note (Addendum)
Consulted with Pulmonary back in February 2021 for dyspnea. She continues to have dyspnea with activity. No wheezing or chest tightness. Associated Upper airway cough and GERD symptoms. She has grade 2 diastolic dysfunction on her echocardiogram. PFTs today showed evidence of restrictive lung disease no bronchodilator response. DLCO was normal. Chest x-ray was clear. She was noted to have a moderate hiatal hernia which could be contributing to her dyspnea. No improvement from Albuterol hfa. Clinical picture not consistent with asthma, however, I will try her on a sample of an ICS inhaler Pulmicort 63mcg d/t some reversibility on PFTs in her small airways. I think her dyspnea is likely multifactorial d/t BMI, deconditioning, diastolic dysfuction and moderate hiatal hernia   - Plan: Pulmicort 18mcg two puffs twice daily (rinse mouth after use), prn albuterol hfa two puffs twice daily with spacer - Follow-up: 4 weeks with NP

## 2019-11-05 NOTE — Progress Notes (Signed)
Full PFT performed today. °

## 2019-11-05 NOTE — Patient Instructions (Addendum)
Referral: Gastroenterology re: hiatal hernia/ GERD Nutrition re: BMI   Rx: Pulmicort flexhaler- two puffs twice daily x 2 weeks (if helpful call us and we will send in prescription) Spacer to use with Albuterol - use two puffs every 4-6 hours for breakthrough shortness of breath or wheezing  Omeprazole 20mg  once daily before first meal of the day  FU : 4 weeks with Dr. Valeta Harms or [place] for Gastroesophageal Reflux Disease, Adult When you have gastroesophageal reflux disease (GERD), the foods you eat and your eating habits are very important. Choosing the right foods can help ease your discomfort. Think about working with a nutrition specialist (dietitian) to help you make good choices. What are tips for following this plan?  Meals  Choose healthy foods that are low in fat, such as fruits, vegetables, whole grains, low-fat dairy products, and lean meat, fish, and poultry.  Eat small meals often instead of 3 large meals a day. Eat your meals slowly, and in a place where you are relaxed. Avoid bending over or lying down until 2-3 hours after eating.  Avoid eating meals 2-3 hours before bed.  Avoid drinking a lot of liquid with meals.  Cook foods using methods other than frying. Bake, grill, or broil food instead.  Avoid or limit: ? Chocolate. ? Peppermint or spearmint. ? Alcohol. ? Pepper. ? Black and decaffeinated coffee. ? Black and decaffeinated tea. ? Bubbly (carbonated) soft drinks. ? Caffeinated energy drinks and soft drinks.  Limit high-fat foods such as: ? Fatty meat or fried foods. ? Whole milk, cream, butter, or ice cream. ? Nuts and nut butters. ? Pastries, donuts, and sweets made with butter or shortening.  Avoid foods that cause symptoms. These foods may be different for everyone. Common foods that cause symptoms include: ? Tomatoes. ? Oranges, lemons, and limes. ? Peppers. ? Spicy food. ? Onions and garlic. ? Vinegar. Lifestyle  Maintain a  healthy weight. Ask your doctor what weight is healthy for you. If you need to lose weight, work with your doctor to do so safely.  Exercise for at least 30 minutes for 5 or more days each week, or as told by your doctor.  Wear loose-fitting clothes.  Do not smoke. If you need help quitting, ask your doctor.  Sleep with the head of your bed higher than your feet. Use a wedge under the mattress or blocks under the bed frame to raise the head of the bed. Summary  When you have gastroesophageal reflux disease (GERD), food and lifestyle choices are very important in easing your symptoms.  Eat small meals often instead of 3 large meals a day. Eat your meals slowly, and in a place where you are relaxed.  Limit high-fat foods such as fatty meat or fried foods.  Avoid bending over or lying down until 2-3 hours after eating.  Avoid peppermint and spearmint, caffeine, alcohol, and chocolate. This information is not intended to replace advice given to you by your health care provider. Make sure you discuss any questions you have with your health care provider. Document Revised: 11/05/2018 Document Reviewed: 08/20/2016 Elsevier Patient Education  [employer].

## 2019-11-05 NOTE — Progress Notes (Signed)
@Patient  ID: [name], female    DOB: 03-08-1956, 64 y.o.   MRN: [id number]  Chief Complaint  Patient presents with  . Follow-up    Referring provider: Prince Solian, MD  HPI: 64 year old female, never smoked. PMH significant for OSA on CPAP, dyspnea on exertion, chronic diastolic heart failure. Patient of Dr. Valeta Harms, seen for initial consult in February for Dyspnea.    11/05/2019 Patient presents today for 2 months follow-up with PFTs. She has had shortness of breath for several year. Shortness of breath occurs during activity. No associated wheezing or chest tightness. She has an upper airway non-productive cough. She does have acid reflux symptoms, not currently on PPI. No hx childhood asthma. She had COVID-19 in December. She has sleep apnea, not currently wearing CPAP. She line dances 1-2 times a week for activity.  Significant testing: 11/05/2019 PFTs - FVC 2.33 (69%), FEV1 1.91 (73%), ratio 82, TLC 79%, DLCOcor 21.75 (104%)  09/03/19 CXR- No acute intrathoracic process. Moderate hiatal hernia again noted. Stable cardiac silhouette.  08/02/19 Echocardiogram- Mild LVH, normal systolic function. EF 62%. Grade 2 diastolic dysfunction.   No Known Allergies  Immunization History  Administered Date(s) Administered  . Influenza,inj,Quad PF,6+ Mos 04/29/2019  . Influenza-Unspecified 03/29/2018  . PFIZER SARS-COV-2 Vaccination 09/05/2019, 09/29/2019    Past Medical History:  Diagnosis Date  . Anxiety   . Daytime somnolence 04/06/2013  . DDD (degenerative disc disease), lumbar   . Depression   . Hypercholesteremia   . Hypertension   . OSA on CPAP 04/06/2013  . Urinary incontinence     Tobacco History: Social History   Tobacco Use  Smoking Status Never Smoker  Smokeless Tobacco Never Used   Counseling given: Not Answered   Outpatient Medications Prior to Visit  Medication Sig Dispense Refill  . albuterol (VENTOLIN HFA) 108 (90 Base) MCG/ACT inhaler Inhale 1-2 puffs  into the lungs every 6 (six) hours as needed for wheezing or shortness of breath. 18 g 1  . aspirin 81 MG tablet Take 81 mg by mouth daily.    Marland Kitchen atorvastatin (LIPITOR) 10 MG tablet Take 1 tablet (10 mg total) by mouth daily. 90 tablet 3  . Calcium Carbonate-Vitamin D 600-400 MG-UNIT chew tablet Chew 1 tablet by mouth daily.     . fluticasone (FLONASE) 50 MCG/ACT nasal spray Place 1 spray into both nostrils daily as needed for allergies.     Marland Kitchen ibuprofen (ADVIL) 600 MG tablet Take 600 mg by mouth every 6 (six) hours as needed.    . Olmesartan-Amlodipine-HCTZ 40-10-25 MG TABS Take 1 tablet by mouth every morning.  2  . Vortioxetine HBr (TRINTELLIX) 20 MG TABS Take 20 mg by mouth daily.     No facility-administered medications prior to visit.   Review of Systems  Review of Systems  Respiratory: Positive for shortness of breath. Negative for cough, chest tightness and wheezing.   Cardiovascular: Negative for chest pain, palpitations and leg swelling.  Gastrointestinal:       GERD   Physical Exam  BP 126/68 (BP Location: Left Arm, Cuff Size: Normal)   Pulse 72   Temp (!) 97 F (36.1 C) (Temporal)   Ht 5\' 5"  (1.651 m)   Wt 199 lb (90.3 kg)   SpO2 97% Comment: RA  BMI 33.12 kg/m  Physical Exam Constitutional:      Appearance: Normal appearance.  HENT:     Head: Normocephalic and atraumatic.     Mouth/Throat:     Mouth: Mucous  membranes are moist.     Pharynx: Oropharynx is clear.  Cardiovascular:     Rate and Rhythm: Normal rate and regular rhythm.  Pulmonary:     Effort: Pulmonary effort is normal.     Breath sounds: Normal breath sounds. No wheezing.  Musculoskeletal:     Cervical back: Normal range of motion and neck supple.  Skin:    General: Skin is warm and dry.  Neurological:     General: No focal deficit present.     Mental Status: She is alert and oriented to person, place, and time. Mental status is at baseline.  Psychiatric:        Mood and Affect: Mood normal.         Behavior: Behavior normal.        Thought Content: Thought content normal.        Judgment: Judgment normal.      Lab Results:  CBC    Component Value Date/Time   WBC 6.9 12/27/2015 0833   RBC 5.46 (H) 12/27/2015 0833   HGB 18.7 (H) 01/17/2016 1206   HCT 55.0 (H) 01/17/2016 1206   PLT 241 12/27/2015 0833   MCV 83.2 12/27/2015 0833   MCV 83.4 10/21/2015 1432   MCH 26.7 (L) 12/27/2015 0833   MCHC 32.2 12/27/2015 0833   RDW 14.6 12/27/2015 0833   LYMPHSABS 2,208 12/27/2015 0833   MONOABS 414 12/27/2015 0833   EOSABS 138 12/27/2015 0833   BASOSABS 69 12/27/2015 0833    BMET    Component Value Date/Time   NA 144 01/17/2016 1206   K 4.0 01/17/2016 1206   CL 108 01/17/2016 1206   CO2 23 12/27/2015 0833   GLUCOSE 98 01/17/2016 1206   BUN 14 01/17/2016 1206   CREATININE 0.80 01/17/2016 1206   CREATININE 0.84 12/27/2015 0833   CALCIUM 9.5 12/27/2015 0833   GFRNONAA 33 (L) 09/29/2015 1553   GFRAA 38 (L) 09/29/2015 1553    BNP No results found for: BNP  ProBNP No results found for: PROBNP  Imaging: No results found.   Assessment & Plan:   Dyspnea on exertion Consulted with Pulmonary back in February 2021 for dyspnea. She continues to have dyspnea with activity. No wheezing or chest tightness. Associated Upper airway cough and GERD symptoms. She has grade 2 diastolic dysfunction on her echocardiogram. PFTs today showed evidence of restrictive lung disease no bronchodilator response. DLCO was normal. Chest x-ray was clear. She was noted to have a moderate hiatal hernia which could be contributing to her dyspnea. No improvement from Albuterol hfa. Clinical picture not consistent with asthma, however, I will try her on a sample of an ICS inhaler Pulmicort 49mcg d/t some reversibility on PFTs in her small airways. I think her dyspnea is likely multifactorial d/t BMI, deconditioning, diastolic dysfuction and moderate hiatal hernia   - Plan: Pulmicort 45mcg two puffs  twice daily (rinse mouth after use), prn albuterol hfa two puffs twice daily with spacer - Follow-up: 4 weeks with NP   Hiatal hernia - CXR showed moderate hiatal hernia - RX Omeprazole 20mg  once daily before first meal of the day - Encourage GERD diet and weight loss  - Refer to GI  OSA on CPAP - Encourage patient resume CPAP therapy    Martyn Ehrich, NP 11/05/2019

## 2019-11-06 LAB — BRAIN NATRIURETIC PEPTIDE: Brain Natriuretic Peptide: 62 pg/mL (ref <100)

## 2019-11-07 NOTE — Progress Notes (Signed)
Please let patient know BNP was normal

## 2019-11-08 ENCOUNTER — Encounter: Payer: Self-pay | Admitting: Primary Care

## 2019-11-08 NOTE — Telephone Encounter (Signed)
Steroid inhaler is twice a day scheduled. Albuterol is as needed. Ok to see GI doctor that she is established with

## 2019-11-08 NOTE — Telephone Encounter (Signed)
Beth please advise on patients mychart message:  Hi Ms. Volanda Napoleon, Following my visit on Friday I realized I have a GI Dr.  Lilian Coma you please make the referral to Dr. Cristina Gong?  Also, I was previously told to use albuterol as needed.  Is that still the case with the steroid nebulizer?  The label directions indicate differently. Thank you, Cindy Hazy

## 2019-11-10 ENCOUNTER — Telehealth: Payer: Self-pay | Admitting: Primary Care

## 2019-11-10 NOTE — Telephone Encounter (Signed)
-----   Message from Garner Nash, DO sent at 11/09/2019  4:28 PM EDT ----- Regarding: RE: follow-up after your consult Thanks for seeing her. I took a look.  I agree. Lets see how she does.  I really appreciate you seeing her in my absence Thanks  Brad  ----- Message ----- From: Martyn Ehrich, NP Sent: 11/05/2019   4:30 PM EDT To: Garner Nash, DO Subject: follow-up after your consult                   You originally saw this patient for a consult back in February for dyspnea. She continues to have dyspnea with activity. No wheezing or chest tightness. Upper airway cough and GERD symptoms. She has grade 2 diastolic dysfunction on her echocardiogram. PFTs today showed evidence of restrictive lung disease no bronchodilator response. DLCO was normal. Chest x-ray was clear. She was noted to have a moderate hiatal hernia which could be contributing to her dyspnea. No improvement from Albuterol hfa.   Her symptoms are not consistent with clinical asthma, however, I will try her on a sample of an ICS inhaler Pulmicort 29mcg.  I think her dyspnea is d/t BMI, deconditioning, diastolic dysfuction and moderate hiatial hernia   Can you just look at PFTs

## 2019-11-12 DIAGNOSIS — D485 Neoplasm of uncertain behavior of skin: Secondary | ICD-10-CM | POA: Diagnosis not present

## 2019-11-14 NOTE — Progress Notes (Signed)
Thanks for seeing her. Garner Nash, DO [facility] 11/14/2019 11:31 AM

## 2019-12-02 DIAGNOSIS — M109 Gout, unspecified: Secondary | ICD-10-CM | POA: Diagnosis not present

## 2019-12-02 DIAGNOSIS — M859 Disorder of bone density and structure, unspecified: Secondary | ICD-10-CM | POA: Diagnosis not present

## 2019-12-02 DIAGNOSIS — K219 Gastro-esophageal reflux disease without esophagitis: Secondary | ICD-10-CM | POA: Diagnosis not present

## 2019-12-02 DIAGNOSIS — E042 Nontoxic multinodular goiter: Secondary | ICD-10-CM | POA: Diagnosis not present

## 2019-12-02 DIAGNOSIS — Z0000 Encounter for general adult medical examination without abnormal findings: Secondary | ICD-10-CM | POA: Diagnosis not present

## 2019-12-02 DIAGNOSIS — E7849 Other hyperlipidemia: Secondary | ICD-10-CM | POA: Diagnosis not present

## 2019-12-02 DIAGNOSIS — R05 Cough: Secondary | ICD-10-CM | POA: Diagnosis not present

## 2019-12-07 ENCOUNTER — Other Ambulatory Visit: Payer: Self-pay | Admitting: Primary Care

## 2019-12-08 ENCOUNTER — Other Ambulatory Visit: Payer: Self-pay

## 2019-12-08 ENCOUNTER — Encounter: Payer: Self-pay | Admitting: Pulmonary Disease

## 2019-12-08 ENCOUNTER — Ambulatory Visit: Payer: BC Managed Care – PPO | Admitting: Pulmonary Disease

## 2019-12-08 DIAGNOSIS — K219 Gastro-esophageal reflux disease without esophagitis: Secondary | ICD-10-CM

## 2019-12-08 DIAGNOSIS — I5032 Chronic diastolic (congestive) heart failure: Secondary | ICD-10-CM | POA: Diagnosis not present

## 2019-12-08 DIAGNOSIS — K449 Diaphragmatic hernia without obstruction or gangrene: Secondary | ICD-10-CM

## 2019-12-08 NOTE — Progress Notes (Signed)
Synopsis: Referred in February 2021 for dyspnea on exertion by Prince Solian, MD  Subjective:   PATIENT ID: [name] GENDER: female DOB: 1955-12-15, MRN: [id number]  Chief Complaint  Patient presents with  . Follow-up    This is a 64 year old female, past medical history of OSA on CPAP, hypertension, hyper bulimia.  Referred for evaluation of dyspnea.  Patient saw Binnie Kand, NP cardiology office 08/25/2019.  Office visit reviewed.  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  Had ablation attempted by Dr. Lovena Le 2017 which was unsuccessful later seen by Dr. Glennon Mac at [facility] in 2019.  Had repeat ablation.  Echo with normal LVEF and grade 2 diastolic dysfunction.  She had a recent stress test which revealed no perfusion abnormalities etiology of her dyspnea have remained unclear.  Not felt to be related to heart failure.  Decision was made to refer to pulmonary medicine for evaluation.  OV 09/03/2019: she states that when she had covid19 she lost her taste and smell. Her SOB however has been going on for some time. She limited her exercise tolerance due to SVT history but restarted her exercise routine after her ablation. She currently works for [employer]. She has back problems and had back surgery. She has had some limitation due to this. Lifelong non-smoker.  Patient denies hemoptysis chest tightness.  Unsure about wheezing.  Does have significant dyspnea with climbing stairs or minimal exertion for activities of daily living.  OV 12/08/2019: Here today for follow-up.  Overall doing well.  Respiratory symptoms have resolved.  Her cough is better after starting PPI.  She is trying to become more active.  Lifelong non-smoker.  Denies hemoptysis, weight loss fevers chills night sweats nausea vomiting.   Past Medical History:  Diagnosis Date  . Anxiety   . Daytime somnolence 04/06/2013  . DDD (degenerative disc disease), lumbar   . Depression   . Hypercholesteremia   . Hypertension    . OSA on CPAP 04/06/2013  . Urinary incontinence      Family History  Problem Relation Age of Onset  . Heart disease Father   . Hypertension Father   . Stroke Father   . Diabetes Maternal Grandmother      Past Surgical History:  Procedure Laterality Date  . ABLATION  09/29/2017  . BACK SURGERY  04/06/2019  . BREAST EXCISIONAL BIOPSY Left   . ELECTROPHYSIOLOGIC STUDY N/A 01/01/2016   Procedure: SVT Ablation;  Surgeon: Evans Lance, MD;  Location: [facility];  Service: Cardiovascular;  Laterality: N/A;  . HAND SURGERY Right   . KNEE ARTHROPLASTY Left   . NASAL SEPTUM SURGERY    . RADICAL HYSTERECTOMY  2000  . TONSILLECTOMY AND ADENOIDECTOMY      Social History   Socioeconomic History  . Marital status: Married    Spouse name: Not on file  . Number of children: 2  . Years of education: Not on file  . Highest education level: Not on file  Occupational History  . Not on file  Tobacco Use  . Smoking status: Never Smoker  . Smokeless tobacco: Never Used  Substance and Sexual Activity  . Alcohol use: Not Currently    Alcohol/week: 0.0 standard drinks  . Drug use: No  . Sexual activity: Yes    Partners: Male    Birth control/protection: Surgical    Comment: hysterectomy  Other Topics Concern  . Not on file  Social History Narrative  . Not on file   Social  Determinants of Health   Financial Resource Strain:   . Difficulty of Paying Living Expenses:   Food Insecurity:   . Worried About Charity fundraiser in the Last Year:   . Arboriculturist in the Last Year:   Transportation Needs:   . Film/video editor (Medical):   Marland Kitchen Lack of Transportation (Non-Medical):   Physical Activity:   . Days of Exercise per Week:   . Minutes of Exercise per Session:   Stress:   . Feeling of Stress :   Social Connections:   . Frequency of Communication with Friends and Family:   . Frequency of Social Gatherings with Friends and Family:   . Attends Religious Services:    . Active Member of Clubs or Organizations:   . Attends Archivist Meetings:   Marland Kitchen Marital Status:   Intimate Partner Violence:   . Fear of Current or Ex-Partner:   . Emotionally Abused:   Marland Kitchen Physically Abused:   . Sexually Abused:      No Known Allergies   Outpatient Medications Prior to Visit  Medication Sig Dispense Refill  . albuterol (VENTOLIN HFA) 108 (90 Base) MCG/ACT inhaler Inhale 1-2 puffs into the lungs every 6 (six) hours as needed for wheezing or shortness of breath. 18 g 1  . aspirin 81 MG tablet Take 81 mg by mouth daily.    . Budesonide 90 MCG/ACT inhaler Inhale 2 puffs into the lungs 2 (two) times daily. 1 each 0  . Calcium Carbonate-Vitamin D 600-400 MG-UNIT chew tablet Chew 1 tablet by mouth daily.     . fluticasone (FLONASE) 50 MCG/ACT nasal spray Place 1 spray into both nostrils daily as needed for allergies.     Marland Kitchen ibuprofen (ADVIL) 600 MG tablet Take 600 mg by mouth every 6 (six) hours as needed.    . Olmesartan-Amlodipine-HCTZ 40-10-25 MG TABS Take 1 tablet by mouth every morning.  2  . omeprazole (PRILOSEC) 20 MG capsule Take 1 capsule (20 mg total) by mouth daily. (Patient taking differently: Take 40 mg by mouth 2 (two) times daily before a meal. ) 30 capsule 1  . Spacer/Aero-Holding Chambers (AEROCHAMBER MV) inhaler Use as instructed 1 each 0  . Vortioxetine HBr (TRINTELLIX) 20 MG TABS Take 20 mg by mouth daily.    Marland Kitchen atorvastatin (LIPITOR) 10 MG tablet Take 1 tablet (10 mg total) by mouth daily. 90 tablet 3   No facility-administered medications prior to visit.    Review of Systems  Constitutional: Negative for chills, fever, malaise/fatigue and weight loss.  HENT: Negative for hearing loss, sore throat and tinnitus.   Eyes: Negative for blurred vision and double vision.  Respiratory: Negative for cough, hemoptysis, sputum production, shortness of breath, wheezing and stridor.   Cardiovascular: Negative for chest pain, palpitations, orthopnea, leg  swelling and PND.  Gastrointestinal: Negative for abdominal pain, constipation, diarrhea, heartburn, nausea and vomiting.  Genitourinary: Negative for dysuria, hematuria and urgency.  Musculoskeletal: Negative for joint pain and myalgias.  Skin: Negative for itching and rash.  Neurological: Negative for dizziness, tingling, weakness and headaches.  Endo/Heme/Allergies: Negative for environmental allergies. Does not bruise/bleed easily.  Psychiatric/Behavioral: Negative for depression. The patient is not nervous/anxious and does not have insomnia.   All other systems reviewed and are negative.    Objective:  Physical Exam Vitals reviewed.  Constitutional:      General: She is not in acute distress.    Appearance: She is well-developed. She is obese.  HENT:     Head: Normocephalic and atraumatic.  Eyes:     General: No scleral icterus.    Conjunctiva/sclera: Conjunctivae normal.     Pupils: Pupils are equal, round, and reactive to light.  Neck:     Vascular: No JVD.     Trachea: No tracheal deviation.  Cardiovascular:     Rate and Rhythm: Normal rate and regular rhythm.     Heart sounds: Normal heart sounds. No murmur.  Pulmonary:     Effort: Pulmonary effort is normal. No tachypnea, accessory muscle usage or respiratory distress.     Breath sounds: Normal breath sounds. No stridor. No wheezing, rhonchi or rales.  Abdominal:     General: Bowel sounds are normal. There is no distension.     Palpations: Abdomen is soft.     Tenderness: There is no abdominal tenderness.  Musculoskeletal:        General: No tenderness.     Cervical back: Neck supple.  Lymphadenopathy:     Cervical: No cervical adenopathy.  Skin:    General: Skin is warm and dry.     Capillary Refill: Capillary refill takes less than 2 seconds.     Findings: No rash.  Neurological:     Mental Status: She is alert and oriented to person, place, and time.  Psychiatric:        Behavior: Behavior normal.       Vitals:   12/08/19 1453  BP: 128/72  Pulse: 71  Temp: (!) 97.3 F (36.3 C)  TempSrc: Temporal  SpO2: 97%  Weight: 198 lb 3.2 oz (89.9 kg)  Height: 5\' 5"  (1.651 m)   97% on RA BMI Readings from Last 3 Encounters:  12/08/19 32.98 kg/m  11/05/19 33.12 kg/m  09/03/19 33.28 kg/m   Wt Readings from Last 3 Encounters:  12/08/19 198 lb 3.2 oz (89.9 kg)  11/05/19 199 lb (90.3 kg)  09/03/19 200 lb (90.7 kg)     CBC    Component Value Date/Time   WBC 6.9 12/27/2015 0833   RBC 5.46 (H) 12/27/2015 0833   HGB 18.7 (H) 01/17/2016 1206   HCT 55.0 (H) 01/17/2016 1206   PLT 241 12/27/2015 0833   MCV 83.2 12/27/2015 0833   MCV 83.4 10/21/2015 1432   MCH 26.7 (L) 12/27/2015 0833   MCHC 32.2 12/27/2015 0833   RDW 14.6 12/27/2015 0833   LYMPHSABS 2,208 12/27/2015 0833   MONOABS 414 12/27/2015 0833   EOSABS 138 12/27/2015 0833   BASOSABS 69 12/27/2015 0833    Chest Imaging: 12/11/2017 chest x-ray: Small hiatal hernia bilateral clear lungs no infiltrate. The patient's images have been independently reviewed by me.    Pulmonary Functions Testing Results: PFT Results Latest Ref Rng & Units 11/05/2019  FVC-Pre L 2.29  FVC-Predicted Pre % 67  FVC-Post L 2.33  FVC-Predicted Post % 69  Pre FEV1/FVC % % 80  Post FEV1/FCV % % 82  FEV1-Pre L 1.83  FEV1-Predicted Pre % 70  FEV1-Post L 1.91  DLCO UNC% % 104  DLCO COR %Predicted % 129  TLC L 4.15  TLC % Predicted % 79  RV % Predicted % 80    Echocardiogram:  08/02/2019: Echocardiogram Normal LVEF grade 2 diastolic dysfunction  Myocardial perfusion Lexi scan: 08/16/2019: Normal myocardial perfusion results reviewed.  Heart Catheterization: None     Assessment & Plan:     ICD-10-CM   1. Morbid obesity ([place])  E66.01 Amb ref to Medical Nutrition Therapy-MNT  2. Hiatal  hernia  K44.9   3. Chronic diastolic heart failure ([facility])  I50.32   4. Gastroesophageal reflux disease, unspecified whether esophagitis present  K21.9      Discussion: This is a 64 year old female history of COVID-19.  Patient had mild to moderate symptoms.  Persistent dyspnea afterwards.  Cardiac work-up negative normal myocardial perfusion imaging.  Additionally has grade 2 diastolic dysfunction on previous echocardiogram.  Pulmonary function test with reduced FEV1 FVC preserved ratio impaired spirometry, prism pattern.  Also has a reduced ERV likely related to body habitus.  Plan: Continue PPI Continue weight loss regimen Referral to nutrition per patient request. Follow-up with primary care/cardiology for hypertension and diastolic dysfunction management. Return to clinic as needed here at pulmonary.    Current Outpatient Medications:  .  albuterol (VENTOLIN HFA) 108 (90 Base) MCG/ACT inhaler, Inhale 1-2 puffs into the lungs every 6 (six) hours as needed for wheezing or shortness of breath., Disp: 18 g, Rfl: 1 .  aspirin 81 MG tablet, Take 81 mg by mouth daily., Disp: , Rfl:  .  Budesonide 90 MCG/ACT inhaler, Inhale 2 puffs into the lungs 2 (two) times daily., Disp: 1 each, Rfl: 0 .  Calcium Carbonate-Vitamin D 600-400 MG-UNIT chew tablet, Chew 1 tablet by mouth daily. , Disp: , Rfl:  .  fluticasone (FLONASE) 50 MCG/ACT nasal spray, Place 1 spray into both nostrils daily as needed for allergies. , Disp: , Rfl:  .  ibuprofen (ADVIL) 600 MG tablet, Take 600 mg by mouth every 6 (six) hours as needed., Disp: , Rfl:  .  Olmesartan-Amlodipine-HCTZ 40-10-25 MG TABS, Take 1 tablet by mouth every morning., Disp: , Rfl: 2 .  omeprazole (PRILOSEC) 20 MG capsule, Take 1 capsule (20 mg total) by mouth daily. (Patient taking differently: Take 40 mg by mouth 2 (two) times daily before a meal. ), Disp: 30 capsule, Rfl: 1 .  Spacer/Aero-Holding Chambers (AEROCHAMBER MV) inhaler, Use as instructed, Disp: 1 each, Rfl: 0 .  atorvastatin (LIPITOR) 10 MG tablet, Take 1 tablet (10 mg total) by mouth daily., Disp: 90 tablet, Rfl: 3   Garner Nash,  DO [facility] 12/08/2019 3:22 PM

## 2019-12-08 NOTE — Patient Instructions (Signed)
Thank you for visiting Dr. Valeta Harms at [facility]. Today we recommend the following:  Orders Placed This Encounter  Procedures  . Amb ref to Medical Nutrition Therapy-MNT   Return if symptoms worsen or fail to improve.    Please do your part to reduce the spread of COVID-19.

## 2019-12-09 DIAGNOSIS — R82998 Other abnormal findings in urine: Secondary | ICD-10-CM | POA: Diagnosis not present

## 2019-12-09 DIAGNOSIS — M4802 Spinal stenosis, cervical region: Secondary | ICD-10-CM | POA: Diagnosis not present

## 2019-12-09 DIAGNOSIS — I10 Essential (primary) hypertension: Secondary | ICD-10-CM | POA: Diagnosis not present

## 2019-12-09 DIAGNOSIS — Z0000 Encounter for general adult medical examination without abnormal findings: Secondary | ICD-10-CM | POA: Diagnosis not present

## 2019-12-09 DIAGNOSIS — E785 Hyperlipidemia, unspecified: Secondary | ICD-10-CM | POA: Diagnosis not present

## 2019-12-09 DIAGNOSIS — I471 Supraventricular tachycardia: Secondary | ICD-10-CM | POA: Diagnosis not present

## 2019-12-10 DIAGNOSIS — Z1331 Encounter for screening for depression: Secondary | ICD-10-CM | POA: Diagnosis not present

## 2019-12-13 DIAGNOSIS — Z1212 Encounter for screening for malignant neoplasm of rectum: Secondary | ICD-10-CM | POA: Diagnosis not present

## 2019-12-14 ENCOUNTER — Other Ambulatory Visit: Payer: Self-pay | Admitting: *Deleted

## 2019-12-14 MED ORDER — BUDESONIDE 90 MCG/ACT IN AEPB: 2.0000 | INHALATION_SPRAY | Freq: Two times a day (BID) | RESPIRATORY_TRACT | 5 refills | Status: DC

## 2019-12-19 DIAGNOSIS — M79675 Pain in left toe(s): Secondary | ICD-10-CM | POA: Diagnosis not present

## 2019-12-19 DIAGNOSIS — S99922A Unspecified injury of left foot, initial encounter: Secondary | ICD-10-CM | POA: Diagnosis not present

## 2019-12-23 ENCOUNTER — Other Ambulatory Visit: Payer: Self-pay | Admitting: Primary Care

## 2020-01-19 ENCOUNTER — Other Ambulatory Visit: Payer: Self-pay

## 2020-01-19 ENCOUNTER — Ambulatory Visit: Payer: BC Managed Care – PPO | Admitting: Cardiology

## 2020-01-19 ENCOUNTER — Encounter: Payer: Self-pay | Admitting: Cardiology

## 2020-01-19 VITALS — BP 124/64 | HR 50 | Ht 65.0 in | Wt 190.3 lb

## 2020-01-19 DIAGNOSIS — R0609 Other forms of dyspnea: Secondary | ICD-10-CM

## 2020-01-19 DIAGNOSIS — I5032 Chronic diastolic (congestive) heart failure: Secondary | ICD-10-CM | POA: Insufficient documentation

## 2020-01-19 DIAGNOSIS — I10 Essential (primary) hypertension: Secondary | ICD-10-CM | POA: Diagnosis not present

## 2020-01-19 DIAGNOSIS — R0600 Dyspnea, unspecified: Secondary | ICD-10-CM

## 2020-01-19 MED ORDER — SPIRONOLACTONE 25 MG PO TABS: 25.0000 mg | ORAL_TABLET | Freq: Every day | ORAL | 3 refills | Status: DC

## 2020-01-19 NOTE — Progress Notes (Signed)
Follow up visit  Subjective:   [name], female    DOB: [birth year], 64 y.o.   MRN: [id number]   HPI   Chief Complaint  Patient presents with  . DOE  . Hyperlipidemia  . Follow-up    5 month    64 y/o Caucasian female  with hypertension, hyperlipidemia, OSA, PSVT, anxiety, depression.  Patient had ablation attempted by Dr. Cristopher Peru on 01/01/2016 that was unsuccessful. 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 Glennon Mac on 09/29/2017 that was successful. Patient was last seen in our office by Binnie Kand, [place] in 07/2019.  Patient has had exertional dyspnea for a while.  Cardiac work-up showed no ischemia on stress test.  Echocardiogram showed greater diastolic dysfunction.  Patient was also seen by pulmonology.  Minor PFT abnormalities were attributed to obesity.  Her cough has improved with regimen with PPI.  However, exertional dyspnea persists.  In addition, her losartan hydrochlorothiazide was changed to Bystolic by her PCP Dr. Danne Baxter suspecting that to be the reason for her cough.  However, since switching to Bystolic, her heart rate is in 40s.  She is concerned about this.  Blood pressure is well controlled.   Current Outpatient Medications on File Prior to Visit  Medication Sig Dispense Refill  . albuterol (VENTOLIN HFA) 108 (90 Base) MCG/ACT inhaler Inhale 1-2 puffs into the lungs every 6 (six) hours as needed for wheezing or shortness of breath. 18 g 1  . aspirin 81 MG chewable tablet Chew 1 tablet by mouth daily.    Marland Kitchen atorvastatin (LIPITOR) 10 MG tablet Take 1 tablet (10 mg total) by mouth daily. 90 tablet 3  . Calcium Carbonate-Vitamin D 600-400 MG-UNIT chew tablet Chew 1 tablet by mouth daily.     . fluticasone (FLONASE) 50 MCG/ACT nasal spray Place 1 spray into both nostrils daily as needed for allergies.     Marland Kitchen ibuprofen (ADVIL) 600 MG tablet Take 600 mg by mouth every 6 (six) hours as needed.    . nebivolol (BYSTOLIC) 5  MG tablet Take 5 mg by mouth daily.    Marland Kitchen olmesartan-hydrochlorothiazide (BENICAR HCT) 40-25 MG tablet Take 1 tablet by mouth daily.    Marland Kitchen omeprazole (PRILOSEC) 20 MG capsule Take 2 capsules (40 mg total) by mouth 2 (two) times daily before a meal. 60 capsule 1   No current facility-administered medications on file prior to visit.    Cardiovascular & other pertient studies:  EKG 01/19/2020: Sinus rhythm 56 bpm First degree A-V block   Lexiscan Sestamibi stress test 08/16/2019: No previous exam available for comparison. Lexiscan/modified Bruce nuclear stress test performed using 1-day protocol. Myocardial perfusion imaging is normal. Stress LVEF 72%.  Echocardiogram 08/02/2019:  Mild concentric hypertrophy of the left ventricle. Left ventricle cavity  is normal in size. Normal global wall motion. Normal LV systolic function  with EF 62%. Doppler evidence of grade II (pseudonormal) diastolic  dysfunction, indeterminate LAP. Calculated EF 62%.  Probably trileaflet aortic valve with mild calcification. Trace aortic  stenosis. Trace aortic regurgitation.  Trace mitral regurgitation, trace tricuspid regurgitation.  Inadequate TR jet to estimate pulmonary artery systolic pressure.  Estimated RA pressure 8 mmHg.  No significant change compared to previous study on 08/29/2017.  Recent labs: 12/02/2019: Glucose 101, BUN/Cr 10/1.0. EGFR 56.  Chol 156, TG 91, HDL 51, LDL 87 TSH 1.8 normal    Review of Systems  Cardiovascular: Positive for dyspnea on exertion. Negative for chest  pain, leg swelling, palpitations and syncope.       Bradycardia         Vitals:   01/19/20 1437  BP: 124/64  Pulse: (!) 50  SpO2: 97%    Body mass index is 31.67 kg/m. Filed Weights   01/19/20 1437  Weight: 190 lb 4.8 oz (86.3 kg)     Objective:   Physical Exam Vitals and nursing note reviewed.  Constitutional:      General: She is not in acute distress. Neck:     Vascular: No JVD.   Cardiovascular:     Rate and Rhythm: Normal rate and regular rhythm.     Pulses: Intact distal pulses.     Heart sounds: Normal heart sounds. No murmur heard.   Pulmonary:     Effort: Pulmonary effort is normal.     Breath sounds: Normal breath sounds. No wheezing or rales.  Musculoskeletal:     Right lower leg: Edema (Trace) present.     Left lower leg: Edema (Trace) present.         Assessment & Recommendations:   64 y/o Caucasian female  with hypertension, hyperlipidemia, OSA, PSVT, grade II diastolic dysfunction, exertional dyspnea  Exertional dyspnea: Grade 2 diastolic dysfunction could be contributing, in relation to obesity.  Extreme bradycardia can further worsen diastolic dysfunction.  I will stop Bystolic and start her on spironolactone 25 mg daily.  Recheck BMP in 1 week.   No primary indication for aspirin.  Okay to stop.  Continue follow-up with PCP.  I will see her back in 6 months.    Nigel Mormon, MD [facility]. [place] Pager: [phone] Office: [phone]

## 2020-01-20 DIAGNOSIS — F331 Major depressive disorder, recurrent, moderate: Secondary | ICD-10-CM | POA: Diagnosis not present

## 2020-01-20 DIAGNOSIS — F411 Generalized anxiety disorder: Secondary | ICD-10-CM | POA: Diagnosis not present

## 2020-01-20 DIAGNOSIS — F900 Attention-deficit hyperactivity disorder, predominantly inattentive type: Secondary | ICD-10-CM | POA: Diagnosis not present

## 2020-01-25 ENCOUNTER — Ambulatory Visit: Payer: BC Managed Care – PPO | Admitting: Cardiology

## 2020-01-25 DIAGNOSIS — I10 Essential (primary) hypertension: Secondary | ICD-10-CM | POA: Diagnosis not present

## 2020-01-25 DIAGNOSIS — E785 Hyperlipidemia, unspecified: Secondary | ICD-10-CM | POA: Diagnosis not present

## 2020-01-25 DIAGNOSIS — E038 Other specified hypothyroidism: Secondary | ICD-10-CM | POA: Diagnosis not present

## 2020-01-25 DIAGNOSIS — B372 Candidiasis of skin and nail: Secondary | ICD-10-CM | POA: Diagnosis not present

## 2020-01-26 ENCOUNTER — Encounter: Payer: Self-pay | Admitting: Registered"

## 2020-01-26 ENCOUNTER — Encounter: Payer: BC Managed Care – PPO | Attending: Pulmonary Disease | Admitting: Registered"

## 2020-01-26 ENCOUNTER — Other Ambulatory Visit: Payer: Self-pay

## 2020-01-26 DIAGNOSIS — E669 Obesity, unspecified: Secondary | ICD-10-CM | POA: Insufficient documentation

## 2020-01-26 NOTE — Progress Notes (Signed)
Medical Nutrition Therapy:  Appt start time: 1410 end time:  1520.  Assessment:  Primary concerns today: Pt referred due to weight management. Pt present for appointment alone.   Reports she had covid last year and has lost her sense of taste and smell. Reports this initially happening and then regaining and then losing taste and small again 2 months ago. Reports no foods taste correct.   Pt reports she tried over past several months to lose wt without seeing results. Pt reports trying to cut back on snacks, walking more each day, and being more concisous of what she was eating but could not lose any weight. Reports one of her dosage of amlodipine was reduced and she lost 10 lb. Reports it was causing fluid retention in her ankles.   Pt reports she is here for weight loss. Pt would also likes some recipes for healthy foods.  Pt reports she was having constant cough and was referred to GI and was given omeprazole and reports cough improved afterward. Reports having 2-3 urgent bowel movements (diarrhea) every morning. Reports occurs whether or not she eats something. Reports it will improve and then gets worse. Pt reports some have thought it was stress related but pt feels maybe it is related to Lipitor. Reports she has always had frequent bowel movements and was dx with IBS in the past. Reports she was taken medication for anxiety and seeing a therapist but stopped last year after the start of covid. Reports she was doing well for a while but feels now stress level is back up. Reports worsening of frequent loose stools occurred during this time she was not in therapy or taking medication for anxiety. Denies pain, nausea, or vomiting. She tried increasing fiber intake and reports diarrhea became worse. Pt has now started back on anxiety medication and is waiting for therapist to call to set up therapy appointment.  Pt reports she tried food tracking in past with [employer] but it did not work well for  her because she has OCD tendencies and became really stressed and focused too much on the foods.   Reports low energy level, but denies dizziness or headaches. Pt reports high stress level.   Food Allergies/Intolerances: None reported.   GI Concerns: loose stools 3-4 times every morning. Hx of IBS.   Pertinent Lab Values: N/A  Preferred Learning Style:   No preference indicated   Learning Readiness:   Ready  MEDICATIONS: Reviewed. See list.    DIETARY INTAKE:  Usual eating pattern includes 3 meals and sometimes some snacks.   Common foods: pizza.  Avoided foods: yogurt, eggs.    Typical Snacks:  Fritos, Cheets, cookies, popcorn-varies depending on what husband buys at the store.   Typical Beverages: ~ 2 bottles water, Sprite.   Location of Meals: together. In kitchen.    Electronics Present at [employer]: sometimes phone, TV.   24-hr recall:  B ( AM): raisin bread toast x 2 with butter, orange juice  Snk ( AM): None reported.  L ( PM): cheese sandwich on white bread, water  Snk (4 PM): Cook Out: pineapple milkshake, onion rings D ( PM): None reported.  Snk ( PM): pretzels, orange juice, water   Beverages: orange juice, water   Usual physical activity: lawn dancing Minutes/Week: 2 times per week x ~1 hour  Progress Towards Goal(s):  In progress.   Nutritional Diagnosis:  NI-5.11.1 Predicted suboptimal nutrient intake As related to unbalanced meals, inadequate water intake, frequent diarrhea .  As evidenced by pt's reported dietary recall and habits.    Intervention:  Nutrition counseling provided. Dietitian provided education for balanced nutrition for diarrhea/IBS. Discussed would recommend working to help reduce diarrhea before focusing on other eating habits. Discussed following a low fiber diet for 2 weeks to see if this helps to reduce diarrhea while also working to have a calm eating environment (turning off news/TV while eating) and eating more but smaller  meals. Encouraged pt to schedule appointment with GI to discuss issue with frequent diarrhea. Discussed how stress can play a large role in IBS. Recommended working in more water: 3-4 bottles daily. Discussed keeping GI symptom journal and also including what was last eaten and stress level from 1-10 with 5 being typical level of stress as pt reports having high level stress typically now. Asked pt to email in 2 weeks regarding how GI symptoms are with low fiber diet. Pt appeared agreeable to information/goals discussed.    Instructions/Goals:  Recommend eating 4-6 small meals/snacks per day   Eat in a calm environment: turn off TV, continue working to eat at a slower pace.   Makes notes regarding how you felt around time you have a GI issue, score stress level from 1-10.    Recommend lower fiber foods (see list) to see if this helps with diarrhea. Try this for 2 weeks and make notes of how often you have diarrhea.   Water Goal: at least 3 bottles per day.   Recipes:  [email or web address]  Teaching Method Utilized:  Visual Auditory  Handouts given during visit include:  Balanced plate and food list.   MNT for Diarrhea   Barriers to learning/adherence to lifestyle change: high level of stress; frequent diarrhea.   Demonstrated degree of understanding via:  Teach Back   Monitoring/Evaluation:  Dietary intake, exercise, and body weight in 4 week(s).

## 2020-01-26 NOTE — Patient Instructions (Addendum)
Instructions/Goals:  Recommend eating 4-6 small meals/snacks per day   Eat in a calm environment: turn off TV, continue working to eat at a slower pace.   Makes notes regarding how you felt around time you have a GI issue, score stress level from 1-10.    Recommend lower fiber foods (see list) to see if this helps with diarrhea. Try this for 2 weeks and make notes of how often you have diarrhea.   Water Goal: at least 3 bottles per day.   Recipes:  [email or web address]

## 2020-01-27 DIAGNOSIS — E038 Other specified hypothyroidism: Secondary | ICD-10-CM | POA: Diagnosis not present

## 2020-01-27 DIAGNOSIS — I10 Essential (primary) hypertension: Secondary | ICD-10-CM | POA: Diagnosis not present

## 2020-02-10 DIAGNOSIS — F39 Unspecified mood [affective] disorder: Secondary | ICD-10-CM | POA: Diagnosis not present

## 2020-02-22 DIAGNOSIS — F411 Generalized anxiety disorder: Secondary | ICD-10-CM | POA: Diagnosis not present

## 2020-02-22 DIAGNOSIS — F331 Major depressive disorder, recurrent, moderate: Secondary | ICD-10-CM | POA: Diagnosis not present

## 2020-03-02 ENCOUNTER — Ambulatory Visit: Payer: BC Managed Care – PPO | Admitting: Registered"

## 2020-03-30 DIAGNOSIS — F39 Unspecified mood [affective] disorder: Secondary | ICD-10-CM | POA: Diagnosis not present

## 2020-04-16 ENCOUNTER — Other Ambulatory Visit: Payer: Self-pay | Admitting: Cardiology

## 2020-04-16 DIAGNOSIS — R0600 Dyspnea, unspecified: Secondary | ICD-10-CM

## 2020-04-16 DIAGNOSIS — I10 Essential (primary) hypertension: Secondary | ICD-10-CM

## 2020-04-16 DIAGNOSIS — R0609 Other forms of dyspnea: Secondary | ICD-10-CM

## 2020-04-26 DIAGNOSIS — F411 Generalized anxiety disorder: Secondary | ICD-10-CM | POA: Diagnosis not present

## 2020-04-26 DIAGNOSIS — F331 Major depressive disorder, recurrent, moderate: Secondary | ICD-10-CM | POA: Diagnosis not present

## 2020-05-17 DIAGNOSIS — M79674 Pain in right toe(s): Secondary | ICD-10-CM | POA: Diagnosis not present

## 2020-05-18 DIAGNOSIS — F39 Unspecified mood [affective] disorder: Secondary | ICD-10-CM | POA: Diagnosis not present

## 2020-06-06 DIAGNOSIS — I10 Essential (primary) hypertension: Secondary | ICD-10-CM | POA: Diagnosis not present

## 2020-06-06 DIAGNOSIS — E785 Hyperlipidemia, unspecified: Secondary | ICD-10-CM | POA: Diagnosis not present

## 2020-07-06 DIAGNOSIS — F331 Major depressive disorder, recurrent, moderate: Secondary | ICD-10-CM | POA: Diagnosis not present

## 2020-07-06 DIAGNOSIS — F411 Generalized anxiety disorder: Secondary | ICD-10-CM | POA: Diagnosis not present

## 2020-07-12 ENCOUNTER — Ambulatory Visit: Payer: BC Managed Care – PPO | Admitting: Cardiology

## 2020-07-17 ENCOUNTER — Ambulatory Visit: Payer: BC Managed Care – PPO | Admitting: Cardiology

## 2020-07-29 DIAGNOSIS — K635 Polyp of colon: Secondary | ICD-10-CM

## 2020-07-29 HISTORY — DX: Polyp of colon: K63.5

## 2020-08-09 ENCOUNTER — Other Ambulatory Visit: Payer: Self-pay | Admitting: Cardiology

## 2020-09-21 ENCOUNTER — Other Ambulatory Visit: Payer: Self-pay | Admitting: Cardiology

## 2020-09-21 ENCOUNTER — Ambulatory Visit: Payer: 59 | Admitting: Cardiology

## 2020-09-21 DIAGNOSIS — R0609 Other forms of dyspnea: Secondary | ICD-10-CM

## 2020-09-21 DIAGNOSIS — I10 Essential (primary) hypertension: Secondary | ICD-10-CM

## 2020-09-21 DIAGNOSIS — R0600 Dyspnea, unspecified: Secondary | ICD-10-CM

## 2020-10-12 ENCOUNTER — Ambulatory Visit: Payer: 59 | Admitting: Cardiology

## 2020-10-12 ENCOUNTER — Encounter: Payer: Self-pay | Admitting: Cardiology

## 2020-10-12 ENCOUNTER — Other Ambulatory Visit: Payer: Self-pay

## 2020-10-12 VITALS — BP 104/60 | HR 84 | Temp 98.2°F | Ht 65.0 in | Wt 184.0 lb

## 2020-10-12 DIAGNOSIS — Z1231 Encounter for screening mammogram for malignant neoplasm of breast: Secondary | ICD-10-CM

## 2020-10-12 DIAGNOSIS — I5032 Chronic diastolic (congestive) heart failure: Secondary | ICD-10-CM

## 2020-10-12 DIAGNOSIS — I10 Essential (primary) hypertension: Secondary | ICD-10-CM

## 2020-10-12 DIAGNOSIS — R0609 Other forms of dyspnea: Secondary | ICD-10-CM

## 2020-10-12 DIAGNOSIS — E782 Mixed hyperlipidemia: Secondary | ICD-10-CM

## 2020-10-12 DIAGNOSIS — R0600 Dyspnea, unspecified: Secondary | ICD-10-CM

## 2020-10-12 DIAGNOSIS — I471 Supraventricular tachycardia: Secondary | ICD-10-CM

## 2020-10-12 MED ORDER — METOPROLOL TARTRATE 25 MG PO TABS
25.0000 mg | ORAL_TABLET | Freq: Two times a day (BID) | ORAL | 1 refills | Status: DC
Start: 2020-10-12 — End: 2020-12-06

## 2020-10-12 NOTE — Patient Instructions (Signed)
Your cardiac CT will be scheduled at the locations below:   [facility]  [address]  [place], [place] [address]  [phone]    If scheduled at [facility], please arrive at the [facility] main entrance of [facility] 30-45 minutes prior to test start time.  Proceed to the [facility] Radiology Department (first floor) to check-in and test prep.   Please follow these instructions carefully (unless otherwise directed):    On the Night Before the Test:   Be sure to Drink plenty of water.   Do not consume any caffeinated/decaffeinated beverages or chocolate 12 hours prior to your test.   Do not take any antihistamines 12 hours prior to your test.   If the patient has contrast allergy:  Patient will need a prescription for Prednisone and very clear instructions (as follows):  1. Prednisone 50 mg - take 13 hours prior to test  2. Take another Prednisone 50 mg 7 hours prior to test  3. Take another Prednisone 50 mg 1 hour prior to test  4. Take Benadryl 50 mg 1 hour prior to test   Patient must complete all four doses of above prophylactic medications.   Patient will need a ride after test due to Benadryl.   On the Day of the Test:   Drink plenty of water. Do not drink any water within one hour of the test.   Do not eat any food 4 hours prior to the test.   You may take your regular medications prior to the test.    - Metoprolol tartarate 25 mg starting 2 days before the CT scan Take a dose 2 hours before CT scan You may stop it after the CT scan, unless specified otherwise by me.    FEMALES- please wear underwire-free bra if available          After the Test:   Drink plenty of water.   After receiving IV contrast, you may experience a mild flushed feeling. This is normal.   On occasion, you may experience a mild rash up to 24 hours after the test. This is not dangerous. If this occurs, you can take Benadryl 25 mg and increase  your fluid intake.   If you experience trouble breathing, this can be serious. If it is severe call 911 IMMEDIATELY. If it is mild, please call our office.   If you take any of these medications: Glipizide/Metformin, Avandament, Glucavance, please do not take 48 hours after completing test unless otherwise instructed.     Please contact the cardiac imaging nurse navigator should you have any questions/concerns  Marchia Bond, RN Navigator Cardiac Imaging  [facility]  [phone] Office  [phone] Cell

## 2020-10-12 NOTE — Progress Notes (Signed)
Follow up visit  Subjective:   [name], female    DOB: 1956-06-28, 65 y.o.   MRN: [id number]   HPI   Chief Complaint  Patient presents with  . Hypertension  . Shortness of Breath  . SWEATING    65 y/o Caucasian female  with hypertension, hyperlipidemia, OSA, h/o AVNRT ablation, anxiety, depression, exertional dyspnea  Patient has continued to have exertional dyspnea symptoms They are lifestyle limiting, making dancing-something she loves-very difficult. She denies any chest pain. Previous workup with a nuclear stress test, echocardiogram has been unremarkable-except grade II diastolic dysfunction. No significant abnormalities were found on pulmonary workup either.    Current Outpatient Medications on File Prior to Visit  Medication Sig Dispense Refill  . atorvastatin (LIPITOR) 10 MG tablet TAKE 1 TABLET BY MOUTH EVERY DAY 90 tablet 3  . cetirizine (ZYRTEC) 10 MG tablet Take 10 mg by mouth daily.    . fluticasone (FLONASE) 50 MCG/ACT nasal spray Place 1 spray into both nostrils daily as needed for allergies.     Marland Kitchen ibuprofen (ADVIL) 600 MG tablet Take 600 mg by mouth every 6 (six) hours as needed.    Marland Kitchen olmesartan-hydrochlorothiazide (BENICAR HCT) 40-25 MG tablet Take 1 tablet by mouth daily.    Marland Kitchen omeprazole (PRILOSEC) 20 MG capsule Take 2 capsules (40 mg total) by mouth 2 (two) times daily before a meal. 60 capsule 1  . spironolactone (ALDACTONE) 25 MG tablet TAKE 1 TABLET BY MOUTH EVERY DAY 90 tablet 1  . STRATTERA 40 MG capsule Take 40 mg by mouth daily.    Marland Kitchen vortioxetine HBr (TRINTELLIX) 20 MG TABS tablet Take 10 mg by mouth daily.     No current facility-administered medications on file prior to visit.    Cardiovascular & other pertient studies:  EKG 10/12/2020: Sinus rhythm 82 bpm Occasional PAC      Lexiscan Sestamibi stress test 08/16/2019: No previous exam available for comparison. Lexiscan/modified Bruce nuclear stress test performed using 1-day  protocol. Myocardial perfusion imaging is normal. Stress LVEF 72%.  Echocardiogram 08/02/2019:  Mild concentric hypertrophy of the left ventricle. Left ventricle cavity  is normal in size. Normal global wall motion. Normal LV systolic function  with EF 62%. Doppler evidence of grade II (pseudonormal) diastolic  dysfunction, indeterminate LAP. Calculated EF 62%.  Probably trileaflet aortic valve with mild calcification. Trace aortic  stenosis. Trace aortic regurgitation.  Trace mitral regurgitation, trace tricuspid regurgitation.  Inadequate TR jet to estimate pulmonary artery systolic pressure.  Estimated RA pressure 8 mmHg.  No significant change compared to previous study on 08/29/2017.  Recent labs: 12/02/2019: Glucose 101, BUN/Cr 10/1.0. EGFR 56.  Chol 156, TG 91, HDL 51, LDL 87 TSH 1.8 normal    Review of Systems  Cardiovascular: Positive for dyspnea on exertion. Negative for chest pain, leg swelling, palpitations and syncope.      Vitals:   10/12/20 1151  BP: 104/60  Pulse: 84  Temp: 98.2 F (36.8 C)  SpO2: 99%     Body mass index is 30.62 kg/m. Filed Weights   10/12/20 1151  Weight: 184 lb (83.5 kg)     Objective:   Physical Exam Vitals and nursing note reviewed.  Constitutional:      General: She is not in acute distress. Neck:     Vascular: No JVD.  Cardiovascular:     Rate and Rhythm: Normal rate and regular rhythm.     Pulses: Intact distal pulses.     Heart sounds:  Normal heart sounds. No murmur heard.   Pulmonary:     Effort: Pulmonary effort is normal.     Breath sounds: Normal breath sounds. No wheezing or rales.  Musculoskeletal:     Right lower leg: Edema (Trace) present.     Left lower leg: Edema (Trace) present.         Assessment & Recommendations:   65 y/o Caucasian female  with hypertension, hyperlipidemia, OSA, PSVT, grade II diastolic dysfunction, exertional dyspnea  Exertional dyspnea: Likely multifactorial-out of  proportion to echocardiogram findings. While stress test has been negative in the past, I recommend definitive coronary anatomy evaluation with coronary CTA to r/o obstructive CAD.   Hypertension: Controlled. Tolerating valsartan-HCTZ and spironolactone without any significant renal dysfunction.  F/u after test  Nigel Mormon, MD [facility]. [place] Pager: [phone] Office: [phone]

## 2020-10-13 ENCOUNTER — Encounter: Payer: Self-pay | Admitting: Cardiology

## 2020-10-19 ENCOUNTER — Other Ambulatory Visit: Payer: 59

## 2020-10-30 ENCOUNTER — Ambulatory Visit: Payer: 59

## 2020-10-30 ENCOUNTER — Other Ambulatory Visit: Payer: Self-pay

## 2020-10-30 DIAGNOSIS — R0600 Dyspnea, unspecified: Secondary | ICD-10-CM

## 2020-10-30 DIAGNOSIS — R0609 Other forms of dyspnea: Secondary | ICD-10-CM

## 2020-10-31 LAB — LIPID PANEL
Chol/HDL Ratio: 3.1 ratio (ref 0.0–4.4)
Cholesterol, Total: 174 mg/dL (ref 100–199)
HDL: 57 mg/dL (ref >39)
LDL Chol Calc (NIH): 100 mg/dL — ABNORMAL HIGH (ref 0–99)
Triglycerides: 93 mg/dL (ref 0–149)
VLDL Cholesterol Cal: 17 mg/dL (ref 5–40)

## 2020-10-31 LAB — BASIC METABOLIC PANEL
BUN/Creatinine Ratio: 17 (ref 12–28)
BUN: 22 mg/dL (ref 8–27)
CO2: 23 mmol/L (ref 20–29)
Calcium: 9.7 mg/dL (ref 8.7–10.3)
Chloride: 102 mmol/L (ref 96–106)
Creatinine, Ser: 1.3 mg/dL — ABNORMAL HIGH (ref 0.57–1.00)
Glucose: 89 mg/dL (ref 65–99)
Potassium: 4.6 mmol/L (ref 3.5–5.2)
Sodium: 142 mmol/L (ref 134–144)
eGFR: 46 mL/min/{1.73_m2} — ABNORMAL LOW (ref >59)

## 2020-10-31 LAB — TSH: TSH: 2.07 u[IU]/mL (ref 0.450–4.500)

## 2020-11-02 ENCOUNTER — Other Ambulatory Visit: Payer: Self-pay | Admitting: Internal Medicine

## 2020-11-02 DIAGNOSIS — Z1231 Encounter for screening mammogram for malignant neoplasm of breast: Secondary | ICD-10-CM

## 2020-11-02 NOTE — Addendum Note (Signed)
Addended by: Nigel Mormon on: 11/02/2020 02:42 PM   Modules accepted: Orders

## 2020-11-02 NOTE — Addendum Note (Signed)
Addended by: Nigel Mormon on: 11/02/2020 02:43 PM   Modules accepted: Orders

## 2020-11-06 ENCOUNTER — Ambulatory Visit: Payer: 59

## 2020-11-06 ENCOUNTER — Other Ambulatory Visit: Payer: Self-pay

## 2020-11-06 DIAGNOSIS — R0600 Dyspnea, unspecified: Secondary | ICD-10-CM

## 2020-11-06 DIAGNOSIS — R0609 Other forms of dyspnea: Secondary | ICD-10-CM

## 2020-11-10 LAB — BASIC METABOLIC PANEL
BUN/Creatinine Ratio: 20 (ref 12–28)
BUN: 21 mg/dL (ref 8–27)
CO2: 23 mmol/L (ref 20–29)
Calcium: 9.7 mg/dL (ref 8.7–10.3)
Chloride: 99 mmol/L (ref 96–106)
Creatinine, Ser: 1.06 mg/dL — ABNORMAL HIGH (ref 0.57–1.00)
Glucose: 90 mg/dL (ref 65–99)
Potassium: 4.4 mmol/L (ref 3.5–5.2)
Sodium: 138 mmol/L (ref 134–144)
eGFR: 59 mL/min/{1.73_m2} — ABNORMAL LOW (ref >59)

## 2020-11-13 ENCOUNTER — Ambulatory Visit: Payer: 59 | Admitting: Cardiology

## 2020-11-14 ENCOUNTER — Other Ambulatory Visit (HOSPITAL_COMMUNITY): Payer: Self-pay

## 2020-11-20 ENCOUNTER — Ambulatory Visit
Admission: RE | Admit: 2020-11-20 | Discharge: 2020-11-20 | Disposition: A | Discharge status: Home / Self Care | Payer: BC Managed Care – PPO | Source: Ambulatory Visit | Attending: Cardiology | Admitting: Cardiology

## 2020-11-20 DIAGNOSIS — R0609 Other forms of dyspnea: Secondary | ICD-10-CM

## 2020-11-20 DIAGNOSIS — R0600 Dyspnea, unspecified: Secondary | ICD-10-CM

## 2020-12-06 ENCOUNTER — Ambulatory Visit: Payer: 59 | Admitting: Cardiology

## 2020-12-06 ENCOUNTER — Encounter: Payer: Self-pay | Admitting: Cardiology

## 2020-12-06 ENCOUNTER — Other Ambulatory Visit: Payer: Self-pay

## 2020-12-06 VITALS — BP 133/71 | HR 66 | Temp 98.0°F | Resp 16 | Ht 65.0 in | Wt 192.0 lb

## 2020-12-06 DIAGNOSIS — I10 Essential (primary) hypertension: Secondary | ICD-10-CM

## 2020-12-06 DIAGNOSIS — R0609 Other forms of dyspnea: Secondary | ICD-10-CM

## 2020-12-06 DIAGNOSIS — R931 Abnormal findings on diagnostic imaging of heart and coronary circulation: Secondary | ICD-10-CM | POA: Insufficient documentation

## 2020-12-06 DIAGNOSIS — E782 Mixed hyperlipidemia: Secondary | ICD-10-CM

## 2020-12-06 DIAGNOSIS — R0600 Dyspnea, unspecified: Secondary | ICD-10-CM

## 2020-12-06 MED ORDER — SPIRONOLACTONE 25 MG PO TABS: 25.0000 mg | ORAL_TABLET | Freq: Every day | ORAL | 3 refills | Status: DC

## 2020-12-06 MED ORDER — ENTRESTO 49-51 MG PO TABS: 1.0000 | ORAL_TABLET | Freq: Two times a day (BID) | ORAL | 1 refills | Status: DC

## 2020-12-06 NOTE — Progress Notes (Signed)
Follow up visit  Subjective:   [name], female    DOB: 12/25/1955, 65 y.o.   MRN: [id number]   HPI   Chief Complaint  Patient presents with  . Shortness of Breath  . PSVT   . Hypertension  . Follow-up    Gain 8-9lb in a month  . Results    Echo, labs CTA    65 y/o Caucasian female  with hypertension, hyperlipidemia, OSA, h/o AVNRT ablation, anxiety, depression, exertional dyspnea  Patient has continued to have exertional dyspnea symptoms They are lifestyle limiting, making dancing-something she loves-very difficult.  Patient underwent nuclear stress testing, echocardiogram, and CT cardiac scoring, details below.  Patient continues to have exertional dyspnea with usual activity.  Since stopping spironolactone, she has gained 14 pounds.  She also has noted swelling in the legs, and "burning sensation in her toes".  She denies any chest pain.   Current Outpatient Medications on File Prior to Visit  Medication Sig Dispense Refill  . atorvastatin (LIPITOR) 10 MG tablet TAKE 1 TABLET BY MOUTH EVERY DAY 90 tablet 3  . cetirizine (ZYRTEC) 10 MG tablet Take 10 mg by mouth daily.    . fluticasone (FLONASE) 50 MCG/ACT nasal spray Place 1 spray into both nostrils daily as needed for allergies.     Marland Kitchen olmesartan-hydrochlorothiazide (BENICAR HCT) 40-25 MG tablet Take 1 tablet by mouth daily.    Marland Kitchen vortioxetine HBr (TRINTELLIX) 20 MG TABS tablet Take 10 mg by mouth daily.     No current facility-administered medications on file prior to visit.    Cardiovascular & other pertient studies:  CT Cardiac scoring 11/20/2020: 1. Coronary artery calcium score of 141. This places the patient in the 88th percentile for patients of the same race, age and gender. LM: 15 LAD: 126 LCx: 0 RCA: 0 Total Agatston Score: 141 2. Numerous pulmonary nodules many with indistinct characteristics. Some nodule seen on previous imaging. Tiny size most of these nodules and the indistinct nature would  favor an infectious or inflammatory process though findings are nonspecific. Correlate with any history of neoplasm given that there is some random distribution and consider short interval follow-up if no history of neoplasm at 3 month interval. 3. Marked enlargement of the main pulmonary artery compatible with pulmonary arterial hypertension (4.7 cm). 4. Patulous esophagus with mild thickening distally. Correlate with any signs of esophagitis and or reflux with dedicated esophageal evaluation as warranted. 5. Mild aortic dilation without aneurysmal caliber.  6. Aortic atherosclerosis.  Exercise Sestamibi stress test 11/06/2020: Exercise nuclear stress test was performed using Bruce protocol. Patient reached 7.8 METS, and 83% of age predicted maximum heart rate. Exercise capacity was fair. No chest pain reported. Heart rate and hemodynamic response were normal. Stress EKG revealed no ischemic changes. SPECT images show very small sized, mild intensity, partially reversible perfusion defect in basal inferolateral myocardium. Stress LVEF 65%. Low risk study.   Echocardiogram 10/30/2020:  Left ventricle cavity is normal in size. Mild concentric hypertrophy of  the left ventricle. Normal global wall motion. Normal LV systolic function  with EF 60%. Doppler evidence of grade II (pseudonormal) diastolic  dysfunction, elevated LAP.  Mild (Grade I) aortic regurgitation.  Mild tricuspid regurgitation.  No evidence of pulmonary hypertension.  No significant change compared to previous study on 08/02/2019.   EKG 10/12/2020: Sinus rhythm 82 bpm Occasional PAC      Lexiscan Sestamibi stress test 08/16/2019: No previous exam available for comparison. Lexiscan/modified Bruce nuclear stress test performed  using 1-day protocol. Myocardial perfusion imaging is normal. Stress LVEF 72%.  Echocardiogram 08/02/2019:  Mild concentric hypertrophy of the left ventricle. Left ventricle cavity  is normal in  size. Normal global wall motion. Normal LV systolic function  with EF 62%. Doppler evidence of grade II (pseudonormal) diastolic  dysfunction, indeterminate LAP. Calculated EF 62%.  Probably trileaflet aortic valve with mild calcification. Trace aortic  stenosis. Trace aortic regurgitation.  Trace mitral regurgitation, trace tricuspid regurgitation.  Inadequate TR jet to estimate pulmonary artery systolic pressure.  Estimated RA pressure 8 mmHg.  No significant change compared to previous study on 08/29/2017.  Recent labs: 12/02/2019: Glucose 101, BUN/Cr 10/1.0. EGFR 56.  Chol 156, TG 91, HDL 51, LDL 87 TSH 1.8 normal    Review of Systems  Cardiovascular: Positive for dyspnea on exertion. Negative for chest pain, leg swelling, palpitations and syncope.      Vitals:   12/06/20 1007  BP: 133/71  Pulse: 66  Resp: 16  Temp: 98 F (36.7 C)  SpO2: 96%     Body mass index is 31.95 kg/m. Filed Weights   12/06/20 1007  Weight: 192 lb (87.1 kg)     Objective:   Physical Exam Vitals and nursing note reviewed.  Constitutional:      General: She is not in acute distress. Neck:     Vascular: No JVD.  Cardiovascular:     Rate and Rhythm: Normal rate and regular rhythm.     Pulses: Intact distal pulses.     Heart sounds: Normal heart sounds. No murmur heard.   Pulmonary:     Effort: Pulmonary effort is normal.     Breath sounds: Normal breath sounds. No wheezing or rales.  Musculoskeletal:     Right lower leg: Edema (Trace) present.     Left lower leg: Edema (Trace) present.         Assessment & Recommendations:   65 y/o Caucasian female  with hypertension, hyperlipidemia, OSA, PSVT, grade II diastolic dysfunction, exertional dyspnea  Exertional dyspnea: Significant findings that could be contributing to exertional dyspnea include, grade 2 diastolic dysfunction with elevated left atrial pressure, CT cardiac scoring showing left main and LAD calcification in spite of  negative nuclear stress testing, multiple pulmonary nodules, and more importantly-dilated pulmonary artery at 4.7 craising suspicion of pulmonary hypertension.   I think her primary etiology for exertional dyspnea is heart failure with preserved ejection fraction with grade 2 diastolic dysfunction, thereby causing pulmonary venous hypertension.  Of course, definitive diagnosis will only be on left and right heart catheterization.  Patient wants to hold this off at this time.  I think this is reasonable.  I will start her back on spironolactone 25 mg daily.  In the past, she did have increase in creatinine to 1.3 while on olmesartan-HCTZ, and spironolactone.  Therefore, I have stopped her olmesartan-HCTZ and started on Entresto 49-51-51 mg twice daily for treatment of possible HFpEF.  I will obtain labs today and repeat them in 7-10 days, including BMP and NT proBNP.  Encourage patient to continue monitoring blood pressure, weights and follow low-salt diet.  In addition, I will forward her recent CT scan results to Dr. Clide Deutscher to get his input regarding the pulmonary nodules.  Hypertension: Controlled.  Changes made as above.    F/u in 2-3 weeks  Nigel Mormon, MD [facility] Pager: [phone] Office: [phone]

## 2020-12-07 LAB — BASIC METABOLIC PANEL
BUN/Creatinine Ratio: 15 (ref 12–28)
BUN: 15 mg/dL (ref 8–27)
CO2: 25 mmol/L (ref 20–29)
Calcium: 9.6 mg/dL (ref 8.7–10.3)
Chloride: 100 mmol/L (ref 96–106)
Creatinine, Ser: 0.99 mg/dL (ref 0.57–1.00)
Glucose: 91 mg/dL (ref 65–99)
Potassium: 4.2 mmol/L (ref 3.5–5.2)
Sodium: 139 mmol/L (ref 134–144)
eGFR: 64 mL/min/{1.73_m2} (ref >59)

## 2020-12-07 LAB — PRO B NATRIURETIC PEPTIDE: NT-Pro BNP: 82 pg/mL (ref 0–287)

## 2020-12-08 ENCOUNTER — Telehealth: Payer: Self-pay

## 2020-12-08 DIAGNOSIS — R0600 Dyspnea, unspecified: Secondary | ICD-10-CM

## 2020-12-08 DIAGNOSIS — R0609 Other forms of dyspnea: Secondary | ICD-10-CM

## 2020-12-08 DIAGNOSIS — R918 Other nonspecific abnormal finding of lung field: Secondary | ICD-10-CM

## 2020-12-08 NOTE — Telephone Encounter (Signed)
Next Appt With Pulmonology (Rand[place], DO) 12/27/2020 at 10:45 AM  CT order has been placed

## 2020-12-08 NOTE — Telephone Encounter (Signed)
-----   Message from Garner Nash, DO sent at 12/07/2020  6:32 PM EDT ----- Yes please  Thanks Brad    ----- Message ----- From: Clovis Fredrickson, CMA Sent: 12/07/2020  11:24 AM EDT To: Garner Nash, DO  Are you ok with [name] putting her in one of your held nodule slots?  ----- Message ----- From: Garner Nash, DO Sent: 12/07/2020   8:29 AM EDT To: Elie Confer, CMA, [facility] Triage Pool   Please let patient know that I have reviewed her recent ct coronary completed by cardiology and she would benefit from a dedicated CT Chest wo contrast.   Please order this CT chest and have a appointment scheduled with me for follow-up to review images with patient   Thanks  Lance Muss, [facility] 12/07/2020 8:29 AM       ----- Message ----- From: Nigel Mormon, MD Sent: 12/06/2020  10:52 AM EDT To: Garner Nash, DO  Hey Dr. Valeta Harms,  Rel Our mutual patient. Please review her CT scan at your convenience.  Performed for CT cardiac scoring, incidental finding of pulmonary nodules.  Please opine.  Thanks [employer]

## 2020-12-08 NOTE — Telephone Encounter (Signed)
From pt

## 2020-12-15 ENCOUNTER — Other Ambulatory Visit: Payer: Self-pay

## 2020-12-15 ENCOUNTER — Ambulatory Visit (INDEPENDENT_AMBULATORY_CARE_PROVIDER_SITE_OTHER)
Admission: RE | Admit: 2020-12-15 | Discharge: 2020-12-15 | Disposition: A | Discharge status: Home / Self Care | Payer: 59 | Source: Ambulatory Visit | Attending: Pulmonary Disease | Admitting: Pulmonary Disease

## 2020-12-15 DIAGNOSIS — R0600 Dyspnea, unspecified: Secondary | ICD-10-CM

## 2020-12-15 DIAGNOSIS — R918 Other nonspecific abnormal finding of lung field: Secondary | ICD-10-CM

## 2020-12-15 DIAGNOSIS — R0609 Other forms of dyspnea: Secondary | ICD-10-CM

## 2020-12-16 LAB — BASIC METABOLIC PANEL
BUN/Creatinine Ratio: 16 (ref 12–28)
BUN: 16 mg/dL (ref 8–27)
CO2: 21 mmol/L (ref 20–29)
Calcium: 9.5 mg/dL (ref 8.7–10.3)
Chloride: 104 mmol/L (ref 96–106)
Creatinine, Ser: 1.03 mg/dL — ABNORMAL HIGH (ref 0.57–1.00)
Glucose: 107 mg/dL — ABNORMAL HIGH (ref 65–99)
Potassium: 4.4 mmol/L (ref 3.5–5.2)
Sodium: 139 mmol/L (ref 134–144)
eGFR: 61 mL/min/{1.73_m2} (ref >59)

## 2020-12-16 LAB — PRO B NATRIURETIC PEPTIDE: NT-Pro BNP: 86 pg/mL (ref 0–287)

## 2020-12-18 ENCOUNTER — Telehealth: Payer: Self-pay | Admitting: Pulmonary Disease

## 2020-12-18 NOTE — Telephone Encounter (Signed)
Call report on pt on ct chest 12/16/20  Impression:  IMPRESSION: 1. Multiple pulmonary nodules are again identified. Several of the nodules are stable. A few of the nodules are a little larger in the interval. This could be due to difference in slice selection. Recommend a 3 month follow-up CT scan to assess stability. 2. 3.9 solid nodule in the right thyroid lobe. Recommend thyroid US (ref: J Am Coll Radiol. 2015 Feb;12(2): 143-50). 3. Moderate hiatal hernia. 4. Mild prominence of the ascending thoracic aorta measuring 3.7 cm without aneurysmal dilatation. 5. Coronary artery disease as above. 6. The main pulmonary artery measures up to 5.1 cm today consistent with pulmonary arterial hypertension. The difference compared to November 21, 2018 2 May be due to difference in slice selection and measurement technique. 7. No other abnormalities.  These results will be called to the ordering clinician or representative by the Radiologist Assistant, and communication documented in the PACS or [employer].   Electronically Signed   By: Dorise Bullion III M.D   On: 12/16/2020 16:59

## 2020-12-18 NOTE — Telephone Encounter (Signed)
PCCM:  You can let patient know we got her report. I have reviewed and we will discuss in detail at next office visit.   Garner Nash, DO [facility] 12/18/2020 1:24 PM

## 2020-12-18 NOTE — Telephone Encounter (Signed)
Called and spoke with pt letting her know that BI did review recent CT report and that he will be going over scan in detail at upcoming appt. Pt verbalized understanding. Nothing further needed.

## 2020-12-22 ENCOUNTER — Other Ambulatory Visit: Payer: Self-pay

## 2020-12-22 ENCOUNTER — Ambulatory Visit
Admission: RE | Admit: 2020-12-22 | Discharge: 2020-12-22 | Disposition: A | Discharge status: Home / Self Care | Payer: 59 | Source: Ambulatory Visit | Attending: Internal Medicine | Admitting: Internal Medicine

## 2020-12-27 ENCOUNTER — Ambulatory Visit: Payer: 59 | Admitting: Pulmonary Disease

## 2020-12-27 ENCOUNTER — Encounter: Payer: Self-pay | Admitting: Pulmonary Disease

## 2020-12-27 ENCOUNTER — Other Ambulatory Visit: Payer: Self-pay

## 2020-12-27 VITALS — BP 134/82 | HR 76 | Temp 97.2°F | Ht 65.0 in | Wt 187.0 lb

## 2020-12-27 DIAGNOSIS — K449 Diaphragmatic hernia without obstruction or gangrene: Secondary | ICD-10-CM | POA: Diagnosis not present

## 2020-12-27 DIAGNOSIS — R918 Other nonspecific abnormal finding of lung field: Secondary | ICD-10-CM | POA: Diagnosis not present

## 2020-12-27 DIAGNOSIS — R059 Cough, unspecified: Secondary | ICD-10-CM | POA: Diagnosis not present

## 2020-12-27 DIAGNOSIS — Z8616 Personal history of COVID-19: Secondary | ICD-10-CM | POA: Diagnosis not present

## 2020-12-27 NOTE — Patient Instructions (Addendum)
Thank you for visiting Dr. Valeta Harms at [facility]. Today we recommend the following:  Orders Placed This Encounter  Procedures  . CT CHEST WO CONTRAST  . Ambulatory referral to Cardiothoracic Surgery   Referral placed to Dr. Kipp Brood to discuss hernia Follow up with Dr. Dagmar Hait scheduled  Return in about 1 year (around 12/27/2021) for with APP or Dr. Valeta Harms.      Please do your part to reduce the spread of COVID-19.

## 2020-12-27 NOTE — Progress Notes (Signed)
Synopsis: Referred in February 2021 for dyspnea on exertion by Prince Solian, MD  Subjective:   PATIENT ID: [name] GENDER: female DOB: 07-31-1955, MRN: [id number]  Chief Complaint  Patient presents with  . Follow-up    Sob is the same as last visit, dry cough that started 3 weeks ago    This is a 65 year old female, past medical history of OSA on CPAP, hypertension, hyper bulimia.  Referred for evaluation of dyspnea.  Patient saw Binnie Kand, NP cardiology office 08/25/2019.  Office visit reviewed.  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  Had ablation attempted by Dr. Lovena Le 2017 which was unsuccessful later seen by Dr. Glennon Mac at [facility] in 2019.  Had repeat ablation.  Echo with normal LVEF and grade 2 diastolic dysfunction.  She had a recent stress test which revealed no perfusion abnormalities etiology of her dyspnea have remained unclear.  Not felt to be related to heart failure.  Decision was made to refer to pulmonary medicine for evaluation.  OV 09/03/2019: she states that when she had covid19 she lost her taste and smell. Her SOB however has been going on for some time. She limited her exercise tolerance due to SVT history but restarted her exercise routine after her ablation. She currently works for [employer]. She has back problems and had back surgery. She has had some limitation due to this. Lifelong non-smoker.  Patient denies hemoptysis chest tightness.  Unsure about wheezing.  Does have significant dyspnea with climbing stairs or minimal exertion for activities of daily living.  OV 12/08/2019: Here today for follow-up.  Overall doing well.  Respiratory symptoms have resolved.  Her cough is better after starting PPI.  She is trying to become more active.  Lifelong non-smoker.  Denies hemoptysis, weight loss fevers chills night sweats nausea vomiting.  OV 12/27/2020: Here today for follow-up after recent CT chest.  Patient had CT scan of the chest on 12/16/2020 which  revealed multiple pulmonary nodules that are stable in size.  She does have a moderate hiatal hernia.  Pulmonary artery was also slightly enlarged at 5.1 cm.  She did however have an echocardiogram completed in January 2021.  As well as April 2022 which showed no evidence of PAH.  However grade 2 diastolic dysfunction.  Patient overall has no respiratory complaints.  She is anxious undergoing a lot of stress right now with father in the hospital.   Past Medical History:  Diagnosis Date  . Anxiety   . Daytime somnolence 04/06/2013  . DDD (degenerative disc disease), lumbar   . Depression   . Hypercholesteremia   . Hypertension   . OSA on CPAP 04/06/2013  . Sleep apnea   . Urinary incontinence      Family History  Problem Relation Age of Onset  . Heart disease Father   . Hypertension Father   . Stroke Father   . Hyperlipidemia Father   . Hyperlipidemia Mother   . Diabetes Maternal Grandmother      Past Surgical History:  Procedure Laterality Date  . ABLATION  09/29/2017  . BACK SURGERY  04/06/2019  . BREAST EXCISIONAL BIOPSY Left   . ELECTROPHYSIOLOGIC STUDY N/A 01/01/2016   Procedure: SVT Ablation;  Surgeon: Evans Lance, MD;  Location: [facility];  Service: Cardiovascular;  Laterality: N/A;  . HAND SURGERY Right   . KNEE ARTHROPLASTY Left   . NASAL SEPTUM SURGERY    . RADICAL HYSTERECTOMY  2000  . TONSILLECTOMY AND ADENOIDECTOMY  Social History   Socioeconomic History  . Marital status: Married    Spouse name: Not on file  . Number of children: 2  . Years of education: Not on file  . Highest education level: Not on file  Occupational History  . Not on file  Tobacco Use  . Smoking status: Never Smoker  . Smokeless tobacco: Never Used  Vaping Use  . Vaping Use: Never used  Substance and Sexual Activity  . Alcohol use: Not Currently    Alcohol/week: 0.0 standard drinks  . Drug use: No  . Sexual activity: Yes    Partners: Male    Birth  control/protection: Surgical    Comment: hysterectomy  Other Topics Concern  . Not on file  Social History Narrative  . Not on file   Social Determinants of Health   Financial Resource Strain: Not on file  Food Insecurity: Not on file  Transportation Needs: Not on file  Physical Activity: Not on file  Stress: Not on file  Social Connections: Not on file  Intimate Partner Violence: Not on file     No Known Allergies   Outpatient Medications Prior to Visit  Medication Sig Dispense Refill  . atorvastatin (LIPITOR) 10 MG tablet TAKE 1 TABLET BY MOUTH EVERY DAY 90 tablet 3  . cetirizine (ZYRTEC) 10 MG tablet Take 10 mg by mouth daily.    . fluticasone (FLONASE) 50 MCG/ACT nasal spray Place 1 spray into both nostrils daily as needed for allergies.     . sacubitril-valsartan (ENTRESTO) 49-51 MG Take 1 tablet by mouth 2 (two) times daily. 60 tablet 1  . spironolactone (ALDACTONE) 25 MG tablet Take 1 tablet (25 mg total) by mouth daily. 90 tablet 3  . vortioxetine HBr (TRINTELLIX) 20 MG TABS tablet Take 10 mg by mouth daily.     No facility-administered medications prior to visit.    Review of Systems  Constitutional: Negative for chills, fever, malaise/fatigue and weight loss.  HENT: Negative for hearing loss, sore throat and tinnitus.   Eyes: Negative for blurred vision and double vision.  Respiratory: Negative for cough, hemoptysis, sputum production, shortness of breath, wheezing and stridor.   Cardiovascular: Negative for chest pain, palpitations, orthopnea, leg swelling and PND.  Gastrointestinal: Negative for abdominal pain, constipation, diarrhea, heartburn, nausea and vomiting.  Genitourinary: Negative for dysuria, hematuria and urgency.  Musculoskeletal: Negative for joint pain and myalgias.  Skin: Negative for itching and rash.  Neurological: Negative for dizziness, tingling, weakness and headaches.  Endo/Heme/Allergies: Negative for environmental allergies. Does not  bruise/bleed easily.  Psychiatric/Behavioral: Negative for depression. The patient is not nervous/anxious and does not have insomnia.   All other systems reviewed and are negative.    Objective:  Physical Exam Vitals reviewed.  Constitutional:      General: She is not in acute distress.    Appearance: She is well-developed.  HENT:     Head: Normocephalic and atraumatic.  Eyes:     General: No scleral icterus.    Conjunctiva/sclera: Conjunctivae normal.     Pupils: Pupils are equal, round, and reactive to light.  Neck:     Vascular: No JVD.     Trachea: No tracheal deviation.  Cardiovascular:     Rate and Rhythm: Normal rate and regular rhythm.     Heart sounds: Normal heart sounds. No murmur heard.   Pulmonary:     Effort: Pulmonary effort is normal. No tachypnea, accessory muscle usage or respiratory distress.     Breath  sounds: Normal breath sounds. No stridor. No wheezing, rhonchi or rales.  Abdominal:     General: Bowel sounds are normal. There is no distension.     Palpations: Abdomen is soft.     Tenderness: There is no abdominal tenderness.  Musculoskeletal:        General: No tenderness.     Cervical back: Neck supple.  Lymphadenopathy:     Cervical: No cervical adenopathy.  Skin:    General: Skin is warm and dry.     Capillary Refill: Capillary refill takes less than 2 seconds.     Findings: No rash.  Neurological:     Mental Status: She is alert and oriented to person, place, and time.  Psychiatric:        Behavior: Behavior normal.      Vitals:   12/27/20 1046  BP: 134/82  Pulse: 76  Temp: (!) 97.2 F (36.2 C)  SpO2: 98%  Weight: 187 lb (84.8 kg)  Height: 5\' 5"  (1.651 m)   98% on RA BMI Readings from Last 3 Encounters:  12/27/20 31.12 kg/m  12/06/20 31.95 kg/m  10/12/20 30.62 kg/m   Wt Readings from Last 3 Encounters:  12/27/20 187 lb (84.8 kg)  12/06/20 192 lb (87.1 kg)  10/12/20 184 lb (83.5 kg)     CBC    Component Value  Date/Time   WBC 6.9 12/27/2015 0833   RBC 5.46 (H) 12/27/2015 0833   HGB 18.7 (H) 01/17/2016 1206   HCT 55.0 (H) 01/17/2016 1206   PLT 241 12/27/2015 0833   MCV 83.2 12/27/2015 0833   MCV 83.4 10/21/2015 1432   MCH 26.7 (L) 12/27/2015 0833   MCHC 32.2 12/27/2015 0833   RDW 14.6 12/27/2015 0833   LYMPHSABS 2,208 12/27/2015 0833   MONOABS 414 12/27/2015 0833   EOSABS 138 12/27/2015 0833   BASOSABS 69 12/27/2015 0833    Chest Imaging: 12/11/2017 chest x-ray: Small hiatal hernia bilateral clear lungs no infiltrate. The patient's images have been independently reviewed by me.    Pulmonary Functions Testing Results: PFT Results Latest Ref Rng & Units 11/05/2019  FVC-Pre L 2.29  FVC-Predicted Pre % 67  FVC-Post L 2.33  FVC-Predicted Post % 69  Pre FEV1/FVC % % 80  Post FEV1/FCV % % 82  FEV1-Pre L 1.83  FEV1-Predicted Pre % 70  FEV1-Post L 1.91  DLCO uncorrected ml/min/mmHg 21.75  DLCO UNC% % 104  DLCO corrected ml/min/mmHg 21.75  DLCO COR %Predicted % 104  DLVA Predicted % 129  TLC L 4.15  TLC % Predicted % 79  RV % Predicted % 80    Echocardiogram:  08/02/2019: Echocardiogram Normal LVEF grade 2 diastolic dysfunction  Myocardial perfusion Lexi scan: 08/16/2019: Normal myocardial perfusion results reviewed.  Heart Catheterization: None     Assessment & Plan:     ICD-10-CM   1. Multiple pulmonary nodules  R91.8 CT CHEST WO CONTRAST  2. Hiatal hernia  K44.9   3. History of COVID-19  Z86.16   4. Cough  R05.9     Discussion:  This is a 65 year old female, past medical history of COVID, had mild to moderate symptoms that were persistent with dyspnea afterwards.  Had a cardiac work-up and grade 2 diastolic dysfunction.  Had pulmonary function test which showed a preserved ratio and impaired spirometry I suspect related to body habitus.  She is on PPI for reflux.  CT scan of the chest was completed after finding multiple small nodules incidentally on a coronary calcium  scoring CT  Plan: Reviewed CT scan of the chest today in the office all of these nodules are subcentimeter. Will recommended a 1 year follow-up regarding the nodules She does have a thyroid nodule that was found, she has had prior ultrasound as well as biopsy.  Recommend follow-up with PCP regarding thyroid nodule. She also has a moderate hiatal hernia. She does have cough and reflux symptoms.  I think that she could consider surgical evaluation of the hiatal hernia I have referred to Dr. Kipp Brood for consideration of hernia repair. RTC in 1 year or as needed.   Current Outpatient Medications:  .  atorvastatin (LIPITOR) 10 MG tablet, TAKE 1 TABLET BY MOUTH EVERY DAY, Disp: 90 tablet, Rfl: 3 .  cetirizine (ZYRTEC) 10 MG tablet, Take 10 mg by mouth daily., Disp: , Rfl:  .  fluticasone (FLONASE) 50 MCG/ACT nasal spray, Place 1 spray into both nostrils daily as needed for allergies. , Disp: , Rfl:  .  sacubitril-valsartan (ENTRESTO) 49-51 MG, Take 1 tablet by mouth 2 (two) times daily., Disp: 60 tablet, Rfl: 1 .  spironolactone (ALDACTONE) 25 MG tablet, Take 1 tablet (25 mg total) by mouth daily., Disp: 90 tablet, Rfl: 3 .  vortioxetine HBr (TRINTELLIX) 20 MG TABS tablet, Take 10 mg by mouth daily., Disp: , Rfl:    Garner Nash, DO [facility] 12/27/2020 11:17 AM

## 2020-12-29 ENCOUNTER — Other Ambulatory Visit: Payer: Self-pay

## 2020-12-29 ENCOUNTER — Encounter: Payer: Self-pay | Admitting: Cardiology

## 2020-12-29 ENCOUNTER — Ambulatory Visit: Payer: 59 | Admitting: Cardiology

## 2020-12-29 VITALS — BP 116/73 | HR 85 | Temp 94.1°F | Resp 16 | Ht 65.0 in | Wt 185.0 lb

## 2020-12-29 DIAGNOSIS — I10 Essential (primary) hypertension: Secondary | ICD-10-CM

## 2020-12-29 DIAGNOSIS — R0609 Other forms of dyspnea: Secondary | ICD-10-CM

## 2020-12-29 DIAGNOSIS — R931 Abnormal findings on diagnostic imaging of heart and coronary circulation: Secondary | ICD-10-CM

## 2020-12-29 DIAGNOSIS — R0600 Dyspnea, unspecified: Secondary | ICD-10-CM

## 2020-12-29 MED ORDER — ASPIRIN EC 81 MG PO TBEC: 81.0000 mg | DELAYED_RELEASE_TABLET | Freq: Every day | ORAL | 3 refills | Status: DC

## 2020-12-29 MED ORDER — SPIRONOLACTONE 50 MG PO TABS: 50.0000 mg | ORAL_TABLET | Freq: Every day | ORAL | 2 refills | Status: DC

## 2020-12-29 NOTE — Progress Notes (Signed)
Follow up visit  Subjective:   [name], female    DOB: 18-Dec-1955, 65 y.o.   MRN: [id number]   HPI   Chief Complaint  Patient presents with  . Dyspnea on exertion  . Follow-up    65 y/o Caucasian female  with hypertension, hyperlipidemia, OSA, h/o AVNRT ablation, anxiety, depression, exertional dyspnea  Patient has continued to have exertional dyspnea symptoms They are lifestyle limiting, making dancing-something she loves-very difficult. She recently saw.  Patient underwent nuclear stress testing, echocardiogram, and CT cardiac scoring, details below. She was evaluated by pulmonologist Dr. Valeta Harms.   She is tolerating spironolactone well, but has had cough with Entresto.   Current Outpatient Medications on File Prior to Visit  Medication Sig Dispense Refill  . atorvastatin (LIPITOR) 10 MG tablet TAKE 1 TABLET BY MOUTH EVERY DAY 90 tablet 3  . cetirizine (ZYRTEC) 10 MG tablet Take 10 mg by mouth daily.    . fluticasone (FLONASE) 50 MCG/ACT nasal spray Place 1 spray into both nostrils daily as needed for allergies.     . sacubitril-valsartan (ENTRESTO) 49-51 MG Take 1 tablet by mouth 2 (two) times daily. 60 tablet 1  . spironolactone (ALDACTONE) 25 MG tablet Take 1 tablet (25 mg total) by mouth daily. 90 tablet 3  . vortioxetine HBr (TRINTELLIX) 20 MG TABS tablet Take 10 mg by mouth daily.     No current facility-administered medications on file prior to visit.    Cardiovascular & other pertient studies:  CT Cardiac scoring 11/20/2020: 1. Coronary artery calcium score of 141. This places the patient in the 88th percentile for patients of the same race, age and gender. LM: 15 LAD: 126 LCx: 0 RCA: 0 Total Agatston Score: 141 2. Numerous pulmonary nodules many with indistinct characteristics. Some nodule seen on previous imaging. Tiny size most of these nodules and the indistinct nature would favor an infectious or inflammatory process though findings are  nonspecific. Correlate with any history of neoplasm given that there is some random distribution and consider short interval follow-up if no history of neoplasm at 3 month interval. 3. Marked enlargement of the main pulmonary artery compatible with pulmonary arterial hypertension (4.7 cm). 4. Patulous esophagus with mild thickening distally. Correlate with any signs of esophagitis and or reflux with dedicated esophageal evaluation as warranted. 5. Mild aortic dilation without aneurysmal caliber.  6. Aortic atherosclerosis.  Exercise Sestamibi stress test 11/06/2020: Exercise nuclear stress test was performed using Bruce protocol. Patient reached 7.8 METS, and 83% of age predicted maximum heart rate. Exercise capacity was fair. No chest pain reported. Heart rate and hemodynamic response were normal. Stress EKG revealed no ischemic changes. SPECT images show very small sized, mild intensity, partially reversible perfusion defect in basal inferolateral myocardium. Stress LVEF 65%. Low risk study.   Echocardiogram 10/30/2020:  Left ventricle cavity is normal in size. Mild concentric hypertrophy of  the left ventricle. Normal global wall motion. Normal LV systolic function  with EF 60%. Doppler evidence of grade II (pseudonormal) diastolic  dysfunction, elevated LAP.  Mild (Grade I) aortic regurgitation.  Mild tricuspid regurgitation.  No evidence of pulmonary hypertension.  No significant change compared to previous study on 08/02/2019.   EKG 10/12/2020: Sinus rhythm 82 bpm Occasional PAC      Lexiscan Sestamibi stress test 08/16/2019: No previous exam available for comparison. Lexiscan/modified Bruce nuclear stress test performed using 1-day protocol. Myocardial perfusion imaging is normal. Stress LVEF 72%.  Echocardiogram 08/02/2019:  Mild concentric hypertrophy of the  left ventricle. Left ventricle cavity  is normal in size. Normal global wall motion. Normal LV systolic function  with  EF 62%. Doppler evidence of grade II (pseudonormal) diastolic  dysfunction, indeterminate LAP. Calculated EF 62%.  Probably trileaflet aortic valve with mild calcification. Trace aortic  stenosis. Trace aortic regurgitation.  Trace mitral regurgitation, trace tricuspid regurgitation.  Inadequate TR jet to estimate pulmonary artery systolic pressure.  Estimated RA pressure 8 mmHg.  No significant change compared to previous study on 08/29/2017.  Recent labs: 12/15/2020: Glucose 107, BUN/Cr 16/1.03. EGFR 61. Na/K 139/4.4.  NT prp BNP 86, normal  12/02/2019: Glucose 101, BUN/Cr 10/1.0. EGFR 56.  Chol 156, TG 91, HDL 51, LDL 87 TSH 1.8 normal    Review of Systems  Cardiovascular: Positive for dyspnea on exertion. Negative for chest pain, leg swelling, palpitations and syncope.      Vitals:   12/29/20 1111  BP: 116/73  Pulse: 85  Resp: 16  Temp: (!) 94.1 F (34.5 C)  SpO2: 98%     Body mass index is 30.79 kg/m. Filed Weights   12/29/20 1111  Weight: 185 lb (83.9 kg)     Objective:   Physical Exam Vitals and nursing note reviewed.  Constitutional:      General: She is not in acute distress. Neck:     Vascular: No JVD.  Cardiovascular:     Rate and Rhythm: Normal rate and regular rhythm.     Pulses: Intact distal pulses.     Heart sounds: Normal heart sounds. No murmur heard.   Pulmonary:     Effort: Pulmonary effort is normal.     Breath sounds: Normal breath sounds. No wheezing or rales.  Musculoskeletal:     Right lower leg: Edema (Trace) present.     Left lower leg: Edema (Trace) present.         Assessment & Recommendations:   65 y/o Caucasian female  with hypertension, hyperlipidemia, OSA, PSVT, grade II diastolic dysfunction, exertional dyspnea  Exertional dyspnea: Significant findings that could be contributing to exertional dyspnea include, grade 2 diastolic dysfunction with elevated left atrial pressure, CT cardiac scoring showing left main and  LAD calcification in spite of negative nuclear stress testing, multiple pulmonary nodules-felt to be benign, h/o COVID, and more importantly-dilated pulmonary artery at 4.7 raising suspicion of pulmonary hypertension, as well as moderate hiatal hernia.   She wants to hold off left and right heart catheterization for now- due to certain family committments. She has an upcoming appt with Dr. Kipp Brood.   Hypertension: Controlled.  Changes made as above.    F/u in 6 weeks  Tivon Lemoine Esther Hardy, MD [facility] Pager: [phone] Office: [phone]

## 2020-12-30 ENCOUNTER — Encounter: Payer: Self-pay | Admitting: Cardiology

## 2021-01-12 ENCOUNTER — Other Ambulatory Visit: Payer: Self-pay | Admitting: Internal Medicine

## 2021-01-12 DIAGNOSIS — E042 Nontoxic multinodular goiter: Secondary | ICD-10-CM

## 2021-01-15 ENCOUNTER — Other Ambulatory Visit: Payer: Self-pay

## 2021-01-15 ENCOUNTER — Encounter: Payer: Self-pay | Admitting: Thoracic Surgery (Cardiothoracic Vascular Surgery)

## 2021-01-15 ENCOUNTER — Institutional Professional Consult (permissible substitution): Payer: 59 | Admitting: Thoracic Surgery (Cardiothoracic Vascular Surgery)

## 2021-01-15 VITALS — BP 146/85 | HR 60 | Resp 20 | Ht 65.0 in | Wt 189.0 lb

## 2021-01-15 DIAGNOSIS — K449 Diaphragmatic hernia without obstruction or gangrene: Secondary | ICD-10-CM | POA: Diagnosis not present

## 2021-01-15 NOTE — Progress Notes (Signed)
[address]       ,[place] [address]             [phone]                    [name] Medical Record #[id number] Date of Birth: March 27, 1956  Referring: Garner Nash, DO Primary Care: Prince Solian, MD Primary Cardiologist: None  Chief Complaint:    Chief Complaint  Patient presents with   Hiatal Hernia    Initial surgical consult, CT chest 5/20    History of Present Illness:    [name] 65 y.o. female referred by Dr. Valeta Harms for surgical evaluation of a moderate size hiatal hernia.  She has a history of SVT and has undergone ablation, as well as small subcentimeter pulmonary nodules which have been stable.  Last year she did have an episode of reflux chronic cough which has resolved.  She does not take any antacid medication, and only has reflux with spicy foods and if she lays down too soon after meals.  She denies any dysphagia, or odynophagia.  She does have some exertional dyspnea, but is unsure of the source.      Zubrod Score: At the time of surgery this patient's most appropriate activity status/level should be described as: [x]     0    Normal activity, no symptoms []     1    Restricted in physical strenuous activity but ambulatory, able to do out light work []     2    Ambulatory and capable of self care, unable to do work activities, up and about               >50 % of waking hours                              []     3    Only limited self care, in bed greater than 50% of waking hours []     4    Completely disabled, no self care, confined to bed or chair []     5    Moribund   Past Medical History:  Diagnosis Date   Anxiety    Daytime somnolence 04/06/2013   DDD (degenerative disc disease), lumbar    Depression    Hypercholesteremia    Hypertension    OSA on CPAP 04/06/2013   Sleep apnea    Urinary incontinence     Past Surgical History:  Procedure Laterality Date   ABLATION  09/29/2017   BACK SURGERY   04/06/2019   BREAST EXCISIONAL BIOPSY Left    ELECTROPHYSIOLOGIC STUDY N/A 01/01/2016   Procedure: SVT Ablation;  Surgeon: Evans Lance, MD;  Location: [facility];  Service: Cardiovascular;  Laterality: N/A;   HAND SURGERY Right    KNEE ARTHROPLASTY Left    NASAL SEPTUM SURGERY     RADICAL HYSTERECTOMY  2000   TONSILLECTOMY AND ADENOIDECTOMY      Family History  Problem Relation Age of Onset   Heart disease Father    Hypertension Father    Stroke Father    Hyperlipidemia Father    Hyperlipidemia Mother    Diabetes Maternal Grandmother      Social History   Tobacco Use  Smoking Status Never  Smokeless Tobacco Never    Social History   Substance and Sexual Activity  Alcohol Use  Not Currently   Alcohol/week: 0.0 standard drinks     No Known Allergies  Current Outpatient Medications  Medication Sig Dispense Refill   aspirin EC 81 MG tablet Take 1 tablet (81 mg total) by mouth daily. Swallow whole. 90 tablet 3   atorvastatin (LIPITOR) 10 MG tablet TAKE 1 TABLET BY MOUTH EVERY DAY 90 tablet 3   cetirizine (ZYRTEC) 10 MG tablet Take 10 mg by mouth daily.     fluticasone (FLONASE) 50 MCG/ACT nasal spray Place 1 spray into both nostrils daily as needed for allergies.      spironolactone (ALDACTONE) 50 MG tablet Take 1 tablet (50 mg total) by mouth daily. 60 tablet 2   vortioxetine HBr (TRINTELLIX) 20 MG TABS tablet Take 10 mg by mouth daily.     No current facility-administered medications for this visit.    Review of Systems  Constitutional: Negative.   Respiratory:  Positive for shortness of breath. Negative for cough.   Cardiovascular:  Negative for chest pain.  Gastrointestinal:  Positive for heartburn. Negative for abdominal pain, nausea and vomiting.  Neurological: Negative.     PHYSICAL EXAMINATION: BP (!) 146/85 (BP Location: Left Arm, Patient Position: Sitting)   Pulse 60   Resp 20   Ht 5\' 5"  (1.651 m)   Wt 189 lb (85.7 kg)   SpO2 97% Comment:  RA  BMI 31.45 kg/m  Physical Exam Constitutional:      General: She is not in acute distress.    Appearance: Normal appearance. She is normal weight.  HENT:     Head: Normocephalic and atraumatic.  Eyes:     Extraocular Movements: Extraocular movements intact.  Cardiovascular:     Rate and Rhythm: Normal rate.  Pulmonary:     Effort: Pulmonary effort is normal. No respiratory distress.  Abdominal:     General: Abdomen is flat. There is no distension.  Musculoskeletal:        General: Normal range of motion.     Cervical back: Normal range of motion.  Neurological:     General: No focal deficit present.     Mental Status: She is alert and oriented to person, place, and time.    Diagnostic Studies & Laboratory data:    CT Scan: CT scan from 12/15/2020 was reviewed.  There is a 3.9 cm right thyroid nodule.  There is a moderate size hiatal hernia.  She does have bilateral subcentimeter pulmonary nodules which appear stable.      I have independently reviewed the above radiology studies  and reviewed the findings with the patient.   Recent Lab Findings: Lab Results  Component Value Date   WBC 6.9 12/27/2015   HGB 18.7 (H) 01/17/2016   HCT 55.0 (H) 01/17/2016   PLT 241 12/27/2015   GLUCOSE 107 (H) 12/15/2020   CHOL 174 10/30/2020   TRIG 93 10/30/2020   HDL 57 10/30/2020   LDLCALC 100 (H) 10/30/2020   NA 139 12/15/2020   K 4.4 12/15/2020   CL 104 12/15/2020   CREATININE 1.03 (H) 12/15/2020   BUN 16 12/15/2020   CO2 21 12/15/2020   TSH 2.070 10/30/2020      Problem List: Moderate size hiatal hernia Right thyroid nodule History of SVT status post ablation Coronary calcifications.   Assessment / Plan:   65 year old female with multiple medical problems.  She is referred for surgical evaluation of his moderate size hiatal hernia.  On review of her images, the hiatal hernia does go by the  left atrium, but it does not extend very high.  In regards to her symptoms,  she has been no complaints for dysphagia or reflux.  Based on the size of the hiatal hernia I do not think that her shortness of breath is secondary to this.  She is being evaluated by cardiology for coronary calcium score, and does have a history of SVT.  In regards to her pulmonary nodule we discussed the likelihood of progression of her symptoms as the hernia enlarges.  If her symptoms worsen that she will contact us for further evaluation of this.  She can continue pulmonary nodule surveillance with Dr. Valeta Harms, and I will defer to cardiology in regards to management of her arrhythmia, and coronary calcifications.     I  spent 40 minutes with the patient face to face counseling and coordination of care.    Lajuana Matte 01/15/2021 9:39 AM

## 2021-01-19 ENCOUNTER — Encounter: Payer: 59 | Admitting: Thoracic Surgery (Cardiothoracic Vascular Surgery)

## 2021-01-26 LAB — BASIC METABOLIC PANEL
BUN/Creatinine Ratio: 19 (ref 12–28)
BUN: 18 mg/dL (ref 8–27)
CO2: 23 mmol/L (ref 20–29)
Calcium: 10 mg/dL (ref 8.7–10.3)
Chloride: 100 mmol/L (ref 96–106)
Creatinine, Ser: 0.97 mg/dL (ref 0.57–1.00)
Glucose: 83 mg/dL (ref 65–99)
Potassium: 4.4 mmol/L (ref 3.5–5.2)
Sodium: 142 mmol/L (ref 134–144)
eGFR: 66 mL/min/{1.73_m2} (ref >59)

## 2021-01-30 ENCOUNTER — Ambulatory Visit
Admission: RE | Admit: 2021-01-30 | Discharge: 2021-01-30 | Disposition: A | Discharge status: Home / Self Care | Payer: 59 | Source: Ambulatory Visit | Attending: Internal Medicine | Admitting: Internal Medicine

## 2021-01-30 DIAGNOSIS — E042 Nontoxic multinodular goiter: Secondary | ICD-10-CM

## 2021-01-31 ENCOUNTER — Other Ambulatory Visit: Payer: Self-pay | Admitting: Gastroenterology

## 2021-01-31 DIAGNOSIS — K219 Gastro-esophageal reflux disease without esophagitis: Secondary | ICD-10-CM

## 2021-01-31 DIAGNOSIS — R1310 Dysphagia, unspecified: Secondary | ICD-10-CM

## 2021-02-06 ENCOUNTER — Ambulatory Visit
Admission: RE | Admit: 2021-02-06 | Discharge: 2021-02-06 | Disposition: A | Discharge status: Home / Self Care | Payer: 59 | Source: Ambulatory Visit | Attending: Gastroenterology | Admitting: Gastroenterology

## 2021-02-06 DIAGNOSIS — R1310 Dysphagia, unspecified: Secondary | ICD-10-CM

## 2021-02-06 DIAGNOSIS — K219 Gastro-esophageal reflux disease without esophagitis: Secondary | ICD-10-CM

## 2021-02-09 ENCOUNTER — Encounter: Payer: Self-pay | Admitting: Cardiology

## 2021-02-09 ENCOUNTER — Other Ambulatory Visit: Payer: Self-pay

## 2021-02-09 ENCOUNTER — Ambulatory Visit: Payer: 59 | Admitting: Cardiology

## 2021-02-09 VITALS — BP 143/84 | HR 57 | Temp 98.2°F | Wt 187.0 lb

## 2021-02-09 DIAGNOSIS — R0609 Other forms of dyspnea: Secondary | ICD-10-CM

## 2021-02-09 DIAGNOSIS — E782 Mixed hyperlipidemia: Secondary | ICD-10-CM

## 2021-02-09 DIAGNOSIS — R0600 Dyspnea, unspecified: Secondary | ICD-10-CM

## 2021-02-09 DIAGNOSIS — R931 Abnormal findings on diagnostic imaging of heart and coronary circulation: Secondary | ICD-10-CM

## 2021-02-09 MED ORDER — PRAVASTATIN SODIUM 40 MG PO TABS: 40.0000 mg | ORAL_TABLET | Freq: Every evening | ORAL | 3 refills | Status: DC

## 2021-02-09 NOTE — Progress Notes (Signed)
Follow up visit  Subjective:   [name], female    DOB: 1956-01-07, 65 y.o.   MRN: [id number]   HPI   Chief Complaint  Patient presents with   Dyspnea on exertion   Follow-up    6 week    65 y/o Caucasian female  with hypertension, hyperlipidemia, OSA, h/o AVNRT ablation, anxiety, depression, exertional dyspnea  Patient has continued to have exertional dyspnea symptoms They are lifestyle limiting, making dancing-something she loves-very difficult.  Patient underwent nuclear stress testing, echocardiogram, and CT cardiac scoring, details below. She was evaluated by pulmonologist Dr. Valeta Harms.  She also saw Dr. Kipp Brood given findings of hiatal hernia and pulmonary nodules.  Dr. Theodoro Parma does not think that hiatal hernia is large enough to cause her exertional dyspnea.  Average BP SBP is 130-140 mmHg.   Current Outpatient Medications on File Prior to Visit  Medication Sig Dispense Refill   aspirin EC 81 MG tablet Take 1 tablet (81 mg total) by mouth daily. Swallow whole. 90 tablet 3   atorvastatin (LIPITOR) 10 MG tablet TAKE 1 TABLET BY MOUTH EVERY DAY 90 tablet 3   cetirizine (ZYRTEC) 10 MG tablet Take 10 mg by mouth daily.     fluticasone (FLONASE) 50 MCG/ACT nasal spray Place 1 spray into both nostrils daily as needed for allergies.      spironolactone (ALDACTONE) 50 MG tablet Take 1 tablet (50 mg total) by mouth daily. 60 tablet 2   vortioxetine HBr (TRINTELLIX) 20 MG TABS tablet Take 10 mg by mouth daily.     No current facility-administered medications on file prior to visit.    Cardiovascular & other pertient studies:  CT Cardiac scoring 11/20/2020: 1. Coronary artery calcium score of 141. This places the patient in the 88th percentile for patients of the same race, age and gender. LM: 15 LAD: 126 LCx: 0 RCA: 0 Total Agatston Score: 141 2. Numerous pulmonary nodules many with indistinct characteristics. Some nodule seen on previous imaging. Tiny size most of  these nodules and the indistinct nature would favor an infectious or inflammatory process though findings are nonspecific. Correlate with any history of neoplasm given that there is some random distribution and consider short interval follow-up if no history of neoplasm at 3 month interval. 3. Marked enlargement of the main pulmonary artery compatible with pulmonary arterial hypertension (4.7 cm). 4. Patulous esophagus with mild thickening distally. Correlate with any signs of esophagitis and or reflux with dedicated esophageal evaluation as warranted. 5. Mild aortic dilation without aneurysmal caliber.  6. Aortic atherosclerosis.  Exercise Sestamibi stress test 11/06/2020: Exercise nuclear stress test was performed using Bruce protocol. Patient reached 7.8 METS, and 83% of age predicted maximum heart rate. Exercise capacity was fair. No chest pain reported. Heart rate and hemodynamic response were normal. Stress EKG revealed no ischemic changes. SPECT images show very small sized, mild intensity, partially reversible perfusion defect in basal inferolateral myocardium. Stress LVEF 65%. Low risk study.    Echocardiogram 10/30/2020:  Left ventricle cavity is normal in size. Mild concentric hypertrophy of  the left ventricle. Normal global wall motion. Normal LV systolic function  with EF 60%. Doppler evidence of grade II (pseudonormal) diastolic  dysfunction, elevated LAP.  Mild (Grade I) aortic regurgitation.  Mild tricuspid regurgitation.  No evidence of pulmonary hypertension.  No significant change compared to previous study on 08/02/2019.   EKG 10/12/2020: Sinus rhythm 82 bpm Occasional PAC      Lexiscan Sestamibi stress test 08/16/2019: No  previous exam available for comparison. Lexiscan/modified Bruce nuclear stress test performed using 1-day protocol. Myocardial perfusion imaging is normal. Stress LVEF 72%.  Echocardiogram 08/02/2019:  Mild concentric hypertrophy of the left  ventricle. Left ventricle cavity  is normal in size. Normal global wall motion. Normal LV systolic function  with EF 62%. Doppler evidence of grade II (pseudonormal) diastolic  dysfunction, indeterminate LAP. Calculated EF 62%.  Probably trileaflet aortic valve with mild calcification. Trace aortic  stenosis. Trace aortic regurgitation.  Trace mitral regurgitation, trace tricuspid regurgitation.  Inadequate TR jet to estimate pulmonary artery systolic pressure.  Estimated RA pressure 8 mmHg.  No significant change compared to previous study on 08/29/2017.  Recent labs: 12/15/2020: Glucose 107, BUN/Cr 16/1.03. EGFR 61. Na/K 139/4.4.  NT prp BNP 86, normal  12/02/2019: Glucose 101, BUN/Cr 10/1.0. EGFR 56.  Chol 156, TG 91, HDL 51, LDL 87 TSH 1.8 normal    Review of Systems  Cardiovascular:  Positive for dyspnea on exertion. Negative for chest pain, leg swelling, palpitations and syncope.     Vitals:   02/09/21 1056  BP: (!) 143/84  Pulse: (!) 57  Temp: 98.2 F (36.8 C)  SpO2: 97%    There is no height or weight on file to calculate BMI. Filed Weights   02/09/21 1056  Weight: 187 lb (84.8 kg)     Objective:   Physical Exam Vitals and nursing note reviewed.  Constitutional:      General: She is not in acute distress. Neck:     Vascular: No JVD.  Cardiovascular:     Rate and Rhythm: Normal rate and regular rhythm.     Heart sounds: Normal heart sounds. No murmur heard. Pulmonary:     Effort: Pulmonary effort is normal.     Breath sounds: Normal breath sounds. No wheezing or rales.        Assessment & Recommendations:   65 y/o Caucasian female  with hypertension, hyperlipidemia, OSA, PSVT, grade II diastolic dysfunction, exertional dyspnea  Exertional dyspnea: Significant findings that could be contributing to exertional dyspnea include, grade 2 diastolic dysfunction with elevated left atrial pressure, CT cardiac scoring showing left main and LAD calcification in  spite of negative nuclear stress testing, multiple pulmonary nodules-felt to be benign, h/o COVID, and more importantly-dilated pulmonary artery at 4.7 raising suspicion of pulmonary hypertension, as well as moderate hiatal hernia.  Given that hiatal hernia is now not felt to be large enough to explain her exertional dyspnea, I again discussed right and left heart catheterization for definite evaluation of coronary anatomy, as well as pulmonary hypertension.  Owing to certain family commitments, she would like to hold off heart catheterization, the surgeon.  Patient has had unchanged symptoms for over a year, and I do not think there is any urgency with this.  Patient will let me know if and when she would like to undergo this procedure.  Hypertension: Fairly well controlled. She has had significant swelling with amlodipine in the past.   Resting heart rate is in 50s.   In future, could consider adding hydralazine twice daily or 3 times daily.    F/u in 6 months  Regnia Mathwig Esther Hardy, MD [facility] Pager: [phone] Office: [phone]

## 2021-03-26 ENCOUNTER — Other Ambulatory Visit: Payer: Self-pay | Admitting: Internal Medicine

## 2021-03-26 DIAGNOSIS — E041 Nontoxic single thyroid nodule: Secondary | ICD-10-CM

## 2021-03-27 ENCOUNTER — Ambulatory Visit: Payer: 59 | Admitting: Cardiology

## 2021-04-10 ENCOUNTER — Other Ambulatory Visit (HOSPITAL_COMMUNITY)
Admission: RE | Admit: 2021-04-10 | Discharge: 2021-04-10 | Disposition: A | Discharge status: Home / Self Care | Payer: 59 | Source: Ambulatory Visit | Attending: Internal Medicine | Admitting: Internal Medicine

## 2021-04-10 ENCOUNTER — Ambulatory Visit
Admission: RE | Admit: 2021-04-10 | Discharge: 2021-04-10 | Disposition: A | Discharge status: Home / Self Care | Payer: 59 | Source: Ambulatory Visit | Attending: Internal Medicine | Admitting: Internal Medicine

## 2021-04-10 DIAGNOSIS — E041 Nontoxic single thyroid nodule: Secondary | ICD-10-CM | POA: Diagnosis present

## 2021-04-10 DIAGNOSIS — D440 Neoplasm of uncertain behavior of thyroid gland: Secondary | ICD-10-CM | POA: Diagnosis not present

## 2021-04-10 NOTE — Procedures (Signed)
PROCEDURE SUMMARY:  Using direct ultrasound guidance, 5 passes were made using 27 g needles into the nodule within the right lobe of the thyroid.   Ultrasound was used to confirm needle placements on all occasions.   EBL = trace  Specimens were sent to Pathology for analysis.  See procedure note under Imaging tab in Epic for full procedure details.  Nicolet Griffy S Dylan Ruotolo PA-C 04/10/2021 3:50 PM

## 2021-04-11 LAB — CYTOLOGY - NON PAP

## 2021-04-26 NOTE — Progress Notes (Signed)
Cardiology Office Note:    Date:  04/27/2021   ID:  [name], DOB July 29, 1956, MRN [id number]  PCP:  Prince Solian, MD   [facility] Providers Cardiologist:  Candee Furbish, MD     Referring MD: No ref. provider found    History of Present Illness:    [name] is a 65 y.o. female with a here for the evaluation of elevated CA score at the request of Dr. Dagmar Hait.  She did request transfer of her cardiology care from [place] [facility] to Heart care.  Overall she appears well, but reports experiencing shortness of breath and diaphoresis for a long time. Following her ablation in 2019 she began noticing the issues with shortness of breath. She had been walking across her work site and was becoming noticeably winded. She last followed with her pulmonologist 12/2020 and reports that lung findings were unremarkable at that visit.  We reviewed the results of her Calcium score 10/2020 in detail. We discussed her options for further cardiac testing.  Incidentally, she had a dilated pulmonary artery of 51 mm.  Echocardiogram showed normal RV function as well as normal PA pressures.  Right heart catheterization was previously discussed.  Grade II diastolic dysfunction noted.  Nuclear stress test showed no ischemia.  Previously while taking Crestor, she developed side effects of severe stomach pain.  She denies any palpitations, or chest pain. No lightheadedness, headaches, syncope, orthopnea, or PND. Also has no lower extremity edema or exertional symptoms.   Past Medical History:  Diagnosis Date   Anxiety    Daytime somnolence 04/06/2013   DDD (degenerative disc disease), lumbar    Depression    Hypercholesteremia    Hypertension    OSA on CPAP 04/06/2013   Sleep apnea    Urinary incontinence     Past Surgical History:  Procedure Laterality Date   ABLATION  09/29/2017   BACK SURGERY  04/06/2019   BREAST EXCISIONAL BIOPSY Left    ELECTROPHYSIOLOGIC STUDY N/A 01/01/2016    Procedure: SVT Ablation;  Surgeon: Evans Lance, MD;  Location: [facility];  Service: Cardiovascular;  Laterality: N/A;   HAND SURGERY Right    KNEE ARTHROPLASTY Left    NASAL SEPTUM SURGERY     RADICAL HYSTERECTOMY  2000   TONSILLECTOMY AND ADENOIDECTOMY      Current Medications: Current Meds  Medication Sig   aspirin EC 81 MG tablet Take 1 tablet (81 mg total) by mouth daily. Swallow whole.   cetirizine (ZYRTEC) 10 MG tablet Take 10 mg by mouth daily.   fluticasone (FLONASE) 50 MCG/ACT nasal spray Place 1 spray into both nostrils daily as needed for allergies.    metoprolol tartrate (LOPRESSOR) 50 MG tablet Take 1 tablet (50 mg total) by mouth once for 1 dose. Take one tablet 2 hours before CT scan   pravastatin (PRAVACHOL) 40 MG tablet Take 1 tablet (40 mg total) by mouth every evening.   spironolactone (ALDACTONE) 50 MG tablet Take 1 tablet (50 mg total) by mouth daily.   vortioxetine HBr (TRINTELLIX) 20 MG TABS tablet Take 10 mg by mouth daily.     Allergies:   Patient has no known allergies.   Social History   Socioeconomic History   Marital status: Married    Spouse name: Not on file   Number of children: 2   Years of education: Not on file   Highest education level: Not on file  Occupational History   Not on file  Tobacco Use   Smoking status: Never   Smokeless tobacco: Never  Vaping Use   Vaping Use: Never used  Substance and Sexual Activity   Alcohol use: Not Currently    Alcohol/week: 0.0 standard drinks   Drug use: No   Sexual activity: Yes    Partners: Male    Birth control/protection: Surgical    Comment: hysterectomy  Other Topics Concern   Not on file  Social History Narrative   Not on file   Social Determinants of Health   Financial Resource Strain: Not on file  Food Insecurity: Not on file  Transportation Needs: Not on file  Physical Activity: Not on file  Stress: Not on file  Social Connections: Not on file     Family  History: The patient's family history includes Diabetes in her maternal grandmother; Heart disease in her father; Hyperlipidemia in her father and mother; Hypertension in her father; Stroke in her father.  ROS:   Please see the history of present illness.    (+) Shortness of breath (+) Diaphoresis All other systems reviewed and are negative.  EKGs/Labs/Other Studies Reviewed:    The following studies were reviewed today:  CT Chest 12/15/2020: IMPRESSION: 1. Multiple pulmonary nodules are again identified. Several of the nodules are stable. A few of the nodules are a little larger in the interval. This could be due to difference in slice selection. Recommend a 3 month follow-up CT scan to assess stability. 2. 3.9 solid nodule in the right thyroid lobe. Recommend thyroid US (ref: J Am Coll Radiol. 2015 Feb;12(2): 143-50). 3. Moderate hiatal hernia. 4. Mild prominence of the ascending thoracic aorta measuring 3.7 cm without aneurysmal dilatation. 5. Coronary artery disease as above. 6. The main pulmonary artery measures up to 5.1 cm today consistent with pulmonary arterial hypertension. The difference compared to November 21, 2018 2 May be due to difference in slice selection and measurement technique. 7. No other abnormalities.  CT Cardiac Scoring 11/20/2020: COMPARISON:  Prior CT imaging of the abdomen and pelvis.   FINDINGS: CORONARY CALCIUM SCORES:   Left Main: 15   LAD: 126   LCx: 0   RCA: 0   Total Agatston Score: 141   MESA database percentile: 88   AORTA MEASUREMENTS:   Ascending Aorta: 37 mm   Descending Aorta: 25 mm   OTHER FINDINGS:   Cardiovascular: Calcified atherosclerosis of the thoracic aorta. Mild dilation of the thoracic aorta to 3.7 cm of the ascending segment.   Marked enlargement of the main pulmonary artery to 4.7 cm. Normal heart size without pericardial effusion. Persistent LEFT SVC drains and coronary sinus. Limited assessment of  cardiovascular structures given lack of intravenous contrast.   Mediastinum/Nodes: Patulous esophagus, mild thickening distally. Limited imaging of the mediastinum without adenopathy.   Lungs/Pleura: Small pulmonary nodules in the chest, (image 50, series 9) 3 mm nodule.   (Image 70, series 9) 4 mm pulmonary nodule at the RIGHT lung base in the RIGHT lower lobe.   Numerous tiny nodules throughout the chest elsewhere, for instance on image 51 of series 9 in the LEFT lower lobe is a 3-4 mm nodule with at least 10 ill-defined nodules in this area on the same image.   Many of these are indistinct. A 5 mm nodule in the LEFT upper lobe on image 11 of series 9. Indistinct nodularity in the lingula. No effusion. No consolidation.   Upper Abdomen: Incidental imaging of upper abdominal contents is unremarkable.   Musculoskeletal: No  acute bone finding or destructive bone process.   IMPRESSION: 1. Coronary artery calcium score of 141. This places the patient in the 88th percentile for patients of the same race, age and gender. 2. Numerous pulmonary nodules many with indistinct characteristics. Some nodule seen on previous imaging. Tiny size most of these nodules and the indistinct nature would favor an infectious or inflammatory process though findings are nonspecific. Correlate with any history of neoplasm given that there is some random distribution and consider short interval follow-up if no history of neoplasm at 3 month interval. 3. Marked enlargement of the main pulmonary artery compatible with pulmonary arterial hypertension. 4. Patulous esophagus with mild thickening distally. Correlate with any signs of esophagitis and or reflux with dedicated esophageal evaluation as warranted. 5. Mild aortic dilation without aneurysmal caliber. 6. Aortic atherosclerosis.  Exercise Sestamibi stress test 11/06/2020: Exercise nuclear stress test was performed using Bruce protocol. Patient  reached 7.8 METS, and 83% of age predicted maximum heart rate. Exercise capacity was fair. No chest pain reported. Heart rate and hemodynamic response were normal. Stress EKG revealed no ischemic changes. SPECT images show very small sized, mild intensity, partially reversible perfusion defect in basal inferolateral myocardium. Stress LVEF 65%. Low risk study.  Echo 10/30/2020: Left ventricle cavity is normal in size. Mild concentric hypertrophy of  the left ventricle. Normal global wall motion. Normal LV systolic function  with EF 60%. Doppler evidence of grade II (pseudonormal) diastolic  dysfunction, elevated LAP.  Mild (Grade I) aortic regurgitation.  Mild tricuspid regurgitation.  No evidence of pulmonary hypertension.  No significant change compared to previous study on 08/02/2019.   EKG:  EKG is personally reviewed and interpreted. 04/27/2021: Sinus rhythm. Rate 78 bpm. PACs.  Recent Labs: 10/30/2020: TSH 2.070 12/15/2020: NT-Pro BNP 86 01/25/2021: BUN 18; Creatinine, Ser 0.97; Potassium 4.4; Sodium 142   Recent Lipid Panel    Component Value Date/Time   CHOL 174 10/30/2020 1417   TRIG 93 10/30/2020 1417   HDL 57 10/30/2020 1417   CHOLHDL 3.1 10/30/2020 1417   LDLCALC 100 (H) 10/30/2020 1417          Physical Exam:    VS:  BP 140/90 (BP Location: Left Arm, Patient Position: Sitting, Cuff Size: Normal)   Pulse 78   Ht 5\' 5"  (1.651 m)   Wt 188 lb (85.3 kg)   SpO2 98%   BMI 31.28 kg/m     Wt Readings from Last 3 Encounters:  04/27/21 188 lb (85.3 kg)  02/09/21 187 lb (84.8 kg)  01/15/21 189 lb (85.7 kg)     GEN: Well nourished, well developed in no acute distress HEENT: Normal NECK: No JVD; No carotid bruits LYMPHATICS: No lymphadenopathy CARDIAC: RRR, no murmurs, rubs, gallops RESPIRATORY:  Clear to auscultation without rales, wheezing or rhonchi  ABDOMEN: Soft, non-tender, non-distended MUSCULOSKELETAL:  No edema; No deformity  SKIN: Warm and dry NEUROLOGIC:   Alert and oriented x 3 PSYCHIATRIC:  Normal affect    ASSESSMENT:    1. Elevated coronary artery calcium score   2. Primary hypertension   3. Dyspnea on exertion   4. 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 OSA on CPAP   7. Hiatal hernia   8. PSVT (paroxysmal supraventricular tachycardia) (HCC)    PLAN:    In order of problems listed above: Pulmonary artery aneurysm [facility]) Discussed her case with partner.  Interesting on CT scan that her pulmonary artery is dilated at 51  mm however the remainder of her left and right pulmonary artery and other branches appear fairly normal in size.  Right ventricle also appears fairly normal in size.  Could this be an isolated pulmonary artery aneurysm?  What we will do next is check a CT scan of chest with contrast to exclude the possibility of chronic thromboembolic disease and we will also check a coronary CT scan at the same time to exclude any anomalies, fistulas etc. and to ensure that her left main as well as LAD calcification is nonobstructive as well as felt to be on nuclear stress test.  Further testing may be right heart cath to ensure that her pulmonary pressures are in fact normal as was estimated on echocardiogram.  This is a fairly unique case if isolated pulmonary artery aneurysm is noted.  Appreciate assistance from other teammates. - CT scan will require special bolus technique in order to provide full details of pulmonary artery tree as well as coronary artery tree. -We will give her metoprolol 50 mg prior to.  Elevated coronary artery calcium score She has tried atorvastatin as well as Crestor.  Higher doses than 10 mg seem to upset her stomach.  At 1 point she thought she had pancreatitis.  She seems to be tolerating Pravachol/pravastatin 40 mg well at this point.  Ideally, it would be nice for her to be on a high intensity statin as was trialed previously.  She may ultimately require additional agent such as PCSK9  inhibitor for more aggressive care.  Primary hypertension Currently on spironolactone.  Blood pressure under reasonable control.  If she did have a case of longstanding primary pulmonary hypertension with RV failure, 1 would expect hypotension in this setting.  We do not see this currently.  OSA on CPAP Currently treated.  Pulmonary evaluation for pulmonary nodules also performed.  They will keep an eye on this.  PFTs were unremarkable.  Hiatal hernia Demonstrated this to her on CT scan personally reviewed and interpreted.  Without contrast.  Fairly impressive hiatal hernia.  PSVT (paroxysmal supraventricular tachycardia) ([place]) Previously had seen Dr. Lovena Le here, ultimately ended up going to [facility] in 2019 for SVT ablative therapy.  We will follow-up with results of testing for next steps.    Medication Adjustments/Labs and Tests Ordered: Current medicines are reviewed at length with the patient today.  Concerns regarding medicines are outlined above.   Orders Placed This Encounter  Procedures   CT CORONARY MORPH W/CTA COR W/SCORE W/CA W/CM &/OR WO/CM   CT Angio Chest Pulmonary Embolism (PE) W or WO Contrast   Basic metabolic panel   EKG [id number]    Meds ordered this encounter  Medications   metoprolol tartrate (LOPRESSOR) 50 MG tablet    Sig: Take 1 tablet (50 mg total) by mouth once for 1 dose. Take one tablet 2 hours before CT scan    Dispense:  1 tablet    Refill:  0    Patient Instructions  Medication Instructions:  The current medical regimen is effective;  continue present plan and medications.  *If you need a refill on your cardiac medications before your next appointment, please call your pharmacy*   Lab Work: Please have blood work today (BMP)  If you have labs (blood work) drawn today and your tests are completely normal, you will receive your results only by: [place] (if you have MyChart) OR A paper copy in the mail If you have any lab  test that is abnormal  or we need to change your treatment, we will call you to review the results.   Testing/Procedures: Non-Cardiac CT Angiography (CTA), is a special type of CT scan that uses a computer to produce multi-dimensional views of major blood vessels throughout the body. In CT angiography, a contrast material is injected through an IV to help visualize the blood vessels.      Your cardiac CT will be scheduled at:  [facility] [address] [place], [place] [address] [phone]  Please arrive at the [facility] main entrance (entrance A) of [facility] 30 minutes prior to test start time. Proceed to the [facility] Radiology Department (first floor) to check-in and test prep.  Please follow these instructions carefully (unless otherwise directed):  On the Night Before the Test: Be sure to Drink plenty of water. Do not consume any caffeinated/decaffeinated beverages or chocolate 12 hours prior to your test. Do not take any antihistamines 12 hours prior to your test.  On the Day of the Test: Drink plenty of water until 1 hour prior to the test. Do not eat any food 4 hours prior to the test. You may take your regular medications prior to the test.  Take metoprolol (Lopressor) two hours prior to test. HOLD Furosemide/Hydrochlorothiazide morning of the test. FEMALES- please wear underwire-free bra if available, avoid dresses & tight clothing  After the Test: Drink plenty of water. After receiving IV contrast, you may experience a mild flushed feeling. This is normal. On occasion, you may experience a mild rash up to 24 hours after the test. This is not dangerous. If this occurs, you can take Benadryl 25 mg and increase your fluid intake. If you experience trouble breathing, this can be serious. If it is severe call 911 IMMEDIATELY. If it is mild, please call our office.  Please allow 2-4 weeks for scheduling of routine cardiac CTs. Some insurance  companies require a pre-authorization which may delay scheduling of this test.   For non-scheduling related questions, please contact the cardiac imaging nurse navigator should you have any questions/concerns: Marchia Bond, Cardiac Imaging Nurse Navigator Gordy Clement, Cardiac Imaging Nurse Navigator [facility] Direct Office Dial: [phone]   For scheduling needs, including cancellations and rescheduling, please call [place], [phone].  These two tests should be completed on the same day at the same time.   Follow-Up: At [facility], you and your health needs are our priority.  As part of our continuing mission to provide you with exceptional heart care, we have created designated Provider Care Teams.  These Care Teams include your primary Cardiologist (physician) and Advanced Practice Providers (APPs -  Physician Assistants and Nurse Practitioners) who all work together to provide you with the care you need, when you need it.  We recommend signing up for the patient portal called "MyChart".  Sign up information is provided on this After Visit Summary.  MyChart is used to connect with patients for Virtual Visits (Telemedicine).  Patients are able to view lab/test results, encounter notes, upcoming appointments, etc.  Non-urgent messages can be sent to your provider as well.   To learn more about what you can do with MyChart, go to [email or web address].    Your next appointment:   Follow up to be determined after the above testing has been completed.  Thank you for choosing [facility]!!     I,Mathew Stumpf,acting as a scribe for [employer], MD.,have documented all relevant documentation on the  behalf of Candee Furbish, MD,as directed by  Candee Furbish, MD while in the presence of Candee Furbish, MD.  I, Candee Furbish, MD, have reviewed all documentation for this visit. The documentation on 04/27/21 for the exam, diagnosis, procedures, and  orders are all accurate and complete.   Signed, Candee Furbish, MD  04/27/2021 1:32 PM    [facility]

## 2021-04-27 ENCOUNTER — Ambulatory Visit: Payer: 59 | Admitting: Cardiology

## 2021-04-27 ENCOUNTER — Encounter: Payer: Self-pay | Admitting: Cardiology

## 2021-04-27 ENCOUNTER — Other Ambulatory Visit: Payer: Self-pay

## 2021-04-27 VITALS — BP 140/90 | HR 78 | Ht 65.0 in | Wt 188.0 lb

## 2021-04-27 DIAGNOSIS — I281 Aneurysm of pulmonary artery: Secondary | ICD-10-CM | POA: Diagnosis not present

## 2021-04-27 DIAGNOSIS — I10 Essential (primary) hypertension: Secondary | ICD-10-CM

## 2021-04-27 DIAGNOSIS — Z9989 Dependence on other enabling machines and devices: Secondary | ICD-10-CM

## 2021-04-27 DIAGNOSIS — K449 Diaphragmatic hernia without obstruction or gangrene: Secondary | ICD-10-CM

## 2021-04-27 DIAGNOSIS — I471 Supraventricular tachycardia: Secondary | ICD-10-CM

## 2021-04-27 DIAGNOSIS — Z01812 Encounter for preprocedural laboratory examination: Secondary | ICD-10-CM

## 2021-04-27 DIAGNOSIS — R0600 Dyspnea, unspecified: Secondary | ICD-10-CM | POA: Diagnosis not present

## 2021-04-27 DIAGNOSIS — R931 Abnormal findings on diagnostic imaging of heart and coronary circulation: Secondary | ICD-10-CM | POA: Diagnosis not present

## 2021-04-27 DIAGNOSIS — R0609 Other forms of dyspnea: Secondary | ICD-10-CM

## 2021-04-27 DIAGNOSIS — G4733 Obstructive sleep apnea (adult) (pediatric): Secondary | ICD-10-CM

## 2021-04-27 LAB — BASIC METABOLIC PANEL
BUN/Creatinine Ratio: 20 (ref 12–28)
BUN: 20 mg/dL (ref 8–27)
CO2: 24 mmol/L (ref 20–29)
Calcium: 10.5 mg/dL — ABNORMAL HIGH (ref 8.7–10.3)
Chloride: 102 mmol/L (ref 96–106)
Creatinine, Ser: 1.01 mg/dL — ABNORMAL HIGH (ref 0.57–1.00)
Glucose: 95 mg/dL (ref 70–99)
Potassium: 4.7 mmol/L (ref 3.5–5.2)
Sodium: 141 mmol/L (ref 134–144)
eGFR: 63 mL/min/{1.73_m2} (ref >59)

## 2021-04-27 MED ORDER — METOPROLOL TARTRATE 50 MG PO TABS: 50.0000 mg | ORAL_TABLET | Freq: Once | ORAL | 0 refills | Status: DC

## 2021-04-27 NOTE — Assessment & Plan Note (Signed)
Currently treated.  Pulmonary evaluation for pulmonary nodules also performed.  They will keep an eye on this.  PFTs were unremarkable.

## 2021-04-27 NOTE — Assessment & Plan Note (Signed)
She has tried atorvastatin as well as Crestor.  Higher doses than 10 mg seem to upset her stomach.  At 1 point she thought she had pancreatitis.  She seems to be tolerating Pravachol/pravastatin 40 mg well at this point.  Ideally, it would be nice for her to be on a high intensity statin as was trialed previously.  She may ultimately require additional agent such as PCSK9 inhibitor for more aggressive care.

## 2021-04-27 NOTE — Assessment & Plan Note (Signed)
Demonstrated this to her on CT scan personally reviewed and interpreted.  Without contrast.  Fairly impressive hiatal hernia.

## 2021-04-27 NOTE — Assessment & Plan Note (Signed)
Previously had seen Dr. Lovena Le here, ultimately ended up going to [facility] in 2019 for SVT ablative therapy.

## 2021-04-27 NOTE — Patient Instructions (Addendum)
Medication Instructions:  The current medical regimen is effective;  continue present plan and medications.  *If you need a refill on your cardiac medications before your next appointment, please call your pharmacy*   Lab Work: Please have blood work today (BMP)  If you have labs (blood work) drawn today and your tests are completely normal, you will receive your results only by: MyChart Message (if you have MyChart) OR A paper copy in the mail If you have any lab test that is abnormal or we need to change your treatment, we will call you to review the results.   Testing/Procedures: Non-Cardiac CT Angiography (CTA), is a special type of CT scan that uses a computer to produce multi-dimensional views of major blood vessels throughout the body. In CT angiography, a contrast material is injected through an IV to help visualize the blood vessels.      Your cardiac CT will be scheduled at:  [facility] [address] [place], [place] [address] [phone]  Please arrive at the [facility] main entrance (entrance A) of [facility] 30 minutes prior to test start time. Proceed to the [facility] Radiology Department (first floor) to check-in and test prep.  Please follow these instructions carefully (unless otherwise directed):  On the Night Before the Test: Be sure to Drink plenty of water. Do not consume any caffeinated/decaffeinated beverages or chocolate 12 hours prior to your test. Do not take any antihistamines 12 hours prior to your test.  On the Day of the Test: Drink plenty of water until 1 hour prior to the test. Do not eat any food 4 hours prior to the test. You may take your regular medications prior to the test.  Take metoprolol (Lopressor) two hours prior to test. HOLD Furosemide/Hydrochlorothiazide morning of the test. FEMALES- please wear underwire-free bra if available, avoid dresses & tight clothing  After the Test: Drink plenty of  water. After receiving IV contrast, you may experience a mild flushed feeling. This is normal. On occasion, you may experience a mild rash up to 24 hours after the test. This is not dangerous. If this occurs, you can take Benadryl 25 mg and increase your fluid intake. If you experience trouble breathing, this can be serious. If it is severe call 911 IMMEDIATELY. If it is mild, please call our office.  Please allow 2-4 weeks for scheduling of routine cardiac CTs. Some insurance companies require a pre-authorization which may delay scheduling of this test.   For non-scheduling related questions, please contact the cardiac imaging nurse navigator should you have any questions/concerns: Marchia Bond, Cardiac Imaging Nurse Navigator Gordy Clement, Cardiac Imaging Nurse Navigator [facility] Direct Office Dial: [phone]   For scheduling needs, including cancellations and rescheduling, please call [place], [phone].  These two tests should be completed on the same day at the same time.   Follow-Up: At [facility], you and your health needs are our priority.  As part of our continuing mission to provide you with exceptional heart care, we have created designated Provider Care Teams.  These Care Teams include your primary Cardiologist (physician) and Advanced Practice Providers (APPs -  Physician Assistants and Nurse Practitioners) who all work together to provide you with the care you need, when you need it.  We recommend signing up for the patient portal called "MyChart".  Sign up information is provided on this After Visit Summary.  MyChart is used to connect with patients for Virtual Visits (  Telemedicine).  Patients are able to view lab/test results, encounter notes, upcoming appointments, etc.  Non-urgent messages can be sent to your provider as well.   To learn more about what you can do with MyChart, go to [email or web address].    Your next  appointment:   Follow up to be determined after the above testing has been completed.  Thank you for choosing [facility]!!

## 2021-04-27 NOTE — Assessment & Plan Note (Signed)
Currently on spironolactone.  Blood pressure under reasonable control.  If she did have a case of longstanding primary pulmonary hypertension with RV failure, 1 would expect hypotension in this setting.  We do not see this currently.

## 2021-04-27 NOTE — Assessment & Plan Note (Signed)
Discussed her case with partner.  Interesting on CT scan that her pulmonary artery is dilated at 51 mm however the remainder of her left and right pulmonary artery and other branches appear fairly normal in size.  Right ventricle also appears fairly normal in size.  Could this be an isolated pulmonary artery aneurysm?  What we will do next is check a CT scan of chest with contrast to exclude the possibility of chronic thromboembolic disease and we will also check a coronary CT scan at the same time to exclude any anomalies, fistulas etc. and to ensure that her left main as well as LAD calcification is nonobstructive as well as felt to be on nuclear stress test.  Further testing may be right heart cath to ensure that her pulmonary pressures are in fact normal as was estimated on echocardiogram.  This is a fairly unique case if isolated pulmonary artery aneurysm is noted.  Appreciate assistance from other teammates. - CT scan will require special bolus technique in order to provide full details of pulmonary artery tree as well as coronary artery tree. -We will give her metoprolol 50 mg prior to.

## 2021-05-02 ENCOUNTER — Telehealth (HOSPITAL_COMMUNITY): Payer: Self-pay | Admitting: *Deleted

## 2021-05-02 NOTE — Telephone Encounter (Signed)
Reaching out to patient to offer assistance regarding upcoming cardiac imaging study; pt verbalizes understanding of appt date/time, parking situation and where to check in, pre-test NPO status and medications ordered, and verified current allergies; name and call back number provided for further questions should they arise  Gordy Clement RN Navigator Cardiac Imaging [facility] [phone] office [phone] cell  Patient to take 50mg  metoprolol tartrate two hours prior to cardiac CT scan.  Pt reports having claustrophobia.

## 2021-05-04 ENCOUNTER — Other Ambulatory Visit: Payer: Self-pay

## 2021-05-04 ENCOUNTER — Ambulatory Visit (HOSPITAL_COMMUNITY)
Admission: RE | Admit: 2021-05-04 | Discharge: 2021-05-04 | Disposition: A | Discharge status: Home / Self Care | Payer: 59 | Source: Ambulatory Visit | Attending: Cardiology | Admitting: Cardiology

## 2021-05-04 DIAGNOSIS — Z01812 Encounter for preprocedural laboratory examination: Secondary | ICD-10-CM | POA: Diagnosis present

## 2021-05-04 DIAGNOSIS — I700 Atherosclerosis of aorta: Secondary | ICD-10-CM | POA: Diagnosis not present

## 2021-05-04 DIAGNOSIS — R0609 Other forms of dyspnea: Secondary | ICD-10-CM | POA: Diagnosis present

## 2021-05-04 DIAGNOSIS — R931 Abnormal findings on diagnostic imaging of heart and coronary circulation: Secondary | ICD-10-CM | POA: Insufficient documentation

## 2021-05-04 MED ORDER — IOHEXOL 350 MG/ML SOLN
100.0000 mL | Freq: Once | INTRAVENOUS | Status: AC | PRN
  Administered 2021-05-04: 95 mL via INTRAVENOUS

## 2021-05-04 MED ORDER — NITROGLYCERIN 0.4 MG SL SUBL: 0.8000 mg | SUBLINGUAL_TABLET | Freq: Once | SUBLINGUAL | Status: AC

## 2021-05-04 MED ORDER — NITROGLYCERIN 0.4 MG SL SUBL
SUBLINGUAL_TABLET | SUBLINGUAL | Status: AC
  Administered 2021-05-04: 0.8 mg via SUBLINGUAL
  Filled 2021-05-04: qty 2

## 2021-05-07 ENCOUNTER — Encounter (HOSPITAL_COMMUNITY): Payer: Self-pay

## 2021-05-09 ENCOUNTER — Ambulatory Visit: Payer: 59 | Admitting: Cardiology

## 2021-05-09 ENCOUNTER — Other Ambulatory Visit: Payer: Self-pay

## 2021-05-09 ENCOUNTER — Telehealth: Payer: Self-pay | Admitting: Cardiology

## 2021-05-09 ENCOUNTER — Encounter: Payer: Self-pay | Admitting: Cardiology

## 2021-05-09 VITALS — BP 120/80 | Ht 65.0 in | Wt 189.0 lb

## 2021-05-09 DIAGNOSIS — R0602 Shortness of breath: Secondary | ICD-10-CM

## 2021-05-09 DIAGNOSIS — R931 Abnormal findings on diagnostic imaging of heart and coronary circulation: Secondary | ICD-10-CM | POA: Diagnosis not present

## 2021-05-09 DIAGNOSIS — I10 Essential (primary) hypertension: Secondary | ICD-10-CM

## 2021-05-09 DIAGNOSIS — E782 Mixed hyperlipidemia: Secondary | ICD-10-CM

## 2021-05-09 DIAGNOSIS — I281 Aneurysm of pulmonary artery: Secondary | ICD-10-CM | POA: Diagnosis not present

## 2021-05-09 DIAGNOSIS — Z01812 Encounter for preprocedural laboratory examination: Secondary | ICD-10-CM | POA: Diagnosis not present

## 2021-05-09 DIAGNOSIS — G4733 Obstructive sleep apnea (adult) (pediatric): Secondary | ICD-10-CM

## 2021-05-09 DIAGNOSIS — Z9989 Dependence on other enabling machines and devices: Secondary | ICD-10-CM

## 2021-05-09 LAB — CBC
Hematocrit: 47.9 % — ABNORMAL HIGH (ref 34.0–46.6)
Hemoglobin: 15.4 g/dL (ref 11.1–15.9)
MCH: 26.5 pg — ABNORMAL LOW (ref 26.6–33.0)
MCHC: 32.2 g/dL (ref 31.5–35.7)
MCV: 82 fL (ref 79–97)
Platelets: 211 10*3/uL (ref 150–450)
RBC: 5.82 x10E6/uL — ABNORMAL HIGH (ref 3.77–5.28)
RDW: 13.7 % (ref 11.7–15.4)
WBC: 7.5 10*3/uL (ref 3.4–10.8)

## 2021-05-09 LAB — BASIC METABOLIC PANEL
BUN/Creatinine Ratio: 15 (ref 12–28)
BUN: 16 mg/dL (ref 8–27)
CO2: 23 mmol/L (ref 20–29)
Calcium: 10.2 mg/dL (ref 8.7–10.3)
Chloride: 101 mmol/L (ref 96–106)
Creatinine, Ser: 1.04 mg/dL — ABNORMAL HIGH (ref 0.57–1.00)
Glucose: 92 mg/dL (ref 70–99)
Potassium: 5 mmol/L (ref 3.5–5.2)
Sodium: 138 mmol/L (ref 134–144)
eGFR: 60 mL/min/{1.73_m2} (ref >59)

## 2021-05-09 NOTE — Telephone Encounter (Signed)
Pt had a coronary CT and did not need to have this one as well.  Pt is aware and was grateful for the call back and information. Order has been cancelled.

## 2021-05-09 NOTE — Patient Instructions (Signed)
Medication Instructions:  The current medical regimen is effective;  continue present plan and medications.  *If you need a refill on your cardiac medications before your next appointment, please call your pharmacy*  Lab Work: Please have blood work today (CBC, BMP) If you have labs (blood work) drawn today and your tests are completely normal, you will receive your results only by: MyChart Message (if you have [place]) OR A paper copy in the mail If you have any lab test that is abnormal or we need to change your treatment, we will call you to review the results.   Testing/Procedures:   [place] OFFICE [place], [place] [address] Dept: [phone] Loc: [place]  05/09/2021  You are scheduled for a Cardiac Catheterization on Tuesday, October 25 with Dr. Larae Grooms.  1. Please arrive at the [facility] (Main Entrance A) at [facility]: [place], [place] [address] at 7:00 AM (two hours before your procedure to ensure your preparation). Free valet parking service is available.   Special note: Every effort is made to have your procedure done on time. Please understand that emergencies sometimes delay scheduled procedures.  2. Diet: nothing after midnight the night before  3. Labs:  today  4. Medication instructions in preparation for your procedure:  May hold spironolactone this AM.  On the morning of your procedure, take your  and any morning medicines NOT listed above.  You may use sips of water.  5. Plan for one night stay--bring personal belongings. 6. Bring a current list of your medications and current insurance cards. 7. You MUST have a responsible person to drive you home. 8. Someone MUST be with you the first 24 hours after you arrive home or your discharge will be delayed. 9. Please wear clothes that are easy to get  on and off and wear slip-on shoes.  Thank you for allowing [name] to care for you!   -- [facility]  Follow-Up: At [facility], you and your health needs are our priority.  As part of our continuing mission to provide you with exceptional heart care, we have created designated Provider Care Teams.  These Care Teams include your primary Cardiologist (physician) and Advanced Practice Providers (APPs -  Physician Assistants and Nurse Practitioners) who all work together to provide you with the care you need, when you need it.  We recommend signing up for the patient portal called "MyChart".  Sign up information is provided on this After Visit Summary.  MyChart is used to connect with patients for Virtual Visits (Telemedicine).  Patients are able to view lab/test results, encounter notes, upcoming appointments, etc.  Non-urgent messages can be sent to your provider as well.   To learn more about what you can do with MyChart, go to [email or web address].    Your next appointment:   3 month(s)  The format for your next appointment:   In Person  Provider:   Candee Furbish, MD  Thank you for choosing [facility]!!

## 2021-05-09 NOTE — Assessment & Plan Note (Signed)
As scribed above.  Continue with Pravachol.  Unable to tolerate higher doses of Crestor or atorvastatin.

## 2021-05-09 NOTE — Assessment & Plan Note (Signed)
Mild nonobstructive disease noted on coronary CT scan.  Previously has tried atorvastatin as well as Crestor.  Higher doses than 10 mg seem to upset her stomach.  She seems to be tolerating the pravastatin 40 mg well.  Ultimately, I would like to see her LDL less than 70.  We will continue to monitor.  Could always consider PCSK9 inhibitor if necessary in the future.

## 2021-05-09 NOTE — Assessment & Plan Note (Addendum)
Overall good blood pressure control today.  She did show me her blood pressure readings some of which are in the 140s to 150 range occasionally.  Continue to monitor.  No evidence of hypotension.  No evidence of RV failure on echocardiogram.  Currently on spironolactone.  No changes in medical management.

## 2021-05-09 NOTE — H&P (View-Only) (Signed)
Cardiology Office Note:    Date:  05/09/2021   ID:  [name], DOB 31-Jan-1956, MRN [id number]  PCP:  Prince Solian, MD   [facility] Providers Cardiologist:  Candee Furbish, MD     Referring MD: Prince Solian, MD     History of Present Illness:    [name] is a 65 y.o. 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  CT scan below.  Symptoms have been fairly longstanding shortness of breath following her ablation in 2019.  No evidence of pulmonary vein stenosis.  Pulmonary visit unremarkable.  Minimal coronary artery disease nonobstructive on CT.  Had lengthy discussion with she and her husband present.  Showed images of CT scan.  Past Medical History:  Diagnosis Date   Anxiety    Daytime somnolence 04/06/2013   DDD (degenerative disc disease), lumbar    Depression    Hypercholesteremia    Hypertension    OSA on CPAP 04/06/2013   Sleep apnea    Urinary incontinence     Past Surgical History:  Procedure Laterality Date   ABLATION  09/29/2017   BACK SURGERY  04/06/2019   BREAST EXCISIONAL BIOPSY Left    ELECTROPHYSIOLOGIC STUDY N/A 01/01/2016   Procedure: SVT Ablation;  Surgeon: Evans Lance, MD;  Location: [facility];  Service: Cardiovascular;  Laterality: N/A;   HAND SURGERY Right    KNEE ARTHROPLASTY Left    NASAL SEPTUM SURGERY     RADICAL HYSTERECTOMY  2000   TONSILLECTOMY AND ADENOIDECTOMY      Current Medications: Current Meds  Medication Sig   aspirin EC 81 MG tablet Take 1 tablet (81 mg total) by mouth daily. Swallow whole.   cetirizine (ZYRTEC) 10 MG tablet Take 10 mg by mouth daily.   fluticasone (FLONASE) 50 MCG/ACT nasal spray Place 1 spray into both nostrils daily as needed for allergies.    pravastatin (PRAVACHOL) 40 MG tablet Take 1 tablet (40 mg total) by mouth every evening.   spironolactone (ALDACTONE) 50 MG tablet Take 1 tablet (50 mg total) by mouth daily.   vortioxetine HBr (TRINTELLIX) 20 MG TABS tablet Take  10 mg by mouth daily.     Allergies:   Patient has no known allergies.   Social History   Socioeconomic History   Marital status: Married    Spouse name: Not on file   Number of children: 2   Years of education: Not on file   Highest education level: Not on file  Occupational History   Not on file  Tobacco Use   Smoking status: Never   Smokeless tobacco: Never  Vaping Use   Vaping Use: Never used  Substance and Sexual Activity   Alcohol use: Not Currently    Alcohol/week: 0.0 standard drinks   Drug use: No   Sexual activity: Yes    Partners: Male    Birth control/protection: Surgical    Comment: hysterectomy  Other Topics Concern   Not on file  Social History Narrative   Not on file   Social Determinants of Health   Financial Resource Strain: Not on file  Food Insecurity: Not on file  Transportation Needs: Not on file  Physical Activity: Not on file  Stress: Not on file  Social Connections: Not on file     Family History: The patient's family history includes Diabetes in her maternal grandmother; Heart disease in her father; Hyperlipidemia in her father and mother; Hypertension in her father; Stroke in her father.  ROS:   Please see the history of present illness.     All other systems reviewed and are negative.  EKGs/Labs/Other Studies Reviewed:    The following studies were reviewed today:  Coronary CT scan 05/04/2021: Aorta: Normal size. No calcifications. No dissection. Aortic atherosclerosis noted.   Main Pulmonary Artery: Severely dilated main pulmonary artery 54 mm.   Aortic Valve:  Tri-leaflet.  No calcifications.   Coronary Arteries:  Normal coronary origin.  Left dominance.   Coronary Calcium Score:   Left main: 0   Left anterior descending artery: 46   Left circumflex artery: 0   Right coronary artery: 0   Total: 46   Percentile: 77th for age, sex, and race matched control.   RCA is a small non-dominant artery that gives rise to  PDA and PLA. There is no significant plaque.   Left main is a large artery that gives rise to LAD, ramus intermedius, and LCX arteries. There is no significant plaque.   LAD is a large vessel that gives rise to one large D1 Branch. There is minimal non-obstructive calcified plaque (< 25%) in the proximal and mid LAD.   LCX is a non-dominant artery that gives rise to one large OM1 branch. 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 There is no significant plaque. Notable misregistration artifact (as below) prominent in the mid vessel.   Ramus intermedius has no significant plaque (small vessel).   Other findings:   Normal variant pulmonary vein drainage into the left atrium (LUPV and LLPV fuse to a common ostium).   - RSPV 21 mm X 18 mm   - RLPV: 14 mm X 14 mm (variable sizing through systolic and diastolic phases argues against stenosis)   - LCPV: 26 mm X 20 mm   Normal left atrial appendage without a thrombus.   Misregistration artifact related to patient motion noted.   Severely dilated IVC 20 mm   At least mild right ventricular dilation; study not optimized for right ventricular imaging.   PFO and small atrial septal aneurysm noted.   Extra-cardiac findings: See attached radiology report for non-cardiac structures.   IMPRESSION: 1. Coronary calcium score of 46. This was 77th percentile for age, sex, and race matched control.   2. Normal coronary origin with left dominance.   3. Aortic atherosclerosis noted.   4. CAD-RADS 1. Minimal non-obstructive CAD (1-24%). Consider non-atherosclerotic causes of chest pain. Consider preventive therapy and risk factor modification.   5. Severely dilated main pulmonary artery 54 mm.   6.  Study not-consistent with ostial pulmonary vein stenosis.   7. PFO and small atrial septal aneurysm noted.   8. 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     Exercise Sestamibi stress test 11/06/2020: Exercise nuclear stress test was performed using Bruce protocol. Patient reached 7.8 METS, and 83% of age predicted maximum heart rate. Exercise capacity was fair. No chest pain reported. Heart rate and hemodynamic response were normal. Stress EKG revealed no ischemic changes. SPECT images show very small sized, mild intensity, partially reversible perfusion defect in basal inferolateral myocardium. Stress LVEF 65%. Low risk study.   Echo 10/30/2020: Left ventricle cavity is normal in size. Mild concentric hypertrophy of  the left ventricle. Normal global wall motion. Normal LV systolic function  with EF 60%. Doppler evidence of grade II (pseudonormal)  diastolic  dysfunction, elevated LAP.  Mild (Grade I) aortic regurgitation.  Mild tricuspid regurgitation.  No evidence of pulmonary hypertension.  No significant change compared to previous study on 08/02/2019.   EKG:  04/27/2021: Sinus rhythm. Rate 78 bpm. PACs  Recent Labs: 10/30/2020: TSH 2.070 12/15/2020: NT-Pro BNP 86 04/27/2021: BUN 20; Creatinine, Ser 1.01; Potassium 4.7; Sodium 141  Recent Lipid Panel    Component Value Date/Time   CHOL 174 10/30/2020 1417   TRIG 93 10/30/2020 1417   HDL 57 10/30/2020 1417   CHOLHDL 3.1 10/30/2020 1417   LDLCALC 100 (H) 10/30/2020 1417     Risk Assessment/Calculations:          Physical Exam:    VS:  BP 120/80 (BP Location: Left Arm, Patient Position: Sitting, Cuff Size: Normal)   Ht 5\' 5"  (1.651 m)   Wt 189 lb (85.7 kg)   BMI 31.45 kg/m     Wt Readings from Last 3 Encounters:  05/09/21 189 lb (85.7 kg)  04/27/21 188 lb (85.3 kg)  02/09/21 187 lb (84.8 kg)     GEN:  Well nourished, well developed in no acute distress HEENT: Normal NECK: No JVD; No carotid bruits LYMPHATICS: No lymphadenopathy CARDIAC: RRR, no murmurs, rubs, gallops RESPIRATORY:  Clear to  auscultation without rales, wheezing or rhonchi  ABDOMEN: Soft, non-tender, non-distended MUSCULOSKELETAL:  No edema; No deformity  SKIN: Warm and dry NEUROLOGIC:  Alert and oriented x 3 PSYCHIATRIC:  Normal affect   ASSESSMENT:    1. Pre-procedure lab exam   2. Pulmonary artery aneurysm (HCC)   3. Elevated coronary artery calcium score   4. Primary hypertension   5. OSA on CPAP   6. Mixed hyperlipidemia   7. Shortness of breath    PLAN:    In order of problems listed above:  Pulmonary artery aneurysm (HCC) In review of literature, isolated pulmonary artery aneurysm has an incidence of about 1 and 14,000.  From case studies, clinical monitoring was performed as surgical intervention risk outweigh the benefits.  Further discussions with other interventionalists/noninvasive cardiologist as well. Upon personal review of CT scan as well, I do not see any obvious evidence of thrombus burden in the pulmonary artery tree.  Reassuring.  She does not have any large shunts or large fistulas to the pulmonary artery. - I think it makes sense for [name] to check her pulmonary artery pressure with right heart catheterization.  Given her minimal left-sided disease, mild coronary artery disease seen on CT scan, I do not feel strongly that a left heart catheterization is necessary unless it is felt that this is a necessary part of the work-up for her hemodynamic evaluation.  She did not have any evidence of constriction or restriction on prior echocardiogram.  Elevated coronary artery calcium score Mild nonobstructive disease noted on coronary CT scan.  Previously has tried atorvastatin as well as Crestor.  Higher doses than 10 mg seem to upset her stomach.  She seems to be tolerating the pravastatin 40 mg well.  Ultimately, I would like to see her LDL less than 70.  We will continue to monitor.  Could always consider PCSK9 inhibitor if necessary in the future.  Primary hypertension Overall good blood  pressure control today.  She did show me her blood pressure readings some of which are in the 140s to 150 range occasionally.  Continue to monitor.  No evidence of hypotension.  No evidence of RV failure on echocardiogram.  Currently on spironolactone.  No  changes in medical management.  OSA on CPAP Treated with CPAP.  Pulmonary evaluation was performed as well.  PFTs were unremarkable.  They are going to monitor pulmonary nodules.  Mixed hyperlipidemia As scribed above.  Continue with Pravachol.  Unable to tolerate higher doses of Crestor or atorvastatin.   Right heart cath was straight in the [employer] he knows yes discussed in detail with Ms. Rodgers and she is willing to proceed.    Medication Adjustments/Labs and Tests Ordered: Current medicines are reviewed at length with the patient today.  Concerns regarding medicines are outlined above.  Orders Placed This Encounter  Procedures   CBC   Basic metabolic panel    No orders of the defined types were placed in this encounter.   Patient Instructions  Medication Instructions:  The current medical regimen is effective;  continue present plan and medications.  *If you need a refill on your cardiac medications before your next appointment, please call your pharmacy*  Lab Work: Please have blood work today (CBC, BMP) If you have labs (blood work) drawn today and your tests are completely normal, you will receive your results only by: MyChart Message (if you have [place]) OR A paper copy in the mail If you have any lab test that is abnormal or we need to change your treatment, we will call you to review the results.   Testing/Procedures:   [place] OFFICE [place], [place] [address] Dept: [phone] Loc: [place]  05/09/2021  You are scheduled for a Cardiac Catheterization on Tuesday, October 25  with Dr. Larae Grooms.  1. Please arrive at the [facility] (Main Entrance A) at [facility]: [place],  [address] at 7:00 AM (two hours before your procedure to ensure your preparation). Free valet parking service is available.   Special note: Every effort is made to have your procedure done on time. Please understand that emergencies sometimes delay scheduled procedures.  2. Diet: nothing after midnight the night before  3. Labs:  today  4. Medication instructions in preparation for your procedure:  May hold spironolactone this AM.  On the morning of your procedure, take your  and any morning medicines NOT listed above.  You may use sips of water.  5. Plan for one night stay--bring personal belongings. 6. Bring a current list of your medications and current insurance cards. 7. You MUST have a responsible person to drive you home. 8. Someone MUST be with you the first 24 hours after you arrive home or your discharge will be delayed. 9. Please wear clothes that are easy to get on and off and wear slip-on shoes.  Thank you for allowing [name] to care for you!   -- [place] [facility]  Follow-Up: At [facility], you and your health needs are our priority.  As part of our continuing mission to provide you with exceptional heart care, we have created designated Provider Care Teams.  These Care Teams include your primary Cardiologist (physician) and Advanced Practice Providers (APPs -  Physician Assistants and Nurse Practitioners) who all work together to provide you with the care you need, when you need it.  We recommend signing up for the patient portal called "MyChart".  Sign up information is provided on this After Visit Summary.  MyChart is used to connect with patients for Virtual Visits (Telemedicine).  Patients are able  to view lab/test results, encounter notes, upcoming appointments, etc.  Non-urgent messages can be sent to  your provider as well.   To learn more about what you can do with MyChart, go to [email or web address].    Your next appointment:   3 month(s)  The format for your next appointment:   In Person  Provider:   Candee Furbish, MD  Thank you for choosing [facility]!!     Signed, Candee Furbish, MD  05/09/2021 10:19 AM    [place]

## 2021-05-09 NOTE — Assessment & Plan Note (Signed)
Treated with CPAP.  Pulmonary evaluation was performed as well.  PFTs were unremarkable.  They are going to monitor pulmonary nodules.

## 2021-05-09 NOTE — Progress Notes (Signed)
Cardiology Office Note:    Date:  05/09/2021   ID:  [name], DOB 28-Jul-1956, MRN [id number]  PCP:  Prince Solian, MD   [facility] Providers Cardiologist:  Candee Furbish, MD     Referring MD: Prince Solian, MD     History of Present Illness:    [name] is a 65 y.o. 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  CT scan below.  Symptoms have been fairly longstanding shortness of breath following her ablation in 2019.  No evidence of pulmonary vein stenosis.  Pulmonary visit unremarkable.  Minimal coronary artery disease nonobstructive on CT.  Had lengthy discussion with she and her husband present.  Showed images of CT scan.  Past Medical History:  Diagnosis Date   Anxiety    Daytime somnolence 04/06/2013   DDD (degenerative disc disease), lumbar    Depression    Hypercholesteremia    Hypertension    OSA on CPAP 04/06/2013   Sleep apnea    Urinary incontinence     Past Surgical History:  Procedure Laterality Date   ABLATION  09/29/2017   BACK SURGERY  04/06/2019   BREAST EXCISIONAL BIOPSY Left    ELECTROPHYSIOLOGIC STUDY N/A 01/01/2016   Procedure: SVT Ablation;  Surgeon: Evans Lance, MD;  Location: [place] [facility];  Service: Cardiovascular;  Laterality: N/A;   HAND SURGERY Right    KNEE ARTHROPLASTY Left    NASAL SEPTUM SURGERY     RADICAL HYSTERECTOMY  2000   TONSILLECTOMY AND ADENOIDECTOMY      Current Medications: Current Meds  Medication Sig   aspirin EC 81 MG tablet Take 1 tablet (81 mg total) by mouth daily. Swallow whole.   cetirizine (ZYRTEC) 10 MG tablet Take 10 mg by mouth daily.   fluticasone (FLONASE) 50 MCG/ACT nasal spray Place 1 spray into both nostrils daily as needed for allergies.    pravastatin (PRAVACHOL) 40 MG tablet Take 1 tablet (40 mg total) by mouth every evening.   spironolactone (ALDACTONE) 50 MG tablet Take 1 tablet (50 mg total) by mouth daily.   vortioxetine HBr (TRINTELLIX) 20 MG TABS tablet Take  10 mg by mouth daily.     Allergies:   Patient has no known allergies.   Social History   Socioeconomic History   Marital status: Married    Spouse name: Not on file   Number of children: 2   Years of education: Not on file   Highest education level: Not on file  Occupational History   Not on file  Tobacco Use   Smoking status: Never   Smokeless tobacco: Never  Vaping Use   Vaping Use: Never used  Substance and Sexual Activity   Alcohol use: Not Currently    Alcohol/week: 0.0 standard drinks   Drug use: No   Sexual activity: Yes    Partners: Male    Birth control/protection: Surgical    Comment: hysterectomy  Other Topics Concern   Not on file  Social History Narrative   Not on file   Social Determinants of Health   Financial Resource Strain: Not on file  Food Insecurity: Not on file  Transportation Needs: Not on file  Physical Activity: Not on file  Stress: Not on file  Social Connections: Not on file     Family History: The patient's family history includes Diabetes in her maternal grandmother; Heart disease in her father; Hyperlipidemia in her father and mother; Hypertension in her father; Stroke in her father.  ROS:   Please see the history of present illness.     All other systems reviewed and are negative.  EKGs/Labs/Other Studies Reviewed:    The following studies were reviewed today:  Coronary CT scan 05/04/2021: Aorta: Normal size. No calcifications. No dissection. Aortic atherosclerosis noted.   Main Pulmonary Artery: Severely dilated main pulmonary artery 54 mm.   Aortic Valve:  Tri-leaflet.  No calcifications.   Coronary Arteries:  Normal coronary origin.  Left dominance.   Coronary Calcium Score:   Left main: 0   Left anterior descending artery: 46   Left circumflex artery: 0   Right coronary artery: 0   Total: 46   Percentile: 77th for age, sex, and race matched control.   RCA is a small non-dominant artery that gives rise to  PDA and PLA. There is no significant plaque.   Left main is a large artery that gives rise to LAD, ramus intermedius, and LCX arteries. There is no significant plaque.   LAD is a large vessel that gives rise to one large D1 Branch. There is minimal non-obstructive calcified plaque (< 25%) in the proximal and mid LAD.   LCX is a non-dominant artery that gives rise to one large OM1 branch. 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 There is no significant plaque. Notable misregistration artifact (as below) prominent in the mid vessel.   Ramus intermedius has no significant plaque (small vessel).   Other findings:   Normal variant pulmonary vein drainage into the left atrium (LUPV and LLPV fuse to a common ostium).   - RSPV 21 mm X 18 mm   - RLPV: 14 mm X 14 mm (variable sizing through systolic and diastolic phases argues against stenosis)   - LCPV: 26 mm X 20 mm   Normal left atrial appendage without a thrombus.   Misregistration artifact related to patient motion noted.   Severely dilated IVC 20 mm   At least mild right ventricular dilation; study not optimized for right ventricular imaging.   PFO and small atrial septal aneurysm noted.   Extra-cardiac findings: See attached radiology report for non-cardiac structures.   IMPRESSION: 1. Coronary calcium score of 46. This was 77th percentile for age, sex, and race matched control.   2. Normal coronary origin with left dominance.   3. Aortic atherosclerosis noted.   4. CAD-RADS 1. Minimal non-obstructive CAD (1-24%). Consider non-atherosclerotic causes of chest pain. Consider preventive therapy and risk factor modification.   5. Severely dilated main pulmonary artery 54 mm.   6.  Study not-consistent with ostial pulmonary vein stenosis.   7. PFO and small atrial septal aneurysm noted.   8. 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     Exercise Sestamibi stress test 11/06/2020: Exercise nuclear stress test was performed using Bruce protocol. Patient reached 7.8 METS, and 83% of age predicted maximum heart rate. Exercise capacity was fair. No chest pain reported. Heart rate and hemodynamic response were normal. Stress EKG revealed no ischemic changes. SPECT images show very small sized, mild intensity, partially reversible perfusion defect in basal inferolateral myocardium. Stress LVEF 65%. Low risk study.   Echo 10/30/2020: Left ventricle cavity is normal in size. Mild concentric hypertrophy of  the left ventricle. Normal global wall motion. Normal LV systolic function  with EF 60%. Doppler evidence of grade II (pseudonormal)  diastolic  dysfunction, elevated LAP.  Mild (Grade I) aortic regurgitation.  Mild tricuspid regurgitation.  No evidence of pulmonary hypertension.  No significant change compared to previous study on 08/02/2019.   EKG:  04/27/2021: Sinus rhythm. Rate 78 bpm. PACs  Recent Labs: 10/30/2020: TSH 2.070 12/15/2020: NT-Pro BNP 86 04/27/2021: BUN 20; Creatinine, Ser 1.01; Potassium 4.7; Sodium 141  Recent Lipid Panel    Component Value Date/Time   CHOL 174 10/30/2020 1417   TRIG 93 10/30/2020 1417   HDL 57 10/30/2020 1417   CHOLHDL 3.1 10/30/2020 1417   LDLCALC 100 (H) 10/30/2020 1417     Risk Assessment/Calculations:          Physical Exam:    VS:  BP 120/80 (BP Location: Left Arm, Patient Position: Sitting, Cuff Size: Normal)   Ht 5\' 5"  (1.651 m)   Wt 189 lb (85.7 kg)   BMI 31.45 kg/m     Wt Readings from Last 3 Encounters:  05/09/21 189 lb (85.7 kg)  04/27/21 188 lb (85.3 kg)  02/09/21 187 lb (84.8 kg)     GEN:  Well nourished, well developed in no acute distress HEENT: Normal NECK: No JVD; No carotid bruits LYMPHATICS: No lymphadenopathy CARDIAC: RRR, no murmurs, rubs, gallops RESPIRATORY:  Clear to  auscultation without rales, wheezing or rhonchi  ABDOMEN: Soft, non-tender, non-distended MUSCULOSKELETAL:  No edema; No deformity  SKIN: Warm and dry NEUROLOGIC:  Alert and oriented x 3 PSYCHIATRIC:  Normal affect   ASSESSMENT:    1. Pre-procedure lab exam   2. Pulmonary artery aneurysm (HCC)   3. Elevated coronary artery calcium score   4. Primary hypertension   5. OSA on CPAP   6. Mixed hyperlipidemia   7. Shortness of breath    PLAN:    In order of problems listed above:  Pulmonary artery aneurysm (HCC) In review of literature, isolated pulmonary artery aneurysm has an incidence of about 1 and 14,000.  From case studies, clinical monitoring was performed as surgical intervention risk outweigh the benefits.  Further discussions with other interventionalists/noninvasive cardiologist as well. Upon personal review of CT scan as well, I do not see any obvious evidence of thrombus burden in the pulmonary artery tree.  Reassuring.  She does not have any large shunts or large fistulas to the pulmonary artery. - I think it makes sense for [name] to check her pulmonary artery pressure with right heart catheterization.  Given her minimal left-sided disease, mild coronary artery disease seen on CT scan, I do not feel strongly that a left heart catheterization is necessary unless it is felt that this is a necessary part of the work-up for her hemodynamic evaluation.  She did not have any evidence of constriction or restriction on prior echocardiogram.  Elevated coronary artery calcium score Mild nonobstructive disease noted on coronary CT scan.  Previously has tried atorvastatin as well as Crestor.  Higher doses than 10 mg seem to upset her stomach.  She seems to be tolerating the pravastatin 40 mg well.  Ultimately, I would like to see her LDL less than 70.  We will continue to monitor.  Could always consider PCSK9 inhibitor if necessary in the future.  Primary hypertension Overall good blood  pressure control today.  She did show me her blood pressure readings some of which are in the 140s to 150 range occasionally.  Continue to monitor.  No evidence of hypotension.  No evidence of RV failure on echocardiogram.  Currently on spironolactone.  No  changes in medical management.  OSA on CPAP Treated with CPAP.  Pulmonary evaluation was performed as well.  PFTs were unremarkable.  They are going to monitor pulmonary nodules.  Mixed hyperlipidemia As scribed above.  Continue with Pravachol.  Unable to tolerate higher doses of Crestor or atorvastatin.   Right heart cath was straight in the [employer] he knows yes discussed in detail with Ms. Conery and she is willing to proceed.    Medication Adjustments/Labs and Tests Ordered: Current medicines are reviewed at length with the patient today.  Concerns regarding medicines are outlined above.  Orders Placed This Encounter  Procedures   CBC   Basic metabolic panel    No orders of the defined types were placed in this encounter.   Patient Instructions  Medication Instructions:  The current medical regimen is effective;  continue present plan and medications.  *If you need a refill on your cardiac medications before your next appointment, please call your pharmacy*  Lab Work: Please have blood work today (CBC, BMP) If you have labs (blood work) drawn today and your tests are completely normal, you will receive your results only by: MyChart Message (if you have [place]) OR A paper copy in the mail If you have any lab test that is abnormal or we need to change your treatment, we will call you to review the results.   Testing/Procedures:   [facility] [place], [address] [place] [address] Dept: [phone] Loc: [place]  05/09/2021  You are scheduled for a Cardiac Catheterization on Tuesday, October 25  with Dr. Larae Grooms.  1. Please arrive at the [facility] (Main Entrance A) at [facility]: [place], [place] [address] at 7:00 AM (two hours before your procedure to ensure your preparation). Free valet parking service is available.   Special note: Every effort is made to have your procedure done on time. Please understand that emergencies sometimes delay scheduled procedures.  2. Diet: nothing after midnight the night before  3. Labs:  today  4. Medication instructions in preparation for your procedure:  May hold spironolactone this AM.  On the morning of your procedure, take your  and any morning medicines NOT listed above.  You may use sips of water.  5. Plan for one night stay--bring personal belongings. 6. Bring a current list of your medications and current insurance cards. 7. You MUST have a responsible person to drive you home. 8. Someone MUST be with you the first 24 hours after you arrive home or your discharge will be delayed. 9. Please wear clothes that are easy to get on and off and wear slip-on shoes.  Thank you for allowing [name] to care for you!   -- [place] [facility]  Follow-Up: At [facility], you and your health needs are our priority.  As part of our continuing mission to provide you with exceptional heart care, we have created designated Provider Care Teams.  These Care Teams include your primary Cardiologist (physician) and Advanced Practice Providers (APPs -  Physician Assistants and Nurse Practitioners) who all work together to provide you with the care you need, when you need it.  We recommend signing up for the patient portal called "MyChart".  Sign up information is provided on this After Visit Summary.  MyChart is used to connect with patients for Virtual Visits (Telemedicine).  Patients are able  to view lab/test results, encounter notes, upcoming appointments, etc.  Non-urgent messages can be sent to  your provider as well.   To learn more about what you can do with MyChart, go to [email or web address].    Your next appointment:   3 month(s)  The format for your next appointment:   In Person  Provider:   Candee Furbish, MD  Thank you for choosing [facility]!!     Signed, Candee Furbish, MD  05/09/2021 10:19 AM    [place]

## 2021-05-09 NOTE — Telephone Encounter (Signed)
She was called to schedule a CT scan of her chest, but Dr. Marlou Porch didn't mention anything about.  Is that what he requested?

## 2021-05-09 NOTE — Assessment & Plan Note (Signed)
In review of literature, isolated pulmonary artery aneurysm has an incidence of about 1 and 14,000.  From case studies, clinical monitoring was performed as surgical intervention risk outweigh the benefits.  Further discussions with other interventionalists/noninvasive cardiologist as well. Upon personal review of CT scan as well, I do not see any obvious evidence of thrombus burden in the pulmonary artery tree.  Reassuring.  She does not have any large shunts or large fistulas to the pulmonary artery. - I think it makes sense for [name] to check her pulmonary artery pressure with right heart catheterization.  Given her minimal left-sided disease, mild coronary artery disease seen on CT scan, I do not feel strongly that a left heart catheterization is necessary unless it is felt that this is a necessary part of the work-up for her hemodynamic evaluation.  She did not have any evidence of constriction or restriction on prior echocardiogram.

## 2021-05-10 ENCOUNTER — Other Ambulatory Visit: Payer: Self-pay | Admitting: Cardiology

## 2021-05-10 DIAGNOSIS — R931 Abnormal findings on diagnostic imaging of heart and coronary circulation: Secondary | ICD-10-CM

## 2021-05-10 DIAGNOSIS — E782 Mixed hyperlipidemia: Secondary | ICD-10-CM

## 2021-05-14 ENCOUNTER — Ambulatory Visit: Payer: 59 | Admitting: Cardiology

## 2021-05-21 ENCOUNTER — Telehealth: Payer: Self-pay | Admitting: *Deleted

## 2021-05-21 NOTE — Telephone Encounter (Signed)
Right Heart Catheterization scheduled at [facility] for: Tuesday May 22, 2021 [facility] Main Entrance A [facility]) at: 7 AM   No solid food after midnight prior to cath, clear liquids until 5 AM day of procedure.  Hold: Spironolactone-AM of procedure  Except hold medication usual morning medications can be taken pre-cath with sips of water.    Confirmed patient has responsible adult to drive home post procedure and be with patient first 24 hours after arriving home.  [facility] does allow one visitor to accompany you and wait in the hospital waiting room while you are there for your procedure. You and your visitor will be asked to wear a mask once you enter the hospital.   Patient reports does not currently have any new symptoms concerning for COVID-19 and no household members with COVID-19 like illness.    Reviewed procedure/mask/visitor instructions with patient.

## 2021-05-22 ENCOUNTER — Ambulatory Visit (HOSPITAL_COMMUNITY)
Admission: RE | Disposition: A | Discharge status: Home / Self Care | Payer: Self-pay | Source: Home / Self Care | Attending: Interventional Cardiology

## 2021-05-22 ENCOUNTER — Ambulatory Visit (HOSPITAL_COMMUNITY)
Admission: RE | Admit: 2021-05-22 | Discharge: 2021-05-22 | Disposition: A | Discharge status: Home / Self Care | Payer: 59 | Attending: Interventional Cardiology | Admitting: Interventional Cardiology

## 2021-05-22 ENCOUNTER — Encounter (HOSPITAL_COMMUNITY): Payer: Self-pay | Admitting: Cardiology

## 2021-05-22 DIAGNOSIS — I2510 Atherosclerotic heart disease of native coronary artery without angina pectoris: Secondary | ICD-10-CM | POA: Insufficient documentation

## 2021-05-22 DIAGNOSIS — Z79899 Other long term (current) drug therapy: Secondary | ICD-10-CM | POA: Diagnosis not present

## 2021-05-22 DIAGNOSIS — R0602 Shortness of breath: Secondary | ICD-10-CM | POA: Diagnosis not present

## 2021-05-22 DIAGNOSIS — I10 Essential (primary) hypertension: Secondary | ICD-10-CM | POA: Diagnosis present

## 2021-05-22 DIAGNOSIS — Z7982 Long term (current) use of aspirin: Secondary | ICD-10-CM | POA: Diagnosis not present

## 2021-05-22 DIAGNOSIS — Z9989 Dependence on other enabling machines and devices: Secondary | ICD-10-CM

## 2021-05-22 DIAGNOSIS — G4733 Obstructive sleep apnea (adult) (pediatric): Secondary | ICD-10-CM | POA: Diagnosis not present

## 2021-05-22 DIAGNOSIS — E782 Mixed hyperlipidemia: Secondary | ICD-10-CM | POA: Diagnosis not present

## 2021-05-22 DIAGNOSIS — R931 Abnormal findings on diagnostic imaging of heart and coronary circulation: Secondary | ICD-10-CM | POA: Diagnosis present

## 2021-05-22 DIAGNOSIS — I281 Aneurysm of pulmonary artery: Secondary | ICD-10-CM | POA: Insufficient documentation

## 2021-05-22 HISTORY — PX: RIGHT HEART CATH: CATH118263

## 2021-05-22 LAB — POCT I-STAT EG7
Acid-Base Excess: 0 mmol/L (ref 0.0–2.0)
Acid-base deficit: 2 mmol/L (ref 0.0–2.0)
Bicarbonate: 25.1 mmol/L (ref 20.0–28.0)
Bicarbonate: 26 mmol/L (ref 20.0–28.0)
Calcium, Ion: 1.34 mmol/L (ref 1.15–1.40)
Calcium, Ion: 1.34 mmol/L (ref 1.15–1.40)
HCT: 48 % — ABNORMAL HIGH (ref 36.0–46.0)
HCT: 48 % — ABNORMAL HIGH (ref 36.0–46.0)
Hemoglobin: 16.3 g/dL — ABNORMAL HIGH (ref 12.0–15.0)
Hemoglobin: 16.3 g/dL — ABNORMAL HIGH (ref 12.0–15.0)
O2 Saturation: 70 %
O2 Saturation: 72 %
Potassium: 4.5 mmol/L (ref 3.5–5.1)
Potassium: 4.5 mmol/L (ref 3.5–5.1)
Sodium: 140 mmol/L (ref 135–145)
Sodium: 140 mmol/L (ref 135–145)
TCO2: 27 mmol/L (ref 22–32)
TCO2: 27 mmol/L (ref 22–32)
pCO2, Ven: 47.8 mmHg (ref 44.0–60.0)
pCO2, Ven: 48.2 mmHg (ref 44.0–60.0)
pH, Ven: 7.325 (ref 7.250–7.430)
pH, Ven: 7.344 (ref 7.250–7.430)
pO2, Ven: 39 mmHg (ref 32.0–45.0)
pO2, Ven: 41 mmHg (ref 32.0–45.0)

## 2021-05-22 SURGERY — RIGHT HEART CATH

## 2021-05-22 MED ORDER — SODIUM CHLORIDE 0.9% FLUSH: 3.0000 mL | INTRAVENOUS | Status: DC | PRN

## 2021-05-22 MED ORDER — FENTANYL CITRATE (PF) 100 MCG/2ML IJ SOLN
INTRAMUSCULAR | Status: AC
  Filled 2021-05-22: qty 2

## 2021-05-22 MED ORDER — FENTANYL CITRATE (PF) 100 MCG/2ML IJ SOLN
INTRAMUSCULAR | Status: DC | PRN
  Administered 2021-05-22: 25 ug via INTRAVENOUS

## 2021-05-22 MED ORDER — HEPARIN (PORCINE) IN NACL 1000-0.9 UT/500ML-% IV SOLN
INTRAVENOUS | Status: DC | PRN
  Administered 2021-05-22: 500 mL

## 2021-05-22 MED ORDER — LIDOCAINE HCL (PF) 1 % IJ SOLN
INTRAMUSCULAR | Status: DC | PRN
  Administered 2021-05-22: 2 mL

## 2021-05-22 MED ORDER — SODIUM CHLORIDE 0.9 % WEIGHT BASED INFUSION: 1.0000 mL/kg/h | INTRAVENOUS | Status: AC

## 2021-05-22 MED ORDER — SODIUM CHLORIDE 0.9 % IV SOLN: 250.0000 mL | INTRAVENOUS | Status: DC | PRN

## 2021-05-22 MED ORDER — HEPARIN (PORCINE) IN NACL 1000-0.9 UT/500ML-% IV SOLN
INTRAVENOUS | Status: AC
  Filled 2021-05-22: qty 1000

## 2021-05-22 MED ORDER — ASPIRIN 81 MG PO CHEW
CHEWABLE_TABLET | ORAL | Status: AC
  Administered 2021-05-22: 81 mg
  Filled 2021-05-22: qty 1

## 2021-05-22 MED ORDER — MIDAZOLAM HCL 2 MG/2ML IJ SOLN
INTRAMUSCULAR | Status: DC | PRN
  Administered 2021-05-22: 2 mg via INTRAVENOUS

## 2021-05-22 MED ORDER — ACETAMINOPHEN 325 MG PO TABS: 650.0000 mg | ORAL_TABLET | ORAL | Status: DC | PRN

## 2021-05-22 MED ORDER — SODIUM CHLORIDE 0.9% FLUSH: 3.0000 mL | Freq: Two times a day (BID) | INTRAVENOUS | Status: DC

## 2021-05-22 MED ORDER — LIDOCAINE HCL (PF) 1 % IJ SOLN
INTRAMUSCULAR | Status: AC
  Filled 2021-05-22: qty 30

## 2021-05-22 MED ORDER — SODIUM CHLORIDE 0.9 % IV SOLN: INTRAVENOUS | Status: DC

## 2021-05-22 MED ORDER — MIDAZOLAM HCL 2 MG/2ML IJ SOLN
INTRAMUSCULAR | Status: AC
  Filled 2021-05-22: qty 2

## 2021-05-22 MED ORDER — ONDANSETRON HCL 4 MG/2ML IJ SOLN: 4.0000 mg | Freq: Four times a day (QID) | INTRAMUSCULAR | Status: DC | PRN

## 2021-05-22 SURGICAL SUPPLY — 8 items
CATH BALLN WEDGE 5F 110CM (CATHETERS) ×2 IMPLANT
PACK CARDIAC CATHETERIZATION (CUSTOM PROCEDURE TRAY) ×2 IMPLANT
PROTECTION STATION PRESSURIZED (MISCELLANEOUS) ×2
SHEATH GLIDE SLENDER 4/5FR (SHEATH) ×2 IMPLANT
STATION PROTECTION PRESSURIZED (MISCELLANEOUS) ×1 IMPLANT
TRANSDUCER W/STOPCOCK (MISCELLANEOUS) ×2 IMPLANT
TUBING ART PRESS 72  MALE/FEM (TUBING) ×2
TUBING ART PRESS 72 MALE/FEM (TUBING) ×1 IMPLANT

## 2021-05-22 NOTE — Interval H&P Note (Signed)
History and Physical Interval Note:  05/22/2021 8:33 AM  [name]  has presented today for surgery, with the diagnosis of shortness of breath.  The various methods of treatment have been discussed with the patient and family. After consideration of risks, benefits and other options for treatment, the patient has consented to  Procedure(s): RIGHT HEART CATH (N/A) as a surgical intervention.  The patient's history has been reviewed, patient examined, no change in status, stable for surgery.  I have reviewed the patient's chart and labs.  Questions were answered to the patient's satisfaction.     Collier Salina [facility] 05/22/2021 8:33 AM

## 2021-05-22 NOTE — Progress Notes (Signed)
Pt verbalized her discontent with the fact that her primary cardiologist wanted her to be seen by Dr Irish Lack and she had a different Dr do her [place]. She stated that the cath lab team said she would meet dr Martinique and either she could proceed or reschedule. She felt compelled to proceed despite her cardiologist wanting her to have Dr Irish Lack.  During post care she stated she did not have discharge instructions, they were printed and received and the patient were told no driving for 24 hours due to medications and everything else returns to normal. I returned and told her that she should take it easy due to the medications and could shower this evening if she wanted to. Pt verbalized understanding.

## 2021-05-23 MED FILL — Heparin Sod (Porcine)-NaCl IV Soln 1000 Unit/500ML-0.9%: INTRAVENOUS | Qty: 500 | Status: AC

## 2021-07-12 ENCOUNTER — Other Ambulatory Visit: Payer: Self-pay | Admitting: Cardiology

## 2021-07-12 DIAGNOSIS — I10 Essential (primary) hypertension: Secondary | ICD-10-CM

## 2021-07-12 DIAGNOSIS — R0609 Other forms of dyspnea: Secondary | ICD-10-CM

## 2021-08-23 ENCOUNTER — Encounter: Payer: Self-pay | Admitting: Neurology

## 2021-08-23 ENCOUNTER — Ambulatory Visit: Payer: 59 | Admitting: Neurology

## 2021-08-23 VITALS — BP 127/74 | HR 55 | Ht 66.0 in | Wt 192.8 lb

## 2021-08-23 DIAGNOSIS — G4733 Obstructive sleep apnea (adult) (pediatric): Secondary | ICD-10-CM | POA: Diagnosis not present

## 2021-08-23 DIAGNOSIS — G4719 Other hypersomnia: Secondary | ICD-10-CM

## 2021-08-23 DIAGNOSIS — E669 Obesity, unspecified: Secondary | ICD-10-CM | POA: Diagnosis not present

## 2021-08-23 DIAGNOSIS — Z9989 Dependence on other enabling machines and devices: Secondary | ICD-10-CM | POA: Diagnosis not present

## 2021-08-23 NOTE — Patient Instructions (Signed)
It was nice to see you again today.  As discussed, I will order a home sleep test to reevaluate your sleep apnea and to qualify you for a new CPAP machine or AutoPap.  Based on your test results, we will call you and initiate an order for a new machine through your DME company.  In the meantime, please continue using your current CPAP and I will write for orders for new supplies and a new mask.

## 2021-08-23 NOTE — Progress Notes (Signed)
Subjective:    Patient ID: [name] is a 66 y.o. female.  HPI    Star Age, MD, PhD [facility] [address]O. [place], [place] [address]  Dear Dr. Dagmar Hait,   I saw your patient, [name], upon your kind request, in my Sleep clinic today for reevaluation of her obstructive sleep apnea.  The patient is unaccompanied today.  As you know, [name] is a 66 year old right-handed woman with an underlying medical history of degenerative lumbar disc disease, depression, anxiety, hyperlipidemia, hypertension, and mild obesity, who has been previously diagnosed with sleep apnea.  She has been on CPAP therapy.  She reports that she was not consistent with her CPAP usage but in the past 3 months she got back to using her machine regularly.  I was able to review her compliance download for the past 90 days from 05/26/2021 through 08/23/2021, she is over 90% compliant for days on treatment with no more than 4 hours.  Average usage of 7 hours and 55 minutes, residual AHI 1.5/h, leak acceptable with a 95th percentile at 7.3 L/min, pressure of 6 cm with EPR.  She uses a small N 20 nasal mask.  She needs new supplies.  She may be eligible for a new machine.  She does not always wake up rested and has daytime tiredness.  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  She is a non-smoker does not utilize any alcohol, does not drink caffeine daily.  Her bedtime is generally around midnight and rise time between 9 and 9:30 AM.  She lives with her husband.  She denies night to night nocturia or recurrent morning headaches.  Her Epworth sleepiness score is 10 out of 24, fatigue severity score is 52 out of 63.  She is followed by her psychiatry nurse practitioner, he is on Trintellix, she is currently not on Strattera although she has a prescription filled for it.  It caused her constipation so she is not actually using it now. I reviewed  your office note from 05/18/2021. She has not been seen in this clinic since 2017.  Previously:   05/28/16: 66 year old right-handed woman with an underlying medical history of hypertension, OCD and mood disorder who presents for follow-up consultation of her obstructive sleep apnea, after her recent sleep study. The patient is unaccompanied today. I last saw her on 12/13/2015, at which time she was referred by her cardiologist for reevaluation. I had seen her prior to that in September 2014 after sleep studies. At her visit on 12/13/2015 she reported that she had stopped using CPAP. She used it perhaps for a year. She also has suffered from low back pain and was not able to exercise. She had some changes to her medications through her psychiatrist. She was no longer on Depakote or Lamictal. She was on Vyvanse and Trintellix. She had gained weight since her original sleep study, about 15 pounds. She was more tired during the day. I suggested she return for full night CPAP re-titration study. She had this on 02/02/2016. Sleep efficiency was 84.6%, sleep latency was 13.5 minutes and wake after sleep onset was 57.5 minutes with moderate to severe sleep fragmentation noted. She had an increased percentage of stage I sleep, an increased percentage of slow-wave sleep and a decreased percentage of REM sleep with a markedly prolonged REM latency of 257.5 minutes. She had no significant PLMS, she had no significant EKG  changes. Average heart rate however was 44. Average oxygen saturation was 93%, nadir was 88%. CPAP was started on 5 cm and increase to 6 cm in which AHI was 0 per hour. Nonsupine REM sleep was achieved on the final pressure. Based on her test results are prescribed CPAP therapy for home use.   05/28/2016: I reviewed her CPAP compliance data from 04/27/2016 through 05/26/2016 which is a total of 30 days, during which time she used her CPAP 28 days with percent used days greater than 4 hours and 83%,  indicating very good compliance with an average usage of 6 hours and 34 minutes, residual AHI 2.8 per hour, leak low for the 95th percentile at 1.8 L/m on a pressure of 6 cm with EPR.    She reports doing okay, has adjusted fairly well, but has a tendency to clench her teeth at night.  In March 2017 she developed SVT and saw cardiology after going to the emergency room. She was in the process of being evaluated for an ablation procedure. She had an interim SVT ablation procedure on 01/01/2016 and I reviewed the records. In the interim, she presented to the emergency room on 01/17/2016 with recurrence of tachycardia, was diagnosed with recurrence of SVT. Symptoms improved with adenosine. She may be seeking a second opinion at [facility], she is looking into it. Stable mood. May need to switch to another psych provider.      04/06/2013: She presents for follow-up consultation after a recent set of sleep studies. sI had originally met her at the request of her psychiatrist on 12/10/2012, at which time she reported a prior sleep study some 15 years ago but no diagnosis of obstructive sleep apnea. She has recently been experiencing worsening daytime somnolence and snoring. Her baseline sleep study and her CPAP titration study were discussed. Her baseline sleep study was from 12/24/2012 and demonstrated a total AHI of 11 per hour, supine AHI of 23.3 per hour and a REM related AHI of 30 per hour with an oxyhemoglobin desaturation nadir of 81%. Based on those test results I requested that she come back for a CPAP titration study which she had on 01/14/2013. Sleep efficiency was reduced at 75.7% with a normal sleep latency. Wake after sleep onset was increased at 89 minutes with mild sleep fragmentation noted. Her arousal index was low. Her sleep architecture showed a normal percentage of stage I and stage II sleep, and he increased percentage of slow-wave sleep at 34.4% and a mildly decreased percentage of REM sleep at 14%  with a normal REM latency. She had no significant periodic leg movements. She had CPAP from 5 cm to 6 cm of pressure and had almost complete resolution of her sleep disordered breathing on 6 cm of water pressure with a medium nasal pillows mask. I reviewed compliance data from 03/01/2013 through 03/16/2013 which is a total of 15 days during which time she used it every day except for one day. Her percent use stays greater than 4 hours was 88% with an average usage for all days of 6 hours and 41 minutes. Her residual AHI was 6.5.  I also reviewed her compliance data from 03/01/13 to 03/31/13 which is a total of 29 days, during which time she used it every day except for 2 days. The average usage for all days was 5 hours and 56 minutes. The percent used days greater than 4 hours was 94%, indicating excellent compliance. The residual AHI was 4.3 per hour, indicating  an appropriate treatment pressure of 6 cwp. .   She goes to bed by MN and it takes her a long time to fall asleep. She has been on Lamictal with a recent increase, and Depakote and has started a new antidepressant. She has been on Dextrostat for the past 2 years prn. She takes 10 mg in the morning. She wakes up around 10 AM. She falls asleep around 4 PM and may take a 2 hour nap. She has not necessarily noted an improvement with CPAP, and indicates full compliance. She has occasional sinus congestion. She takes Flonase. She has not been using a nasal rinse system.  Her Past Medical History Is Significant For: Past Medical History:  Diagnosis Date   Anxiety    Daytime somnolence 04/06/2013   DDD (degenerative disc disease), lumbar    Depression    Hypercholesteremia    Hypertension    OSA on CPAP 04/06/2013   Sleep apnea    Urinary incontinence     Her Past Surgical History Is Significant For: Past Surgical History:  Procedure Laterality Date   ABLATION  09/29/2017   BACK SURGERY  04/06/2019   BREAST EXCISIONAL BIOPSY Left     ELECTROPHYSIOLOGIC STUDY N/A 01/01/2016   Procedure: SVT Ablation;  Surgeon: Evans Lance, MD;  Location: [facility];  Service: Cardiovascular;  Laterality: N/A;   HAND SURGERY Right    KNEE ARTHROPLASTY Left    NASAL SEPTUM SURGERY     RADICAL HYSTERECTOMY  2000   RIGHT HEART CATH N/A 05/22/2021   Procedure: RIGHT HEART CATH;  Surgeon: Martinique, Peter M, MD;  Location: [facility];  Service: Cardiovascular;  Laterality: N/A;   TONSILLECTOMY AND ADENOIDECTOMY      Her Family History Is Significant For: Family History  Problem Relation Age of Onset   Hyperlipidemia Mother    Heart disease Father    Hypertension Father    Stroke Father    Hyperlipidemia Father    Diabetes Maternal Grandmother    Sleep apnea Neg Hx     Her Social History Is Significant For: Social History   Socioeconomic History   Marital status: Married    Spouse name: Not on file   Number of children: 2   Years of education: Not on file   Highest education level: Not on file  Occupational History   Not on file  Tobacco Use   Smoking status: Never   Smokeless tobacco: Never  Vaping Use   Vaping Use: Never used  Substance and Sexual Activity   Alcohol use: Not Currently    Alcohol/week: 0.0 standard drinks   Drug use: No   Sexual activity: Yes    Partners: Male    Birth control/protection: Surgical    Comment: hysterectomy  Other Topics Concern   Not on file  Social History Narrative   Not on file   Social Determinants of Health   Financial Resource Strain: Not on file  Food Insecurity: Not on file  Transportation Needs: Not on file  Physical Activity: Not on file  Stress: Not on file  Social Connections: Not on file    Her Allergies Are:  No Known Allergies:   Her Current Medications Are:  Outpatient Encounter Medications as of 08/23/2021  Medication Sig   aspirin EC 81 MG tablet Take 1 tablet (81 mg total) by mouth daily. Swallow whole.   cetirizine (ZYRTEC) 10 MG tablet  Take 10 mg by mouth in the morning.  Cholecalciferol (VITAMIN D3) 50 MCG (2000 UT) TABS Take 2,000 Units by mouth in the morning.   fluticasone (FLONASE) 50 MCG/ACT nasal spray Place 2 sprays into both nostrils daily as needed for allergies.   hydrocortisone cream 1 % Apply 1 application topically 2 (two) times daily as needed (skin irritation/itching/rash).   pravastatin (PRAVACHOL) 40 MG tablet TAKE 1 TABLET BY MOUTH EVERY DAY IN THE EVENING   spironolactone (ALDACTONE) 50 MG tablet TAKE 1 TABLET BY MOUTH EVERY DAY   vortioxetine HBr (TRINTELLIX) 20 MG TABS tablet Take 10 mg by mouth in the morning.   No facility-administered encounter medications on file as of 08/23/2021.  :   Review of Systems:  Out of a complete 14 point review of systems, all are reviewed and negative with the exception of these symptoms as listed below:   Review of Systems  Neurological:        Pt states she is here for sleep consult. Pt states she snores and has headaches throughout the day. Pt states she has current CPAP for at least 3 years   FSS:52 ESS:10   Objective:  Neurological Exam  Physical Exam Physical Examination:   Vitals:   08/23/21 1124  BP: 127/74  Pulse: (!) 55   General Examination: The patient is a very pleasant 66 y.o. female in no acute distress. She appears well-developed and well-nourished and well groomed.   HEENT: Normocephalic, atraumatic, pupils are equal, round and reactive to light, extraocular tracking is good without limitation to gaze excursion or nystagmus noted. Hearing is grossly intact. Face is symmetric with normal facial animation. Speech is clear with no dysarthria noted. There is no hypophonia. There is no lip, neck/head, jaw or voice tremor. Neck is supple with full range of passive and active motion. There are no carotid bruits on auscultation. Oropharynx exam reveals: mild mouth dryness, good dental hygiene and mild airway crowding, Mallampati is class II. Tongue  protrudes centrally and palate elevates symmetrically. Neck size is 15 three-quarter inches.   Chest: Clear to auscultation without wheezing, rhonchi or crackles noted.  Heart: S1+S2+0, regular and normal without murmurs, rubs or gallops noted.   Abdomen: Soft, non-tender and non-distended with normal bowel sounds appreciated on auscultation.  Extremities: There is no pitting edema in the distal lower extremities bilaterally.   Skin: Warm and dry without trophic changes noted.   Musculoskeletal: exam reveals no obvious joint deformities, tenderness or joint swelling or erythema.   Neurologically:  Mental status: The patient is awake, alert and oriented in all 4 spheres. Her immediate and remote memory, attention, language skills and fund of knowledge are appropriate. There is no evidence of aphasia, agnosia, apraxia or anomia. Speech is clear with normal prosody and enunciation. Thought process is linear. Mood is normal and affect is normal.  Cranial nerves II - XII are as described above under HEENT exam.  Motor exam: Normal bulk, strength and tone is noted. There is no tremor. Fine motor skills and coordination: grossly intact.  Cerebellar testing: No dysmetria or intention tremor. There is no truncal or gait ataxia.  Sensory exam: intact to light touch in the upper and lower extremities.  Gait, station and balance: She stands easily. No veering to one side is noted. No leaning to one side is noted. Posture is age-appropriate and stance is narrow based. Gait shows normal stride length and normal pace. No problems turning are noted.   Assessment and Plan:  In summary, [name] is a very  pleasant 66 y.o.-year old female with an underlying medical history of degenerative lumbar disc disease, depression, anxiety, hyperlipidemia, hypertension, and mild obesity, who presents for reevaluation of her obstructive sleep apnea.  She was originally diagnosed with sleep apnea in 2014.  She has  been on CPAP therapy, she did not consistently use her CPAP machine but recently restarted it.  She was encouraged by her cardiologist to be consistent with her CPAP.  She is compliant with it and commended for it.  She is advised to continue with her current machine.  She is advised that she should be eligible for new machine.  We will reevaluate with a home sleep test and I would like to renew supplies for now.  She is advised to do the home sleep test without the CPAP in place so we can get diagnostic data.  I explained the home sleep test procedure to her.  We will send the order for supplies to her previous DME company and once we have the home sleep test results, we will share this with her by phone call and I will initiate an order for a new machine.  She is advised to be consistent with her equipment and once she is established on her new machine, she will need a compliance follow-up within 1 to 3 months.  She is advised to pursue healthy lifestyle, good nutrition, good hydration, enough time for sleep, and stress management.  She does feel like she is tired during the day, she has been retired from the past year and often finds herself not motivated enough to do something physical, she does sleep during the day.  She is advised to continue to follow-up with her mental health provider.  We will plan to follow-up after the sleep study.  I answered all her questions today and she was in agreement.  Thank you very much for allowing me to participate in the care of this nice patient. If I can be of any further assistance to you please do not hesitate to call me at [phone].  Sincerely,   Star Age, MD, PhD

## 2021-08-24 ENCOUNTER — Encounter: Payer: Self-pay | Admitting: Cardiology

## 2021-08-24 ENCOUNTER — Other Ambulatory Visit: Payer: Self-pay

## 2021-08-24 ENCOUNTER — Ambulatory Visit: Payer: 59 | Admitting: Cardiology

## 2021-08-24 VITALS — BP 140/80 | HR 57 | Ht 66.0 in | Wt 194.0 lb

## 2021-08-24 DIAGNOSIS — I2510 Atherosclerotic heart disease of native coronary artery without angina pectoris: Secondary | ICD-10-CM | POA: Insufficient documentation

## 2021-08-24 DIAGNOSIS — E782 Mixed hyperlipidemia: Secondary | ICD-10-CM | POA: Diagnosis not present

## 2021-08-24 DIAGNOSIS — I281 Aneurysm of pulmonary artery: Secondary | ICD-10-CM | POA: Diagnosis not present

## 2021-08-24 NOTE — Progress Notes (Signed)
Cardiology Office Note:    Date:  08/24/2021   ID:  [name], DOB April 25, 1956, MRN [id number]  PCP:  Prince Solian, MD   [facility] Providers Cardiologist:  Candee Furbish, MD     Referring MD: Prince Solian, MD    History of Present Illness:    [name] is a 66 y.o. female here for follow-up of isolated pulmonary artery aneurysm.  Right heart cath showed no evidence of pulmonary hypertension.  No evidence of thrombus burden on CT.   Symptoms have been fairly longstanding shortness of breath following her ablation in 2019.  No evidence of pulmonary vein stenosis.  Pulmonary visit unremarkable.  Minimal coronary artery disease nonobstructive on CT.   Had lengthy discussion with she and her husband present.  Showed images of CT scan.  Had prior SVT ablation 2017, Dr. Lovena Le.  Has obstructive sleep apnea on CPAP.  Still has some shortness of breath with heavy activity.  No chest pain no syncope no fevers chills nausea vomiting.    Past Medical History:  Diagnosis Date   Anxiety    Daytime somnolence 04/06/2013   DDD (degenerative disc disease), lumbar    Depression    Hypercholesteremia    Hypertension    OSA on CPAP 04/06/2013   Sleep apnea    Urinary incontinence     Past Surgical History:  Procedure Laterality Date   ABLATION  09/29/2017   BACK SURGERY  04/06/2019   BREAST EXCISIONAL BIOPSY Left    ELECTROPHYSIOLOGIC STUDY N/A 01/01/2016   Procedure: SVT Ablation;  Surgeon: Evans Lance, MD;  Location: [place] [facility];  Service: Cardiovascular;  Laterality: N/A;   HAND SURGERY Right    KNEE ARTHROPLASTY Left    NASAL SEPTUM SURGERY     RADICAL HYSTERECTOMY  2000   RIGHT HEART CATH N/A 05/22/2021   Procedure: RIGHT HEART CATH;  Surgeon: Martinique, Peter M, MD;  Location: [place] [facility];  Service: Cardiovascular;  Laterality: N/A;   TONSILLECTOMY AND ADENOIDECTOMY      Current Medications: Current Meds  Medication Sig   aspirin EC 81  MG tablet Take 1 tablet (81 mg total) by mouth daily. Swallow whole.   cetirizine (ZYRTEC) 10 MG tablet Take 10 mg by mouth in the morning.   Cholecalciferol (VITAMIN D3) 50 MCG (2000 UT) TABS Take 2,000 Units by mouth in the morning.   fluticasone (FLONASE) 50 MCG/ACT nasal spray Place 2 sprays into both nostrils daily as needed for allergies.   hydrocortisone cream 1 % Apply 1 application topically 2 (two) times daily as needed (skin irritation/itching/rash).   pravastatin (PRAVACHOL) 40 MG tablet TAKE 1 TABLET BY MOUTH EVERY DAY IN THE EVENING   spironolactone (ALDACTONE) 50 MG tablet TAKE 1 TABLET BY MOUTH EVERY DAY   vortioxetine HBr (TRINTELLIX) 10 MG TABS tablet Take 10 mg by mouth in the morning.     Allergies:   Patient has no known allergies.   Social History   Socioeconomic History   Marital status: Married    Spouse name: Not on file   Number of children: 2   Years of education: Not on file   Highest education level: Not on file  Occupational History   Not on file  Tobacco Use   Smoking status: Never   Smokeless tobacco: Never  Vaping Use   Vaping Use: Never used  Substance and Sexual Activity   Alcohol use: Not Currently    Alcohol/week: 0.0 standard drinks  Drug use: No   Sexual activity: Yes    Partners: Male    Birth control/protection: Surgical    Comment: hysterectomy  Other Topics Concern   Not on file  Social History Narrative   Not on file   Social Determinants of Health   Financial Resource Strain: Not on file  Food Insecurity: Not on file  Transportation Needs: Not on file  Physical Activity: Not on file  Stress: Not on file  Social Connections: Not on file     Family History: The patient's family history includes Diabetes in her maternal grandmother; Heart disease in her father; Hyperlipidemia in her father and mother; Hypertension in her father; Stroke in her father. There is no history of Sleep apnea.  ROS:   Please see the history of  present illness.     All other systems reviewed and are negative.  EKGs/Labs/Other Studies Reviewed:    The following studies were reviewed today: Coronary CT scan 05/04/2021: Aorta: Normal size. No calcifications. No dissection. Aortic atherosclerosis noted.   Main Pulmonary Artery: Severely dilated main pulmonary artery 54 mm.   Aortic Valve:  Tri-leaflet.  No calcifications.   Coronary Arteries:  Normal coronary origin.  Left dominance.   Coronary Calcium Score:   Left main: 0   Left anterior descending artery: 46   Left circumflex artery: 0   Right coronary artery: 0   Total: 46   Percentile: 77th for age, sex, and race matched control.   RCA is a small non-dominant artery that gives rise to PDA and PLA. There is no significant plaque.   Left main is a large artery that gives rise to LAD, ramus intermedius, and LCX arteries. There is no significant plaque.   LAD is a large vessel that gives rise to one large D1 Branch. There is minimal non-obstructive calcified plaque (< 25%) in the proximal and mid LAD.   LCX is a non-dominant artery that gives rise to one large OM1 branch. 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 There is no significant plaque. Notable misregistration artifact (as below) prominent in the mid vessel.   Ramus intermedius has no significant plaque (small vessel).   Other findings:   Normal variant pulmonary vein drainage into the left atrium (LUPV and LLPV fuse to a common ostium).   - RSPV 21 mm X 18 mm   - RLPV: 14 mm X 14 mm (variable sizing through systolic and diastolic phases argues against stenosis)   - LCPV: 26 mm X 20 mm   Normal left atrial appendage without a thrombus.   Misregistration artifact related to patient motion noted.   Severely dilated IVC 20 mm   At least mild right ventricular dilation; study not optimized for right ventricular  imaging.   PFO and small atrial septal aneurysm noted.   Extra-cardiac findings: See attached radiology report for non-cardiac structures.   IMPRESSION: 1. Coronary calcium score of 46. This was 77th percentile for age, sex, and race matched control.   2. Normal coronary origin with left dominance.   3. Aortic atherosclerosis noted.   4. CAD-RADS 1. Minimal non-obstructive CAD (1-24%). Consider non-atherosclerotic causes of chest pain. Consider preventive therapy and risk factor modification.   5. Severely dilated main pulmonary artery 54 mm.   6.  Study not-consistent with ostial pulmonary vein stenosis.   7. PFO and small atrial septal aneurysm noted.   8. 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     Exercise Sestamibi stress test 11/06/2020: Exercise nuclear stress test was performed using Bruce protocol. Patient reached 7.8 METS, and 83% of age predicted maximum heart rate. Exercise capacity was fair. No chest pain reported. Heart rate and hemodynamic response were normal. Stress EKG revealed no ischemic changes. SPECT images show very small sized, mild intensity, partially reversible perfusion defect in basal inferolateral myocardium. Stress LVEF 65%. Low risk study.   Echo 10/30/2020: Left ventricle cavity is normal in size. Mild concentric hypertrophy of  the left ventricle. Normal global wall motion. Normal LV systolic function  with EF 60%. Doppler evidence of grade II (pseudonormal) diastolic  dysfunction, elevated LAP.  Mild (Grade I) aortic regurgitation.  Mild tricuspid regurgitation.  No evidence of pulmonary hypertension.  No significant change compared to previous study on 08/02/2019.    Recent Labs: 10/30/2020: TSH 2.070 12/15/2020: NT-Pro BNP 86 05/09/2021: BUN 16; Creatinine, Ser 1.04; Platelets 211 05/22/2021: Hemoglobin 16.3; Hemoglobin 16.3; Potassium 4.5; Potassium 4.5; Sodium  140; Sodium 140  Recent Lipid Panel    Component Value Date/Time   CHOL 174 10/30/2020 1417   TRIG 93 10/30/2020 1417   HDL 57 10/30/2020 1417   CHOLHDL 3.1 10/30/2020 1417   LDLCALC 100 (H) 10/30/2020 1417     Risk Assessment/Calculations:              Physical Exam:    VS:  BP 140/80 (BP Location: Left Arm, Patient Position: Sitting, Cuff Size: Normal)    Pulse (!) 57    Ht 5\' 6"  (1.676 m)    Wt 194 lb (88 kg)    SpO2 94%    BMI 31.31 kg/m     Wt Readings from Last 3 Encounters:  08/24/21 194 lb (88 kg)  08/23/21 192 lb 12.8 oz (87.5 kg)  05/22/21 190 lb (86.2 kg)     GEN:  Well nourished, well developed in no acute distress HEENT: Normal NECK: No JVD; No carotid bruits LYMPHATICS: No lymphadenopathy CARDIAC: RRR, no murmurs, no rubs, gallops RESPIRATORY:  Clear to auscultation without rales, wheezing or rhonchi  ABDOMEN: Soft, non-tender, non-distended MUSCULOSKELETAL:  No edema; No deformity  SKIN: Warm and dry NEUROLOGIC:  Alert and oriented x 3 PSYCHIATRIC:  Normal affect   ASSESSMENT:    1. Mixed hyperlipidemia   2. Pulmonary artery aneurysm ([place])   3. Coronary artery disease involving native coronary artery of native heart without angina pectoris    PLAN:    In order of problems listed above:  Pulmonary artery aneurysm (HCC) Pulmonary artery aneurysm isolated has an incidence of 1 and 14,000 from case studies.  Clinical monitoring was performed as surgical intervention risk outweighed the benefits.  I had several discussions with interventional list noninvasive cardiologist as well.  Her pulmonary pressures were normal on right heart catheterization.  Appreciate this.  CT scan showed only mild plaque of LAD.  Continue with exercise.  Surveillance.  Avoid excessive Valsalva maneuver.  Coronary artery disease involving native coronary artery of native heart without angina pectoris Mild less than 25% plaque in the LAD.  Calcified.  She is on pravastatin 40  mg and her LDL is 83.  I would like for her to talk with the lipid clinic here to potentially start PCSK9 inhibitor.  She has had trouble in the past with Crestor and atorvastatin causing abdominal discomfort and bloating.  I would like  her LDL to be less than 70.  Mixed hyperlipidemia Asking lipid clinic to see her.  Question PCSK9.         Medication Adjustments/Labs and Tests Ordered: Current medicines are reviewed at length with the patient today.  Concerns regarding medicines are outlined above.  Orders Placed This Encounter  Procedures   AMB Referral to [facility]   No orders of the defined types were placed in this encounter.   Patient Instructions  Medication Instructions:  The current medical regimen is effective;  continue present plan and medications.  *If you need a refill on your cardiac medications before your next appointment, please call your pharmacy*  You have been referred to out Lipid Clinic here at this office.  Follow-Up: At [facility], you and your health needs are our priority.  As part of our continuing mission to provide you with exceptional heart care, we have created designated Provider Care Teams.  These Care Teams include your primary Cardiologist (physician) and Advanced Practice Providers (APPs -  Physician Assistants and Nurse Practitioners) who all work together to provide you with the care you need, when you need it.  We recommend signing up for the patient portal called "MyChart".  Sign up information is provided on this After Visit Summary.  MyChart is used to connect with patients for Virtual Visits (Telemedicine).  Patients are able to view lab/test results, encounter notes, upcoming appointments, etc.  Non-urgent messages can be sent to your provider as well.   To learn more about what you can do with MyChart, go to [email or web address].    Your next appointment:   6 month(s)  The format for your next appointment:   In  Person  Provider:   Candee Furbish, MD       Signed, Candee Furbish, MD  08/24/2021 10:51 AM    [place]

## 2021-08-24 NOTE — Assessment & Plan Note (Signed)
Mild less than 25% plaque in the LAD.  Calcified.  She is on pravastatin 40 mg and her LDL is 83.  I would like for her to talk with the lipid clinic here to potentially start PCSK9 inhibitor.  She has had trouble in the past with Crestor and atorvastatin causing abdominal discomfort and bloating.  I would like her LDL to be less than 70.

## 2021-08-24 NOTE — Assessment & Plan Note (Signed)
Asking lipid clinic to see her.  Question PCSK9.

## 2021-08-24 NOTE — Patient Instructions (Signed)
Medication Instructions:  The current medical regimen is effective;  continue present plan and medications.  *If you need a refill on your cardiac medications before your next appointment, please call your pharmacy*  You have been referred to out Lipid Clinic here at this office.  Follow-Up: At [facility], you and your health needs are our priority.  As part of our continuing mission to provide you with exceptional heart care, we have created designated Provider Care Teams.  These Care Teams include your primary Cardiologist (physician) and Advanced Practice Providers (APPs -  Physician Assistants and Nurse Practitioners) who all work together to provide you with the care you need, when you need it.  We recommend signing up for the patient portal called "MyChart".  Sign up information is provided on this After Visit Summary.  MyChart is used to connect with patients for Virtual Visits (Telemedicine).  Patients are able to view lab/test results, encounter notes, upcoming appointments, etc.  Non-urgent messages can be sent to your provider as well.   To learn more about what you can do with MyChart, go to [email or web address].    Your next appointment:   6 month(s)  The format for your next appointment:   In Person  Provider:   Candee Furbish, MD

## 2021-08-24 NOTE — Assessment & Plan Note (Signed)
Pulmonary artery aneurysm isolated has an incidence of 1 and 14,000 from case studies.  Clinical monitoring was performed as surgical intervention risk outweighed the benefits.  I had several discussions with interventional list noninvasive cardiologist as well.  Her pulmonary pressures were normal on right heart catheterization.  Appreciate this.  CT scan showed only mild plaque of LAD.  Continue with exercise.  Surveillance.  Avoid excessive Valsalva maneuver.

## 2021-08-28 ENCOUNTER — Telehealth: Payer: Self-pay

## 2021-08-28 NOTE — Telephone Encounter (Signed)
LVM for pt to call me back to schedule sleep study  

## 2021-08-30 NOTE — Progress Notes (Signed)
Denyse Amass, RN; Vanessa Ralphs got it!      Previous Messages   ----- Message -----  From: Brandon Melnick, RN  Sent: 08/28/2021  10:01 AM EST  To: Vanessa Ralphs, Marchelle Gearing, *  Subject: renew cpap supplies                             New orders Renew cpap supplies  Carmela Hurt. [place]  Female, 66 y.o., Dec 24, 1955  MRN:  [id number]  Thank you Newman Pies

## 2021-09-11 ENCOUNTER — Telehealth: Payer: Self-pay

## 2021-09-11 NOTE — Telephone Encounter (Signed)
LVM for pt to call me back to schedule sleep study  

## 2021-09-13 ENCOUNTER — Ambulatory Visit: Payer: 59 | Admitting: Pharmacist

## 2021-09-13 ENCOUNTER — Other Ambulatory Visit: Payer: Self-pay

## 2021-09-13 DIAGNOSIS — R931 Abnormal findings on diagnostic imaging of heart and coronary circulation: Secondary | ICD-10-CM | POA: Diagnosis not present

## 2021-09-13 DIAGNOSIS — E782 Mixed hyperlipidemia: Secondary | ICD-10-CM | POA: Diagnosis not present

## 2021-09-13 MED ORDER — EZETIMIBE 10 MG PO TABS: 10.0000 mg | ORAL_TABLET | Freq: Every day | ORAL | 3 refills | Status: DC

## 2021-09-13 NOTE — Patient Instructions (Addendum)
It was nice to meet you today!  Your LDL cholesterol is 83 and your goal is < 70  Start ezetimibe (Zetia) 10mg  1 tablet daily  Continue taking your other medications  Recheck fasting cholesterol in 2 months. Come in fasting on Wednesday, April 19th any time after 7:30am  Call Jinny Blossom, PharmD with any trouble tolerating your Zetia [phone]

## 2021-09-13 NOTE — Progress Notes (Signed)
Patient ID: [name]                 DOB: 04-03-1956                    MRN: [id number]     HPI: COLETTA LOCKNER is a 66 y.o. female patient referred to lipid clinic by Dr Marlou Porch. PMH is significant for pulmonary artery aneurysm with SOB following ablation in 2019, normal pulmonary pressures on RHC, OSA on CPAP, HTN, and HLD. Coronary CT 04/2021 showed elevated calcium score of 46 (77th percentile). Prior calcium score with PCP 10/2020 was a bit higher at 141 (88th percentile).  Pt presents today for follow up. Previously tried rosuvastatin 10mg  daily. Experienced extreme stomach pain and bloating for 3 weeks. Stopped rosuvastatin and 4 weeks later started atorvastatin 10mg  daily. Tolerated it well but when her dose was increased to 20mg  daily, she experienced similar stomach issues. She's been taking pravastatin since July of 2022 and tolerating well.  Current Medications: pravastatin 40mg  daily Intolerances: atorvastatin 10mg  daily, rosuvastatin 10mg  daily - abdominal bloating and discomfort Risk Factors: elevated CAC, HTN LDL goal: 70mg /dL  Diet: snacks on cashews, chocolate, potato chips. [employer] sandwiches,   Exercise: line dances 6 days a week, weights at the gym  Family History: Diabetes in her maternal grandmother; Heart disease in her father; Hyperlipidemia in her father and mother; Hypertension in her father; Stroke in her father  Social History: Denies tobacco, alcohol, and drug use.  Labs: 06/13/21: TC 152, TG 81, HDL 53, nonHDL 99, LDL 83 (pravastatin 40mg  daily)  Past Medical History:  Diagnosis Date   Anxiety    Daytime somnolence 04/06/2013   DDD (degenerative disc disease), lumbar    Depression    Hypercholesteremia    Hypertension    OSA on CPAP 04/06/2013   Sleep apnea    Urinary incontinence     Current Outpatient Medications on File Prior to Visit  Medication Sig Dispense Refill   aspirin EC 81 MG tablet Take 1 tablet (81 mg total) by mouth daily.  Swallow whole. 90 tablet 3   cetirizine (ZYRTEC) 10 MG tablet Take 10 mg by mouth in the morning.     Cholecalciferol (VITAMIN D3) 50 MCG (2000 UT) TABS Take 2,000 Units by mouth in the morning.     fluticasone (FLONASE) 50 MCG/ACT nasal spray Place 2 sprays into both nostrils daily as needed for allergies.     hydrocortisone cream 1 % Apply 1 application topically 2 (two) times daily as needed (skin irritation/itching/rash).     pravastatin (PRAVACHOL) 40 MG tablet TAKE 1 TABLET BY MOUTH EVERY DAY IN THE EVENING 90 tablet 1   spironolactone (ALDACTONE) 50 MG tablet TAKE 1 TABLET BY MOUTH EVERY DAY 90 tablet 1   vortioxetine HBr (TRINTELLIX) 10 MG TABS tablet Take 10 mg by mouth in the morning.     No current facility-administered medications on file prior to visit.    No Known Allergies  Assessment/Plan:  1. Hyperlipidemia - LDL 83mg /dL on pravastatin 40mg  daily, above goal < 70 given elevated CAC. Discussed adding ezetimibe or PCSK9i therapy as increasing pravastatin to 80mg  daily would lower LDL an additional 6% LDL and not bring her to goal. She prefers to try adding on ezetimibe 10mg  daily. Rx has been sent to pharmacy and she will continue on pravastatin 40mg  daily. Reviewed heart healthy diet today. She will call clinic with any concerns, otherwise will recheck fasting labs in  2 months.  Jerran Tappan E. Rogelio Winbush, PharmD, BCACP, [place] [address] [place], [place] [address] Phone: [phone]; Fax: [phone] 09/13/2021 2:02 PM

## 2021-09-19 ENCOUNTER — Encounter: Payer: Self-pay | Admitting: Pharmacist

## 2021-09-20 ENCOUNTER — Telehealth: Payer: Self-pay

## 2021-09-20 NOTE — Telephone Encounter (Signed)
We have attempted to call the patient 2 times to schedule sleep study. Patient has been unavailable at the phone numbers we have on file and has not returned our calls. If patient calls back we will schedule them for their sleep study. ° °

## 2021-10-03 ENCOUNTER — Ambulatory Visit (INDEPENDENT_AMBULATORY_CARE_PROVIDER_SITE_OTHER): Payer: 59 | Admitting: Neurology

## 2021-10-03 DIAGNOSIS — G4733 Obstructive sleep apnea (adult) (pediatric): Secondary | ICD-10-CM | POA: Diagnosis not present

## 2021-10-03 DIAGNOSIS — E669 Obesity, unspecified: Secondary | ICD-10-CM

## 2021-10-03 DIAGNOSIS — G4719 Other hypersomnia: Secondary | ICD-10-CM

## 2021-10-04 NOTE — Progress Notes (Unsigned)
° ° ° ° ° ° ° ° ° ° ° ° ° ° ° ° ° ° ° ° ° ° °

## 2021-10-12 NOTE — Procedures (Signed)
? ?  [facility] ? ?HOME SLEEP TEST (Watch PAT) REPORT ? ?STUDY DATE: 10/03/2021 ? ?DOB: [birth year] ? ?MRN: [id number] ? ?ORDERING CLINICIAN: Star Age, MD, PhD ?  ?REFERRING CLINICIAN: Prince Solian, MD  ? ?CLINICAL INFORMATION/HISTORY:  66 year old right-handed woman with an underlying medical history of degenerative lumbar disc disease, depression, anxiety, hyperlipidemia, hypertension, and mild obesity, who was previously diagnosed with sleep apnea.  She has been on CPAP therapy.  She presents for reevaluation. ? ?Epworth sleepiness score: 10/24. ? ?BMI: 31.2 kg/m? ? ?FINDINGS:  ? ?Sleep Summary:  ? ?Total Recording Time (hours, min): 9 hours, 24 minutes ? ?Total Sleep Time (hours, min):  8 hours, 45 minutes  ? ?Percent REM (%):    27.3%  ? ?Respiratory Indices:  ? ?Calculated pAHI (per hour):  9.4/hour        ? ?REM pAHI:    23.7/hour      ? ?NREM pAHI: 4.1/hour ? ?Oxygen Saturation Statistics:  ?  ?Oxygen Saturation (%) Mean: 94%  ? ?Minimum oxygen saturation (%):                 89%  ? ?O2 Saturation Range (%): 89-99%   ? ?O2 Saturation (minutes) <=88%: 0 min ? ?Pulse Rate Statistics:  ? ?Pulse Mean (bpm):    67/min   ? ?Pulse Range (38-93/min)  ? ?IMPRESSION: OSA (obstructive sleep apnea), mild ? ?RECOMMENDATION:  ?This home sleep test demonstrates overall mild obstructive sleep apnea with a total AHI of 9.4/hour and O2 nadir of 89%.  Snoring was fairly consistent in the mild to moderate range, at times louder.  Given the patient's medical history and sleep related complaints, ongoing treatment with positive airway pressure is recommended.  The patient should applied for a new machine.  She will be advised to start AutoPap therapy with new equipment.  Alternative treatment options may include a dental device versus weight loss and avoidance of the supine sleep position.  These alternatives will be discussed with the patient.   ?Please note that untreated obstructive sleep apnea may carry  additional perioperative morbidity. Patients with significant obstructive sleep apnea should receive perioperative PAP therapy and the surgeons and particularly the anesthesiologist should be informed of the diagnosis and the severity of the sleep disordered breathing. ?The patient should be cautioned not to drive, work at heights, or operate dangerous or heavy equipment when tired or sleepy. Review and reiteration of good sleep hygiene measures should be pursued with any patient. ?Other causes of the patient's symptoms, including circadian rhythm disturbances, an underlying mood disorder, medication effect and/or an underlying medical problem cannot be ruled out based on this test. Clinical correlation is recommended.  ? ?The patient and her referring provider will be notified of the test results. The patient will be seen in follow up in sleep clinic at [facility]. ? ?I certify that I have reviewed the raw data recording prior to the issuance of this report in accordance with the standards of the American Academy of Sleep Medicine (AASM). ? ?INTERPRETING PHYSICIAN:  ? ?Star Age, MD, PhD  ?Board Certified in Neurology and Sleep Medicine ? ?[facility] ?[address], [address] ?[place], [place] [address] ?([phone] ? ? ? ? ? ? ? ? ? ? ? ? ? ? ? ? ? ? ?

## 2021-10-12 NOTE — Addendum Note (Signed)
Addended by: Star Age on: 10/12/2021 12:38 PM ? ? Modules accepted: Orders ? ?

## 2021-10-15 ENCOUNTER — Telehealth: Payer: Self-pay | Admitting: *Deleted

## 2021-10-15 NOTE — Telephone Encounter (Signed)
-----   Message from Star Age, MD sent at 10/12/2021 12:38 PM EDT ----- ?Patient referred by Dr. Dagmar Hait for reevaluation of her OSA.  I saw her on 08/23/2021.  She was not always consistent with her previous CPAP.  She would qualify for new equipment if she would like to maintain treatment with a CPAP or similar machine such as AutoPap.  Her home sleep test from 10/03/2021 indicated mild obstructive sleep apnea with fairly consistent mild to moderate snoring detected.  If she would like to consider dental treatment with an oral appliance, we can make a referral to dentistry for evaluation of her candidacy for a dental device.  She did report snoring and headaches throughout the day when I saw her in January.  As I understand, her cardiologist encouraged her to be on her CPAP machine consistently.  I went ahead and placed an order for a new AutoPap machine.  Let me know if she is agreeable to maintaining treatment with a PAP machine  ?Please reinforce the importance of compliance; we will need a FU in sleep clinic for 10 weeks post-PAP set up, please arrange that with me or one of our NPs. Thanks,  ? ?Star Age, MD, PhD ?[facility]) ? ? ? ? ?

## 2021-10-15 NOTE — Telephone Encounter (Signed)
LVM for patient to call me back to go over sleep study results .  ?

## 2021-10-18 NOTE — Telephone Encounter (Signed)
Patient returned phone call and we discussed her sleep study results.  The patient is amenable to proceeding with continued PAP therapy and this particular order will be for an AutoPap machine.  We discussed the difference between AutoPap and CPAP.  The patient's questions were answered regarding the oral device for snoring.  She did not wish to proceed at this time because she said the snoring is not bothersome to her husband when she is wearing the CPAP.  Patient would like to keep her care with [facility].  I advised the orders will be sent there and she should get a call within 1 week.  They will be able to answer her financial questions.  I provided her with the phone number for [facility] to call if she does not hear from them soon.  Patient was scheduled for an initial follow-up appointment on Wednesday, June 14 at 1:15 PM arrival 1:00.  Patient aware to bring her machine and power cord with her.  Patient also aware this appointment must follow-up within 31 and 90 days after set up and insurance will also be looking for her to use the machine at least 4 hours at night.  The patient's questions were answered and she verbalized appreciation for the call. ? ?Order sent to [facility] with [employer]. Result forwarded to PCP/referring MD.  ?

## 2021-10-18 NOTE — Telephone Encounter (Signed)
Called pt & LVM (ok per DPR) asking for call back to discuss SS results. Left office hours and phone number in message.  ?

## 2021-11-06 ENCOUNTER — Other Ambulatory Visit: Payer: Self-pay | Admitting: Cardiology

## 2021-11-06 DIAGNOSIS — E782 Mixed hyperlipidemia: Secondary | ICD-10-CM

## 2021-11-06 DIAGNOSIS — R931 Abnormal findings on diagnostic imaging of heart and coronary circulation: Secondary | ICD-10-CM

## 2021-11-14 ENCOUNTER — Other Ambulatory Visit: Payer: 59

## 2021-11-14 DIAGNOSIS — R931 Abnormal findings on diagnostic imaging of heart and coronary circulation: Secondary | ICD-10-CM

## 2021-11-14 DIAGNOSIS — E782 Mixed hyperlipidemia: Secondary | ICD-10-CM

## 2021-11-14 LAB — HEPATIC FUNCTION PANEL
ALT: 17 IU/L (ref 0–32)
AST: 21 IU/L (ref 0–40)
Albumin: 4.4 g/dL (ref 3.8–4.8)
Alkaline Phosphatase: 50 IU/L (ref 44–121)
Bilirubin Total: 0.6 mg/dL (ref 0.0–1.2)
Bilirubin, Direct: 0.15 mg/dL (ref 0.00–0.40)
Total Protein: 7.1 g/dL (ref 6.0–8.5)

## 2021-11-14 LAB — LIPID PANEL
Chol/HDL Ratio: 2.4 ratio (ref 0.0–4.4)
Cholesterol, Total: 130 mg/dL (ref 100–199)
HDL: 55 mg/dL (ref >39)
LDL Chol Calc (NIH): 57 mg/dL (ref 0–99)
Triglycerides: 97 mg/dL (ref 0–149)
VLDL Cholesterol Cal: 18 mg/dL (ref 5–40)

## 2021-11-21 ENCOUNTER — Encounter: Payer: Self-pay | Admitting: Pharmacist

## 2021-11-21 ENCOUNTER — Other Ambulatory Visit: Payer: Self-pay | Admitting: Pharmacist

## 2021-11-21 MED ORDER — EZETIMIBE 10 MG PO TABS: 5.0000 mg | ORAL_TABLET | Freq: Every day | ORAL | 3 refills | Status: DC

## 2021-11-28 ENCOUNTER — Ambulatory Visit
Admission: RE | Admit: 2021-11-28 | Discharge: 2021-11-28 | Disposition: A | Discharge status: Home / Self Care | Payer: 59 | Source: Ambulatory Visit | Attending: Pulmonary Disease | Admitting: Pulmonary Disease

## 2021-11-28 DIAGNOSIS — R918 Other nonspecific abnormal finding of lung field: Secondary | ICD-10-CM

## 2021-12-23 NOTE — Progress Notes (Signed)
Ty, please let patient know I have reviewed CT chest. Nodules stable. She will need a repeat CT chest wo contrast in 1 year.   Thanks,  BLI  Garner Nash, DO [facility] 12/23/2021 5:36 PM

## 2022-01-08 NOTE — Progress Notes (Unsigned)
Patient: [name] Date of Birth: [birth year]  Reason for Visit: Follow up History from: Patient Primary Neurologist:    ASSESSMENT AND PLAN 66 y.o. year old female    HISTORY OF PRESENT ILLNESS: Today 01/08/22 [name] is here today for follow-up.  HST 10/03/21 showed mild OSA with mild to moderate snoring.  New AutoPap CPAP was ordered.   HISTORY  Copied Dr. Rexene Alberts 08/03/21: I saw your patient, [name], upon your kind request, in my Sleep clinic today for reevaluation of her obstructive sleep apnea.  The patient is unaccompanied today.  As you know, Ms. [name] is a 66 year old right-handed woman with an underlying medical history of degenerative lumbar disc disease, depression, anxiety, hyperlipidemia, hypertension, and mild obesity, who has been previously diagnosed with sleep apnea.  She has been on CPAP therapy.  She reports that she was not consistent with her CPAP usage but in the past 3 months she got back to using her machine regularly.  I was able to review her compliance download for the past 90 days from 05/26/2021 through 08/23/2021, she is over 90% compliant for days on treatment with no more than 4 hours.  Average usage of 7 hours and 55 minutes, residual AHI 1.5/h, leak acceptable with a 95th percentile at 7.3 L/min, pressure of 6 cm with EPR.  She uses a small N 20 nasal mask.  She needs new supplies.  She may be eligible for a new machine.  She does not always wake up rested and has daytime tiredness.  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  She is a non-smoker does not utilize any alcohol, does not drink caffeine daily.  Her bedtime is generally around midnight and rise time between 9 and 9:30 AM.  She lives with her husband.  She denies night to night nocturia or recurrent morning headaches.  Her Epworth sleepiness score is 10 out of 24, fatigue severity score is 52 out of 63.  She is followed by her psychiatry nurse  practitioner, he is on Trintellix, she is currently not on Strattera although she has a prescription filled for it.  It caused her constipation so she is not actually using it now. I reviewed your office note from 05/18/2021. She has not been seen in this clinic since 2017.  REVIEW OF SYSTEMS: Out of a complete 14 system review of symptoms, the patient complains only of the following symptoms, and all other reviewed systems are negative.  See HPI  ALLERGIES: No Known Allergies  HOME MEDICATIONS: Outpatient Medications Prior to Visit  Medication Sig Dispense Refill   aspirin EC 81 MG tablet Take 1 tablet (81 mg total) by mouth daily. Swallow whole. 90 tablet 3   cetirizine (ZYRTEC) 10 MG tablet Take 10 mg by mouth in the morning.     Cholecalciferol (VITAMIN D3) 50 MCG (2000 UT) TABS Take 2,000 Units by mouth in the morning.     ezetimibe (ZETIA) 10 MG tablet Take 0.5 tablets (5 mg total) by mouth daily. 45 tablet 3   fluticasone (FLONASE) 50 MCG/ACT nasal spray Place 2 sprays into both nostrils daily as needed for allergies.     hydrocortisone cream 1 % Apply 1 application topically 2 (two) times daily as needed (skin irritation/itching/rash).     pravastatin (PRAVACHOL) 40 MG tablet TAKE 1 TABLET BY MOUTH EVERY DAY IN THE EVENING 90 tablet 1   spironolactone (ALDACTONE) 50 MG tablet TAKE  1 TABLET BY MOUTH EVERY DAY 90 tablet 1   vortioxetine HBr (TRINTELLIX) 10 MG TABS tablet Take 10 mg by mouth in the morning.     No facility-administered medications prior to visit.    PAST MEDICAL HISTORY: Past Medical History:  Diagnosis Date   Anxiety    Daytime somnolence 04/06/2013   DDD (degenerative disc disease), lumbar    Depression    Hypercholesteremia    Hypertension    OSA on CPAP 04/06/2013   Sleep apnea    Urinary incontinence     PAST SURGICAL HISTORY: Past Surgical History:  Procedure Laterality Date   ABLATION  09/29/2017   BACK SURGERY  04/06/2019   BREAST EXCISIONAL  BIOPSY Left    ELECTROPHYSIOLOGIC STUDY N/A 01/01/2016   Procedure: SVT Ablation;  Surgeon: Evans Lance, MD;  Location: [facility];  Service: Cardiovascular;  Laterality: N/A;   HAND SURGERY Right    KNEE ARTHROPLASTY Left    NASAL SEPTUM SURGERY     RADICAL HYSTERECTOMY  2000   RIGHT HEART CATH N/A 05/22/2021   Procedure: RIGHT HEART CATH;  Surgeon: Martinique, Peter M, MD;  Location: [facility];  Service: Cardiovascular;  Laterality: N/A;   TONSILLECTOMY AND ADENOIDECTOMY      FAMILY HISTORY: Family History  Problem Relation Age of Onset   Hyperlipidemia Mother    Heart disease Father    Hypertension Father    Stroke Father    Hyperlipidemia Father    Diabetes Maternal Grandmother    Sleep apnea Neg Hx     SOCIAL HISTORY: Social History   Socioeconomic History   Marital status: Married    Spouse name: Not on file   Number of children: 2   Years of education: Not on file   Highest education level: Not on file  Occupational History   Not on file  Tobacco Use   Smoking status: Never   Smokeless tobacco: Never  Vaping Use   Vaping Use: Never used  Substance and Sexual Activity   Alcohol use: Not Currently    Alcohol/week: 0.0 standard drinks of alcohol   Drug use: No   Sexual activity: Yes    Partners: Male    Birth control/protection: Surgical    Comment: hysterectomy  Other Topics Concern   Not on file  Social History Narrative   Not on file   Social Determinants of Health   Financial Resource Strain: Not on file  Food Insecurity: Not on file  Transportation Needs: Not on file  Physical Activity: Not on file  Stress: Not on file  Social Connections: Not on file  Intimate Partner Violence: Not on file    PHYSICAL EXAM  There were no vitals filed for this visit. There is no height or weight on file to calculate BMI.  Generalized: Well developed, in no acute distress  Neurological examination  Mentation: Alert oriented to time, place,  history taking. Follows all commands speech and language fluent Cranial nerve II-XII: Pupils were equal round reactive to light. Extraocular movements were full, visual field were full on confrontational test. Facial sensation and strength were normal. Uvula tongue midline. Head turning and shoulder shrug  were normal and symmetric. Motor: The motor testing reveals 5 over 5 strength of all 4 extremities. Good symmetric motor tone is noted throughout.  Sensory: Sensory testing is intact to soft touch on all 4 extremities. No evidence of extinction is noted.  Coordination: Cerebellar testing reveals good finger-nose-finger and heel-to-shin  bilaterally.  Gait and station: Gait is normal. Tandem gait is normal. Romberg is negative. No drift is seen.  Reflexes: Deep tendon reflexes are symmetric and normal bilaterally.   DIAGNOSTIC DATA (LABS, IMAGING, TESTING) - I reviewed patient records, labs, notes, testing and imaging myself where available.  Lab Results  Component Value Date   WBC 7.5 05/09/2021   HGB 16.3 (H) 05/22/2021   HGB 16.3 (H) 05/22/2021   HCT 48.0 (H) 05/22/2021   HCT 48.0 (H) 05/22/2021   MCV 82 05/09/2021   PLT 211 05/09/2021      Component Value Date/Time   NA 140 05/22/2021 0844   NA 140 05/22/2021 0844   NA 138 05/09/2021 1011   K 4.5 05/22/2021 0844   K 4.5 05/22/2021 0844   CL 101 05/09/2021 1011   CO2 23 05/09/2021 1011   GLUCOSE 92 05/09/2021 1011   GLUCOSE 98 01/17/2016 1206   BUN 16 05/09/2021 1011   CREATININE 1.04 (H) 05/09/2021 1011   CREATININE 0.84 12/27/2015 0833   CALCIUM 10.2 05/09/2021 1011   PROT 7.1 11/14/2021 0912   ALBUMIN 4.4 11/14/2021 0912   AST 21 11/14/2021 0912   ALT 17 11/14/2021 0912   ALKPHOS 50 11/14/2021 0912   BILITOT 0.6 11/14/2021 0912   GFRNONAA 33 (L) 09/29/2015 1553   GFRAA 38 (L) 09/29/2015 1553   Lab Results  Component Value Date   CHOL 130 11/14/2021   HDL 55 11/14/2021   LDLCALC 57 11/14/2021   TRIG 97  11/14/2021   CHOLHDL 2.4 11/14/2021   No results found for: "HGBA1C" No results found for: "VITAMINB12" Lab Results  Component Value Date   TSH 2.070 10/30/2020    Butler Denmark, AGNP-C, DNP 01/08/2022, 3:35 PM [facility] [address], [place], [place] [address] [phone]

## 2022-01-09 ENCOUNTER — Ambulatory Visit: Payer: 59 | Admitting: Neurology

## 2022-01-09 ENCOUNTER — Encounter: Payer: Self-pay | Admitting: Neurology

## 2022-01-09 VITALS — BP 132/68 | HR 61 | Ht 65.0 in | Wt 191.0 lb

## 2022-01-09 DIAGNOSIS — G4733 Obstructive sleep apnea (adult) (pediatric): Secondary | ICD-10-CM

## 2022-01-09 DIAGNOSIS — Z9989 Dependence on other enabling machines and devices: Secondary | ICD-10-CM | POA: Diagnosis not present

## 2022-01-09 NOTE — Patient Instructions (Signed)
I will adjust your pressure settings 6-12 range Check download in 4 weeks Return back in 1 year

## 2022-01-15 ENCOUNTER — Other Ambulatory Visit: Payer: Self-pay | Admitting: Cardiology

## 2022-01-15 DIAGNOSIS — I10 Essential (primary) hypertension: Secondary | ICD-10-CM

## 2022-01-15 DIAGNOSIS — R931 Abnormal findings on diagnostic imaging of heart and coronary circulation: Secondary | ICD-10-CM

## 2022-01-15 DIAGNOSIS — R0609 Other forms of dyspnea: Secondary | ICD-10-CM

## 2022-01-25 ENCOUNTER — Ambulatory Visit (INDEPENDENT_AMBULATORY_CARE_PROVIDER_SITE_OTHER): Payer: 59

## 2022-01-25 ENCOUNTER — Ambulatory Visit: Payer: 59 | Admitting: Podiatry

## 2022-01-25 ENCOUNTER — Encounter: Payer: Self-pay | Admitting: Podiatry

## 2022-01-25 DIAGNOSIS — M7751 Other enthesopathy of right foot: Secondary | ICD-10-CM | POA: Diagnosis not present

## 2022-01-25 DIAGNOSIS — M779 Enthesopathy, unspecified: Secondary | ICD-10-CM | POA: Diagnosis not present

## 2022-01-25 DIAGNOSIS — M109 Gout, unspecified: Secondary | ICD-10-CM

## 2022-01-25 MED ORDER — TRIAMCINOLONE ACETONIDE 10 MG/ML IJ SUSP
10.0000 mg | Freq: Once | INTRAMUSCULAR | Status: AC
  Administered 2022-01-25: 10 mg

## 2022-01-27 NOTE — Progress Notes (Signed)
Subjective:   Patient ID: [name], female   DOB: 66 y.o.   MRN: [id number]   HPI Patient presents stating that she had a lot of redness in the outside of her right foot and now it is in her lesser joints and is sore.  She has not been diagnosed with gout but does run high uric acid levels and has normal sed rate and does not smoke likes to be active   Review of Systems  All other systems reviewed and are negative.       Objective:  Physical Exam Vitals and nursing note reviewed.  Constitutional:      Appearance: She is well-developed.  Pulmonary:     Effort: Pulmonary effort is normal.  Musculoskeletal:        General: Normal range of motion.  Skin:    General: Skin is warm.  Neurological:     Mental Status: She is alert.     Neurovascular status intact muscle strength found to be adequate range of motion adequate with patient found to have inflammation pain of the second MPJ right with a lateral foot negative for signs of arthritis or currently any other kind of soft tissue pathology.  Good digital perfusion well oriented x3     Assessment:  Probability for gout attack but cannot say 100% for sure with moderately high uric acid levels that she seems to run routinely with inflammation currently of the lesser MPJ right     Plan:  H&P x-rays reviewed discussed and I explained gout and we will need to keep an eye on this.  Today I went ahead and I did a forefoot block 60 mg like Marcaine mixture aspirated the second MPJ getting out a small amount of clear fluid but no indications of pathology injected quarter cc dexamethasone Kenalog and advised on relative rigid bottom shoes.  Reappoint to recheck  X-rays were negative for signs of fracture negative for signs of other bony pathology

## 2022-02-06 ENCOUNTER — Encounter: Payer: Self-pay | Admitting: Neurology

## 2022-03-29 ENCOUNTER — Other Ambulatory Visit: Payer: Self-pay | Admitting: Internal Medicine

## 2022-03-29 DIAGNOSIS — Z1231 Encounter for screening mammogram for malignant neoplasm of breast: Secondary | ICD-10-CM

## 2022-04-03 ENCOUNTER — Ambulatory Visit
Admission: RE | Admit: 2022-04-03 | Discharge: 2022-04-03 | Disposition: A | Discharge status: Home / Self Care | Payer: 59 | Source: Ambulatory Visit | Attending: Internal Medicine | Admitting: Internal Medicine

## 2022-04-03 DIAGNOSIS — Z1231 Encounter for screening mammogram for malignant neoplasm of breast: Secondary | ICD-10-CM

## 2022-04-17 ENCOUNTER — Other Ambulatory Visit: Payer: Self-pay | Admitting: Cardiology

## 2022-04-17 DIAGNOSIS — R931 Abnormal findings on diagnostic imaging of heart and coronary circulation: Secondary | ICD-10-CM

## 2022-04-21 ENCOUNTER — Other Ambulatory Visit: Payer: Self-pay | Admitting: Cardiology

## 2022-04-21 DIAGNOSIS — I10 Essential (primary) hypertension: Secondary | ICD-10-CM

## 2022-04-21 DIAGNOSIS — R0609 Other forms of dyspnea: Secondary | ICD-10-CM

## 2022-04-23 ENCOUNTER — Encounter: Payer: Self-pay | Admitting: Cardiology

## 2022-04-23 DIAGNOSIS — I10 Essential (primary) hypertension: Secondary | ICD-10-CM

## 2022-04-23 DIAGNOSIS — R931 Abnormal findings on diagnostic imaging of heart and coronary circulation: Secondary | ICD-10-CM

## 2022-04-23 DIAGNOSIS — E782 Mixed hyperlipidemia: Secondary | ICD-10-CM

## 2022-04-23 DIAGNOSIS — R0609 Other forms of dyspnea: Secondary | ICD-10-CM

## 2022-04-23 MED ORDER — SPIRONOLACTONE 50 MG PO TABS: 50.0000 mg | ORAL_TABLET | Freq: Every day | ORAL | 0 refills | Status: DC

## 2022-04-23 MED ORDER — PRAVASTATIN SODIUM 40 MG PO TABS: ORAL_TABLET | ORAL | 0 refills | Status: DC

## 2022-04-23 MED ORDER — ASPIRIN 81 MG PO TBEC: 81.0000 mg | DELAYED_RELEASE_TABLET | Freq: Every day | ORAL | 3 refills | Status: AC

## 2022-06-25 ENCOUNTER — Encounter: Payer: Self-pay | Admitting: Cardiology

## 2022-06-25 ENCOUNTER — Ambulatory Visit: Payer: 59 | Attending: Cardiology | Admitting: Cardiology

## 2022-06-25 VITALS — BP 120/80 | HR 74 | Ht 65.0 in | Wt 196.0 lb

## 2022-06-25 DIAGNOSIS — I2510 Atherosclerotic heart disease of native coronary artery without angina pectoris: Secondary | ICD-10-CM

## 2022-06-25 DIAGNOSIS — E782 Mixed hyperlipidemia: Secondary | ICD-10-CM | POA: Diagnosis not present

## 2022-06-25 DIAGNOSIS — I10 Essential (primary) hypertension: Secondary | ICD-10-CM

## 2022-06-25 NOTE — Progress Notes (Signed)
Cardiology Office Note:    Date:  06/25/2022   ID:  [name], DOB June 15, 1956, MRN [id number]  PCP:  Prince Solian, MD   [facility] Providers Cardiologist:  Candee Furbish, MD     Referring MD: Prince Solian, MD    History of Present Illness:    [name] is a 66 y.o. female here for follow-up of isolated pulmonary artery aneurysm.  Right heart cath showed no evidence of pulmonary hypertension.  No evidence of thrombus burden on CT.   Symptoms have been fairly longstanding shortness of breath following her ablation in 2019.  No evidence of pulmonary vein stenosis.  Pulmonary visit unremarkable.  Minimal coronary artery disease nonobstructive on CT.   At her last appointment I had lengthy discussion with her and her husband present.  Showed images of CT scan.  Had prior SVT ablation 2017, Dr. Lovena Le.  Had been diagnosed with  obstructive sleep apnea and was currently on CPAP.  She reported that she was still having some shortness of breath with heavy activity.  No chest pain no syncope no fevers chills nausea vomiting.  Today, she reports that she is doing ok overall but she is still having the shortness of breath pretty much all of the time. This has been an issue for her for several years now. We discussed the results of her CT and the pulmonary aneurysm in depth. We discussed options for a plan for further CT in order to monitor the development of the aneurysm.   She was concerned whether or not she should stop going up and down stairs or line dancing since she gets the shortness of breath. Her concern was that pushing through the discomfort may make her condition worse.   She denies any palpitations, chest pain, or peripheral edema. No lightheadedness, headaches, syncope, orthopnea, or PND.     Past Medical History:  Diagnosis Date   Anxiety    Daytime somnolence 04/06/2013   DDD (degenerative disc disease), lumbar    Depression    Hypercholesteremia     Hypertension    OSA on CPAP 04/06/2013   Sleep apnea    Urinary incontinence     Past Surgical History:  Procedure Laterality Date   ABLATION  09/29/2017   BACK SURGERY  04/06/2019   BREAST EXCISIONAL BIOPSY Left    ELECTROPHYSIOLOGIC STUDY N/A 01/01/2016   Procedure: SVT Ablation;  Surgeon: Evans Lance, MD;  Location: [facility];  Service: Cardiovascular;  Laterality: N/A;   HAND SURGERY Right    KNEE ARTHROPLASTY Left    NASAL SEPTUM SURGERY     RADICAL HYSTERECTOMY  2000   RIGHT HEART CATH N/A 05/22/2021   Procedure: RIGHT HEART CATH;  Surgeon: Martinique, Peter M, MD;  Location: [facility];  Service: Cardiovascular;  Laterality: N/A;   TONSILLECTOMY AND ADENOIDECTOMY      Current Medications: Current Meds  Medication Sig   aspirin EC 81 MG tablet Take 1 tablet (81 mg total) by mouth daily. SWALLOW WHOLE.   cetirizine (ZYRTEC) 10 MG tablet Take 10 mg by mouth in the morning.   Cholecalciferol (VITAMIN D3) 50 MCG (2000 UT) TABS Take 2,000 Units by mouth in the morning.   ezetimibe (ZETIA) 10 MG tablet Take 0.5 tablets (5 mg total) by mouth daily.   fluticasone (FLONASE) 50 MCG/ACT nasal spray Place 2 sprays into both nostrils daily as needed for allergies.   hydrocortisone cream 1 % Apply 1 application topically 2 (  two) times daily as needed (skin irritation/itching/rash).   pravastatin (PRAVACHOL) 40 MG tablet TAKE 1 TABLET BY MOUTH EVERY DAY IN THE EVENING   spironolactone (ALDACTONE) 50 MG tablet Take 1 tablet (50 mg total) by mouth daily.   vortioxetine HBr (TRINTELLIX) 10 MG TABS tablet Take 10 mg by mouth in the morning.     Allergies:   Patient has no known allergies.   Social History   Socioeconomic History   Marital status: Married    Spouse name: Not on file   Number of children: 2   Years of education: Not on file   Highest education level: Not on file  Occupational History   Not on file  Tobacco Use   Smoking status: Never   Smokeless  tobacco: Never  Vaping Use   Vaping Use: Never used  Substance and Sexual Activity   Alcohol use: Not Currently    Alcohol/week: 0.0 standard drinks of alcohol   Drug use: No   Sexual activity: Yes    Partners: Male    Birth control/protection: Surgical    Comment: hysterectomy  Other Topics Concern   Not on file  Social History Narrative   Not on file   Social Determinants of Health   Financial Resource Strain: Not on file  Food Insecurity: Not on file  Transportation Needs: Not on file  Physical Activity: Not on file  Stress: Not on file  Social Connections: Not on file     Family History: The patient's family history includes Diabetes in her maternal grandmother; Heart disease in her father; Hyperlipidemia in her father and mother; Hypertension in her father; Stroke in her father. There is no history of Sleep apnea.  ROS:   Please see the history of present illness.    (+) shortness of breath  All other systems reviewed and are negative.  EKGs/Labs/Other Studies Reviewed:    The following studies were reviewed today: Coronary CT scan 05/04/2021: Aorta: Normal size. No calcifications. No dissection. Aortic atherosclerosis noted.   Main Pulmonary Artery: Severely dilated main pulmonary artery 54 mm.   Aortic Valve:  Tri-leaflet.  No calcifications.   Coronary Arteries:  Normal coronary origin.  Left dominance.   Coronary Calcium Score:   Left main: 0   Left anterior descending artery: 46   Left circumflex artery: 0   Right coronary artery: 0   Total: 46   Percentile: 77th for age, sex, and race matched control.   RCA is a small non-dominant artery that gives rise to PDA and PLA. There is no significant plaque.   Left main is a large artery that gives rise to LAD, ramus intermedius, and LCX arteries. There is no significant plaque.   LAD is a large vessel that gives rise to one large D1 Branch. There is minimal non-obstructive calcified plaque (<  25%) in the proximal and mid LAD.   LCX is a non-dominant artery that gives rise to one large OM1 branch. 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 There is no significant plaque. Notable misregistration artifact (as below) prominent in the mid vessel.   Ramus intermedius has no significant plaque (small vessel).   Other findings:   Normal variant pulmonary vein drainage into the left atrium (LUPV and LLPV fuse to a common ostium).   - RSPV 21 mm X 18 mm   - RLPV: 14 mm X 14 mm (variable sizing through systolic and  diastolic phases argues against stenosis)   - LCPV: 26 mm X 20 mm   Normal left atrial appendage without a thrombus.   Misregistration artifact related to patient motion noted.   Severely dilated IVC 20 mm   At least mild right ventricular dilation; study not optimized for right ventricular imaging.   PFO and small atrial septal aneurysm noted.   Extra-cardiac findings: See attached radiology report for non-cardiac structures.   IMPRESSION: 1. Coronary calcium score of 46. This was 77th percentile for age, sex, and race matched control.   2. Normal coronary origin with left dominance.   3. Aortic atherosclerosis noted.   4. CAD-RADS 1. Minimal non-obstructive CAD (1-24%). Consider non-atherosclerotic causes of chest pain. Consider preventive therapy and risk factor modification.   5. Severely dilated main pulmonary artery 54 mm.   6.  Study not-consistent with ostial pulmonary vein stenosis.   7. PFO and small atrial septal aneurysm noted.   8. 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     Exercise Sestamibi stress test 11/06/2020: Exercise nuclear stress test was performed using Bruce protocol. Patient reached 7.8 METS, and 83% of age predicted maximum heart rate. Exercise capacity was fair. No chest  pain reported. Heart rate and hemodynamic response were normal. Stress EKG revealed no ischemic changes. SPECT images show very small sized, mild intensity, partially reversible perfusion defect in basal inferolateral myocardium. Stress LVEF 65%. Low risk study.   Echo 10/30/2020: Left ventricle cavity is normal in size. Mild concentric hypertrophy of  the left ventricle. Normal global wall motion. Normal LV systolic function  with EF 60%. Doppler evidence of grade II (pseudonormal) diastolic  dysfunction, elevated LAP.  Mild (Grade I) aortic regurgitation.  Mild tricuspid regurgitation.  No evidence of pulmonary hypertension.  No significant change compared to previous study on 08/02/2019.   EKG: EKG is personally reviewed.  06/25/2022: Normal Sinus rhythm, PACs, heart rate 74 bpm.  Recent Labs: 11/14/2021: ALT 17  Recent Lipid Panel    Component Value Date/Time   CHOL 130 11/14/2021 0912   TRIG 97 11/14/2021 0912   HDL 55 11/14/2021 0912   CHOLHDL 2.4 11/14/2021 0912   LDLCALC 57 11/14/2021 0912     Risk Assessment/Calculations:              Physical Exam:    VS:  BP 120/80 (BP Location: Left Arm, Patient Position: Sitting, Cuff Size: Normal)   Pulse 74   Ht '5\' 5"'$  (1.651 m)   Wt 196 lb (88.9 kg)   BMI 32.62 kg/m     Wt Readings from Last 3 Encounters:  06/25/22 196 lb (88.9 kg)  01/09/22 191 lb (86.6 kg)  08/24/21 194 lb (88 kg)     GEN:  Well nourished, well developed in no acute distress HEENT: Normal NECK: No JVD; No carotid bruits LYMPHATICS: No lymphadenopathy CARDIAC: RRR, no murmurs, no rubs, gallops RESPIRATORY:  Clear to auscultation without rales, wheezing or rhonchi  ABDOMEN: Soft, non-tender, non-distended MUSCULOSKELETAL:  No edema; No deformity  SKIN: Warm and dry NEUROLOGIC:  Alert and oriented x 3 PSYCHIATRIC:  Normal affect   ASSESSMENT:    1. Primary hypertension   2. Coronary artery disease involving native coronary artery of native  heart without angina pectoris   3. Mixed hyperlipidemia     PLAN:    In order of problems listed above:  Dilated pulmonary artery Stable on recent  CT scan that was noncontrasted looking at lung fields.  She will be having another one in May 2024.  We will see her after this visit.  Discussed again.  Right heart catheterization showed no evidence of pulmonary hypertension.  Reassuring.  Shortness of breath Longstanding.  Pulmonary has evaluated.  We have evaluated.  She does have grade 2 diastolic dysfunction on her most recent echocardiogram.  Could be playing a contributing factor but her shortness of breath seems out of proportion to this.  She does not have pulmonary hypertension.  Continuing with spironolactone 50.  Coronary artery calcification/nonobstructive coronary disease Now on Zetia.  Also on pravastatin 40.  Doing very well.  Excellent cholesterol readings.  Continue.     Follow Up: End of May 2024  Medication Adjustments/Labs and Tests Ordered: Current medicines are reviewed at length with the patient today.  Concerns regarding medicines are outlined above.  Orders Placed This Encounter  Procedures   EKG 12-Lead   No orders of the defined types were placed in this encounter.   Patient Instructions  Medication Instructions:  Your physician recommends that you continue on your current medications as directed. Please refer to the Current Medication list given to you today.  *If you need a refill on your cardiac medications before your next appointment, please call your pharmacy*   Lab Work: None. If you have labs (blood work) drawn today and your tests are completely normal, you will receive your results only by: [place] (if you have MyChart) OR A paper copy in the mail If you have any lab test that is abnormal or we need to change your treatment, we will call you to review the results.   Testing/Procedures: None.   Follow-Up: At [facility], you and your health needs are our priority.  As part of our continuing mission to provide you with exceptional heart care, we have created designated Provider Care Teams.  These Care Teams include your primary Cardiologist (physician) and Advanced Practice Providers (APPs -  Physician Assistants and Nurse Practitioners) who all work together to provide you with the care you need, when you need it.  We recommend signing up for the patient portal called "MyChart".  Sign up information is provided on this After Visit Summary.  MyChart is used to connect with patients for Virtual Visits (Telemedicine).  Patients are able to view lab/test results, encounter notes, upcoming appointments, etc.  Non-urgent messages can be sent to your provider as well.   To learn more about what you can do with MyChart, go to [email or web address].    Important Information About Sugar          I,Jessica Ford,acting as a scribe for [employer], MD.,have documented all relevant documentation on the behalf of Candee Furbish, MD,as directed by  Candee Furbish, MD while in the presence of Candee Furbish, MD.   I, Candee Furbish, MD, have reviewed all documentation for this visit. The documentation on 06/25/22 for the exam, diagnosis, procedures, and orders are all accurate and complete.   Signed, Candee Furbish, MD  06/25/2022 5:35 PM    [facility]

## 2022-06-25 NOTE — Patient Instructions (Signed)
Medication Instructions:  Your physician recommends that you continue on your current medications as directed. Please refer to the Current Medication list given to you today.  *If you need a refill on your cardiac medications before your next appointment, please call your pharmacy*   Lab Work: None. If you have labs (blood work) drawn today and your tests are completely normal, you will receive your results only by: [place] (if you have MyChart) OR A paper copy in the mail If you have any lab test that is abnormal or we need to change your treatment, we will call you to review the results.   Testing/Procedures: None.   Follow-Up: At [facility], you and your health needs are our priority.  As part of our continuing mission to provide you with exceptional heart care, we have created designated Provider Care Teams.  These Care Teams include your primary Cardiologist (physician) and Advanced Practice Providers (APPs -  Physician Assistants and Nurse Practitioners) who all work together to provide you with the care you need, when you need it.  We recommend signing up for the patient portal called "MyChart".  Sign up information is provided on this After Visit Summary.  MyChart is used to connect with patients for Virtual Visits (Telemedicine).  Patients are able to view lab/test results, encounter notes, upcoming appointments, etc.  Non-urgent messages can be sent to your provider as well.   To learn more about what you can do with MyChart, go to [email or web address].    Important Information About Sugar

## 2022-06-27 ENCOUNTER — Telehealth: Payer: Self-pay | Admitting: Pulmonary Disease

## 2022-06-28 NOTE — Telephone Encounter (Signed)
Mychart encounter.

## 2022-07-21 ENCOUNTER — Other Ambulatory Visit: Payer: Self-pay | Admitting: Cardiology

## 2022-07-21 DIAGNOSIS — R931 Abnormal findings on diagnostic imaging of heart and coronary circulation: Secondary | ICD-10-CM

## 2022-07-25 ENCOUNTER — Other Ambulatory Visit: Payer: Self-pay | Admitting: Cardiology

## 2022-07-25 DIAGNOSIS — E782 Mixed hyperlipidemia: Secondary | ICD-10-CM

## 2022-07-25 DIAGNOSIS — R931 Abnormal findings on diagnostic imaging of heart and coronary circulation: Secondary | ICD-10-CM

## 2022-07-25 DIAGNOSIS — R0609 Other forms of dyspnea: Secondary | ICD-10-CM

## 2022-07-25 DIAGNOSIS — I10 Essential (primary) hypertension: Secondary | ICD-10-CM

## 2022-09-09 ENCOUNTER — Encounter: Payer: Self-pay | Admitting: Neurology

## 2022-09-12 ENCOUNTER — Ambulatory Visit (INDEPENDENT_AMBULATORY_CARE_PROVIDER_SITE_OTHER): Payer: Medicare Other | Admitting: Neurology

## 2022-09-12 ENCOUNTER — Encounter: Payer: Self-pay | Admitting: Neurology

## 2022-09-12 VITALS — BP 150/82 | HR 53 | Ht 65.0 in | Wt 200.0 lb

## 2022-09-12 DIAGNOSIS — G4733 Obstructive sleep apnea (adult) (pediatric): Secondary | ICD-10-CM | POA: Diagnosis not present

## 2022-09-12 NOTE — Progress Notes (Signed)
Subjective:    Patient ID: [name] is a 67 y.o. female.  HPI    Interim history:   Ms. [name] is a 67 year old right-handed woman with an underlying medical history of SVT, status post ablation, degenerative lumbar disc disease, depression, anxiety, hyperlipidemia, hypertension, and mild obesity, who  presents for follow-up consultation of her obstructive sleep apnea on AutoPap therapy.  The patient is unaccompanied today.  She was last seen in this clinic by Butler Denmark on 01/09/2022, at which time she was compliant with her AutoPap, she subjectively felt that she needed more air and her pressure range was increased to 6 to 12 cm.  Her home sleep test from 10/03/2021 had shown an AHI of 9.4/h, O2 nadir 89% with mild to moderate snoring noted for most of the night.  Her set up date with her new AutoPap machine was 11/12/2021, her DME company is [employer].  Today, 09/12/2022: I reviewed her AutoPap compliance data from 08/12/2022 through 09/10/2022 which is a total of 30 days, during which time she used her machine only 9 days with percent use days greater than 4 hours at 30%, indicating suboptimal compliance, average usage for days on treatment of 8 hours and 2 minutes, residual AHI at goal at 0.7/h, 95th percentile of pressure at 10.6 cm with a range of 6 to 12 cm with EPR of 2.  Leak acceptable with the 95th percentile at 14.1 L/min.  In the month of December through mid January she was a lot better with her compliance and more consistent with her usage, average usage of 7 hours and 32 minutes, percent use days greater than 4 hours at 73%, average AHI 0.6/h, average pressure for the 95th percentile 10 cm, leak on the lower side with the 95th percentile at 7.9 L/min.  She reports that because of family emergencies she had to travel and she could not bring her machine and had therefore some lapses in treatment.  She had to switch insurances since she switched over to [facility] in December and needed a  attestation to her compliance and a face-to-face visit.  She had almost completed her contract with the DME company for her AutoPap machine through her previous insurance.  She reports generally being compliant with her AutoPap, she tolerates the pressure currently.  The patient's allergies, current medications, family history, past medical history, past social history, past surgical history and problem list were reviewed and updated as appropriate.   Previously:   08/23/21: (She) has been previously diagnosed with sleep apnea.  She has been on CPAP therapy.  She reports that she was not consistent with her CPAP usage but in the past 3 months she got back to using her machine regularly.  I was able to review her compliance download for the past 90 days from 05/26/2021 through 08/23/2021, she is over 90% compliant for days on treatment with no more than 4 hours.  Average usage of 7 hours and 55 minutes, residual AHI 1.5/h, leak acceptable with a 95th percentile at 7.3 L/min, pressure of 6 cm with EPR.  She uses a small N 20 nasal mask.  She needs new supplies.  She may be eligible for a new machine.  She does not always wake up rested and has daytime tiredness.  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  She is a non-smoker does not utilize any alcohol, does not drink caffeine daily.  Her bedtime is generally around midnight  and rise time between 9 and 9:30 AM.  She lives with her husband.  She denies night to night nocturia or recurrent morning headaches.  Her Epworth sleepiness score is 10 out of 24, fatigue severity score is 52 out of 63.  She is followed by her psychiatry nurse practitioner, he is on Trintellix, she is currently not on Strattera although she has a prescription filled for it.  It caused her constipation so she is not actually using it now. I reviewed your office note from 05/18/2021. She has not been seen in this clinic since 2017.   05/28/16: 67 year old  right-handed woman with an underlying medical history of hypertension, OCD and mood disorder who presents for follow-up consultation of her obstructive sleep apnea, after her recent sleep study. The patient is unaccompanied today. I last saw her on 12/13/2015, at which time she was referred by her cardiologist for reevaluation. I had seen her prior to that in September 2014 after sleep studies. At her visit on 12/13/2015 she reported that she had stopped using CPAP. She used it perhaps for a year. She also has suffered from low back pain and was not able to exercise. She had some changes to her medications through her psychiatrist. She was no longer on Depakote or Lamictal. She was on Vyvanse and Trintellix. She had gained weight since her original sleep study, about 15 pounds. She was more tired during the day. I suggested she return for full night CPAP re-titration study. She had this on 02/02/2016. Sleep efficiency was 84.6%, sleep latency was 13.5 minutes and wake after sleep onset was 57.5 minutes with moderate to severe sleep fragmentation noted. She had an increased percentage of stage I sleep, an increased percentage of slow-wave sleep and a decreased percentage of REM sleep with a markedly prolonged REM latency of 257.5 minutes. She had no significant PLMS, she had no significant EKG changes. Average heart rate however was 44. Average oxygen saturation was 93%, nadir was 88%. CPAP was started on 5 cm and increase to 6 cm in which AHI was 0 per hour. Nonsupine REM sleep was achieved on the final pressure. Based on her test results are prescribed CPAP therapy for home use.   05/28/2016: I reviewed her CPAP compliance data from 04/27/2016 through 05/26/2016 which is a total of 30 days, during which time she used her CPAP 28 days with percent used days greater than 4 hours and 83%, indicating very good compliance with an average usage of 6 hours and 34 minutes, residual AHI 2.8 per hour, leak low for the 95th  percentile at 1.8 L/m on a pressure of 6 cm with EPR.    She reports doing okay, has adjusted fairly well, but has a tendency to clench her teeth at night.  In March 2017 she developed SVT and saw cardiology after going to the emergency room. She was in the process of being evaluated for an ablation procedure. She had an interim SVT ablation procedure on 01/01/2016 and I reviewed the records. In the interim, she presented to the emergency room on 01/17/2016 with recurrence of tachycardia, was diagnosed with recurrence of SVT. Symptoms improved with adenosine. She may be seeking a second opinion at [facility], she is looking into it. Stable mood. May need to switch to another psych provider.      04/06/2013: She presents for follow-up consultation after a recent set of sleep studies. sI had originally met her at the request of her psychiatrist on 12/10/2012, at which  time she reported a prior sleep study some 15 years ago but no diagnosis of obstructive sleep apnea. She has recently been experiencing worsening daytime somnolence and snoring. Her baseline sleep study and her CPAP titration study were discussed. Her baseline sleep study was from 12/24/2012 and demonstrated a total AHI of 11 per hour, supine AHI of 23.3 per hour and a REM related AHI of 30 per hour with an oxyhemoglobin desaturation nadir of 81%. Based on those test results I requested that she come back for a CPAP titration study which she had on 01/14/2013. Sleep efficiency was reduced at 75.7% with a normal sleep latency. Wake after sleep onset was increased at 89 minutes with mild sleep fragmentation noted. Her arousal index was low. Her sleep architecture showed a normal percentage of stage I and stage II sleep, and he increased percentage of slow-wave sleep at 34.4% and a mildly decreased percentage of REM sleep at 14% with a normal REM latency. She had no significant periodic leg movements. She had CPAP from 5 cm to 6 cm of pressure and had  almost complete resolution of her sleep disordered breathing on 6 cm of water pressure with a medium nasal pillows mask. I reviewed compliance data from 03/01/2013 through 03/16/2013 which is a total of 15 days during which time she used it every day except for one day. Her percent use stays greater than 4 hours was 88% with an average usage for all days of 6 hours and 41 minutes. Her residual AHI was 6.5.  I also reviewed her compliance data from 03/01/13 to 03/31/13 which is a total of 29 days, during which time she used it every day except for 2 days. The average usage for all days was 5 hours and 56 minutes. The percent used days greater than 4 hours was 94%, indicating excellent compliance. The residual AHI was 4.3 per hour, indicating an appropriate treatment pressure of 6 cwp. .   She goes to bed by MN and it takes her a long time to fall asleep. She has been on Lamictal with a recent increase, and Depakote and has started a new antidepressant. She has been on Dextrostat for the past 2 years prn. She takes 10 mg in the morning. She wakes up around 10 AM. She falls asleep around 4 PM and may take a 2 hour nap. She has not necessarily noted an improvement with CPAP, and indicates full compliance. She has occasional sinus congestion. She takes Flonase. She has not been using a nasal rinse system.   Her Past Medical History Is Significant For: Past Medical History:  Diagnosis Date   Anxiety    Daytime somnolence 04/06/2013   DDD (degenerative disc disease), lumbar    Depression    Hypercholesteremia    Hypertension    OSA on CPAP 04/06/2013   Sleep apnea    Urinary incontinence     Her Past Surgical History Is Significant For: Past Surgical History:  Procedure Laterality Date   ABLATION  09/29/2017   BACK SURGERY  04/06/2019   BREAST EXCISIONAL BIOPSY Left    CATARACT EXTRACTION Bilateral    ELECTROPHYSIOLOGIC STUDY N/A 01/01/2016   Procedure: SVT Ablation;  Surgeon: Evans Lance, MD;   Location: [place] [facility];  Service: Cardiovascular;  Laterality: N/A;   HAND SURGERY Right    KNEE ARTHROPLASTY Left    NASAL SEPTUM SURGERY     RADICAL HYSTERECTOMY  2000   RIGHT HEART CATH N/A 05/22/2021  Procedure: RIGHT HEART CATH;  Surgeon: Martinique, Peter M, MD;  Location: [place] [facility];  Service: Cardiovascular;  Laterality: N/A;   TONSILLECTOMY AND ADENOIDECTOMY      Her Family History Is Significant For: Family History  Problem Relation Age of Onset   Hyperlipidemia Mother    Heart disease Father    Hypertension Father    Stroke Father    Hyperlipidemia Father    Diabetes Maternal Grandmother    Sleep apnea Neg Hx     Her Social History Is Significant For: Social History   Socioeconomic History   Marital status: Married    Spouse name: Not on file   Number of children: 2   Years of education: Not on file   Highest education level: Not on file  Occupational History   Not on file  Tobacco Use   Smoking status: Never   Smokeless tobacco: Never  Vaping Use   Vaping Use: Never used  Substance and Sexual Activity   Alcohol use: Not Currently    Alcohol/week: 0.0 standard drinks of alcohol   Drug use: No   Sexual activity: Yes    Partners: Male    Birth control/protection: Surgical    Comment: hysterectomy  Other Topics Concern   Not on file  Social History Narrative   Not on file   Social Determinants of Health   Financial Resource Strain: Not on file  Food Insecurity: Not on file  Transportation Needs: Not on file  Physical Activity: Not on file  Stress: Not on file  Social Connections: Not on file    Her Allergies Are:  No Known Allergies:   Her Current Medications Are:  Outpatient Encounter Medications as of 09/12/2022  Medication Sig   aspirin EC 81 MG tablet Take 1 tablet (81 mg total) by mouth daily. SWALLOW WHOLE.   cetirizine (ZYRTEC) 10 MG tablet Take 10 mg by mouth in the morning.   Cholecalciferol (VITAMIN D3) 50 MCG (2000 UT)  TABS Take 2,000 Units by mouth in the morning.   Colchicine (COLCRYS PO) Take 0.6 mg by mouth. Was twice daily, now once daily, then taper   ezetimibe (ZETIA) 10 MG tablet Take 0.5 tablets (5 mg total) by mouth daily.   fluticasone (FLONASE) 50 MCG/ACT nasal spray Place 2 sprays into both nostrils daily as needed for allergies.   hydrocortisone cream 1 % Apply 1 application topically 2 (two) times daily as needed (skin irritation/itching/rash).   pravastatin (PRAVACHOL) 40 MG tablet TAKE 1 TABLET BY MOUTH EVERY DAY IN THE EVENING   predniSONE (DELTASONE) 20 MG tablet Take by mouth. Taper, 40 mg, 40 mg, 40 mg, then 20 mg x 3 days   spironolactone (ALDACTONE) 50 MG tablet TAKE 1 TABLET BY MOUTH EVERY DAY   vortioxetine HBr (TRINTELLIX) 10 MG TABS tablet Take 10 mg by mouth in the morning.   No facility-administered encounter medications on file as of 09/12/2022.  :  Review of Systems:  Out of a complete 14 point review of systems, all are reviewed and negative with the exception of these symptoms as listed below:  Review of Systems  Neurological:        Patient is here for CPAP follow-up required by medicare. Patient hasn't been using her cpap much because she has been out of town and has not taken her machine with her. ESS 12.    Objective:  Neurological Exam  Physical Exam Physical Examination:   Vitals:   09/12/22 1401  BP: Marland Kitchen)  153/71  Pulse: (!) 48    General Examination: The patient is a very pleasant 67 y.o. female in no acute distress. She appears well-developed and well-nourished and well groomed.   HEENT: Normocephalic, atraumatic, pupils are equal, round and reactive to light, tracking well-preserved, corrective eyeglasses in place.  Speech is clear without dysarthria, hypophonia or voice tremor.  Hearing grossly intact.  Neck with full range of motion, no carotid bruits.     Chest: Clear to auscultation without wheezing, rhonchi or crackles noted.   Heart: S1+S2+0,  regular and normal without murmurs, rubs or gallops noted.    Abdomen: Soft, non-tender and non-distended.   Extremities: There is no obvious edema in the distal lower extremities bilaterally.    Skin: Warm and dry without trophic changes noted.    Musculoskeletal: exam reveals no obvious joint deformities.    Neurologically:  Mental status: The patient is awake, alert and oriented in all 4 spheres. Her immediate and remote memory, attention, language skills and fund of knowledge are appropriate. There is no evidence of aphasia, agnosia, apraxia or anomia. Speech is clear with normal prosody and enunciation. Thought process is linear. Mood is normal and affect is normal.  Cranial nerves II - XII are as described above under HEENT exam.  Motor exam: Normal bulk, strength and tone is noted. There is no tremor. Fine motor skills and coordination: grossly intact.  Cerebellar testing: No dysmetria or intention tremor. There is no truncal or gait ataxia.  Sensory exam: intact to light touch.    Assessment and Plan:  In summary, RECA VANLOAN is a very pleasant 67 year old female with an underlying medical history of SVT, status post ablation, degenerative lumbar disc disease, depression, anxiety, hyperlipidemia, hypertension, and mild obesity, who presents for follow-up consultation of her obstructive sleep apnea.  She is on AutoPap therapy with good compliance and good apnea control.  She had some lapses in treatment in January because of family emergencies and travel.  She is motivated to continue with treatment, AutoPap settings of 6 to 12 cm are tolerated with apnea score at goal, leak is acceptable from the mask.  She switched insurances to [facility] in January 2023 when she turns 76.  She may be able to buy out her current machine as she had not completed her rental contract as I understand.  We will inquire with her current DME provider.  At this juncture, she is advised from the clinical  standpoint to follow-up in sleep clinic in 1 year to see one of our nurse practitioners and encouraged to be consistent with her AutoPap machine.  I answered all her questions today and she was in agreement.   I spent 30 minutes in total face-to-face time and in reviewing records during pre-charting, more than 50% of which was spent in counseling and coordination of care, reviewing test results, reviewing medications and treatment regimen and/or in discussing or reviewing the diagnosis of OSA, the prognosis and treatment options. Pertinent laboratory and imaging test results that were available during this visit with the patient were reviewed by me and considered in my medical decision making (see chart for details).

## 2022-09-12 NOTE — Patient Instructions (Signed)
Please continue to use your AutoPap consistently.  Your apnea is under good control on the current settings.  From my end of things he can follow-up in sleep clinic routinely in 1 year. The address for [facility] is: [address] [phone].  Please call [employer] to see if you can buy out your current machine.  If you start a new rental contract on Medicare, you may be eligible for a new machine but I am not fully sure.

## 2022-09-16 NOTE — Progress Notes (Signed)
[name]; Brandon Melnick, RN; Sheran Lawless, [name],  I sent your message to our billing team for review.  We do not have her 2nd ins listed.  Thank you,  Brad New     Previous Messages    ----- Message ----- From: Miquel Dunn Sent: 09/12/2022   3:24 PM EST To: Darlina Guys; Miquel Dunn; Nash Shearer; * Subject: RE: cpap issue                                Received, Thank you!  ----- Message ----- From: Brandon Melnick, RN Sent: 09/12/2022   2:49 PM EST To: Darlina Guys; Miquel Dunn; Nash Shearer; * Subject: cpap issue                                    Good Afternoon,  Can you help with this patient?  I understand that she has cpap for 10 months under commercial insurance and was renting machine, but 12 / 2023 and Jan 2024 she went to medicare.  There is issue with her former and now new contract with medicare.  Can you look this up and see what options are available to her ?  This may need to go to accounting?  Let me know please.  Acupuncturist

## 2022-09-18 NOTE — Progress Notes (Addendum)
Sent another message to [employer], about  this pt and her contract for machine (change from commercial to medicare) [place], Lyndel Safe, Elliot Dally, RN; [place], [place]; [place], Milana Huntsman, I see that [employer] sent an inquiry to our billing department on the 16th but I don't have an update yet.  I sent a second request today. Thank you! Melissa     Previous Messages    ----- Message ----- From: Brandon Melnick, RN Sent: 09/18/2022   1:35 PM EST To: Darlina Guys Subject: question about the pt and her contract [place],  Have you been in touch with patient?  Some confusion about her getting a new contract due to getting medicare when almost finished with paying for machine when had commercial insurance?  Let me know or contact me if need more clarification.  Thanks  [employer]. [place] Female, 67 y.o., 03/17/1956 MRN: [id number]

## 2022-09-19 ENCOUNTER — Ambulatory Visit (INDEPENDENT_AMBULATORY_CARE_PROVIDER_SITE_OTHER): Payer: Medicare Other | Admitting: Pulmonary Disease

## 2022-09-19 ENCOUNTER — Encounter: Payer: Self-pay | Admitting: Pulmonary Disease

## 2022-09-19 VITALS — BP 110/80 | HR 66 | Ht 65.0 in | Wt 197.8 lb

## 2022-09-19 DIAGNOSIS — I281 Aneurysm of pulmonary artery: Secondary | ICD-10-CM | POA: Diagnosis not present

## 2022-09-19 DIAGNOSIS — R911 Solitary pulmonary nodule: Secondary | ICD-10-CM | POA: Diagnosis not present

## 2022-09-19 DIAGNOSIS — Z8616 Personal history of COVID-19: Secondary | ICD-10-CM | POA: Diagnosis not present

## 2022-09-19 NOTE — Patient Instructions (Addendum)
Thank you for visiting Dr. Valeta Harms at [facility]. Today we recommend the following:  Orders Placed This Encounter  Procedures   CT Angio Chest W/Cm &/Or Wo Cm   Return in about 3 months (around 12/18/2022) for with APP.    Please do your part to reduce the spread of COVID-19.

## 2022-09-19 NOTE — Progress Notes (Signed)
Synopsis: Referred in February 2021 for dyspnea on exertion by Prince Solian, MD  Subjective:   PATIENT ID: [name] GENDER: female DOB: 23-Feb-1956, MRN: [id number]  Chief Complaint  Patient presents with   Follow-up    F/up    This is a 67 year old female, past medical history of OSA on CPAP, hypertension, hyper bulimia.  Referred for evaluation of dyspnea.  Patient saw Binnie Kand, NP cardiology office 08/25/2019.  Office visit reviewed.  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  Had ablation attempted by Dr. Lovena Le 2017 which was unsuccessful later seen by Dr. Glennon Mac at [facility] in 2019.  Had repeat ablation.  Echo with normal LVEF and grade 2 diastolic dysfunction.  She had a recent stress test which revealed no perfusion abnormalities etiology of her dyspnea have remained unclear.  Not felt to be related to heart failure.  Decision was made to refer to pulmonary medicine for evaluation.  OV 09/03/2019: she states that when she had covid19 she lost her taste and smell. Her SOB however has been going on for some time. She limited her exercise tolerance due to SVT history but restarted her exercise routine after her ablation. She currently works for [employer]. She has back problems and had back surgery. She has had some limitation due to this. Lifelong non-smoker.  Patient denies hemoptysis chest tightness.  Unsure about wheezing.  Does have significant dyspnea with climbing stairs or minimal exertion for activities of daily living.  OV 12/08/2019: Here today for follow-up.  Overall doing well.  Respiratory symptoms have resolved.  Her cough is better after starting PPI.  She is trying to become more active.  Lifelong non-smoker.  Denies hemoptysis, weight loss fevers chills night sweats nausea vomiting.  OV 12/27/2020: Here today for follow-up after recent CT chest.  Patient had CT scan of the chest on 12/16/2020 which revealed multiple pulmonary nodules that are stable in size.  She  does have a moderate hiatal hernia.  Pulmonary artery was also slightly enlarged at 5.1 cm.  She did however have an echocardiogram completed in January 2021.  As well as April 2022 which showed no evidence of PAH.  However grade 2 diastolic dysfunction.  Patient overall has no respiratory complaints.  She is anxious undergoing a lot of stress right now with father in the hospital.  OV 09/19/2022: Here today for follow-up.  Last seen in the office back in June 2022.  She had a follow-up CT scan for nodules that she did not see [name] for afterwards but the scan was completed in June 2023.  Nodules were stable recommended 57-monthfollow-up.  Recommendations for that was placed in the chart.  Does not appear a repeat CT has been scheduled yet.  She still feeling short of breath with exertion.  Reviewed her pulmonary function test today in the office.  We talked about weight loss and endurance management.  Cardiology is not concerned that her heart is causing these problems.    Past Medical History:  Diagnosis Date   Anxiety    Daytime somnolence 04/06/2013   DDD (degenerative disc disease), lumbar    Depression    Hypercholesteremia    Hypertension    OSA on CPAP 04/06/2013   Sleep apnea    Urinary incontinence      Family History  Problem Relation Age of Onset   Hyperlipidemia Mother    Heart disease Father    Hypertension Father    Stroke Father    Hyperlipidemia  Father    Diabetes Maternal Grandmother    Sleep apnea Neg Hx      Past Surgical History:  Procedure Laterality Date   ABLATION  09/29/2017   BACK SURGERY  04/06/2019   BREAST EXCISIONAL BIOPSY Left    CATARACT EXTRACTION Bilateral    ELECTROPHYSIOLOGIC STUDY N/A 01/01/2016   Procedure: SVT Ablation;  Surgeon: Evans Lance, MD;  Location: [facility];  Service: Cardiovascular;  Laterality: N/A;   HAND SURGERY Right    KNEE ARTHROPLASTY Left    NASAL SEPTUM SURGERY     RADICAL HYSTERECTOMY  2000   RIGHT HEART CATH  N/A 05/22/2021   Procedure: RIGHT HEART CATH;  Surgeon: Martinique, Peter M, MD;  Location: [facility];  Service: Cardiovascular;  Laterality: N/A;   TONSILLECTOMY AND ADENOIDECTOMY      Social History   Socioeconomic History   Marital status: Married    Spouse name: Not on file   Number of children: 2   Years of education: Not on file   Highest education level: Not on file  Occupational History   Not on file  Tobacco Use   Smoking status: Never   Smokeless tobacco: Never  Vaping Use   Vaping Use: Never used  Substance and Sexual Activity   Alcohol use: Not Currently    Alcohol/week: 0.0 standard drinks of alcohol   Drug use: No   Sexual activity: Yes    Partners: Male    Birth control/protection: Surgical    Comment: hysterectomy  Other Topics Concern   Not on file  Social History Narrative   Not on file   Social Determinants of Health   Financial Resource Strain: Not on file  Food Insecurity: Not on file  Transportation Needs: Not on file  Physical Activity: Not on file  Stress: Not on file  Social Connections: Not on file  Intimate Partner Violence: Not on file     No Known Allergies   Outpatient Medications Prior to Visit  Medication Sig Dispense Refill   allopurinol (ZYLOPRIM) 100 MG tablet Take 100 mg by mouth daily.     aspirin EC 81 MG tablet Take 1 tablet (81 mg total) by mouth daily. SWALLOW WHOLE. 90 tablet 3   cetirizine (ZYRTEC) 10 MG tablet Take 10 mg by mouth in the morning.     Cholecalciferol (VITAMIN D3) 50 MCG (2000 UT) TABS Take 2,000 Units by mouth in the morning.     Colchicine (COLCRYS PO) Take 0.6 mg by mouth. Was twice daily, now once daily, then taper     ezetimibe (ZETIA) 10 MG tablet Take 0.5 tablets (5 mg total) by mouth daily. 45 tablet 3   fluticasone (FLONASE) 50 MCG/ACT nasal spray Place 2 sprays into both nostrils daily as needed for allergies.     hydrocortisone cream 1 % Apply 1 application topically 2 (two) times daily as  needed (skin irritation/itching/rash).     pravastatin (PRAVACHOL) 40 MG tablet TAKE 1 TABLET BY MOUTH EVERY DAY IN THE EVENING 90 tablet 3   predniSONE (DELTASONE) 20 MG tablet Take by mouth. Taper, 40 mg, 40 mg, 40 mg, then 20 mg x 3 days     spironolactone (ALDACTONE) 50 MG tablet TAKE 1 TABLET BY MOUTH EVERY DAY 90 tablet 3   vortioxetine HBr (TRINTELLIX) 10 MG TABS tablet Take 10 mg by mouth in the morning.     No facility-administered medications prior to visit.    Review of Systems  Constitutional:  Negative for chills, fever, malaise/fatigue and weight loss.  HENT:  Negative for hearing loss, sore throat and tinnitus.   Eyes:  Negative for blurred vision and double vision.  Respiratory:  Positive for shortness of breath. Negative for cough, hemoptysis, sputum production, wheezing and stridor.   Cardiovascular:  Negative for chest pain, palpitations, orthopnea, leg swelling and PND.  Gastrointestinal:  Negative for abdominal pain, constipation, diarrhea, heartburn, nausea and vomiting.  Genitourinary:  Negative for dysuria, hematuria and urgency.  Musculoskeletal:  Negative for joint pain and myalgias.  Skin:  Negative for itching and rash.  Neurological:  Negative for dizziness, tingling, weakness and headaches.  Endo/Heme/Allergies:  Negative for environmental allergies. Does not bruise/bleed easily.  Psychiatric/Behavioral:  Negative for depression. The patient is not nervous/anxious and does not have insomnia.   All other systems reviewed and are negative.    Objective:  Physical Exam Vitals reviewed.  Constitutional:      General: She is not in acute distress.    Appearance: She is well-developed.  HENT:     Head: Normocephalic and atraumatic.  Eyes:     General: No scleral icterus.    Conjunctiva/sclera: Conjunctivae normal.     Pupils: Pupils are equal, round, and reactive to light.  Neck:     Vascular: No JVD.     Trachea: No tracheal deviation.   Cardiovascular:     Rate and Rhythm: Normal rate and regular rhythm.     Heart sounds: Normal heart sounds. No murmur heard. Pulmonary:     Effort: Pulmonary effort is normal. No tachypnea, accessory muscle usage or respiratory distress.     Breath sounds: No stridor. No wheezing, rhonchi or rales.  Abdominal:     General: There is no distension.     Palpations: Abdomen is soft.     Tenderness: There is no abdominal tenderness.  Musculoskeletal:        General: No tenderness.     Cervical back: Neck supple.  Lymphadenopathy:     Cervical: No cervical adenopathy.  Skin:    General: Skin is warm and dry.     Capillary Refill: Capillary refill takes less than 2 seconds.     Findings: No rash.  Neurological:     Mental Status: She is alert and oriented to person, place, and time.  Psychiatric:        Behavior: Behavior normal.      Vitals:   09/19/22 1059  BP: 110/80  Pulse: 66  SpO2: 96%  Weight: 197 lb 12.8 oz (89.7 kg)  Height: 5' 5"$  (1.651 m)   96% on RA BMI Readings from Last 3 Encounters:  09/19/22 32.92 kg/m  09/12/22 33.28 kg/m  06/25/22 32.62 kg/m   Wt Readings from Last 3 Encounters:  09/19/22 197 lb 12.8 oz (89.7 kg)  09/12/22 200 lb (90.7 kg)  06/25/22 196 lb (88.9 kg)     CBC    Component Value Date/Time   WBC 7.5 05/09/2021 1011   WBC 6.9 12/27/2015 0833   RBC 5.82 (H) 05/09/2021 1011   RBC 5.46 (H) 12/27/2015 0833   HGB 16.3 (H) 05/22/2021 0844   HGB 16.3 (H) 05/22/2021 0844   HGB 15.4 05/09/2021 1011   HCT 48.0 (H) 05/22/2021 0844   HCT 48.0 (H) 05/22/2021 0844   HCT 47.9 (H) 05/09/2021 1011   PLT 211 05/09/2021 1011   MCV 82 05/09/2021 1011   MCH 26.5 (L) 05/09/2021 1011   MCH 26.7 (L) 12/27/2015 [id number]  MCHC 32.2 05/09/2021 1011   MCHC 32.2 12/27/2015 0833   RDW 13.7 05/09/2021 1011   LYMPHSABS 2,208 12/27/2015 0833   MONOABS 414 12/27/2015 0833   EOSABS 138 12/27/2015 0833   BASOSABS 69 12/27/2015 0833    Chest  Imaging: 12/11/2017 chest x-ray: Small hiatal hernia bilateral clear lungs no infiltrate. The patient's images have been independently reviewed by me.    Pulmonary Functions Testing Results:    Latest Ref Rng & Units 11/05/2019    2:00 PM  PFT Results  FVC-Pre L 2.29   FVC-Predicted Pre % 67   FVC-Post L 2.33   FVC-Predicted Post % 69   Pre FEV1/FVC % % 80   Post FEV1/FCV % % 82   FEV1-Pre L 1.83   FEV1-Predicted Pre % 70   FEV1-Post L 1.91   DLCO uncorrected ml/min/mmHg 21.75   DLCO UNC% % 104   DLCO corrected ml/min/mmHg 21.75   DLCO COR %Predicted % 104   DLVA Predicted % 129   TLC L 4.15   TLC % Predicted % 79   RV % Predicted % 80     Echocardiogram:  08/02/2019: Echocardiogram Normal LVEF grade 2 diastolic dysfunction  Myocardial perfusion Lexi scan: 08/16/2019: Normal myocardial perfusion results reviewed.  Heart Catheterization: None     Assessment & Plan:     ICD-10-CM   1. Pulmonary artery aneurysm (HCC)  I28.1 CT Angio Chest W/Cm &/Or Wo Cm    2. Lung nodule  R91.1 CT Angio Chest W/Cm &/Or Wo Cm    3. History of COVID-19  Z86.16       Discussion:  This is a 67 year old female, past medical history of COVID, mild to moderate symptoms with persistent dyspnea afterwards.  Cardiac workup with grade 2 diastolic dysfunction.  Prior pulmonary function tests were completed with a normal ratio, impaired spirometry, appears to have a prism pattern likely related to body habitus and BMI of 32.  She is on a PPI for reflux.  CT with small pulmonary nodules.  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  Plan: Repeat CTA chest to evaluate lung nodules and pulmonary artery aneurysm. Case discussed with cardiology. She has dyspnea on exertion and shortness of breath that is still present.  She is not limited in any of her activities of daily living. I expect that a portion of this is related to her ability to keep her endurance and physical activity  level. We talked about the utility of cardiopulmonary exercise testing.  She declined at this time. We will have her return in 3 months after 1 year CTA follow-up is complete in May 2024.    Current Outpatient Medications:    allopurinol (ZYLOPRIM) 100 MG tablet, Take 100 mg by mouth daily., Disp: , Rfl:    aspirin EC 81 MG tablet, Take 1 tablet (81 mg total) by mouth daily. SWALLOW WHOLE., Disp: 90 tablet, Rfl: 3   cetirizine (ZYRTEC) 10 MG tablet, Take 10 mg by mouth in the morning., Disp: , Rfl:    Cholecalciferol (VITAMIN D3) 50 MCG (2000 UT) TABS, Take 2,000 Units by mouth in the morning., Disp: , Rfl:    Colchicine (COLCRYS PO), Take 0.6 mg by mouth. Was twice daily, now once daily, then taper, Disp: , Rfl:    ezetimibe (ZETIA) 10 MG tablet, Take 0.5 tablets (5 mg total) by mouth daily., Disp: 45 tablet, Rfl: 3   fluticasone (FLONASE) 50 MCG/ACT nasal spray, Place 2 sprays into both nostrils  daily as needed for allergies., Disp: , Rfl:    hydrocortisone cream 1 %, Apply 1 application topically 2 (two) times daily as needed (skin irritation/itching/rash)., Disp: , Rfl:    pravastatin (PRAVACHOL) 40 MG tablet, TAKE 1 TABLET BY MOUTH EVERY DAY IN THE EVENING, Disp: 90 tablet, Rfl: 3   predniSONE (DELTASONE) 20 MG tablet, Take by mouth. Taper, 40 mg, 40 mg, 40 mg, then 20 mg x 3 days, Disp: , Rfl:    spironolactone (ALDACTONE) 50 MG tablet, TAKE 1 TABLET BY MOUTH EVERY DAY, Disp: 90 tablet, Rfl: 3   vortioxetine HBr (TRINTELLIX) 10 MG TABS tablet, Take 10 mg by mouth in the morning., Disp: , Rfl:    Garner Nash, DO [facility] 09/19/2022 11:39 AM

## 2022-10-03 ENCOUNTER — Telehealth: Payer: Self-pay | Admitting: *Deleted

## 2022-10-03 NOTE — Telephone Encounter (Signed)
Cpap insurance issue that was dicussed at appointment.  (I have attached messages to Dr Guadelupe Sabin last office note.

## 2022-10-03 NOTE — Progress Notes (Signed)
RE: question about the pt and her contract commercial then now medicare Received: Today New, Remus Blake, Lenna Sciara; Brandon Melnick, RN; New, Verdell Carmine,  Here is the reply I received just a bit ago regarding the billing of this cpap.  Also, sorry for the delay, the employee that was assigned to work on this has been away and we were unaware until this morning.  Message from patient svs :  Good day! With regards to this concern, Medicare does 13 month rental most of the time if not always. The reason why the patient was told that their rental period will reset is due to the guidelines of their new insurance. Most of the time, if the patient switch insurance within or in the middle of the rental period. The new insurance won't acknowledge the old insurance payments and would reset the rental back to $0. It is not because we are doing it to prolong the rental dueration at will but due to the Authorization submitted to the new insurance as well. If the new insurance states that the rental period of the machine will be 10-13 months, then that would mean that the machine's rental period would reset.  Obviously, the patient has an option to reach out to Medicare as well and verify if they will continue where the [employer] insurance [employer]) left off or if they would follow through their guidelines of 13 months rental period. If the patient doesn't want the machine to be billed to their new insurance, then they have an option to pay it off, but they will need to pay the remaining balance of the machine OOP. We can make an inquiry on our end to compute how much was paid by both the patient and the old insurance and compute how much their Convert to Purchase price is, just let us know. Thank you!  Thank you,  Brad New     Previous Messages    ----- Message ----- From: Darlina Guys Sent: 09/18/2022   2:24 PM EST To: Darlina Guys; Miquel Dunn; Brandon Melnick, RN Subject: RE:  question about the pt and her contract c*  Hi Katharine Look, I see that [facility] sent an inquiry to our billing department on the 16th but I don't have an update yet.  I sent a second request today. Thank you! Melissa  ----- Message ----- From: Brandon Melnick, RN Sent: 09/18/2022   1:35 PM EST To: Darlina Guys Subject: question about the pt and her contract comme*  Hi Melissa,  Have you been in touch with patient?  Some confusion about her getting a new contract due to getting medicare when almost finished with paying for machine when had commercial insurance?  Let me know or contact me if need more clarification.  Thanks  [employer]. [place] Female, 67 y.o., [birth year] MRN: [id number]

## 2022-10-09 ENCOUNTER — Encounter: Payer: Self-pay | Admitting: Pulmonary Disease

## 2022-10-10 NOTE — Telephone Encounter (Signed)
Dr. Valeta Harms, please see pts email regarding her CTA. Please confirm if it is ok if pt has her scan in April if May is unavailable prior to her appt with Dr. Marlou Porch (5/10).    OV notes from 09/19/22:  Plan: Repeat CTA chest to evaluate lung nodules and pulmonary artery aneurysm. Case discussed with cardiology. She has dyspnea on exertion and shortness of breath that is still present.  She is not limited in any of her activities of daily living. I expect that a portion of this is related to her ability to keep her endurance and physical activity level. We talked about the utility of cardiopulmonary exercise testing.  She declined at this time. We will have her return in 3 months after 1 year CTA follow-up is complete in May 2024.

## 2022-10-11 ENCOUNTER — Encounter: Payer: Self-pay | Admitting: Neurology

## 2022-10-11 NOTE — Telephone Encounter (Signed)
PCC's- can you please schedule her CTA for early May 2024 (before appt with 12/06/22)

## 2022-10-15 NOTE — Telephone Encounter (Signed)
Spoke to patient sent a community message this am .

## 2022-10-17 NOTE — Telephone Encounter (Signed)
Noted  

## 2022-10-22 ENCOUNTER — Encounter (INDEPENDENT_AMBULATORY_CARE_PROVIDER_SITE_OTHER): Payer: Medicare Other | Admitting: Cardiology

## 2022-10-22 DIAGNOSIS — R001 Bradycardia, unspecified: Secondary | ICD-10-CM

## 2022-10-22 DIAGNOSIS — I471 Supraventricular tachycardia, unspecified: Secondary | ICD-10-CM | POA: Diagnosis not present

## 2022-10-29 ENCOUNTER — Ambulatory Visit: Payer: Medicare Other | Attending: Cardiology

## 2022-10-29 DIAGNOSIS — R001 Bradycardia, unspecified: Secondary | ICD-10-CM

## 2022-10-29 DIAGNOSIS — I471 Supraventricular tachycardia, unspecified: Secondary | ICD-10-CM

## 2022-10-29 NOTE — Telephone Encounter (Signed)
Please see the MyChart message reply(ies) for my assessment and plan.   Checking a 3 day Zio patch monitor.  Candee Furbish, MD    This patient gave consent for this Medical Advice Message and is aware that it may result in a bill to their insurance company, as well as the possibility of receiving a bill for a co-payment or deductible. They are an established patient, but are not seeking medical advice exclusively about a problem treated during an in person or video visit in the last seven days. I did not recommend an in person or video visit within seven days of my reply.    I spent a total of 5 minutes cumulative time within 7 days through [employer].  Candee Furbish, MD

## 2022-10-29 NOTE — Progress Notes (Unsigned)
Enrolled for [employer] to mail a ZIO XT long term holter monitor to the patients address on file.  

## 2022-11-07 DIAGNOSIS — R001 Bradycardia, unspecified: Secondary | ICD-10-CM

## 2022-11-07 DIAGNOSIS — I471 Supraventricular tachycardia, unspecified: Secondary | ICD-10-CM

## 2022-11-28 ENCOUNTER — Ambulatory Visit (HOSPITAL_BASED_OUTPATIENT_CLINIC_OR_DEPARTMENT_OTHER)
Admission: RE | Admit: 2022-11-28 | Discharge: 2022-11-28 | Disposition: A | Discharge status: Home / Self Care | Payer: Medicare Other | Source: Ambulatory Visit | Attending: Pulmonary Disease | Admitting: Pulmonary Disease

## 2022-11-28 ENCOUNTER — Encounter (HOSPITAL_BASED_OUTPATIENT_CLINIC_OR_DEPARTMENT_OTHER): Payer: Self-pay

## 2022-11-28 DIAGNOSIS — R911 Solitary pulmonary nodule: Secondary | ICD-10-CM | POA: Insufficient documentation

## 2022-11-28 DIAGNOSIS — I281 Aneurysm of pulmonary artery: Secondary | ICD-10-CM | POA: Diagnosis present

## 2022-11-28 LAB — POCT I-STAT CREATININE: Creatinine, Ser: 1.1 mg/dL — ABNORMAL HIGH (ref 0.44–1.00)

## 2022-11-28 MED ORDER — IOHEXOL 350 MG/ML SOLN
100.0000 mL | Freq: Once | INTRAVENOUS | Status: AC | PRN
  Administered 2022-11-28: 75 mL via INTRAVENOUS

## 2022-11-30 NOTE — Progress Notes (Signed)
Sarah, seeing you this week ct stable. Will need annual CTA.   Thanks,  BLI  Josephine Igo, DO [facility] 11/30/2022 6:36 PM

## 2022-12-06 ENCOUNTER — Ambulatory Visit: Payer: Medicare Other | Attending: Cardiology | Admitting: Cardiology

## 2022-12-06 ENCOUNTER — Ambulatory Visit (INDEPENDENT_AMBULATORY_CARE_PROVIDER_SITE_OTHER): Payer: Medicare Other | Admitting: Acute Care

## 2022-12-06 ENCOUNTER — Encounter: Payer: Self-pay | Admitting: Cardiology

## 2022-12-06 ENCOUNTER — Encounter: Payer: Self-pay | Admitting: Acute Care

## 2022-12-06 VITALS — BP 130/82 | HR 77 | Temp 97.7°F | Ht 68.0 in | Wt 200.0 lb

## 2022-12-06 VITALS — BP 127/78 | HR 70 | Ht 68.0 in | Wt 200.0 lb

## 2022-12-06 DIAGNOSIS — I2510 Atherosclerotic heart disease of native coronary artery without angina pectoris: Secondary | ICD-10-CM | POA: Diagnosis not present

## 2022-12-06 DIAGNOSIS — R0609 Other forms of dyspnea: Secondary | ICD-10-CM | POA: Insufficient documentation

## 2022-12-06 DIAGNOSIS — I471 Supraventricular tachycardia, unspecified: Secondary | ICD-10-CM

## 2022-12-06 DIAGNOSIS — Z6830 Body mass index (BMI) 30.0-30.9, adult: Secondary | ICD-10-CM

## 2022-12-06 DIAGNOSIS — I729 Aneurysm of unspecified site: Secondary | ICD-10-CM | POA: Diagnosis not present

## 2022-12-06 NOTE — Patient Instructions (Signed)
Medication Instructions:  The current medical regimen is effective;  continue present plan and medications.  *If you need a refill on your cardiac medications before your next appointment, please call your pharmacy*  Testing/Procedures: Your physician has requested that you have an echocardiogram. Echocardiography is a painless test that uses sound waves to create images of your heart. It provides your doctor with information about the size and shape of your heart and how well your heart's chambers and valves are working. This procedure takes approximately one hour. There are no restrictions for this procedure. Please do NOT wear cologne, perfume, aftershave, or lotions (deodorant is allowed). Please arrive 15 minutes prior to your appointment time.  Follow-Up: At [facility], you and your health needs are our priority.  As part of our continuing mission to provide you with exceptional heart care, we have created designated Provider Care Teams.  These Care Teams include your primary Cardiologist (physician) and Advanced Practice Providers (APPs -  Physician Assistants and Nurse Practitioners) who all work together to provide you with the care you need, when you need it.  We recommend signing up for the patient portal called "MyChart".  Sign up information is provided on this After Visit Summary.  MyChart is used to connect with patients for Virtual Visits (Telemedicine).  Patients are able to view lab/test results, encounter notes, upcoming appointments, etc.  Non-urgent messages can be sent to your provider as well.   To learn more about what you can do with MyChart, go to [email or web address].    Your next appointment:   1 year(s)  Provider:   Mark Skains, MD      

## 2022-12-06 NOTE — Progress Notes (Signed)
Cardiology Office Note:    Date:  12/06/2022   ID:  [name], DOB 07-17-1956, MRN [id number]  PCP:  Chilton Greathouse, MD   [facility] Providers Cardiologist:  Donato Schultz, MD     Referring MD: Chilton Greathouse, MD    History of Present Illness:    [name] is a 67 y.o. female  isolated pulmonary artery aneurysm.  Right heart cath showed no evidence of pulmonary hypertension.  No evidence of thrombus burden on CT.    Symptoms have been fairly longstanding shortness of breath following her ablation in 2019.  No evidence of pulmonary vein stenosis.  Pulmonary visit unremarkable.  Minimal coronary artery disease nonobstructive on CT.   At her last appointment I had lengthy discussion with her and her husband present.  Showed images of CT scan.   Had prior SVT ablation 2017, Dr. Ladona Ridgel.   Had been diagnosed with  obstructive sleep apnea and was currently on CPAP.   She reported that she was still having some shortness of breath with heavy activity.  No chest pain no syncope no fevers chills nausea vomiting.   Today, she reports that she is doing ok overall but she is still having the shortness of breath pretty much all of the time. This has been an issue for her for several years now. We discussed the results of her CT and the pulmonary aneurysm in depth. We discussed options for a plan for further CT in order to monitor the development of the aneurysm.    She was concerned whether or not she should stop going up and down stairs or line dancing since she gets the shortness of breath. Her concern was that pushing through the discomfort may make her condition worse.    Today 12/06/2022-still short of breath.  Going to [employer].  Deconditioning has been discussed.  She is going to have the weight and wellness.  Sees pulmonary clinic.  They are following pulmonary nodules as well.  Heart catheterization reassuring with no evidence of pulmonary hypertension.   Dilated pulmonary artery reviewed again.  Past Medical History:  Diagnosis Date   Anxiety    Daytime somnolence 04/06/2013   DDD (degenerative disc disease), lumbar    Depression    Hypercholesteremia    Hypertension    OSA on CPAP 04/06/2013   Sleep apnea    Urinary incontinence     Past Surgical History:  Procedure Laterality Date   ABLATION  09/29/2017   BACK SURGERY  04/06/2019   BREAST EXCISIONAL BIOPSY Left    CATARACT EXTRACTION Bilateral    ELECTROPHYSIOLOGIC STUDY N/A 01/01/2016   Procedure: SVT Ablation;  Surgeon: Marinus Maw, MD;  Location: [facility] INVASIVE CV LAB;  Service: Cardiovascular;  Laterality: N/A;   HAND SURGERY Right    KNEE ARTHROPLASTY Left    NASAL SEPTUM SURGERY     RADICAL HYSTERECTOMY  2000   RIGHT HEART CATH N/A 05/22/2021   Procedure: RIGHT HEART CATH;  Surgeon: Swaziland, Peter M, MD;  Location[facility] INVASIVE CV LAB;  Service: Cardiovascular;  Laterality: N/A;   TONSILLECTOMY AND ADENOIDECTOMY      Current Medications: Current Meds  Medication Sig   allopurinol (ZYLOPRIM) 100 MG tablet Take 100 mg by mouth daily.   aspirin EC 81 MG tablet Take 1 tablet (81 mg total) by mouth daily. SWALLOW WHOLE.   cetirizine (ZYRTEC) 10 MG tablet Take 10 mg by mouth in the morning.   Cholecalciferol (VITAMIN D3) 50  MCG (2000 UT) TABS Take 2,000 Units by mouth in the morning.   Colchicine (COLCRYS PO) Take 0.6 mg by mouth. Was twice daily, now once daily, then taper   ezetimibe (ZETIA) 10 MG tablet Take 0.5 tablets (5 mg total) by mouth daily.   fluticasone (FLONASE) 50 MCG/ACT nasal spray Place 2 sprays into both nostrils daily as needed for allergies.   pravastatin (PRAVACHOL) 40 MG tablet TAKE 1 TABLET BY MOUTH EVERY DAY IN THE EVENING   spironolactone (ALDACTONE) 50 MG tablet TAKE 1 TABLET BY MOUTH EVERY DAY   venlafaxine XR (EFFEXOR-XR) 75 MG 24 hr capsule Take 75 mg by mouth daily.     Allergies:   Other and Rosuvastatin   Social History   Socioeconomic  History   Marital status: Married    Spouse name: Not on file   Number of children: 2   Years of education: Not on file   Highest education level: Not on file  Occupational History   Not on file  Tobacco Use   Smoking status: Never   Smokeless tobacco: Never  Vaping Use   Vaping Use: Never used  Substance and Sexual Activity   Alcohol use: Not Currently    Alcohol/week: 0.0 standard drinks of alcohol   Drug use: No   Sexual activity: Yes    Partners: Male    Birth control/protection: Surgical    Comment: hysterectomy  Other Topics Concern   Not on file  Social History Narrative   Not on file   Social Determinants of Health   Financial Resource Strain: Not on file  Food Insecurity: Not on file  Transportation Needs: Not on file  Physical Activity: Not on file  Stress: Not on file  Social Connections: Not on file     Family History: The patient's family history includes Diabetes in her maternal grandmother; Heart disease in her father; Hyperlipidemia in her father and mother; Hypertension in her father; Stroke in her father. There is no history of Sleep apnea.  ROS:   Please see the history of present illness.     All other systems reviewed and are negative.  EKGs/Labs/Other Studies Reviewed:    The following studies were reviewed today:  CTA chest 11/28/22:   Stable fusiform dilatation of the main pulmonary artery segment measuring up to 4.9 cm in maximum perpendicular diameter. This measurement is stable when making independent measurements at the same level on all prior studies dating back to 2022. No evidence of acute or chronic pulmonary embolism. There is no definitive follow-up algorithm for pulmonary artery aneurysmal disease. Appropriate follow-up likely would be annual CTA or MRA similar to follow-up for a similar-sized thoracic aortic aneurysm. 2. Normal variant left SVC. 3. Known roughly 3 cm right thyroid nodule visualized by CT. This has been  sampled by fine needle aspiration in the past and follow-up by ultrasound. See prior reports and cytology. 4. Stable large hiatal hernia.  Coronary CT scan 05/04/2021: Aorta: Normal size. No calcifications. No dissection. Aortic atherosclerosis noted.   Main Pulmonary Artery: Severely dilated main pulmonary artery 54 mm.   Aortic Valve:  Tri-leaflet.  No calcifications.   Coronary Arteries:  Normal coronary origin.  Left dominance.   Coronary Calcium Score:   Left main: 0   Left anterior descending artery: 46   Left circumflex artery: 0   Right coronary artery: 0   Total: 46   Percentile: 77th for age, sex, and race matched control.   RCA is a small  non-dominant artery that gives rise to PDA and PLA. There is no significant plaque.   Left main is a large artery that gives rise to LAD, ramus intermedius, and LCX arteries. There is no significant plaque.   LAD is a large vessel that gives rise to one large D1 Branch. There is minimal non-obstructive calcified plaque (< 25%) in the proximal and mid LAD.   LCX is a non-dominant artery that gives rise to one large OM1 branch. 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 There is no significant plaque. Notable misregistration artifact (as below) prominent in the mid vessel.   Ramus intermedius has no significant plaque (small vessel).   Other findings:   Normal variant pulmonary vein drainage into the left atrium (LUPV and LLPV fuse to a common ostium).   - RSPV 21 mm X 18 mm   - RLPV: 14 mm X 14 mm (variable sizing through systolic and diastolic phases argues against stenosis)   - LCPV: 26 mm X 20 mm   Normal left atrial appendage without a thrombus.   Misregistration artifact related to patient motion noted.   Severely dilated IVC 20 mm   At least mild right ventricular dilation; study not optimized for right ventricular imaging.   PFO  and small atrial septal aneurysm noted.   Extra-cardiac findings: See attached radiology report for non-cardiac structures.   IMPRESSION: 1. Coronary calcium score of 46. This was 77th percentile for age, sex, and race matched control.   2. Normal coronary origin with left dominance.   3. Aortic atherosclerosis noted.   4. CAD-RADS 1. Minimal non-obstructive CAD (1-24%). Consider non-atherosclerotic causes of chest pain. Consider preventive therapy and risk factor modification.   5. Severely dilated main pulmonary artery 54 mm.   6.  Study not-consistent with ostial pulmonary vein stenosis.   7. PFO and small atrial septal aneurysm noted.   8. 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     Exercise Sestamibi stress test 11/06/2020: Exercise nuclear stress test was performed using Bruce protocol. Patient reached 7.8 METS, and 83% of age predicted maximum heart rate. Exercise capacity was fair. No chest pain reported. Heart rate and hemodynamic response were normal. Stress EKG revealed no ischemic changes. SPECT images show very small sized, mild intensity, partially reversible perfusion defect in basal inferolateral myocardium. Stress LVEF 65%. Low risk study.   Echo 10/30/2020: Left ventricle cavity is normal in size. Mild concentric hypertrophy of  the left ventricle. Normal global wall motion. Normal LV systolic function  with EF 60%. Doppler evidence of grade II (pseudonormal) diastolic  dysfunction, elevated LAP.  Mild (Grade I) aortic regurgitation.  Mild tricuspid regurgitation.  No evidence of pulmonary hypertension.  No significant change compared to previous study on 08/02/2019.     Recent Labs: 11/28/2022: Creatinine, Ser 1.10  Recent Lipid Panel    Component Value Date/Time   CHOL 130 11/14/2021 0912   TRIG 97 11/14/2021 0912   HDL 55 11/14/2021 0912   CHOLHDL 2.4 11/14/2021 0912    LDLCALC 57 11/14/2021 0912         Physical Exam:    VS:  BP 127/78 (BP Location: Left Arm, Patient Position: Sitting, Cuff Size: Normal)   Pulse 70   Ht 5\' 8"  (1.727 m)   Wt 200 lb (90.7 kg)   BMI 30.41 kg/m  Wt Readings from Last 3 Encounters:  12/06/22 200 lb (90.7 kg)  12/06/22 200 lb (90.7 kg)  09/19/22 197 lb 12.8 oz (89.7 kg)     GEN:  Well nourished, well developed in no acute distress HEENT: Normal NECK: No JVD; No carotid bruits LYMPHATICS: No lymphadenopathy CARDIAC: RRR, no murmurs, rubs, gallops RESPIRATORY:  Clear to auscultation without rales, wheezing or rhonchi  ABDOMEN: Soft, non-tender, non-distended MUSCULOSKELETAL:  No edema; No deformity  SKIN: Warm and dry NEUROLOGIC:  Alert and oriented x 3 PSYCHIATRIC:  Normal affect   ASSESSMENT:    1. Coronary artery disease involving native coronary artery of native heart without angina pectoris   2. Dyspnea on exertion   3. PSVT (paroxysmal supraventricular tachycardia) [I47.10]    PLAN:    In order of problems listed above:   Dilated pulmonary artery Stable on recent CT scan that was noncontrasted looking at lung fields.  Reassurance.  Pulmonary clinic has been following pulmonary nodules.  They are going to check a noncontrasted CT next year.    Shortness of breath Longstanding.  Pulmonary has evaluated.  We have evaluated.  She does have grade 2 diastolic dysfunction on her most recent echocardiogram in 2022.    She does not have pulmonary hypertension from heart catheterization.  Continuing with spironolactone 50.  I will go ahead and recheck echocardiogram.  Agree with healthy weight and wellness.  Daily exercise.   Coronary artery calcification/nonobstructive coronary disease Now on Zetia.  Also on pravastatin 40.  Doing very well.  Excellent cholesterol readings.  Continue.  Verify that she is on pravastatin.            Medication Adjustments/Labs and Tests Ordered: Current medicines  are reviewed at length with the patient today.  Concerns regarding medicines are outlined above.  Orders Placed This Encounter  Procedures   ECHOCARDIOGRAM COMPLETE   No orders of the defined types were placed in this encounter.   Patient Instructions  Medication Instructions:  The current medical regimen is effective;  continue present plan and medications.  *If you need a refill on your cardiac medications before your next appointment, please call your pharmacy*  Testing/Procedures: Your physician has requested that you have an echocardiogram. Echocardiography is a painless test that uses sound waves to create images of your heart. It provides your doctor with information about the size and shape of your heart and how well your heart's chambers and valves are working. This procedure takes approximately one hour. There are no restrictions for this procedure. Please do NOT wear cologne, perfume, aftershave, or lotions (deodorant is allowed). Please arrive 15 minutes prior to your appointment time.   Follow-Up: At [facility], you and your health needs are our priority.  As part of our continuing mission to provide you with exceptional heart care, we have created designated Provider Care Teams.  These Care Teams include your primary Cardiologist (physician) and Advanced Practice Providers (APPs -  Physician Assistants and Nurse Practitioners) who all work together to provide you with the care you need, when you need it.  We recommend signing up for the patient portal called "MyChart".  Sign up information is provided on this After Visit Summary.  MyChart is used to connect with patients for Virtual Visits (Telemedicine).  Patients are able to view lab/test results, encounter notes, upcoming appointments, etc.  Non-urgent messages can be sent to your provider as well.   To learn more about what you can do with MyChart, go  to [email or web address].    Your next appointment:   1  year(s)  Provider:   Donato Schultz, MD        Signed, Donato Schultz, MD  12/06/2022 5:24 PM    [facility]

## 2022-12-06 NOTE — Progress Notes (Signed)
History of Present Illness [name] is a 67 y.o. female with past medical history of OSA on CPAP, hypertension, hyper bulimia. Referred for evaluation of dyspnea 08/2019 to Dr. Tonia Brooms.She does have a past medical history of Covid with Mild to moderate symptoms   Synopsis 67 year old female, past medical history of COVID 06/2019 with  mild to moderate symptoms with persistent dyspnea afterwards. Cardiac workup with grade 2 diastolic dysfunction. Prior pulmonary function tests were completed with a normal ratio, impaired spirometry, appears to have a prism pattern likely related to body habitus and BMI of 32. She is on a PPI for reflux. CT with small pulmonary nodules. 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 .Follow up CTA was ordered to evaluate lung nodules and pulmonary artery aneurysm. Dr. Tonia Brooms suspects her DOE is related to her in ability to maintain  her endurance and physical activity level. He spoke with her about the utility of cardiopulmonary exercise testing.  She declined  this in 08/2020. CTA was ordered. Pt. Is here for follow up.    12/06/2022 Pt. Presents after CT Chest . She states she has been doing well. She does still have dyspnea with exertion. No wheezing .She line dances 3 times a week and weight loss has been difficult.Plan is to continue to follow her pulmonary artery aneurysm with annual imaging. CTA Shows this is stable. We will continue annual imaging.  I have also referred her to [facility] for weight loss assistance .She will follow up with [name] in 12 months after her scan.  Test Results: Cardiac Cath 05/22/2021 Fick Cardiac Output 4.72 L/min  Fick Cardiac Output Index 2.44 (L/min)/BSA  RA A Wave 7 mmHg  RA V Wave 2 mmHg  RA Mean 3 mmHg  RV Systolic Pressure 32 mmHg  RV Diastolic Pressure 0 mmHg  RV EDP 3 mmHg  PA Systolic Pressure 24 mmHg  PA Diastolic Pressure 3 mmHg  PA Mean 13 mmHg  PW A Wave 11 mmHg  PW V Wave 10 mmHg   PW Mean 9 mmHg  QP/QS 1  TPVR Index 5.33 HRUI    Stable fusiform dilatation of the main pulmonary artery segment measuring up to 4.9 cm in maximum perpendicular diameter. This measurement is stable when making independent measurements at the same level on all prior studies dating back to 2022. No evidence of acute or chronic pulmonary embolism. There is no definitive follow-up algorithm for pulmonary artery aneurysmal disease. Appropriate follow-up likely would be annual CTA or MRA similar to follow-up for a similar-sized thoracic aortic aneurysm. 2. Normal variant left SVC. 3. Known roughly 3 cm right thyroid nodule visualized by CT. This has been sampled by fine needle aspiration in the past and follow-up by ultrasound. See prior reports and cytology. 4. Stable large hiatal hernia.  12/11/2017 chest x-ray: Small hiatal hernia bilateral clear lungs no infiltrate. The patient's images have been independently reviewed by me.    Pulmonary Functions Testing Results:     Latest Ref Rng & Units 11/05/2019    2:00 PM  PFT Results  FVC-Pre L 2.29   FVC-Predicted Pre % 67   FVC-Post L 2.33   FVC-Predicted Post % 69   Pre FEV1/FVC % % 80   Post FEV1/FCV % % 82   FEV1-Pre L 1.83   FEV1-Predicted Pre % 70   FEV1-Post L 1.91   DLCO uncorrected ml/min/mmHg 21.75   DLCO UNC% % 104   DLCO corrected ml/min/mmHg 21.75  DLCO COR %Predicted % 104   DLVA Predicted % 129   TLC L 4.15   TLC % Predicted % 79   RV % Predicted % 80      Echocardiogram:  08/02/2019: Echocardiogram Normal LVEF grade 2 diastolic dysfunction   Myocardial perfusion Lexi scan: 08/16/2019: Normal myocardial perfusion results reviewed.       Latest Ref Rng & Units 05/22/2021    8:44 AM 05/09/2021   10:11 AM 01/17/2016   12:06 PM  CBC  WBC 3.4 - 10.8 x10E3/uL  7.5    Hemoglobin 12.0 - 15.0 g/dL 82.9 - 56.2 g/dL 13.0    86.5  78.4  69.6   Hematocrit 36.0 - 46.0 % 36.0 - 46.0 % 48.0    48.0  47.9  55.0    Platelets 150 - 450 x10E3/uL  211         Latest Ref Rng & Units 11/28/2022    9:40 AM 05/22/2021    8:44 AM 05/09/2021   10:11 AM  BMP  Glucose 70 - 99 mg/dL   92   BUN 8 - 27 mg/dL   16   Creatinine 2.95 - 1.00 mg/dL 2.84   1.32   BUN/Creat Ratio 12 - 28   15   Sodium 135 - 145 mmol/L 135 - 145 mmol/L  140    140  138   Potassium 3.5 - 5.1 mmol/L 3.5 - 5.1 mmol/L  4.5    4.5  5.0   Chloride 96 - 106 mmol/L   101   CO2 20 - 29 mmol/L   23   Calcium 8.7 - 10.3 mg/dL   44.0     BNP    Component Value Date/Time   BNP 62 11/05/2019 1615    ProBNP    Component Value Date/Time   PROBNP 86 12/15/2020 0945    PFT    Component Value Date/Time   FEV1PRE 1.83 11/05/2019 1400   FEV1POST 1.91 11/05/2019 1400   FVCPRE 2.29 11/05/2019 1400   FVCPOST 2.33 11/05/2019 1400   TLC 4.15 11/05/2019 1400   DLCOUNC 21.75 11/05/2019 1400   PREFEV1FVCRT 80 11/05/2019 1400   PSTFEV1FVCRT 82 11/05/2019 1400    CT Angio Chest W/Cm &/Or Wo Cm  Result Date: 11/28/2022 CLINICAL DATA:  Dilated main pulmonary artery by prior imaging. EXAM: CT ANGIOGRAPHY CHEST WITH CONTRAST TECHNIQUE: Multidetector CT imaging of the chest was performed using the standard protocol during bolus administration of intravenous contrast. Multiplanar CT image reconstructions and MIPs were obtained to evaluate the vascular anatomy. RADIATION DOSE REDUCTION: This exam was performed according to the departmental dose-optimization program which includes automated exposure control, adjustment of the mA and/or kV according to patient size and/or use of iterative reconstruction technique. CONTRAST:  75mL OMNIPAQUE IOHEXOL 350 MG/ML SOLN COMPARISON:  CT of the chest without contrast on 11/28/2021, coronary CTA on 05/04/2021, CT of the chest without contrast on 12/15/2020, calcium score study on 11/20/2020. FINDINGS: Cardiovascular: Stable fusiform dilatation of the main pulmonary artery segment measuring up to 4.9 cm in maximum  perpendicular diameter. This measurement is stable when making independent measurements at the same level on all prior studies dating back to 2022. No associated thrombus in the pulmonary artery. After bifurcation of the main pulmonary artery, right and left pulmonary arteries as well as intrapulmonary branches are of normal caliber and demonstrate no evidence of acute or chronic thrombus. No evidence of pulmonary arteriovenous malformations or venous drainage anomalies. Pulmonary venous drainage into the  left atrium demonstrates normal anatomy. Normal variant left SVC forms from the left jugular and subclavian veins and drains into the coronary sinus. Normally patent right SVC. The thoracic aorta is normal in caliber and demonstrates no atherosclerosis. Visualized proximal great vessels demonstrate normal patency and branching anatomy. The heart size overall is within normal limits with some mild prominence of left ventricular chamber size. There is some thinning of the myocardium at the left ventricular apex. No pericardial fluid identified. Mediastinum/Nodes: No enlarged mediastinal, hilar, or axillary lymph nodes. Known roughly 3 cm right thyroid nodule visualized by CT. This has been sampled by fine needle aspiration in the past and follow-up by ultrasound. See prior reports and cytology. The trachea and esophagus demonstrate no significant findings. Stable large hiatal hernia. Lungs/Pleura: There is no evidence of pulmonary edema, consolidation, pneumothorax, nodule or pleural fluid. Upper Abdomen: No acute abnormality. Musculoskeletal: No chest wall abnormality. No acute or significant osseous findings. Review of the MIP images confirms the above findings. IMPRESSION: 1. Stable fusiform dilatation of the main pulmonary artery segment measuring up to 4.9 cm in maximum perpendicular diameter. This measurement is stable when making independent measurements at the same level on all prior studies dating back to  2022. No evidence of acute or chronic pulmonary embolism. There is no definitive follow-up algorithm for pulmonary artery aneurysmal disease. Appropriate follow-up likely would be annual CTA or MRA similar to follow-up for a similar-sized thoracic aortic aneurysm. 2. Normal variant left SVC. 3. Known roughly 3 cm right thyroid nodule visualized by CT. This has been sampled by fine needle aspiration in the past and follow-up by ultrasound. See prior reports and cytology. 4. Stable large hiatal hernia. Electronically Signed   By: Irish Lack M.D.   On: 11/28/2022 10:12   LONG TERM MONITOR (3-14 DAYS)  Result Date: 11/25/2022   Sinus rhythm average heart rate 89 bpm.   Several atrial tachycardia runs noted, maximum rate for 4 beats 167 bpm, longest 16 seconds with average rate of 94 bpm.   No atrial fibrillation.   Frequent premature atrial contractions 16%, rare PVCs.   Overall reassuring monitor. Patch Wear Time:  3 days and 10 hours (2024-04-11T22:38:38-0400 to 2024-04-15T09:28:11-398) Patient had a min HR of 41 bpm, max HR of 167 bpm, and avg HR of 69 bpm. Predominant underlying rhythm was Sinus Rhythm. 225 Supraventricular Tachycardia runs occurred, the run with the fastest interval lasting 4 beats with a max rate of 167 bpm, the longest lasting 15.7 secs with an avg rate of 94 bpm. Isolated SVEs were frequent (16.4%, 57389), SVE Couplets were occasional (1.0%, 1825), and SVE Triplets were occasional (1.1%, 1229). Isolated VEs were rare (<1.0%), and no VE Couplets or VE Triplets were present.     Past medical hx Past Medical History:  Diagnosis Date   Anxiety    Daytime somnolence 04/06/2013   DDD (degenerative disc disease), lumbar    Depression    Hypercholesteremia    Hypertension    OSA on CPAP 04/06/2013   Sleep apnea    Urinary incontinence      Social History   Tobacco Use   Smoking status: Never   Smokeless tobacco: Never  Vaping Use   Vaping Use: Never used  Substance Use  Topics   Alcohol use: Not Currently    Alcohol/week: 0.0 standard drinks of alcohol   Drug use: No    Ms.Nuncio reports that she has never smoked. She has never used smokeless tobacco. She reports that  she does not currently use alcohol. She reports that she does not use drugs.  Tobacco Cessation: Never smoker    Past surgical hx, Family hx, Social hx all reviewed.  Current Outpatient Medications on File Prior to Visit  Medication Sig   allopurinol (ZYLOPRIM) 100 MG tablet Take 100 mg by mouth daily.   aspirin EC 81 MG tablet Take 1 tablet (81 mg total) by mouth daily. SWALLOW WHOLE.   cetirizine (ZYRTEC) 10 MG tablet Take 10 mg by mouth in the morning.   Cholecalciferol (VITAMIN D3) 50 MCG (2000 UT) TABS Take 2,000 Units by mouth in the morning.   Colchicine (COLCRYS PO) Take 0.6 mg by mouth. Was twice daily, now once daily, then taper   ezetimibe (ZETIA) 10 MG tablet Take 0.5 tablets (5 mg total) by mouth daily.   fluticasone (FLONASE) 50 MCG/ACT nasal spray Place 2 sprays into both nostrils daily as needed for allergies.   hydrocortisone cream 1 % Apply 1 application topically 2 (two) times daily as needed (skin irritation/itching/rash).   pravastatin (PRAVACHOL) 40 MG tablet TAKE 1 TABLET BY MOUTH EVERY DAY IN THE EVENING   spironolactone (ALDACTONE) 50 MG tablet TAKE 1 TABLET BY MOUTH EVERY DAY   venlafaxine XR (EFFEXOR-XR) 75 MG 24 hr capsule Take 75 mg by mouth daily.   vortioxetine HBr (TRINTELLIX) 10 MG TABS tablet Take 10 mg by mouth in the morning. (Patient not taking: Reported on 12/06/2022)   No current facility-administered medications on file prior to visit.     Allergies  Allergen Reactions   Other Itching    Antiseptic Solution   Rosuvastatin Other (See Comments)    Review Of Systems:  Constitutional:   No  weight loss, night sweats,  Fevers, chills, fatigue, or  lassitude.  HEENT:   No headaches,  Difficulty swallowing,  Tooth/dental problems, or  Sore  throat,                No sneezing, itching, ear ache, nasal congestion, post nasal drip,   CV:  No chest pain,  Orthopnea, PND, swelling in lower extremities, anasarca, dizziness, palpitations, syncope.   GI  No heartburn, indigestion, abdominal pain, nausea, vomiting, diarrhea, change in bowel habits, loss of appetite, bloody stools.   Resp: + shortness of breath with exertion less at rest.  No excess mucus, no productive cough,  No non-productive cough,  No coughing up of blood.  No change in color of mucus.  No wheezing.  No chest wall deformity  Skin: no rash or lesions.  GU: no dysuria, change in color of urine, no urgency or frequency.  No flank pain, no hematuria   MS:  No joint pain or swelling.  + decreased range of motion/ mobility.  No back pain.  Psych:  No change in mood or affect. No depression or anxiety.  No memory loss.   Vital Signs BP 130/82 (BP Location: Left Arm, Patient Position: Sitting, Cuff Size: Normal)   Pulse 77   Temp 97.7 F (36.5 C) (Oral)   Ht 5\' 8"  (1.727 m)   Wt 200 lb (90.7 kg)   SpO2 100%   BMI 30.41 kg/m    Physical Exam:  General- No distress,  A&Ox3, pleasant  ENT: No sinus tenderness, TM clear, pale nasal mucosa, no oral exudate,no post nasal drip, no LAN Cardiac: S1, S2, regular rate and rhythm, no murmur Chest: No wheeze/ rales/ dullness; no accessory muscle use, no nasal flaring, no sternal retractions Abd.: Soft Non-tender,  ND, BS +, Body mass index is 30.41 kg/m.  Ext: No clubbing cyanosis, edema Neuro:  normal strength, MAE x 4, A&O x 3 Skin: No rashes, warm and dry, no lesions  Psych: normal mood and behavior   Assessment/Plan Dyspnea on exertion  Body mass index is 30.41 kg/m.  Stable fusiform dilatation of the main pulmonary artery segment measuring up to 4.9 cm in maximum perpendicular diameter.  Plan We will continue to follow your aneurysm with annual CTA. Next due 11/2023. Follow up with cardiology as you are  already doing. We will refer you to [facility] for [facility]. You will get a call to schedule this.  Follow up in 12 months after CT Chest to review results. Please contact office for sooner follow up if symptoms do not improve or worsen or seek emergency care  I spent 40 minutes dedicated to the care of this patient on the date of this encounter to include pre-visit review of records, face-to-face time with the patient discussing conditions above, post visit ordering of testing, clinical documentation with the electronic health record, making appropriate referrals as documented, and communicating necessary information to the patient's healthcare team.       Bevelyn Ngo, NP 12/06/2022  10:46 AM

## 2022-12-06 NOTE — Patient Instructions (Addendum)
It is good to see you today. We will continue to follow your aneurysm with annual CTA. Next due 11/2023. Follow up with cardiology as you are already doing. We will refer you to [facility] for weight loss. You will get a call to schedule this.  Follow up in 12 months after CT Chest to review results. Please contact office for sooner follow up if symptoms do not improve or worsen or seek emergency care

## 2022-12-13 ENCOUNTER — Other Ambulatory Visit: Payer: Self-pay | Admitting: Cardiology

## 2022-12-19 NOTE — Telephone Encounter (Signed)
Wt. Management referral ordered and status is currently "pending". Pt notified. Nothing further needed at this time.

## 2023-01-02 ENCOUNTER — Emergency Department (HOSPITAL_COMMUNITY)
Admission: EM | Admit: 2023-01-02 | Discharge: 2023-01-02 | Disposition: A | Discharge status: Home / Self Care | Payer: Medicare Other | Attending: Emergency Medicine | Admitting: Emergency Medicine

## 2023-01-02 ENCOUNTER — Encounter (HOSPITAL_COMMUNITY): Payer: Self-pay

## 2023-01-02 ENCOUNTER — Emergency Department (HOSPITAL_COMMUNITY): Payer: Medicare Other

## 2023-01-02 ENCOUNTER — Other Ambulatory Visit: Payer: Self-pay

## 2023-01-02 DIAGNOSIS — R200 Anesthesia of skin: Secondary | ICD-10-CM | POA: Insufficient documentation

## 2023-01-02 DIAGNOSIS — I10 Essential (primary) hypertension: Secondary | ICD-10-CM | POA: Insufficient documentation

## 2023-01-02 DIAGNOSIS — R0602 Shortness of breath: Secondary | ICD-10-CM

## 2023-01-02 LAB — BRAIN NATRIURETIC PEPTIDE: B Natriuretic Peptide: 25.8 pg/mL (ref 0.0–100.0)

## 2023-01-02 LAB — CBC
HCT: 49.5 % — ABNORMAL HIGH (ref 36.0–46.0)
Hemoglobin: 15.3 g/dL — ABNORMAL HIGH (ref 12.0–15.0)
MCH: 27.2 pg (ref 26.0–34.0)
MCHC: 30.9 g/dL (ref 30.0–36.0)
MCV: 87.9 fL (ref 80.0–100.0)
Platelets: 244 10*3/uL (ref 150–400)
RBC: 5.63 MIL/uL — ABNORMAL HIGH (ref 3.87–5.11)
RDW: 15 % (ref 11.5–15.5)
WBC: 8.6 10*3/uL (ref 4.0–10.5)
nRBC: 0 % (ref 0.0–0.2)

## 2023-01-02 LAB — BASIC METABOLIC PANEL
Anion gap: 11 (ref 5–15)
BUN: 21 mg/dL (ref 8–23)
CO2: 22 mmol/L (ref 22–32)
Calcium: 9.8 mg/dL (ref 8.9–10.3)
Chloride: 106 mmol/L (ref 98–111)
Creatinine, Ser: 0.97 mg/dL (ref 0.44–1.00)
GFR, Estimated: >60 mL/min (ref >60)
Glucose, Bld: 152 mg/dL — ABNORMAL HIGH (ref 70–99)
Potassium: 5 mmol/L (ref 3.5–5.1)
Sodium: 139 mmol/L (ref 135–145)

## 2023-01-02 LAB — TROPONIN I (HIGH SENSITIVITY)
Troponin I (High Sensitivity): 12 ng/L (ref <18)
Troponin I (High Sensitivity): 13 ng/L (ref <18)

## 2023-01-02 NOTE — ED Provider Notes (Signed)
[place] EMERGENCY DEPARTMENT AT [facility] Provider Note   HPI: [name] is a 67 year old female with a past medical history as below presenting today with numbness and tingling in her legs.  She feels this bilaterally.  She reports this has been ongoing for 2 to 3 weeks.  She reports feeling some loss of sensation in her hands as well.  She denies any recent gastrointestinal illnesses.  She does endorse having a URI several weeks ago.  She denies a personal history of diabetes but does endorse a family history of diabetes.  She has not had chest pain but does endorse shortness of breath for which she has chronically.  Her husband reports he feels as though this has acutely worsened over the last couple of weeks.  He reports today with exertion she did look short of breath.  The patient reports she typically does not require assistance with ambulation and has started ambulating with a walker.  She has not noticed any room spinning sensation or lightheadedness with standing.  She went to her PCP for evaluation of the numbness and tingling in her extremities he did labs which were unremarkable.  He was concerned this was related to her Effexor.  He recommended evaluation with psychiatry.  Psychiatry saw the patient and did not believe it was related to the medications that she is currently on through them.  Past Medical History:  Diagnosis Date   Anxiety    Daytime somnolence 04/06/2013   DDD (degenerative disc disease), lumbar    Depression    Hypercholesteremia    Hypertension    OSA on CPAP 04/06/2013   Sleep apnea    Urinary incontinence     Past Surgical History:  Procedure Laterality Date   ABLATION  09/29/2017   BACK SURGERY  04/06/2019   BREAST EXCISIONAL BIOPSY Left    CATARACT EXTRACTION Bilateral    ELECTROPHYSIOLOGIC STUDY N/A 01/01/2016   Procedure: SVT Ablation;  Surgeon: Marinus Maw, MD;  Location: [facility] INVASIVE CV LAB;  Service: Cardiovascular;   Laterality: N/A;   HAND SURGERY Right    KNEE ARTHROPLASTY Left    NASAL SEPTUM SURGERY     RADICAL HYSTERECTOMY  2000   RIGHT HEART CATH N/A 05/22/2021   Procedure: RIGHT HEART CATH;  Surgeon: [place], Peter M, MD;  Location: [facility] INVASIVE CV LAB;  Service: Cardiovascular;  Laterality: N/A;   TONSILLECTOMY AND ADENOIDECTOMY       Social History   Tobacco Use   Smoking status: Never   Smokeless tobacco: Never  Vaping Use   Vaping Use: Never used  Substance Use Topics   Alcohol use: Not Currently    Alcohol/week: 0.0 standard drinks of alcohol   Drug use: No      Review of Systems  A complete ROS was performed with pertinent positives/negatives noted in the HPI.   Vitals:   01/02/23 1150 01/02/23 1315  BP: (!) 148/91 (!) 162/78  Pulse:  91  Resp:  17  Temp:  98.4 F (36.9 C)  SpO2:  99%    Physical Exam Vitals and nursing note reviewed.  Constitutional:      General: She is not in acute distress.    Appearance: She is well-developed.  HENT:     Head: Normocephalic and atraumatic.  Cardiovascular:     Rate and Rhythm: Normal rate and regular rhythm.     Pulses: Normal pulses.     Heart sounds: Normal heart sounds. No murmur  heard.    No friction rub. No gallop.  Pulmonary:     Effort: Pulmonary effort is normal. No respiratory distress.     Breath sounds: Normal breath sounds. No stridor. No wheezing, rhonchi or rales.  Abdominal:     General: Abdomen is flat. There is no distension.     Palpations: Abdomen is soft.     Tenderness: There is no abdominal tenderness. There is no guarding or rebound.  Musculoskeletal:        General: No swelling.  Skin:    General: Skin is warm and dry.     Capillary Refill: Capillary refill takes less than 2 seconds.  Neurological:     Mental Status: She is alert.     Sensory: Sensory deficit (decreases sensation over bylateral feet.  Normal sensation in all distributions of the face bilaterally, upper extremities, and lower  extremities from the upper thighs to her ankles) present.     Motor: Motor function is intact. No weakness, tremor, atrophy, abnormal muscle tone, seizure activity or pronator drift.     Coordination: Coordination is intact. Coordination normal. Finger-Nose-Finger Test normal.     Gait: Gait is intact. Gait normal.     Comments: CRANIAL NERVES: 2 (Optic) - Visual fields intact. PERRL brisk.  3/4/6 (Oculomotor, Trochlear, Abducens) - EOM full without nystagmus 5 (Trigeminal) - sensation intact to light touch 7 (Facial) - No facial weakness or asymmetry.  8 (Vestibulococchlear) - responds to voice, auditory acuity grossly normal 9/10 (Glossopharyngeal) - symmetric palate and uvula elevation 11 (Spinal accessory) - head midline, Normal and symmetric sternocleidomastoid and trapezius strength  12 (Hypoglossal) - tongue midline   Psychiatric:        Mood and Affect: Mood normal.     Procedures  MDM:  Imaging/radiology results:  DG Chest 1 View  Result Date: 01/02/2023 CLINICAL DATA:  Shortness of breath. Numbness and tingling to bilateral feet and hands. EXAM: CHEST  1 VIEW COMPARISON:  11/28/2022. FINDINGS: Heart is borderline enlarged and the mediastinal contour is within normal limits. A moderate hiatal hernia is present. Mild atelectasis is present at the left lung base. No consolidation, effusion, or pneumothorax. Scoliosis and degenerative changes are present in the thoracic spine. IMPRESSION: 1. Mild atelectasis at the left lung base. 2. Borderline cardiomegaly. 3. Moderate hiatal hernia. Electronically Signed   By: Thornell Sartorius M.D.   On: 01/02/2023 20:17    EKG results: ECG on my interpretation is normal sinus rhythm and rate, without anatomical ischemia representing STEMI, New-onset Arrhythmia, or ischemic equivalent.   Lab results:  Labs Reviewed  CBC - Abnormal; Notable for the following components:      Result Value   RBC 5.63 (*)    Hemoglobin 15.3 (*)    HCT 49.5 (*)     All other components within normal limits  BASIC METABOLIC PANEL - Abnormal; Notable for the following components:   Glucose, Bld 152 (*)    All other components within normal limits  BRAIN NATRIURETIC PEPTIDE  TROPONIN I (HIGH SENSITIVITY)  TROPONIN I (HIGH SENSITIVITY)    Medical decision making: -Vital signs stable. Patient afebrile, hemodynamically stable, and non-toxic appearing. -Patient's presentation is most consistent with acute complicated illness / injury requiring diagnostic workup.. [name] is a 67 y.o. female presenting to the emergency department with extremity numbness.  -Additional history obtained from husband at bedside. -Patient is endorsing shortness of breath.  Initial troponin 12. Second troponin 13. Delta troponin 1. EKG I obtained  reveals no anatomical ischemia representing STEMI, new onset arrythmia, or ischemic equivalent. Therefore do not suspect ACS at this time. CXR unremarkable for focal airspace disease, patient is afebrile, no cough, do not suspect Pneumonia. CXR without evidence of Pneumothorax. No concerns for Pericardial Tamponade on EKG and in light of patients hemodynamic stability doubt this pathology. No pain related to supine or prone positions and given EKG doubt Pericarditis. Unlikely myocarditis, does not fit clinical picture, no EKG findings to support. Unlikely Pulmonary Embolism as well's score, low probability. Therefore will not obtain CTA Chest or D-dimer. No lower extremity edema, CXR without pulmonary edema, BNP normal, doubt CHF exacerbation.  Patient is also endorsing decree sensation primarily in her feet.  Neurologic exam is overall nonfocal do not suspect CVA.  Patient does not have any recent gastrointestinal illnesses, no decreased tone, no decrease strength, ambulatory in the emergency department, do not suspect neuromuscular disease.  Patient's overall presentation more consistent with peripheral neuropathy will provide phone number  for neurology as patient is requesting specialist follow-up. -I have discussed the results, diagnosis, treatment plan, follow up recommendations, and return precautions with the patient. They verbalized understanding.  No further questions at the time of discharge.    Medical Decision Making Amount and/or Complexity of Data Reviewed Labs: ordered. Decision-making details documented in ED Course. Radiology: ordered and independent interpretation performed. Decision-making details documented in ED Course. ECG/medicine tests: ordered and independent interpretation performed. Decision-making details documented in ED Course.     The plan for this patient was discussed with Dr. Wilkie Aye, who voiced agreement and who oversaw evaluation and treatment of this patient.  Marta Lamas, MD Emergency Medicine, PGY-3  Note: Dragon medical dictation software was used in the creation of this note.   Clinical Impression:  1. Numbness   2. Shortness of breath         Chase Caller, MD 01/03/23 0012    Rozelle Logan, DO 01/06/23 1928

## 2023-01-02 NOTE — Discharge Instructions (Addendum)
[name]:  Thank you for allowing [name] to take care of you today.  We hope you begin feeling better soon.  To-Do: Please follow-up with neurology.   Please return to the Emergency Department or call 911 if you experience chest pain, shortness of breath, severe pain, severe fever, altered mental status, or have any reason to think that you need emergency medical care.  Thank you again.  Hope you feel better soon.  Department of Emergency Medicine [facility]

## 2023-01-02 NOTE — ED Notes (Signed)
Discharge instructions provided by edp were discussed with pt and s/o at bedside. Pt was able to verbalize understanding with no additional questions at this time. Pt ambulatory to go home with s/o at bedside.

## 2023-01-02 NOTE — ED Triage Notes (Addendum)
Pt c/o numbness and tingling in bilateral feet and hands since May 20; seen by PCP for same; recently modified Effexor dose; endorses difficulty ambulating, performing ADLs; states numbness now going up both arms; no unilateral weakness, no bowel or bladder incontinence

## 2023-01-03 ENCOUNTER — Encounter: Payer: Self-pay | Admitting: Pulmonary Disease

## 2023-01-04 ENCOUNTER — Emergency Department (HOSPITAL_BASED_OUTPATIENT_CLINIC_OR_DEPARTMENT_OTHER): Payer: Medicare Other

## 2023-01-04 ENCOUNTER — Emergency Department (HOSPITAL_BASED_OUTPATIENT_CLINIC_OR_DEPARTMENT_OTHER)
Admission: EM | Admit: 2023-01-04 | Discharge: 2023-01-04 | Disposition: A | Discharge status: Home / Self Care | Payer: Medicare Other | Attending: Emergency Medicine | Admitting: Emergency Medicine

## 2023-01-04 ENCOUNTER — Other Ambulatory Visit: Payer: Self-pay

## 2023-01-04 ENCOUNTER — Encounter (HOSPITAL_BASED_OUTPATIENT_CLINIC_OR_DEPARTMENT_OTHER): Payer: Self-pay

## 2023-01-04 DIAGNOSIS — G609 Hereditary and idiopathic neuropathy, unspecified: Secondary | ICD-10-CM | POA: Diagnosis not present

## 2023-01-04 DIAGNOSIS — Z7982 Long term (current) use of aspirin: Secondary | ICD-10-CM | POA: Insufficient documentation

## 2023-01-04 DIAGNOSIS — R200 Anesthesia of skin: Secondary | ICD-10-CM | POA: Diagnosis present

## 2023-01-04 LAB — URINALYSIS, ROUTINE W REFLEX MICROSCOPIC
Bilirubin Urine: NEGATIVE
Glucose, UA: NEGATIVE mg/dL
Hgb urine dipstick: NEGATIVE
Ketones, ur: NEGATIVE mg/dL
Leukocytes,Ua: NEGATIVE
Nitrite: NEGATIVE
Protein, ur: NEGATIVE mg/dL
Specific Gravity, Urine: 1.02 (ref 1.005–1.030)
pH: 5.5 (ref 5.0–8.0)

## 2023-01-04 LAB — CBG MONITORING, ED: Glucose-Capillary: 103 mg/dL — ABNORMAL HIGH (ref 70–99)

## 2023-01-04 MED ORDER — GABAPENTIN 100 MG PO CAPS: 100.0000 mg | ORAL_CAPSULE | Freq: Three times a day (TID) | ORAL | 0 refills | Status: DC

## 2023-01-04 NOTE — ED Provider Notes (Signed)
[place] [facility] Provider Note   CSN: [id number] Arrival date & time: 01/04/23  0841     History  Chief Complaint  Patient presents with   Numbness    [name] is a 67 y.o. female.  Patient represents to the ED today for numbness of the lower extremities and hands. She was seen in the ED on 6/6, had work-up for associated SOB. Her work-up including EKG, CXR, troponins, BNP were unremarkable. She does follow with neurology 2/2 CPAP usage. Today she presents because of some progression of her numbness.  She states that it has moved more proximally up her shins and has now extended from her bilateral hands to her bilateral wrist and forearm area.  She does report some associated weakness, for instance she has been unable to grasp her ukulele or use tweezers well because of weakness and sensation deficit.  She has had a mild generalized headache.  She also has some generalized tightness in her bilateral neck, no midline pain or decreased range of motion.  Patient denies warning symptoms of back pain including: fecal incontinence, urinary retention or overflow incontinence, night sweats, waking from sleep with back pain, unexplained fevers or weight loss, h/o cancer, IVDU, recent trauma.  She does feel as though her bowels have been "sluggish" but she denies fecal incontinence or saddle paresthesias.  She does have anxiety about urination and is afraid to be incontinent because she cannot get up and get to the restroom as quickly.  Patient has seen her PCP for this and has had per her report an extensive medical workup including B12.  I am unable to see these results in epic.        Home Medications Prior to Admission medications   Medication Sig Start Date End Date Taking? Authorizing Provider  allopurinol (ZYLOPRIM) 100 MG tablet Take 100 mg by mouth daily. 09/09/22   [provider]  aspirin EC 81 MG tablet Take 1 tablet (81 mg total)  by mouth daily. SWALLOW WHOLE. 04/23/22   Jake Bathe, MD  cetirizine (ZYRTEC) 10 MG tablet Take 10 mg by mouth in the morning.    [provider]  Cholecalciferol (VITAMIN D3) 50 MCG (2000 UT) TABS Take 2,000 Units by mouth in the morning.    [provider]  Colchicine (COLCRYS PO) Take 0.6 mg by mouth. Was twice daily, now once daily, then taper    [provider]  ezetimibe (ZETIA) 10 MG tablet TAKE 1 TABLET BY MOUTH EVERY DAY 12/13/22   Jake Bathe, MD  fluticasone (FLONASE) 50 MCG/ACT nasal spray Place 2 sprays into both nostrils daily as needed for allergies.    [provider]  pravastatin (PRAVACHOL) 40 MG tablet TAKE 1 TABLET BY MOUTH EVERY DAY IN THE EVENING 07/25/22   Jake Bathe, MD  spironolactone (ALDACTONE) 50 MG tablet TAKE 1 TABLET BY MOUTH EVERY DAY 07/25/22   Jake Bathe, MD  venlafaxine XR (EFFEXOR-XR) 75 MG 24 hr capsule Take 75 mg by mouth daily. 12/04/22   [provider]      Allergies    Other and Rosuvastatin    Review of Systems   Review of Systems  Physical Exam Updated Vital Signs BP (!) 173/101 (BP Location: Right Arm)   Pulse 79   Temp 97.8 F (36.6 C) (Oral)   Resp 18   Ht 5\' 5"  (1.651 m)   Wt 89.8 kg   SpO2 98%  BMI 32.95 kg/m   Physical Exam Vitals and nursing note reviewed.  Constitutional:      Appearance: She is well-developed.  HENT:     Head: Normocephalic and atraumatic.     Right Ear: External ear normal.     Left Ear: External ear normal.     Nose: Nose normal.     Mouth/Throat:     Pharynx: Uvula midline.  Eyes:     General: Lids are normal.     Extraocular Movements:     Right eye: No nystagmus.     Left eye: No nystagmus.     Conjunctiva/sclera: Conjunctivae normal.     Pupils: Pupils are equal, round, and reactive to light.  Cardiovascular:     Rate and Rhythm: Normal rate and regular rhythm.  Pulmonary:     Effort: Pulmonary effort is normal.     Breath sounds:  Normal breath sounds.  Abdominal:     Palpations: Abdomen is soft.     Tenderness: There is no abdominal tenderness.  Musculoskeletal:     Cervical back: Normal range of motion and neck supple. No tenderness or bony tenderness.  Skin:    General: Skin is warm and dry.  Neurological:     Mental Status: She is alert and oriented to person, place, and time.     GCS: GCS eye subscore is 4. GCS verbal subscore is 5. GCS motor subscore is 6.     Cranial Nerves: No cranial nerve deficit.     Sensory: Sensory deficit present.     Motor: No weakness.     Coordination: Coordination normal.     Gait: Gait abnormal (Ambulating with walker).     Comments: Upper extremity myotomes tested bilaterally:  C5 Shoulder abduction 5/5 C6 Elbow flexion/wrist extension 5/5 C7 Elbow extension 5/5 C8 Finger flexion 5/5 T1 Finger abduction 5/5  Lower extremity myotomes tested bilaterally: L2 Hip flexion 5/5 L3 Knee extension 5/5 L4 Ankle dorsiflexion 5/5 S1 Ankle plantar flexion 5/5  Sensation to sharp is intact, patient jumps when I touch in the dermatomes of the ankle and the feet bilaterally. States that she feels pins.   Normal sensation on the face bilaterally.     ED Results / Procedures / Treatments   Labs (all labs ordered are listed, but only abnormal results are displayed) Labs Reviewed  CBG MONITORING, ED - Abnormal; Notable for the following components:      Result Value   Glucose-Capillary 103 (*)    All other components within normal limits  URINALYSIS, ROUTINE W REFLEX MICROSCOPIC    EKG None  Radiology DG Chest 1 View  Result Date: 01/02/2023 CLINICAL DATA:  Shortness of breath. Numbness and tingling to bilateral feet and hands. EXAM: CHEST  1 VIEW COMPARISON:  11/28/2022. FINDINGS: Heart is borderline enlarged and the mediastinal contour is within normal limits. A moderate hiatal hernia is present. Mild atelectasis is present at the left lung base. No consolidation, effusion,  or pneumothorax. Scoliosis and degenerative changes are present in the thoracic spine. IMPRESSION: 1. Mild atelectasis at the left lung base. 2. Borderline cardiomegaly. 3. Moderate hiatal hernia. Electronically Signed   By: Thornell Sartorius M.D.   On: 01/02/2023 20:17    Procedures Procedures    Medications Ordered in ED Medications - No data to display  ED Course/ Medical Decision Making/ A&P    Patient seen and examined. History obtained directly from patient. I also reviewed outpatient neurology notes as well as most recent  ED visit.  Labs/EKG: Ordered UA.  Imaging: Ordered CT head without contrast.  Medications/Fluids: None ordered  Most recent vital signs reviewed and are as follows: BP (!) 173/101 (BP Location: Right Arm)   Pulse 79   Temp 97.8 F (36.6 C) (Oral)   Resp 18   Ht 5\' 5"  (1.651 m)   Wt 89.8 kg   SpO2 98%   BMI 32.95 kg/m   Initial impression: Progressive peripheral neuropathy of unclear etiology.  She will need close neurology follow-up for work-up of her neuropathy. Will trial gabapentin in the interim. I will also request neurology outpatient follow-up through [facility] with her neuro team at [facility].   11:58 AM Reassessment performed. Patient appears stable.  Discussed findings and plan with patient at bedside and with patient's daughter by telephone.  Patient's daughter is in neurorehab and we discussed possible differentials and risks and benefits of further workup.  Labs personally reviewed and interpreted including: UA negative.  Imaging personally visualized and interpreted including: CT of the brain, noncontrast, negative.  Reviewed pertinent lab work and imaging with patient at bedside. Questions answered.   Most current vital signs reviewed and are as follows: BP (!) 160/105   Pulse 77   Temp 97.8 F (36.6 C) (Oral)   Resp 18   Ht 5\' 5"  (1.651 m)   Wt 89.8 kg   SpO2 95%   BMI 32.95 kg/m   Plan: Discharge plan as above with outpatient  follow-up.  I have placed outpatient neurology follow-up on behalf of the patient.  I have sent in prescription for gabapentin 100 mg 3 times daily to begin in the interim.  Patient states that she feels reassured, but does have questions about what to do if her symptoms do worsen.  I encouraged her, if her symptoms get a lot worse and she is unable to care for herself or ambulate, or has any difficulty with breathing, she should go to [facility] if possible due to the availability of neurology onsite.  Otherwise, if she cannot travel, she needs to call EMS or go to the nearest emergency department.  She verbalizes understanding agrees with this plan.  I also encouraged her to follow-up with neurology and PCP within 1 week for continued monitoring and management.                           Medical Decision Making Amount and/or Complexity of Data Reviewed Labs: ordered. Radiology: ordered.  Risk Prescription drug management.   Patient seems to have a subacute progressive peripheral neuropathy involving the hands and the feet.  She is a diabetic however current symptoms are progressing faster than what I would typically expect a diabetic neuropathy to do.  She is not an alcohol drinker.  Recent PCP workup was reportedly negative including B12.  She has minimal weakness on exam, mainly sensory difficulties and these will be best described as paresthesias rather than a complete numbness as she does have sharp sensation intact.  No current breathing problems.  Considered differential such as CVA, spinal pathology such as central cord syndrome due to a bulging or ruptured disc, demyelinating conditions such as transverse myelitis, neuromuscular problem such as Guillain-Barr.  I do not feel that the patient requires transferred for advanced imaging today.  I do not feel that the risk of LP outweighs the benefits at this point.  She does however need close neurology follow-up.  Hopefully she  will get  this through the referral.  She does seem reliable to monitor her symptoms and come in if things continue to progress, especially if they significantly progress.  Current plan as above.  Patient and family comfortable with this at this time.          Final Clinical Impression(s) / ED Diagnoses Final diagnoses:  Idiopathic peripheral neuropathy    Rx / DC Orders ED Discharge Orders          Ordered    Ambulatory referral to Neurology       Comments: An appointment is requested in approximately: 1 week  Mrs. Tracz is established with Dr. Frances Furbish for sleep medicine but will need to be seen for a new problem, progressive bilateral neuropathy in hands and feet occurring over the past 3 weeks.   01/04/23 1107    gabapentin (NEURONTIN) 100 MG capsule  3 times daily        01/04/23 1110              Renne Crigler, [place] 01/04/23 1203    Terrilee Files, MD 01/04/23 1655

## 2023-01-04 NOTE — ED Triage Notes (Signed)
C/o numbness to bilateral hands and feet since May 20th. States numbness is progressing up arms. Mild headache. States having to use walker because she can't feel her feet.   Seen at [facility] for same on 6/6

## 2023-01-04 NOTE — ED Notes (Signed)
Pt requesting to have CBG checked as her levels had been high at previous visits

## 2023-01-04 NOTE — Discharge Instructions (Addendum)
Please read and follow all provided instructions.  Your diagnoses today include:  1. Idiopathic peripheral neuropathy     Tests performed today include: CT scan of your head that did not show any serious problems Urine test was completely clear Vital signs. See below for your results today.   Medications prescribed:  Gabapentin: medication for nerve discomfort, it should not interact with any of your medications  Take any prescribed medications only as directed.  Home care instructions:  Follow any educational materials contained in this packet.  Follow-up instructions: Please follow-up with your PCP and neurologist in the next 1 week.   Return instructions:  SEEK IMMEDIATE MEDICAL ATTENTION IF: There is confusion or drowsiness You have more than one episode of vomiting.  You notice dizziness or unsteadiness which is getting worse, or inability to walk.  You have convulsions or unconsciousness.  You experience severe, persistent headaches not relieved by Tylenol. There are changes in pupil sizes There is clear or bloody discharge from the nose or ears.  You have change in speech, vision, swallowing, or understanding.  You have progressing weakness, numbness, tingling, or change in bowel or bladder control. You have any other emergent concerns.  Your vital signs today were: BP (!) 173/101 (BP Location: Right Arm)   Pulse 79   Temp 97.8 F (36.6 C) (Oral)   Resp 18   Ht 5\' 5"  (1.651 m)   Wt 89.8 kg   SpO2 98%   BMI 32.95 kg/m  If your blood pressure (BP) was elevated above 135/85 this visit, please have this repeated by your doctor within one month. --------------

## 2023-01-06 ENCOUNTER — Telehealth: Payer: Self-pay | Admitting: Neurology

## 2023-01-06 NOTE — Telephone Encounter (Signed)
I called pt and she states that her numbness started 12-16-2022 L ,foot.  Last Tuesday 12/31/2022 was out with friend and lost her balance had to start using walker.  She has been to [facility] [address] 6/6/2024then also to ED 01/04/2023 on [address] for this.  She states that she has no SOB, she has numbness in hands and elbows since Saturday along with numbness tingling in LE.   No change since.  She was told that a referral was made to [name] to evaluate.  This is being addressed, and will call pt to set appt by referrals.  I relayed if things worsen, advancing numbness,. SOB, then she needs to go back to ED.

## 2023-01-06 NOTE — Telephone Encounter (Signed)
Late entry: Patient sent an after-hours message through the call service yesterday evening, regarding numbness in feet and hands.  I was able to connect with the patient on 01/05/2023.  She reports that she has had numbness in her feet and hands.  She has been followed in sleep clinic at [facility] only.  She reported that she went to the emergency room and they could not help her there.  She also reported seeing her PCP last week.  She was advised to circle back to her PCP and request a referral to [facility] if she would like to be seen for numbness in her hands and feet, we will need PCP notes current workup.  She reported that her primary care did not place a referral.  She was advised to call PCP office again this week to discuss a referral for her new neurologic concerns.  She asked what to do if she had sudden worsening of her symptoms.  She was advised to go back to the emergency room if she had sudden exacerbation of her symptoms. I reviewed her chart, she presented to the emergency room on 01/04/2023.  She had blood work, no lumbar puncture.    Please reach out this morning to the patient, please advise her that if she has had sudden onset of numbness and tingling affecting her hands and feet over the past few weeks or days even, she should immediately go back to the emergency room to get evaluated, particularly if she also has shortness of breath.  Of note, she presented to the emergency room on 01/02/2023 with shortness of breath.    I would recommend a lumbar puncture and possible admission if needed.  If the numbness is a chronic problem and has been evolving over months or even years, she can request a referral from her primary care as discussed yesterday.

## 2023-01-07 ENCOUNTER — Encounter: Payer: Self-pay | Admitting: Diagnostic Neuroimaging

## 2023-01-07 ENCOUNTER — Ambulatory Visit (INDEPENDENT_AMBULATORY_CARE_PROVIDER_SITE_OTHER): Payer: Medicare Other | Admitting: Diagnostic Neuroimaging

## 2023-01-07 ENCOUNTER — Ambulatory Visit (HOSPITAL_COMMUNITY): Payer: Medicare Other

## 2023-01-07 VITALS — BP 134/82 | HR 72 | Ht 65.0 in | Wt 195.2 lb

## 2023-01-07 DIAGNOSIS — R200 Anesthesia of skin: Secondary | ICD-10-CM

## 2023-01-07 DIAGNOSIS — R531 Weakness: Secondary | ICD-10-CM

## 2023-01-07 NOTE — Progress Notes (Signed)
[facility]  PATIENT: [name] DOB: [birth year]  REFERRING CLINICIAN: Renne Crigler, PA-C HISTORY FROM: patient  REASON FOR VISIT: new consult   HISTORICAL  CHIEF COMPLAINT:  Chief Complaint  Patient presents with   New Patient (Initial Visit)    Rm 7, here with husband Riley Lam Here for numbness on hands and feet. The numbness is from the elbows, forearms and hands. It has been happening since August 2023. Now its using a walker, does not feel stable without it.     HISTORY OF PRESENT ILLNESS:   67 year old female here for evaluation of rapidly progressive ascending numbness and weakness.  Symptoms started around Dec 16, 2022.  Patient noticed a numbness sensation on her left forefoot and toes.  This progressed to the right foot and up her legs.  Within a week she had numbness and tingling and weakness in her hands up to her elbows.  She went to the emergency room twice for evaluation.  She was diagnosed with neuropathy and was recommended to follow-up with neurology.  Of note patient had preceding upper respiratory infection symptoms Dec 12, 2022.  She also had RSV vaccination earlier in May.  Symptoms have plateaued since 01/04/23.  Using a walker at home.  She feels weakness in her hands, arms, legs.  No breathing issues.  No swallowing issues.  No prior similar symptoms.   REVIEW OF SYSTEMS: Full 14 system review of systems performed and negative with exception of: as per HPI.  ALLERGIES: Allergies  Allergen Reactions   Other Itching    Antiseptic Solution   Rosuvastatin Other (See Comments)    HOME MEDICATIONS: Outpatient Medications Prior to Visit  Medication Sig Dispense Refill   allopurinol (ZYLOPRIM) 100 MG tablet Take 100 mg by mouth daily.     aspirin EC 81 MG tablet Take 1 tablet (81 mg total) by mouth daily. SWALLOW WHOLE. 90 tablet 3   cetirizine (ZYRTEC) 10 MG tablet Take 10 mg by mouth in the morning.     Cholecalciferol  (VITAMIN D3) 50 MCG (2000 UT) TABS Take 2,000 Units by mouth in the morning.     Colchicine (COLCRYS PO) Take 0.6 mg by mouth. Was twice daily, now once daily, then taper     ezetimibe (ZETIA) 10 MG tablet TAKE 1 TABLET BY MOUTH EVERY DAY 90 tablet 3   fluticasone (FLONASE) 50 MCG/ACT nasal spray Place 2 sprays into both nostrils daily as needed for allergies.     gabapentin (NEURONTIN) 100 MG capsule Take 1 capsule (100 mg total) by mouth 3 (three) times daily. 90 capsule 0   pravastatin (PRAVACHOL) 40 MG tablet TAKE 1 TABLET BY MOUTH EVERY DAY IN THE EVENING 90 tablet 3   spironolactone (ALDACTONE) 50 MG tablet TAKE 1 TABLET BY MOUTH EVERY DAY 90 tablet 3   venlafaxine XR (EFFEXOR-XR) 75 MG 24 hr capsule Take 37.5 mg by mouth daily.     No facility-administered medications prior to visit.    PAST MEDICAL HISTORY: Past Medical History:  Diagnosis Date   Anxiety    Daytime somnolence 04/06/2013   DDD (degenerative disc disease), lumbar    Depression    Hypercholesteremia    Hypertension    OSA on CPAP 04/06/2013   Sleep apnea    Urinary incontinence     PAST SURGICAL HISTORY: Past Surgical History:  Procedure Laterality Date   ABLATION  09/29/2017   BACK SURGERY  04/06/2019   BREAST EXCISIONAL BIOPSY Left  CATARACT EXTRACTION Bilateral    ELECTROPHYSIOLOGIC STUDY N/A 01/01/2016   Procedure: SVT Ablation;  Surgeon: Marinus Maw, MD;  [facility] INVASIVE CV LAB;  Service: Cardiovascular;  Laterality: N/A;   HAND SURGERY Right    KNEE ARTHROPLASTY Left    NASAL SEPTUM SURGERY     RADICAL HYSTERECTOMY  2000   RIGHT HEART CATH N/A 05/22/2021   Procedure: RIGHT HEART CATH;  Surgeon: [place], Peter M, MD;  Location: [facility] INVASIVE CV LAB;  Service: Cardiovascular;  Laterality: N/A;   TONSILLECTOMY AND ADENOIDECTOMY      FAMILY HISTORY: Family History  Problem Relation Age of Onset   Hyperlipidemia Mother    Heart disease Father    Hypertension Father    Stroke Father     Hyperlipidemia Father    Diabetes Maternal Grandmother    Sleep apnea Neg Hx     SOCIAL HISTORY: Social History   Socioeconomic History   Marital status: Married    Spouse name: Not on file   Number of children: 2   Years of education: Not on file   Highest education level: Not on file  Occupational History   Not on file  Tobacco Use   Smoking status: Never   Smokeless tobacco: Never  Vaping Use   Vaping Use: Never used  Substance and Sexual Activity   Alcohol use: Not Currently    Alcohol/week: 0.0 standard drinks of alcohol   Drug use: No   Sexual activity: Yes    Partners: Male    Birth control/protection: Surgical    Comment: hysterectomy  Other Topics Concern   Not on file  Social History Narrative   Not on file   Social Determinants of Health   Financial Resource Strain: Not on file  Food Insecurity: Not on file  Transportation Needs: Not on file  Physical Activity: Not on file  Stress: Not on file  Social Connections: Not on file  Intimate Partner Violence: Not on file     PHYSICAL EXAM  GENERAL EXAM/CONSTITUTIONAL: Vitals:  Vitals:   01/07/23 1137  BP: 134/82  Pulse: 72  Weight: 195 lb 3.2 oz (88.5 kg)  Height: 5\' 5"  (1.651 m)   Body mass index is 32.48 kg/m. Wt Readings from Last 3 Encounters:  01/07/23 195 lb 3.2 oz (88.5 kg)  01/04/23 198 lb (89.8 kg)  01/02/23 198 lb (89.8 kg)   Patient is in no distress; well developed, nourished and groomed; neck is supple  CARDIOVASCULAR: Examination of carotid arteries is normal; no carotid bruits Regular rate and rhythm, no murmurs Examination of peripheral vascular system by observation and palpation is normal  EYES: Ophthalmoscopic exam of optic discs and posterior segments is normal; no papilledema or hemorrhages No results found.  MUSCULOSKELETAL: Gait, strength, tone, movements noted in Neurologic exam below  NEUROLOGIC: MENTAL STATUS:      No data to display         awake,  alert, oriented to person, place and time recent and remote memory intact normal attention and concentration language fluent, comprehension intact, naming intact fund of knowledge appropriate  CRANIAL NERVE:  2nd - no papilledema on fundoscopic exam 2nd, 3rd, 4th, 6th - pupils equal and reactive to light, visual fields full to confrontation, extraocular muscles intact, no nystagmus 5th - facial sensation symmetric 7th - facial strength symmetric 8th - hearing intact 9th - palate elevates symmetrically, uvula midline 11th - shoulder shrug symmetric 12th - tongue protrusion midline  MOTOR:  normal bulk and  tone BUE --> DELTOID 4+, TRICEPS 3-4 (LEFT WEAKER THAN RIGHT), FINGER ABDUCTION 3, GRIP 4; CONGENTIAL ABSENT LEFT 5TH DIGIT; RIGHT 5TH DIGIT DEFORMITY BLE --> HF 4, KE 5, KF 4, DF 4  SENSORY:  normal and symmetric to light touch, pinprick, temperature, vibration; EXCEPT HYPERSENSITIVE IN BUE FOREARMS  COORDINATION:  finger-nose-finger, fine finger movements normal  REFLEXES:  deep tendon reflexes BUE TRACE; ABSENT IN BLE  GAIT/STATION:  narrow based gait; USING WALKER     DIAGNOSTIC DATA (LABS, IMAGING, TESTING) - I reviewed patient records, labs, notes, testing and imaging myself where available.  Lab Results  Component Value Date   WBC 8.6 01/02/2023   HGB 15.3 (H) 01/02/2023   HCT 49.5 (H) 01/02/2023   MCV 87.9 01/02/2023   PLT 244 01/02/2023      Component Value Date/Time   NA 139 01/02/2023 1200   NA 138 05/09/2021 1011   K 5.0 01/02/2023 1200   CL 106 01/02/2023 1200   CO2 22 01/02/2023 1200   GLUCOSE 152 (H) 01/02/2023 1200   BUN 21 01/02/2023 1200   BUN 16 05/09/2021 1011   CREATININE 0.97 01/02/2023 1200   CREATININE 0.84 12/27/2015 0833   CALCIUM 9.8 01/02/2023 1200   PROT 7.1 11/14/2021 0912   ALBUMIN 4.4 11/14/2021 0912   AST 21 11/14/2021 0912   ALT 17 11/14/2021 0912   ALKPHOS 50 11/14/2021 0912   BILITOT 0.6 11/14/2021 0912   GFRNONAA  >60 01/02/2023 1200   GFRAA 38 (L) 09/29/2015 1553   Lab Results  Component Value Date   CHOL 130 11/14/2021   HDL 55 11/14/2021   LDLCALC 57 11/14/2021   TRIG 97 11/14/2021   CHOLHDL 2.4 11/14/2021   No results found for: "HGBA1C" No results found for: "VITAMINB12" Lab Results  Component Value Date   TSH 2.070 10/30/2020    B12 519  TSH 0.89  01/04/23 Unremarkable head CT.  No explanation for symptoms.    ASSESSMENT AND PLAN  67 y.o. 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  Symptoms have plateaued.  She is approximately 4 weeks into her symptoms.  Will expedite workup with EMG and lumbar puncture.  Low threshold to return to emergency room for admission if symptoms worsen.   Dx:  1. Weakness   2. Numbness     PLAN:  ACUTE NUMBNESS WEAKNESS (? GBS / AIDP; onset ~12/16/22; preceding URI ~12/12/22; RSV vaccine ~beginning May 2024) - check EMG/NCS (tomorrow; will work-in) and lumbar puncture (this week) - then plan for IVIG if GBS is confirmed  - fall precautions reviewed; use walker   Orders Placed This Encounter  Procedures   DG FL GUIDED LUMBAR PUNCTURE   NCV with EMG(electromyography)   Return for for NCV/EMG.  I reviewed images, labs, notes, records myself. I summarized findings and reviewed with patient, for this high risk condition (rapid ascending neuropathy; possible Guillain-Barr syndrome) requiring high complexity decision making.    Suanne Marker, MD 01/07/2023, 12:44 PM Certified in Neurology, Neurophysiology and Neuroimaging  [facility] [address] [place], [place] [address] [phone]

## 2023-01-07 NOTE — Patient Instructions (Signed)
ACUTE NUMBNESS WEAKNESS (? GBS / AIDP; onset ~12/16/22; preceding URI ~12/12/22; RSV vaccine ~beginning May 2024) - check EMG/NCS and lumbar puncture - then plan for IVIG if GBS is confirmed

## 2023-01-07 NOTE — Telephone Encounter (Signed)
Patient sent a message about her recent chest xray 01/02/23.  Message from patient-  Chest XRay mentioned Mild atelectasis is present at the left lung base.  Is this of concern?   Message routed to Dr. Tonia Brooms to advise

## 2023-01-08 ENCOUNTER — Telehealth: Payer: Self-pay | Admitting: Neurology

## 2023-01-08 ENCOUNTER — Ambulatory Visit (INDEPENDENT_AMBULATORY_CARE_PROVIDER_SITE_OTHER): Payer: Self-pay | Admitting: Diagnostic Neuroimaging

## 2023-01-08 ENCOUNTER — Ambulatory Visit (INDEPENDENT_AMBULATORY_CARE_PROVIDER_SITE_OTHER): Payer: Medicare Other | Admitting: Diagnostic Neuroimaging

## 2023-01-08 VITALS — BP 152/91 | HR 86 | Ht 65.0 in | Wt 195.0 lb

## 2023-01-08 DIAGNOSIS — R531 Weakness: Secondary | ICD-10-CM | POA: Diagnosis not present

## 2023-01-08 DIAGNOSIS — R200 Anesthesia of skin: Secondary | ICD-10-CM

## 2023-01-08 DIAGNOSIS — Z0289 Encounter for other administrative examinations: Secondary | ICD-10-CM

## 2023-01-08 NOTE — Procedures (Signed)
[facility]  NCS (NERVE CONDUCTION STUDY) WITH EMG (ELECTROMYOGRAPHY) REPORT   STUDY DATE: 01/08/23 PATIENT NAME: [name] DOB: [birth year] MRN: [id number]  ORDERING CLINICIAN: 01/08/23  TECHNOLOGIST: Jenelle Mages ELECTROMYOGRAPHER: Glenford Bayley. Judia Arnott, MD  CLINICAL INFORMATION: 67 year old female with ascending numbness and weakness over past 4 weeks.  FINDINGS: NERVE CONDUCTION STUDY:  Right median and right ulnar motor responses are normal.  Right tibial motor response is normal.  Right peroneal motor response has prolonged distal latency (9.0 ms), decreased amplitude and slow conduction velocity (38 m/s) with stimulation at the fibular head.  Right ulnar and right tibial F wave latencies are normal.  Right median, right ulnar, right sural and right superficial peroneal sensory responses could not be obtained.  Right radial sensory response has normal peak latency and decreased amplitude.    NEEDLE ELECTROMYOGRAPHY:  Needle examination of right upper and lower extremities demonstrates rare fasciculations in right deltoid, positive sharp waves in right tibialis anterior and fibrillation potentials and positive sharp waves in right gastrocnemius muscles at rest.  Decreased motor unit recruitment noted in right biceps, right flexor carpi radialis, right vastus medialis, right tibialis anterior, right gastrocnemius muscles on exertion.  Right cervical paraspinal muscles are unremarkable.  History of lumbar spine surgery and therefore lumbar paraspinal muscle evaluation was deferred.   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  Demyelinating features noted in right peroneal motor response.   INTERPRETING PHYSICIAN:  Suanne Marker, MD Certified in Neurology, Neurophysiology and Neuroimaging  [facility] [address], [address] [place], [place]  [address] [phone]   [facility]    Nerve / Sites Muscle Latency Ref. Amplitude Ref. Rel Amp Segments Distance Velocity Ref. Area    ms ms mV mV %  cm m/s m/s mVms  R Median - APB     Wrist APB 4.3 ?4.4 7.3 ?4.0 100 Wrist - APB 7   27.2     Upper arm APB 9.2  6.6  90.2 Upper arm - Wrist 25 50 ?49 22.9  R Ulnar - ADM     Wrist ADM 2.8 ?3.3 8.5 ?6.0 100 Wrist - ADM 7   30.3     B.Elbow ADM 5.0  7.9  92.5 B.Elbow - Wrist 11 51 ?49 31.6     A.Elbow ADM 7.8  7.0  89.3 A.Elbow - B.Elbow 15 53 ?49 30.2  R Peroneal - EDB     Ankle EDB 9.0 ?6.5 1.7 ?2.0 100 Ankle - EDB 9   6.0     Fib head EDB 17.0  0.9  54.2 Fib head - Ankle 24 30 ?44 3.1     Pop fossa EDB 18.3  1.3  140 Pop fossa - Fib head 11 84 ?44 5.1         Pop fossa - Ankle      R Tibial - AH     Ankle AH 5.3 ?5.8 5.9 ?4.0 100 Ankle - AH 9   21.7     Pop fossa AH 14.2  6.6  112 Pop fossa - Ankle 35 39 ?41 27.1             SNC    Nerve / Sites Rec. Site Peak Lat Ref.  Amp Ref. Segments Distance    ms ms V V  cm  R Radial - Anatomical snuff box (Forearm)     Forearm Wrist 2.7 ?2.9 4 ?15 Forearm -  Wrist 10  R Sural - Ankle (Calf)     Calf Ankle NR ?4.4 NR ?6 Calf - Ankle 14  R Superficial peroneal - Ankle     Lat leg Ankle NR ?4.4 NR ?6 Lat leg - Ankle 14  R Median - Orthodromic (Dig II, Mid palm)     Dig II Wrist NR ?3.4 NR ?10 Dig II - Wrist 13  R Ulnar - Orthodromic, (Dig V, Mid palm)     Dig V Wrist NR ?3.1 NR ?5 Dig V - Wrist 60               F  Wave    Nerve F Lat Ref.   ms ms  R Ulnar - ADM 31.0 ?32.0  R Tibial - AH 59.0 ?56.0         EMG Summary Table    Spontaneous MUAP Recruitment  Muscle IA Fib PSW Fasc Other Amp Dur. Poly Pattern  R. Vastus medialis Normal None None None _______ Increased Normal Normal Reduced  R. Tibialis anterior Normal None 1+ None _______ Normal Normal Normal Reduced  R. Gastrocnemius (Medial head) Normal 2+ 2+ None _______ Normal Normal 1+ Reduced  R. Deltoid Normal None None Rare _______  Normal Normal Normal Normal  R. Biceps brachii Normal None None None _______ Increased Normal Normal Reduced  R. Triceps brachii Normal None None None _______ Normal Normal Normal Normal  R. Flexor carpi radialis Normal None None None _______ Increased Normal Normal Reduced  R. First dorsal interosseous Normal None None None _______ Normal Normal Normal Normal  R. Cervical paraspinals Normal None None None _______ Normal Normal Normal Normal

## 2023-01-08 NOTE — Telephone Encounter (Signed)
Received a notification from Dr Marjory Lies that patient completed NCV/EMG testing and he would like to have the patient complete a LP and possibly set up for home health IVIG. We found out that the LP was not able to be scheduled until 6/19 so Dr Marjory Lies wanted to move forward with getting the authorization for IVIG. I have called and spoke with the pharmacy director with [facility], who offers home health, and she didn't seem confident that insurance auth could be processed to have it approved but advised they would attempt the process and have someone keep [name] updated.   "lets see if IVIG approved. we may find out tomorrow. pls let patient know that we will await ivig approval / denial, and then decide about plan tomorrow. if symptoms significantly worsen, then patient may go to ER for admission, but we will try to help pt avoid that."

## 2023-01-09 ENCOUNTER — Other Ambulatory Visit: Payer: Medicare Other

## 2023-01-09 NOTE — Telephone Encounter (Signed)
Received incoming call from Rema Jasmine, she stated she needed updated lab list and info regarding history of Guillain Barre. She stated they are still working on getting approval. Her direct fax number is [phone]

## 2023-01-10 ENCOUNTER — Encounter: Payer: Self-pay | Admitting: Diagnostic Neuroimaging

## 2023-01-12 LAB — IMMUNOGLOBULINS A/E/G/M, SERUM
IgA/Immunoglobulin A, Serum: 429 mg/dL — ABNORMAL HIGH (ref 87–352)
IgE (Immunoglobulin E), Serum: 39 IU/mL (ref 6–495)
IgG (Immunoglobin G), Serum: 1024 mg/dL (ref 586–1602)
IgM (Immunoglobulin M), Srm: 35 mg/dL (ref 26–217)

## 2023-01-14 ENCOUNTER — Ambulatory Visit: Payer: 59 | Admitting: Neurology

## 2023-01-14 NOTE — Discharge Instructions (Signed)

## 2023-01-15 ENCOUNTER — Ambulatory Visit
Admission: RE | Admit: 2023-01-15 | Discharge: 2023-01-15 | Disposition: A | Discharge status: Home / Self Care | Payer: Medicare Other | Source: Ambulatory Visit | Attending: Diagnostic Neuroimaging | Admitting: Diagnostic Neuroimaging

## 2023-01-15 VITALS — BP 130/66 | HR 91

## 2023-01-15 DIAGNOSIS — R531 Weakness: Secondary | ICD-10-CM

## 2023-01-15 DIAGNOSIS — R200 Anesthesia of skin: Secondary | ICD-10-CM

## 2023-01-15 LAB — CSF CELL COUNT WITH DIFFERENTIAL

## 2023-01-15 NOTE — Progress Notes (Signed)
1 vial of blood drawn from pts LAC to be sent off with LP lab work. 1 successful attempt, pt tolerated well. Gauze and tape applied after. 

## 2023-01-16 ENCOUNTER — Telehealth: Payer: Self-pay | Admitting: Diagnostic Neuroimaging

## 2023-01-16 DIAGNOSIS — G610 Guillain-Barre syndrome: Secondary | ICD-10-CM | POA: Insufficient documentation

## 2023-01-16 LAB — CSF CULTURE W GRAM STAIN: SPECIMEN QUALITY:: ADEQUATE

## 2023-01-16 NOTE — Telephone Encounter (Signed)
I called patient; symptoms have slightly improved. Completed 4 days of IVIG. CSF protein is elevated. 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 Monitor symptoms and continue exercises. Will setup PT, OT for next week.   1. Guillain Barr syndrome [facility])    Orders Placed This Encounter  Procedures   Ambulatory referral to Physical Therapy   Ambulatory referral to Occupational Therapy   Suanne Marker, MD 01/16/2023, 7:02 PM Certified in Neurology, Neurophysiology and Neuroimaging  [facility] [address] [place], [place] [address] [phone]

## 2023-01-17 ENCOUNTER — Encounter: Payer: Self-pay | Admitting: Diagnostic Neuroimaging

## 2023-01-19 LAB — CSF CELL COUNT WITH DIFFERENTIAL: TOTAL NUCLEATED CELL: 2 cells/uL (ref 0–5)

## 2023-01-20 ENCOUNTER — Telehealth: Payer: Self-pay | Admitting: Diagnostic Neuroimaging

## 2023-01-20 LAB — PROTEIN, CSF: Total Protein, CSF: 96 mg/dL — ABNORMAL HIGH (ref 15–60)

## 2023-01-20 LAB — OLIGOCLONAL BANDS, CSF + SERM: Oligo Bands: ABSENT

## 2023-01-20 LAB — GLUCOSE, CSF: Glucose, CSF: 62 mg/dL (ref 40–80)

## 2023-01-20 LAB — CSF CULTURE W GRAM STAIN
MICRO NUMBER:: 15101919
Result:: NO GROWTH

## 2023-01-20 LAB — CSF CELL COUNT WITH DIFFERENTIAL: RBC Count, CSF: 0 cells/uL

## 2023-01-20 NOTE — Telephone Encounter (Signed)
Referral faxed to [facility] at [facility]: Phone [phone]   Fax: [phone]

## 2023-01-21 ENCOUNTER — Telehealth: Payer: Self-pay | Admitting: Neurology

## 2023-01-21 NOTE — Telephone Encounter (Signed)
Received results from the LP results for the patient from completing the LP.

## 2023-01-29 ENCOUNTER — Encounter: Payer: Self-pay | Admitting: Diagnostic Neuroimaging

## 2023-01-29 ENCOUNTER — Ambulatory Visit (HOSPITAL_COMMUNITY): Payer: Medicare Other | Attending: Cardiology

## 2023-01-29 DIAGNOSIS — R0609 Other forms of dyspnea: Secondary | ICD-10-CM | POA: Insufficient documentation

## 2023-01-29 LAB — ECHOCARDIOGRAM COMPLETE
Area-P 1/2: 2.76 cm2
S' Lateral: 3 cm

## 2023-01-29 MED ORDER — GABAPENTIN 100 MG PO CAPS: 100.0000 mg | ORAL_CAPSULE | Freq: Three times a day (TID) | ORAL | 1 refills | Status: DC

## 2023-01-29 NOTE — Addendum Note (Signed)
Addended by: Berna Spare A on: 01/29/2023 11:37 AM   Modules accepted: Orders

## 2023-01-29 NOTE — Telephone Encounter (Signed)
Yes symptoms can be expected this way. May increase gabapentin dosing for numbness / tingling if uncomfortable. Gabapentin range up to 100-300mg  twice a day or three times a day. Monitor for next 2-4 weeks.   Suanne Marker, MD 01/29/2023, 11:05 AM Certified in Neurology, Neurophysiology and Neuroimaging  [facility] [address], [address] [place], [place] [address] [phone]

## 2023-02-03 ENCOUNTER — Encounter: Payer: Self-pay | Admitting: Physical Therapy

## 2023-02-03 ENCOUNTER — Encounter: Payer: Self-pay | Admitting: Occupational Therapy

## 2023-02-03 ENCOUNTER — Ambulatory Visit: Payer: Medicare Other | Admitting: Physical Therapy

## 2023-02-03 ENCOUNTER — Ambulatory Visit: Payer: Medicare Other | Attending: Diagnostic Neuroimaging | Admitting: Occupational Therapy

## 2023-02-03 ENCOUNTER — Other Ambulatory Visit: Payer: Self-pay

## 2023-02-03 VITALS — BP 148/74 | HR 64

## 2023-02-03 DIAGNOSIS — R2681 Unsteadiness on feet: Secondary | ICD-10-CM

## 2023-02-03 DIAGNOSIS — R278 Other lack of coordination: Secondary | ICD-10-CM | POA: Insufficient documentation

## 2023-02-03 DIAGNOSIS — M6281 Muscle weakness (generalized): Secondary | ICD-10-CM | POA: Insufficient documentation

## 2023-02-03 DIAGNOSIS — R29818 Other symptoms and signs involving the nervous system: Secondary | ICD-10-CM | POA: Insufficient documentation

## 2023-02-03 DIAGNOSIS — G610 Guillain-Barre syndrome: Secondary | ICD-10-CM | POA: Insufficient documentation

## 2023-02-03 DIAGNOSIS — R208 Other disturbances of skin sensation: Secondary | ICD-10-CM | POA: Diagnosis present

## 2023-02-03 DIAGNOSIS — R2689 Other abnormalities of gait and mobility: Secondary | ICD-10-CM | POA: Insufficient documentation

## 2023-02-03 NOTE — Therapy (Signed)
OUTPATIENT PHYSICAL THERAPY NEURO EVALUATION   Patient Name: [name] MRN: [id number] DOB:19-Mar-1956, 67 y.o., female Today's Date: 02/03/2023   PCP: Suanne Marker, MD REFERRING PROVIDER: Suanne Marker, MD  END OF SESSION:  PT End of Session - 02/03/23 1322     Visit Number 1    Number of Visits 9    Date for PT Re-Evaluation 04/14/23    Authorization Type Medicare    PT Start Time 1323    PT Stop Time 1416    PT Time Calculation (min) 53 min    Equipment Utilized During Treatment Gait belt    Activity Tolerance Patient tolerated treatment well    Behavior During Therapy WFL for tasks assessed/performed             Past Medical History:  Diagnosis Date   Anxiety    Daytime somnolence 04/06/2013   DDD (degenerative disc disease), lumbar    Depression    Hypercholesteremia    Hypertension    OSA on CPAP 04/06/2013   Sleep apnea    Urinary incontinence    Past Surgical History:  Procedure Laterality Date   ABLATION  09/29/2017   BACK SURGERY  04/06/2019   BREAST EXCISIONAL BIOPSY Left    CATARACT EXTRACTION Bilateral    ELECTROPHYSIOLOGIC STUDY N/A 01/01/2016   Procedure: SVT Ablation;  Surgeon: Marinus Maw, MD;  Location: [facility] INVASIVE CV LAB;  Service: Cardiovascular;  Laterality: N/A;   HAND SURGERY Right    KNEE ARTHROPLASTY Left    NASAL SEPTUM SURGERY     RADICAL HYSTERECTOMY  2000   RIGHT HEART CATH N/A 05/22/2021   Procedure: RIGHT HEART CATH;  Surgeon: Swaziland, Peter M, MD;  Location: 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   Elevated coronary artery calcium score 12/06/2020   Mixed hyperlipidemia 10/12/2020   Primary  hypertension 01/19/2020   Chronic diastolic (congestive) heart failure (HCC) 01/19/2020   Dyspnea on exertion 11/05/2019   Hiatal hernia 11/05/2019   PSVT (paroxysmal supraventricular tachycardia) 01/01/2016   Thyroid nodule 04/14/2013   OSA on CPAP 04/06/2013   Daytime somnolence 04/06/2013    ONSET DATE: 01/16/2023 (referral date)  REFERRING DIAG: G61.0 (ICD-10-CM) - Guillain Barr syndrome (HCC)  THERAPY DIAG:  Unsteadiness on feet  Other abnormalities of gait and mobility  Muscle weakness (generalized)  Rationale for Evaluation and Treatment: Rehabilitation  SUBJECTIVE:  SUBJECTIVE STATEMENT: Patient reports that she had an RSV shot on 4/15. Patient then was reported a cough/sinus congestion on second week of May. Patient reported she the whole foot bilaterally on the 5/20 when line dacing. Continues to progress to the point she started using 6/5 due to level of instability. 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 Started/completed all IV interventions. Patient reports ongoing tingling in both hands and feet and cold in the feet. The tingling is the nerve regenerating. Patient reports that the balance has been a little rocky. Patient has no been sitting in the shower. Patient also has now stopped using the walker. She also loves line dancing but has not been back to it is all numb down there.   Pt accompanied by: self  PERTINENT HISTORY: dyspnea on exertion (thought to be due to deconditioning)   PAIN:  Are you having pain? No (Only numbness and tingling)  PRECAUTIONS: Fall  WEIGHT BEARING RESTRICTIONS: No  FALLS: Has patient fallen in last 6 months?  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 I would also like to get back to line dancing."   OBJECTIVE:   DIAGNOSTIC FINDINGS:  CT Head wo Contrast:  IMPRESSION: Unremarkable head CT.  No explanation for symptoms.  COGNITION: Overall cognitive status: Within functional limits for tasks assessed   SENSATION: Wrist and below bilaterally for UE and ankle  COORDINATION: Mild dysmetria due to sensation deficits without visual feedback  EDEMA:  Stiffness in feet but denies swelling  MUSCLE TONE: Mild decrease in tone in bilateral feet  POSTURE: rounded shoulders and forward head  LOWER EXTREMITY ROM:      To be assessed next session  Active  Right Eval Left Eval  Hip flexion    Hip extension    Hip abduction    Hip adduction    Hip internal rotation    Hip external rotation    Knee flexion    Knee extension    Ankle dorsiflexion    Ankle plantarflexion    Ankle inversion    Ankle eversion     (Blank rows = not tested)  LOWER EXTREMITY MMT:     To be assessed next session  MMT Right Eval Left Eval  Hip flexion    Hip extension    Hip abduction    Hip adduction    Hip internal rotation    Hip external rotation    Knee flexion    Knee extension    Ankle dorsiflexion    Ankle plantarflexion    Ankle inversion    Ankle eversion    (Blank rows = not tested)  BED MOBILITY:  Reports independence  TRANSFERS: Assistive device utilized: None  Sit to stand: SBA Stand to sit: SBA Chair to chair: SBA  GAIT: Gait pattern: trendelenburg and wide BOS Distance walked: 2 x 80 feet Assistive device utilized:  None Level of assistance: SBA Comments: Patient reports that she feels more hesitant and careful with walking; requires greater brain power to  focus.   FUNCTIONAL TESTS:    [facility] PT Assessment - 02/03/23 0001       Standardized Balance Assessment   Standardized Balance Assessment Five Times Sit to Stand;10 meter walk test    Five times sit to stand comments  17.2   seconds with mixed use of minor thigh assist (SBA)   10 Meter Walk 0.93   m/s with SBA (w/o AD)            PATIENT SURVEYS:  ABC scale 72% Confident  VITALS:  Vitals:   02/03/23 1354  BP: (!) 148/74  Pulse: 64   TODAY'S TREATMENT:                                                                                                                               TherAct: Educated patient on use of pulse ox at home when noticing shortness of breath. Reviewed safe readings and when to call PCP versus going to ED. Educated patient on resources available for patient to continue to learn more about GBS. Discussed sensation changes on bottom of feet including current localized numbness and general pressure.   PATIENT EDUCATION: Education details: POC, examination findings, goal collaboration, GBS symptom/progression  Person educated: Patient Education method: Explanation and video recommendation Education comprehension: verbalized understanding and needs further education  HOME EXERCISE PROGRAM: To be provided  GOALS: Goals reviewed with patient? Yes  SHORT TERM GOALS: Target date: 03/03/2023  Patient will demonstrate independence with initial HEP to continue to progress between physical therapy sessions.   Baseline: To be provided Goal status: INITIAL  2.  Functional Gait assessment to be assessed/ LTG goal written Baseline: To be assessed Goal status: INITIAL  3.  Patient will improve their 5x Sit to Stand score to less than 15 seconds without UE support to demonstrate a decreased risk for falls and improved LE  strength.   Baseline: 17.2 seconds with thigh assist Goal status: INITIAL  LONG TERM GOALS: Target date: 03/31/2023  Patient will report demonstrate independence with final HEP in order to maintain current gains and continue to progress after physical therapy discharge.   Baseline: To be assessed Goal status: INITIAL  2.  Patient will improve ABC Score to 86% or greater to indicate improved self-reported confidence in balance and sense of steadiness.    Baseline: 72% Goal status: INITIAL  3.  Patient will improve gait speed to 1.1 m/s or greater to indicate a reduced risk for falls.   Baseline: 0.93 m/s without AD Goal status: INITIAL  4.  FGA to be assessed / goal written  Baseline: To be assessed  Goal status: INITIAL  5.  Patient will improve their 5x Sit to Stand score to less than 12 seconds to demonstrate a decreased risk for falls and improved LE strength.   Baseline: 17.2 seconds with thigh assist Goal status: INITIAL  ASSESSMENT:  CLINICAL IMPRESSION: Patient is a 67 y.o. female who was seen  today for physical therapy evaluation and treatment for Guillain Barr syndrome with onset suspected around 5/20 and treated during mid June. Patient known to have baseline dyspnea with exertion and reports current cardiorespiratory status appears similar to before onset of symptoms. Patient presents with deficits in gait, sensation, and is at an increased risk for falls as indicated by 5xSTS and gait speed. Patient also reports remaining underlying weakness again indicated by 5xSTS results. Patient reports residual tingling/numbness in bilateral feet and ankles as well as hands. Patient also being falled by OT. Patient would like to return to line dancing if possible as she was participating several times as week at baseline but has not been able to since onset of symptoms. Patient will benefit from skilled PT services in order to progress towards PLOF in order to participate more easily in  activities such as line dancing.   OBJECTIVE IMPAIRMENTS: Abnormal gait, decreased activity tolerance, decreased balance, decreased endurance, decreased mobility, difficulty walking, decreased strength, impaired sensation, and impaired UE functional use.   ACTIVITY LIMITATIONS: carrying, squatting, transfers, and locomotion level  PARTICIPATION LIMITATIONS: community activity and line dancing  PERSONAL FACTORS: Fitness and 1 comorbidity: see above  are also affecting patient's functional outcome.   REHAB POTENTIAL: Good  CLINICAL DECISION MAKING: Stable/uncomplicated  EVALUATION COMPLEXITY: Low  PLAN:  PT FREQUENCY: 1x/week  PT DURATION: 8 weeks  PLANNED INTERVENTIONS: Therapeutic exercises, Therapeutic activity, Neuromuscular re-education, Balance training, Gait training, Patient/Family education, Self Care, and Re-evaluation  PLAN FOR NEXT SESSION: briefly screen coordination, strength, ROM, assess FGA and update goals, consider using fatigue severity scale, create in initial HEP   Carmelia Bake, PT, DPT 02/03/2023, 3:29 PM

## 2023-02-03 NOTE — Therapy (Unsigned)
OUTPATIENT OCCUPATIONAL THERAPY NEURO EVALUATION  Patient Name: [name] MRN: [id number] DOB:1956-03-03, 67 y.o., female Today's Date: 02/03/2023  PCP: Chilton Greathouse, MD REFERRING PROVIDER: Suanne Marker, MD  END OF SESSION:  OT End of Session - 02/03/23 1233     Visit Number 1    Number of Visits 8   + evaluation   Date for OT Re-Evaluation 04/04/23    Authorization Type MEDICARE PART A AND B    Progress Note Due on Visit 10    OT Start Time 1234    OT Stop Time 1314    OT Time Calculation (min) 40 min    Equipment Utilized During Treatment Testing Material    Activity Tolerance Patient tolerated treatment well    Behavior During Therapy [facility] for tasks assessed/performed             Past Medical History:  Diagnosis Date   Anxiety    Daytime somnolence 04/06/2013   DDD (degenerative disc disease), lumbar    Depression    Hypercholesteremia    Hypertension    OSA on CPAP 04/06/2013   Sleep apnea    Urinary incontinence    Past Surgical History:  Procedure Laterality Date   ABLATION  09/29/2017   BACK SURGERY  04/06/2019   BREAST EXCISIONAL BIOPSY Left    CATARACT EXTRACTION Bilateral    ELECTROPHYSIOLOGIC STUDY N/A 01/01/2016   Procedure: SVT Ablation;  Surgeon: Marinus Maw, MD;  Location: [facility] INVASIVE CV LAB;  Service: Cardiovascular;  Laterality: N/A;   HAND SURGERY Right    KNEE ARTHROPLASTY Left    NASAL SEPTUM SURGERY     RADICAL HYSTERECTOMY  2000   RIGHT HEART CATH N/A 05/22/2021   Procedure: RIGHT HEART CATH;  Surgeon: Swaziland, Peter M, MD;  Location: [facility] INVASIVE CV LAB;  Service: Cardiovascular;  Laterality: N/A;   TONSILLECTOMY AND ADENOIDECTOMY     Patient Active Problem List   Diagnosis Date Noted   Guillain Barr syndrome (HCC) 01/16/2023   Coronary artery disease involving native coronary artery of native heart without angina pectoris 08/24/2021   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   Elevated coronary artery calcium  score 12/06/2020   Mixed hyperlipidemia 10/12/2020   Primary hypertension 01/19/2020   Chronic diastolic (congestive) heart failure (HCC) 01/19/2020   Dyspnea on exertion 11/05/2019   Hiatal hernia 11/05/2019   PSVT (paroxysmal supraventricular tachycardia) 01/01/2016   Thyroid nodule 04/14/2013   OSA on CPAP 04/06/2013   Daytime somnolence 04/06/2013    ONSET DATE: Referral: 01/16/2023 Onset: 12/16/22  REFERRING DIAG: G61.0 (ICD-10-CM) - Guillain Barr syndrome  THERAPY DIAG:  Other lack of coordination  Muscle weakness (generalized)  Other disturbances of skin sensation  Other symptoms and signs involving the nervous system  Unsteadiness on feet  Rationale for Evaluation and Treatment: Rehabilitation  SUBJECTIVE:   SUBJECTIVE STATEMENT: Patient had a printed calendar from April to present to help report issues she has had.  She got an RSV shot on 4/15 and then developed a cough/sinus congestion on the second week of May. Patient noticed numbness sensation in L foot 5/20 when line dancing which continued to worsen until 6/5 when she began using a walker due to level of instability. 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 Started/completed all IV interventions mid-June with improvements noted in all areas ie) had been unable to write due to tingling in both hands as well as her feet. Patient reports that her balance improved and she  stopped using the walker at the end of June. Patient has been sitting in the shower due to unsteadiness and has not been able to return to line dancing or playing the ukulele like she used to due to the numbness and trouble with her coordination.   Pt accompanied by: self  PERTINENT HISTORY:   67 year old female here for evaluation of rapidly progressive ascending numbness and weakness.   Symptoms started around Dec 16, 2022.  Patient noticed a numbness sensation on her left forefoot and toes.  This progressed to the right  foot and up her legs.  Within a week she had numbness and tingling and weakness in her hands up to her elbows.  She went to the emergency room twice for evaluation.  She was diagnosed with neuropathy and was recommended to follow-up with neurology.   Of note patient had preceding upper respiratory infection symptoms Dec 12, 2022.  She also had RSV vaccination earlier in May.   Symptoms have plateaued since 01/04/23.  Using a walker at home.  She feels weakness in her hands, arms, legs.  No breathing issues.  No swallowing issues.  No prior similar symptoms.  PRECAUTIONS: No  WEIGHT BEARING RESTRICTIONS: No  PAIN:  Are you having pain? Yes: NPRS scale: 6/10 Pain location: gout flare (started on R but now it's mostly L foot) Pain description: numbness and tingling (both hands and feet) Aggravating factors: potentially activity but hard to determine Relieving factors: Gout medication but it has side effective r/t bowels making it difficult to take.  Rest is not helpful much either   FALLS: Has patient fallen in last 6 months? 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 Grooming: MI UB Dressing: 2 weeks ago - she could not fasten bra LB Dressing: Still feels stiff when  putting on socks and is still slower at this task.  Reports she can tie her laces now. Toileting: MI Bathing: Sitting to bath due to being unsteady Tub Shower transfers: MI with walk in shower Equipment: Shower seat with back  IADLs: Shopping: Patient was initailly driven by her husband for about 3 weeks but is now drving and returning to community outings/tasks on her own  Light housekeeping: Can help do her laundry now. Meal Prep: Husband does meal prep (previously did so also) Community mobility: Was using walker until about 6/5 - 01/17/23 - staying at home during that time Medication management: Returning to ability to open pill containers Financial management: Not asked. 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  COORDINATION:  9 Hole Peg test: Right: 23.05  sec; Left: 24.11  sec (Patient does reports task feeling funny and awkward compared to normal).  SENSATION: Light touch: Impaired palmar surfaces are tingling even with different temperatures Diminished from wrist and below bilaterally for UEs palmar surfaces and below ankles   EDEMA: noted fingers being stiff and swollen and L toes  MUSCLE TONE: WFL  COGNITION: Overall cognitive status: Within functional limits for tasks assessed  VISION: Subjective report: H/O cataract removal.  Has different glasses for distance and for reading Baseline vision: Wears glasses for distance only and Wears glasses for reading only Visual history: cataracts  VISION ASSESSMENT: Not tested  Patient  reports no difficulty with activities due to vision impairments  PERCEPTION: Not tested  PRAXIS: Not tested  OBSERVATIONS: Patient is a 67 y.o. female who was seen today for occupational therapy evaluation for UE numbness and tingling with resulting weakness and incoordination as a result of Guillain Barre Syndrome. Hx includes recent dx of probable Guillain Barre Syndrome. Patient currently presents below baseline level of functioning demonstrating functional deficits and impairments as noted below. Pt would benefit from skilled OT services in the outpatient setting to work on impairments as noted below to help pt return to PLOF as able.       TODAY'S TREATMENT:                                                                                                                              DATE: NA   PATIENT EDUCATION: Education details: OT POC and role Person educated: Patient Education method: Explanation Education comprehension: verbalized understanding and needs further education  HOME EXERCISE PROGRAM: TBD   GOALS: Goals reviewed with patient? Yes  SHORT TERM GOALS: Target date: 03/07/23  Patient will demonstrate UE strength/coordination HEP with 25% verbal cues or less for proper execution.  Baseline: To be provided Goal status: INITIAL  2.  Patient will demonstrate improved sensory awareness/Stereognosis to identify different items in her purse/pocket and different coins with vision occluded.  Baseline: TBA - Tingling & numbness in B palmar surfaces  Goal status: INITIAL  3.  Patient will improve pinch strength/coordination & sensation of B hands to increase ease & independence in tying shoelaces, and manage buttons with vision occluded Baseline: Self reported difficulty. Goal status: INITIAL   LONG TERM GOALS: Target date: 04/04/23  Patient will demonstrate MI with sensory stimulation HEP with MI  Baseline: To be provided. Goal status: INITIAL  2.  Patient will  demonstrate at least 30 lbs R UE grip strength and 25 lbs L UE grip strength as needed to open jars and other containers.  Baseline: Rt 22 lbs; Lt 18 lbs Goal status: INITIAL  3.  Patient  will demonstrate UE coordination to resume playing the ukulele  Baseline: Unable to move fingers for different cords by self report. Goal status: INITIAL  ASSESSMENT:  CLINICAL IMPRESSION: Patient is a 67 y.o. female who was seen today for occupational therapy evaluation for UE coordination and strength deficits due to Guillain Barre Syndrome affecting B hands (palmar surfaces especially). Hx includes onset of symptoms last month with some spontaneous recovery following IVIG treatment mid-June but patient currently presents below baseline level of functioning for prior activities such as line dancing and playing the ukulele demonstrating functional deficits and impairments as noted below. Pt would benefit from skilled OT services in the outpatient setting to work on impairments as noted below to help pt return to PLOF as able.     PERFORMANCE DEFICITS: in functional skills including ADLs, coordination, dexterity, sensation, strength, flexibility, Fine motor control, decreased knowledge of precautions, and UE functional use.  IMPAIRMENTS: are limiting patient from ADLs, leisure, and social participation.   CO-MORBIDITIES: may have co-morbidities  that affects occupational performance. Patient will benefit from skilled OT to address above impairments and improve overall function.  MODIFICATION OR ASSISTANCE TO COMPLETE EVALUATION: No modification of tasks or assist necessary to complete an evaluation.  OT OCCUPATIONAL PROFILE AND HISTORY: Problem focused assessment: Including review of records relating to presenting problem.  CLINICAL DECISION MAKING: LOW - limited treatment options, no task modification necessary  REHAB POTENTIAL: Excellent  EVALUATION COMPLEXITY: Low    PLAN:  OT FREQUENCY:  1x/week  OT DURATION: 8 weeks  PLANNED INTERVENTIONS: self care/ADL training, therapeutic exercise, therapeutic activity, neuromuscular re-education, balance training, fluidotherapy, patient/family education, coping strategies training, and DME and/or AE instructions  RECOMMENDED OTHER SERVICES: PT evaluation also occurred today.  CONSULTED AND AGREED WITH PLAN OF CARE: Patient  PLAN FOR NEXT SESSION: Review OT goals Sensory loss education re: safety and sensory stimulation Coordination HEP ideas.   Victorino Sparrow, OT 02/03/2023, 4:40 PM

## 2023-02-04 ENCOUNTER — Telehealth: Payer: Self-pay | Admitting: Diagnostic Neuroimaging

## 2023-02-04 NOTE — Telephone Encounter (Signed)
Tanya called back and I was able to get the call. She was confirming that we received the notes on her care through them. Advised that I had reviewed them and placed them on [facility] desk for review. She wanted to know if the patient was having a follow up apt or follow up care. Advised I will contact the patient and see about getting her worked in for an apt.

## 2023-02-04 NOTE — Telephone Encounter (Signed)
Saunders Revel  [employer]) is asking for a call re: f/u plan for IVIG

## 2023-02-10 ENCOUNTER — Ambulatory Visit: Payer: Medicare Other | Admitting: Physical Therapy

## 2023-02-10 VITALS — BP 132/88 | HR 60

## 2023-02-10 DIAGNOSIS — M6281 Muscle weakness (generalized): Secondary | ICD-10-CM

## 2023-02-10 DIAGNOSIS — R2681 Unsteadiness on feet: Secondary | ICD-10-CM

## 2023-02-10 DIAGNOSIS — R2689 Other abnormalities of gait and mobility: Secondary | ICD-10-CM

## 2023-02-10 DIAGNOSIS — R278 Other lack of coordination: Secondary | ICD-10-CM | POA: Diagnosis not present

## 2023-02-10 NOTE — Therapy (Signed)
OUTPATIENT PHYSICAL THERAPY NEURO TREATMENT   Patient Name: [name] MRN: [id number] DOB:1956-06-05, 67 y.o., female Today's Date: 02/10/2023  PCP: Suanne Marker, MD REFERRING PROVIDER: Suanne Marker, MD  END OF SESSION:  PT End of Session - 02/10/23 1624     Visit Number 2    Number of Visits 9    Date for PT Re-Evaluation 04/14/23    Authorization Type Medicare    PT Start Time 1622    PT Stop Time 1702    PT Time Calculation (min) 40 min    Equipment Utilized During Treatment Gait belt    Activity Tolerance Patient tolerated treatment well    Behavior During Therapy [facility] for tasks assessed/performed             Past Medical History:  Diagnosis Date   Anxiety    Daytime somnolence 04/06/2013   DDD (degenerative disc disease), lumbar    Depression    Hypercholesteremia    Hypertension    OSA on CPAP 04/06/2013   Sleep apnea    Urinary incontinence    Past Surgical History:  Procedure Laterality Date   ABLATION  09/29/2017   BACK SURGERY  04/06/2019   BREAST EXCISIONAL BIOPSY Left    CATARACT EXTRACTION Bilateral    ELECTROPHYSIOLOGIC STUDY N/A 01/01/2016   Procedure: SVT Ablation;  Surgeon: Marinus Maw, MD;  Location: [facility] INVASIVE CV LAB;  Service: Cardiovascular;  Laterality: N/A;   HAND SURGERY Right    KNEE ARTHROPLASTY Left    NASAL SEPTUM SURGERY     RADICAL HYSTERECTOMY  2000   RIGHT HEART CATH N/A 05/22/2021   Procedure: RIGHT HEART CATH;  Surgeon: [place], Peter M, MD;  Location: [facility] INVASIVE CV LAB;  Service: Cardiovascular;  Laterality: N/A;   TONSILLECTOMY AND ADENOIDECTOMY     Patient Active Problem List   Diagnosis Date Noted   Guillain Barr syndrome (HCC) 01/16/2023   Coronary artery disease involving native coronary artery of native heart without angina pectoris 08/24/2021   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   Elevated coronary artery calcium score 12/06/2020   Mixed hyperlipidemia 10/12/2020   Primary  hypertension 01/19/2020   Chronic diastolic (congestive) heart failure (HCC) 01/19/2020   Dyspnea on exertion 11/05/2019   Hiatal hernia 11/05/2019   PSVT (paroxysmal supraventricular tachycardia) 01/01/2016   Thyroid nodule 04/14/2013   OSA on CPAP 04/06/2013   Daytime somnolence 04/06/2013    ONSET DATE: 01/16/2023 (referral date)  REFERRING DIAG: G61.0 (ICD-10-CM) - Guillain Barr syndrome (HCC)  THERAPY DIAG:  Unsteadiness on feet  Other abnormalities of gait and mobility  Muscle weakness (generalized)  Rationale for Evaluation and Treatment: Rehabilitation  SUBJECTIVE:  SUBJECTIVE STATEMENT: Denies falls/near falls. Reports feeling some minor unsteadiness. States that her nerve pain both hands and feet kept her up in the night.   Pt accompanied by: self  PERTINENT HISTORY: dyspnea on exertion (thought to be due to deconditioning)   PAIN:  Are you having pain? No (Only numbness and tingling)  PRECAUTIONS: Fall  WEIGHT BEARING RESTRICTIONS: No  FALLS: Has patient fallen in last 6 months?  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 I would also like to get back to line dancing."   OBJECTIVE:   DIAGNOSTIC FINDINGS:  CT Head wo Contrast:  IMPRESSION: Unremarkable head CT.  No explanation for symptoms.  COGNITION: Overall  cognitive status: Within functional limits for tasks assessed    LOWER EXTREMITY ROM:      Grossly WFL  LOWER EXTREMITY MMT:    MMT Right Eval Left Eval  Hip flexion 4-/5 4-/5  Hip extension    Hip abduction 3+/5 3+/5  Hip adduction 3+/5 3+/5  Hip internal rotation    Hip external rotation    Knee flexion 3+/5 3+/5  Knee extension 4-/5 4-/5  Ankle dorsiflexion 3+/5 3+/5  Ankle plantarflexion    Ankle inversion    Ankle eversion    (Blank rows = not tested)   VITALS:  Vitals:   02/10/23 1629  BP: 132/88  Pulse: 60    TODAY'S TREATMENT:                                                                                                                               TherAct: Discussed pain management strategies including TENS, pool therapy, compression socks, and capsacin patches. Patient to follow up about interest in above options next session for future therapy sessions.   [facility] PT Assessment - 02/10/23 0001       Functional Gait  Assessment   Gait assessed  Yes    Gait Level Surface Walks 20 ft, slow speed, abnormal gait pattern, evidence for imbalance or deviates 10-15 in outside of the 12 in walkway width. Requires more than 7 sec to ambulate 20 ft.   8 seconds   Change in Gait Speed Able to change speed, demonstrates mild gait deviations, deviates 6-10 in outside of the 12 in walkway width, or no gait deviations, unable to achieve a major change in velocity, or uses a change in velocity, or uses an assistive device.   demonstrates reduced change in speed   Gait with Horizontal Head  Turns Performs head turns with moderate changes in gait velocity, slows down, deviates 10-15 in outside 12 in walkway width but recovers, can continue to walk.   stutter step, mild-mod weaving   Gait with Vertical Head Turns Performs task with slight change in gait velocity (eg, minor disruption to smooth gait path), deviates 6 - 10 in outside 12 in walkway width or uses assistive device     Gait and Pivot Turn Pivot turns safely in greater than 3 sec and stops with no loss of balance, or pivot turns safely within 3 sec and stops with mild imbalance, requires small steps to catch balance.    Step Over Obstacle Is able to step over one shoe box (4.5 in total height) without changing gait speed. No evidence of imbalance.    Gait with Narrow Base of Support Ambulates less than 4 steps heel to toe or cannot perform without assistance.    Gait with Eyes Closed Walks 20 ft, uses assistive device, slower speed, mild gait deviations, deviates 6-10 in outside 12 in walkway width. Ambulates 20 ft in less than 9 sec but greater than 7 sec.    Ambulating Backwards Walks 20 ft, uses assistive device, slower speed, mild gait deviations, deviates 6-10 in outside 12 in walkway width.    Steps Alternating feet, must use rail.    Total Score 16    FGA comment: 16/30 = Increased risk for falls            Initial HEP Reviewed (TherEx):   Exercises - Sit to Stand Without Arm Support  - 3 sets - 10 reps - Tandem Walking with Counter Support  - 3 sets x 8 feet - Corner Balance Feet Together With Eyes Closed  - 30 seconds hold - Corner Balance Feet Together: Eyes Closed With Head Turns - 10 reps  PATIENT EDUCATION: Education details: Initial HEP Person educated: Patient Education method: Explanation and video recommendation Education comprehension: verbalized understanding and needs further education  HOME EXERCISE PROGRAM: Access Code: [email or web address] URL: [email or web address] Date: 02/10/2023 Prepared by: Maryruth Eve  Exercises - Sit to Stand Without Arm Support  - 1 x daily - 5 x weekly - 3 sets - 10 reps - Tandem Walking with Counter Support  - 1 x daily - 5 x weekly - 3 sets - Corner Balance Feet Together With Eyes Closed  - 1 x daily - 7 x weekly - 3 sets - 30 seconds hold - Corner Balance Feet Together: Eyes Closed With Head Turns  - 1 x daily - 7 x weekly - 3 sets - 10  reps  GOALS: Goals reviewed with patient? Yes  SHORT TERM GOALS: Target date: 03/03/2023  Patient will demonstrate independence with initial HEP to continue to progress between physical therapy sessions.   Baseline: To be provided Goal status: INITIAL  2.  Functional Gait assessment to be assessed/ LTG goal written Baseline: Assessed on 7/15 Goal status: MET  3.  Patient will improve their 5x Sit to Stand score to less than 15 seconds without UE support to demonstrate a decreased risk for falls and improved LE strength.   Baseline: 17.2 seconds with thigh assist Goal status: INITIAL  LONG TERM GOALS: Target date: 03/31/2023  Patient will report demonstrate independence with final HEP in order to maintain current gains and continue to progress after physical therapy discharge.   Baseline: To be assessed Goal status: INITIAL  2.  Patient will improve ABC Score to 86% or greater to  indicate improved self-reported confidence in balance and sense of steadiness.    Baseline: 72% Goal status: INITIAL  3.  Patient will improve gait speed to 1.1 m/s or greater to indicate a reduced risk for falls.   Baseline: 0.93 m/s without AD Goal status: INITIAL  4.  Patient will improve FGA to greater than 22/30 to indicate a decreased risk of falls and improved dynamic stability.    Baseline: 16/30  Goal status: INITIAL  5.  Patient will improve their 5x Sit to Stand score to less than 12 seconds to demonstrate a decreased risk for falls and improved LE strength.   Baseline: 17.2 seconds with thigh assist Goal status: INITIAL  ASSESSMENT:  CLINICAL IMPRESSION: Session emphasized assessment of FGA, discussion of pain management strategies for neuropathic pain, and establishment of initial HEP. Patient demonstrates increased risk for falls as indicated by FGA. Also provided patient information regarding TENS, compression socks, aquatic therapy, and patches to consider to help minimize pain if  physician considered appropriate. Patient tolerated session well. Patient will benefit from skilled PT services in order to progress towards PLOF in order to participate more easily in activities such as line dancing and decrease falls risk. Continue POC.  OBJECTIVE IMPAIRMENTS: Abnormal gait, decreased activity tolerance, decreased balance, decreased endurance, decreased mobility, difficulty walking, decreased strength, impaired sensation, and impaired UE functional use.   ACTIVITY LIMITATIONS: carrying, squatting, transfers, and locomotion level  PARTICIPATION LIMITATIONS: community activity and line dancing  PERSONAL FACTORS: Fitness and 1 comorbidity: see above  are also affecting patient's functional outcome.   REHAB POTENTIAL: Good  CLINICAL DECISION MAKING: Stable/uncomplicated  EVALUATION COMPLEXITY: Low  PLAN:  PT FREQUENCY: 1x/week  PT DURATION: 8 weeks  PLANNED INTERVENTIONS: Therapeutic exercises, Therapeutic activity, Neuromuscular re-education, Balance training, Gait training, Patient/Family education, Self Care, and Re-evaluation  PLAN FOR NEXT SESSION:  consider using fatigue severity scale, review+ progress initial HEP, balance stretching, activities for line dancing, pain management   Carmelia Bake, PT, DPT 02/10/2023, 5:52 PM

## 2023-02-12 ENCOUNTER — Encounter: Payer: Self-pay | Admitting: Acute Care

## 2023-02-12 ENCOUNTER — Ambulatory Visit: Payer: Medicare Other | Admitting: Occupational Therapy

## 2023-02-12 DIAGNOSIS — R208 Other disturbances of skin sensation: Secondary | ICD-10-CM

## 2023-02-12 DIAGNOSIS — R278 Other lack of coordination: Secondary | ICD-10-CM | POA: Diagnosis not present

## 2023-02-12 DIAGNOSIS — M6281 Muscle weakness (generalized): Secondary | ICD-10-CM

## 2023-02-12 NOTE — Patient Instructions (Addendum)
Putty Exercises:  Access Code: [email or web address] URL: [email or web address] Date: 02/12/2023 Prepared by: Amada Kingfisher  Exercises - Putty Squeezes  - 1 x daily - 2 sets - 10 reps - 3-Point Pinch with Putty  - 1 x daily - 2 sets - 10 reps - Tip PUSH with Putty  - 1 x daily - 2 sets - 10 reps - Key Pinch with Putty  - 1 x daily - 3 sets - 10 reps - Finger Pinch and Pull with Putty  - 1 x daily - 2 sets - 10 reps - Removing Marbles from Putty  - 1 x daily - 2 sets - 10 reps  Re/desensitization Techniques Some ways to Re/desensitize a hyper or hyposensitive ie) numb, tingling limb/area is by rubbing it with different textures. This will make your limb more tolerant/aware of touch and pressure. Before you begin, make sure your hands and the materials you're using are clean.  To rub your painful/sensitive area with different textures: Sit in a comfortable position with the numb/painful/sensitive area uncovered. Start with a material that is soft, such as a cotton ball, silk or soft towel. Rub your painful/sensitive area in all directions. Start with a light pressure and gradually increase the pressure. Vary the textures you use, as you can tolerate them. Start with soft materials like cotton balls or a makeup brush. Progress to materials that are rougher. Examples include a paper towel, cloth towel, wool, or velcro. As you progress, gradually increase the pressure and roughness of the texture you use. Be careful not to rub over any incisions or wounds, if you still have staples or sutures in place, or if there are any open areas. Rub your limb for at least 30 seconds progressing up to a minute or two as often as you can tolerate, or as recommended by your healthcare provider. Be careful not to irritate your skin or rub your skin raw.  Stop rubbing your affected area if you notice redness that does not dissipate in 15-30 minutes, bleeding or opening skin, and contact your healthcare  provider.  Other methods for re/desensitization include: Allow cool or warm water to run over the area.  Be careful not to get the water too hot, especially if you have decreases sensation of temperature. Putting your affected area into a bowl of dry rice, sand, kidney beans, cold water or warm water. You can hide objects in the dry materials to find. Make a bag of miscellaneous matching objects to feel and match based on feel ie) paper clips, nuts, bolts, dice, marbles, checkers, dominos, beads etc.  If you choose to start with the method of dipping your affected area into a medium such as rice, sand, or water make sure to move the affected area around in the bowl until you cannot tolerate it or you reach one to two minutes, whichever comes first. You can use a combination of these methods for 10 to 15 minutes, 3-4 times per day.

## 2023-02-12 NOTE — Therapy (Signed)
OUTPATIENT OCCUPATIONAL THERAPY NEURO TREATMENT  Patient Name: [name] MRN: [id number] DOB:[birth year], 67 y.o., female Today's Date: 02/12/2023  PCP: Chilton Greathouse, MD REFERRING PROVIDER: Suanne Marker, MD  END OF SESSION:  OT End of Session - 02/12/23 1320     Visit Number 2    Number of Visits 8   + evaluation   Date for OT Re-Evaluation 04/04/23    Authorization Type MEDICARE PART A AND B    Progress Note Due on Visit 10    OT Start Time 1321    OT Stop Time 1400    OT Time Calculation (min) 39 min    Equipment Utilized During Treatment Putty    Activity Tolerance Patient tolerated treatment well    Behavior During Therapy [facility] for tasks assessed/performed             Past Medical History:  Diagnosis Date   Anxiety    Daytime somnolence 04/06/2013   DDD (degenerative disc disease), lumbar    Depression    Hypercholesteremia    Hypertension    OSA on CPAP 04/06/2013   Sleep apnea    Urinary incontinence    Past Surgical History:  Procedure Laterality Date   ABLATION  09/29/2017   BACK SURGERY  04/06/2019   BREAST EXCISIONAL BIOPSY Left    CATARACT EXTRACTION Bilateral    ELECTROPHYSIOLOGIC STUDY N/A 01/01/2016   Procedure: SVT Ablation;  Surgeon: Marinus Maw, MD;  Location: [facility] INVASIVE CV LAB;  Service: Cardiovascular;  Laterality: N/A;   HAND SURGERY Right    KNEE ARTHROPLASTY Left    NASAL SEPTUM SURGERY     RADICAL HYSTERECTOMY  2000   RIGHT HEART CATH N/A 05/22/2021   Procedure: RIGHT HEART CATH;  Surgeon: Swazil[place], Peter M, MD;  Location: [facility] INVASIVE CV LAB;  Service: Cardiovascular;  Laterality: N/A;   TONSILLECTOMY AND ADENOIDECTOMY     Patient Active Problem List   Diagnosis Date Noted   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   Coronary artery disease involving native coronary artery of native heart without angina pectoris 08/24/2021   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   Elevated coronary artery calcium score  12/06/2020   Mixed hyperlipidemia 10/12/2020   Primary hypertension 01/19/2020   Chronic diastolic (congestive) heart failure (HCC) 01/19/2020   Dyspnea on exertion 11/05/2019   Hiatal hernia 11/05/2019   PSVT (paroxysmal supraventricular tachycardia) 01/01/2016   Thyroid nodule 04/14/2013   OSA on CPAP 04/06/2013   Daytime somnolence 04/06/2013    ONSET DATE: Referral: 01/16/2023 Onset: 12/16/22  REFERRING DIAG: G61.0 (ICD-10-CM) - Guillain Barr syndrome  THERAPY DIAG:  Other disturbances of skin sensation  Muscle weakness (generalized)  Other lack of coordination  Rationale for Evaluation and Treatment: Rehabilitation  SUBJECTIVE:   SUBJECTIVE STATEMENT: Patient reports trying to play the ukulele every day but does not have the ability to move as quickly as she did before.   Pt accompanied by: self  PERTINENT HISTORY:   67 year old female here for evaluation of rapidly progressive ascending numbness and weakness.   Symptoms started around Dec 16, 2022.  Patient noticed a numbness sensation on her left forefoot and toes.  This progressed to the right foot and up her legs.  Within a week she had numbness and tingling and weakness in her hands up to her elbows.  She went to the emergency room twice for evaluation.  She was diagnosed with neuropathy and was recommended to follow-up with neurology.   Of note patient  had preceding upper respiratory infection symptoms Dec 12, 2022.  She also had RSV vaccination earlier in May.   Symptoms have plateaued since 01/04/23.  Using a walker at home.  She feels weakness in her hands, arms, legs.  No breathing issues.  No swallowing issues.  No prior similar symptoms.  PRECAUTIONS: No  WEIGHT BEARING RESTRICTIONS: No  PAIN:  Are you having pain? Yes: NPRS scale: 6/10 Pain location: gout flare is improving; both hands and feet Pain description: tingling - pins and needles (both hands and feet) Aggravating factors: potentially  activity but hard to determine Relieving factors: Gabapentin (missed a dose on Monday and things were worse)  FALLS: Has patient fallen in last 6 months? 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 Grooming: MI UB Dressing: 2 weeks ago - she could not fasten bra LB Dressing: Still feels stiff when putting on socks and is still slower at this task.  Reports she can tie her laces now. Toileting: MI Bathing: Sitting to bath due to being unsteady Tub Shower transfers: MI with walk in shower Equipment: Shower seat with back  IADLs: Shopping: Patient was initailly driven by her husband for about 3 weeks but is now drving and returning to community outings/tasks on her own  Light housekeeping: Can help do her laundry now. Meal Prep: Husband does meal prep (previously did so also) Community mobility: Was using walker until about 6/5 - 01/17/23 - staying at home during that time Medication management: Returning to ability to open pill containers Financial management: Not asked. Handwriting: 90% legible  MOBILITY STATUS: Independent  POSTURE COMMENTS:  rounded shoulders and forward head Sitting balance: WFL  ACTIVITY TOLERANCE: Activity tolerance: Fair  FUNCTIONAL  OUTCOME MEASURES: TBD  UPPER EXTREMITY ROM:    Grossly WNL  UPPER EXTREMITY MMT:    Grossly 4-/5 throughout shoulders etc  HAND FUNCTION: Grip strength: Right: 21.7, 22.2  lbs; Left: 17.8, 18.2  lbs  Congenital L pinkie finger missing and R pinkie finger PIP joint laterally flexed.  COORDINATION:  9 Hole Peg test: Right: 23.05  sec; Left: 24.11  sec (Patient does reports task feeling funny and awkward compared to normal).  SENSATION: Light touch: Impaired palmar surfaces are tingling even with different temperatures Diminished from wrist and below bilaterally for UEs palmar surfaces and below ankles   EDEMA: noted fingers being stiff and swollen and L toes  MUSCLE TONE: WFL  COGNITION: Overall cognitive status: Within functional limits for tasks assessed  VISION: Subjective report: H/O cataract removal.  Has different glasses for distance and for reading Baseline vision: Wears glasses for distance only and Wears glasses for reading only Visual history: cataracts  VISION ASSESSMENT: Not tested  Patient reports no difficulty with activities  due to vision impairments  PERCEPTION: Not tested  PRAXIS: Not tested  EVALUATION OBSERVATIONS: Patient is a 67 y.o. female who was seen today for occupational therapy evaluation for UE numbness and tingling with resulting weakness and incoordination as a result of Guillain Barre Syndrome. Hx includes recent dx of probable Guillain Barre Syndrome. Patient currently presents below baseline level of functioning demonstrating functional deficits and impairments as noted below. Pt would benefit from skilled OT services in the outpatient setting to work on impairments as noted below to help pt return to PLOF as able.       TODAY'S TREATMENT:                                                                                                                                  Therapeutic Exercises:  Initiated Putty Exercises with yellow (soft)  putty to begin strengthening, coordination and sensory stimulation of B UEs.  Patient provided visual demo, verbal and tactile cues as needed to improve performance of the various exercises:   - Putty Squeezes - cues to squeeze putty into log for use with other exercises and to fold putty in half with 1 hand   - Pinch and Pull with Putty combined with pinches (3-Point Pinch, Tip Pinch, Key Pinch) - patient encouraged to combine tripod, pincer and/or key pinch with "pinch and pull" motion of putty away from midline changing between different pinches  - Removing Objects from Putty  - encouraged to hide items (coins, marble, dice etc) and use one hand at a time to find the objects and identify them by tactile input before she digs them out and can see them visually.  Self Care:  Introduced Re/desensitization Techniques for hyper or hyposensitive fingers ie) numb distally and tingling in palmar region ie) by rubbing hands and fingers with different textures to increased tolerance/awareness of touch and pressure. Encouraged to vary the textures, ensure good skin integrity and also to consider water/rice as an option also.  She reports water does seem to irritate her hands more and she is encouraged to gradually increased tolerance to sensory input by combining task with deep pressure as able.  PATIENT EDUCATION: Education details: Putty Exercises and Re/desensitization  Person educated: Patient Education method: Explanation, Demonstration, Tactile cues, Verbal cues, and Handouts Education comprehension: verbalized understanding, returned demonstration, verbal cues required, tactile cues required, and needs further education  HOME EXERCISE PROGRAM: 02/12/23 Putty Exercises: Access Code: [email or web address]   GOALS: Goals reviewed with patient? Yes  SHORT TERM GOALS: Target date: 03/07/23  Patient will demonstrate UE strength/coordination HEP with 25% verbal cues or less for proper execution.  Baseline: To be  provided Goal status: IN PROGRESS  2.  Patient will demonstrate improved sensory awareness/Stereognosis to identify different items in her purse/pocket and different coins with vision occluded.  Baseline: TBA - Tingling & numbness in B palmar surfaces  Goal status: IN PROGRESS  3.  Patient will  improve pinch strength/coordination & sensation of B hands to increase ease & independence in tying shoelaces, and manage buttons with vision occluded Baseline: Self reported difficulty. Goal status: IN PROGRESS   LONG TERM GOALS: Target date: 04/04/23  Patient will demonstrate MI with sensory stimulation HEP with MI  Baseline: To be provided. Goal status: IN PROGRESS  2.  Patient will demonstrate at least 30 lbs R UE grip strength and 25 lbs L UE grip strength as needed to open jars and other containers.  Baseline: Rt 22 lbs; Lt 18 lbs Goal status: IN PROGRESS  3.  Patient will demonstrate UE coordination to resume playing the ukulele  Baseline: Unable to move fingers for different cords by self report. Goal status: IN PROGRESS  ASSESSMENT:  CLINICAL IMPRESSION: Patient is a 68 y.o. female who returned today for her first OT visit s/p evaluation for UE dysfunction due to neuropathy and resulting coordination and strength deficits due to Guillain Barre Syndrome affecting B hands (palmar surfaces especially). She responded well to education for putty exercises for strengthening, coordination and sensroy awareness. Pt will benefit from continued skilled OT services in the outpatient setting to work on sensory and coordination deficits to help pt return to PLOF as able.     PERFORMANCE DEFICITS: in functional skills including ADLs, coordination, dexterity, sensation, strength, flexibility, Fine motor control, decreased knowledge of precautions, and UE functional use.  IMPAIRMENTS: are limiting patient from ADLs, leisure, and social participation.   CO-MORBIDITIES: may have co-morbidities  that  affects occupational performance. Patient will benefit from skilled OT to address above impairments and improve overall function.  REHAB POTENTIAL: Excellent  PLAN:  OT FREQUENCY: 1x/week  OT DURATION: 8 weeks  PLANNED INTERVENTIONS: self care/ADL training, therapeutic exercise, therapeutic activity, neuromuscular re-education, balance training, fluidotherapy, patient/family education, coping strategies training, and DME and/or AE instructions  RECOMMENDED OTHER SERVICES: PT treatments in place s/p eval on same day as OT.  CONSULTED AND AGREED WITH PLAN OF CARE: Patient  PLAN FOR NEXT SESSION:  Review sensory stimulation education and provide safety recommendations. Coordination HEP ideas.   Victorino Sparrow, OT 02/12/2023, 3:18 PM

## 2023-02-17 ENCOUNTER — Ambulatory Visit: Payer: Medicare Other | Admitting: Physical Therapy

## 2023-02-17 ENCOUNTER — Encounter: Payer: Self-pay | Admitting: Physical Therapy

## 2023-02-17 VITALS — BP 149/78 | HR 64

## 2023-02-17 DIAGNOSIS — R278 Other lack of coordination: Secondary | ICD-10-CM | POA: Diagnosis not present

## 2023-02-17 DIAGNOSIS — R2681 Unsteadiness on feet: Secondary | ICD-10-CM

## 2023-02-17 DIAGNOSIS — M6281 Muscle weakness (generalized): Secondary | ICD-10-CM

## 2023-02-17 DIAGNOSIS — R2689 Other abnormalities of gait and mobility: Secondary | ICD-10-CM

## 2023-02-17 DIAGNOSIS — Z0289 Encounter for other administrative examinations: Secondary | ICD-10-CM

## 2023-02-17 DIAGNOSIS — R208 Other disturbances of skin sensation: Secondary | ICD-10-CM

## 2023-02-17 NOTE — Therapy (Unsigned)
OUTPATIENT PHYSICAL THERAPY NEURO TREATMENT   Patient Name: [name] MRN: [id number] DOB:06/09/1956, 67 y.o., female Today's Date: 02/17/2023  PCP: Suanne Marker, MD REFERRING PROVIDER: Suanne Marker, MD  END OF SESSION:  PT End of Session - 02/17/23 1406     Visit Number 3    Number of Visits 9    Date for PT Re-Evaluation 04/14/23    Authorization Type Medicare    PT Start Time 1401    Equipment Utilized During Treatment Gait belt    Activity Tolerance Patient tolerated treatment well    Behavior During Therapy [facility] for tasks assessed/performed             Past Medical History:  Diagnosis Date   Anxiety    Daytime somnolence 04/06/2013   DDD (degenerative disc disease), lumbar    Depression    Hypercholesteremia    Hypertension    OSA on CPAP 04/06/2013   Sleep apnea    Urinary incontinence    Past Surgical History:  Procedure Laterality Date   ABLATION  09/29/2017   BACK SURGERY  04/06/2019   BREAST EXCISIONAL BIOPSY Left    CATARACT EXTRACTION Bilateral    ELECTROPHYSIOLOGIC STUDY N/A 01/01/2016   Procedure: SVT Ablation;  Surgeon: Marinus Maw, MD;  Location: [facility] INVASIVE CV LAB;  Service: Cardiovascular;  Laterality: N/A;   HAND SURGERY Right    KNEE ARTHROPLASTY Left    NASAL SEPTUM SURGERY     RADICAL HYSTERECTOMY  2000   RIGHT HEART CATH N/A 05/22/2021   Procedure: RIGHT HEART CATH;  Surgeon: Swaziland, Peter M, MD;  Location: [facility] INVASIVE CV LAB;  Service: Cardiovascular;  Laterality: N/A;   TONSILLECTOMY AND ADENOIDECTOMY     Patient Active Problem List   Diagnosis Date Noted   Guillain Barr syndrome (HCC) 01/16/2023   Coronary artery disease involving native coronary artery of native heart without angina pectoris 08/24/2021   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  Rationale for Evaluation and Treatment: Rehabilitation  SUBJECTIVE:  SUBJECTIVE STATEMENT: Denies falls/near falls. Reports feeling some minor unsteadiness. States that her nerve pain both hands and feet kept her up in the night.   Pt accompanied by: self  PERTINENT HISTORY: dyspnea on exertion (thought to be due to deconditioning)   PAIN:  Are you having pain? No (Only numbness and tingling)  PRECAUTIONS: Fall  WEIGHT BEARING RESTRICTIONS: No  FALLS: Has patient fallen in last 6 months?  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 I would also like to get back to line dancing."   OBJECTIVE:   DIAGNOSTIC FINDINGS:  CT Head wo Contrast:  IMPRESSION: Unremarkable head CT.  No explanation for symptoms.  COGNITION: Overall cognitive status: Within functional limits for tasks assessed    LOWER EXTREMITY ROM:      Grossly WFL  LOWER EXTREMITY MMT:    MMT  Right Eval Left Eval  Hip flexion 4-/5 4-/5  Hip extension    Hip abduction 3+/5 3+/5  Hip adduction 3+/5 3+/5  Hip internal rotation    Hip external rotation    Knee flexion 3+/5 3+/5  Knee extension 4-/5 4-/5  Ankle dorsiflexion 3+/5 3+/5  Ankle plantarflexion    Ankle inversion    Ankle eversion    (Blank rows = not tested)   VITALS:  Vitals:   02/17/23 1424  BP: (!) 149/78  Pulse: 64     TODAY'S TREATMENT:                                                                                                                               TherAct: Discussed pain management strategies including TENS, pool therapy, compression socks, and capsacin patches. Patient to follow up about interest in above options next session for future therapy sessions.  Vitals:   02/17/23 1424  BP: (!) 149/78  Pulse: 64    *** Blaze pods on *** setting for improved ***.  Performed on *** minute intervals with *** rest periods.  Pt requires *** guarding. Round 1:  *** setup.  29 hits. Round 2:  *** setup.  34 hits. Round 3:  *** setup.  41 hits. Notable errors/deficits:  ***  *** Blaze pods on *** setting for improved ***.  Performed on *** minute intervals with *** rest periods.  Pt requires *** guarding. Round 1:  *** setup.  12 hits. Round 2:  *** setup.  13 hits. Round 3:  *** setup.  14 hits. Notable errors/deficits:  ***  PATIENT  EDUCATION: Education details: Initial HEP Person educated: Patient Education method: Explanation and video recommendation Education comprehension: verbalized understanding and needs further education  HOME EXERCISE PROGRAM: Access Code: [email or web address] URL: [email or web address] Date: 02/10/2023 Prepared by: Maryruth Eve  Exercises - Sit to Stand Without Arm Support  - 1 x daily - 5 x weekly - 3 sets - 10 reps - Tandem Walking with Counter Support  - 1 x daily - 5 x weekly - 3 sets - Corner Balance Feet Together With Eyes Closed  - 1 x daily  - 7 x weekly - 3 sets - 30 seconds hold - Corner Balance Feet Together: Eyes Closed With Head Turns  - 1 x daily - 7 x weekly - 3 sets - 10 reps  GOALS: Goals reviewed with patient? Yes  SHORT TERM GOALS: Target date: 03/03/2023  Patient will demonstrate independence with initial HEP to continue to progress between physical therapy sessions.   Baseline: Progressing but patient will benefit from aquatic therapy to help manage LE symptoms Goal status: INITIAL  2.  Functional Gait assessment to be assessed/ LTG goal written Baseline: Assessed on 7/15 Goal status: MET  3.  Patient will improve their 5x Sit to Stand score to less than 15 seconds without UE support to demonstrate a decreased risk for falls and improved LE strength.   Baseline: 17.2 seconds with thigh assist Goal status: INITIAL  LONG TERM GOALS: Target date: 03/31/2023  Patient will report demonstrate independence with final HEP in order to maintain current gains and continue to progress after physical therapy discharge.   Baseline: To be assessed Goal status: INITIAL  2.  Patient will improve ABC Score to 86% or greater to indicate improved self-reported confidence in balance and sense of steadiness.    Baseline: 72% Goal status: INITIAL  3.  Patient will improve gait speed to 1.1 m/s or greater to indicate a reduced risk for falls.   Baseline: 0.93 m/s without AD Goal status: INITIAL  4.  Patient will improve FGA to greater than 22/30 to indicate a decreased risk of falls and improved dynamic stability.    Baseline: 16/30  Goal status: INITIAL  5.  Patient will improve their 5x Sit to Stand score to less than 12 seconds to demonstrate a decreased risk for falls and improved LE strength.   Baseline: 17.2 seconds with thigh assist Goal status: INITIAL  ASSESSMENT:  CLINICAL IMPRESSION: Session emphasized assessment of FGA, discussion of pain management strategies for neuropathic pain, and establishment of initial  HEP. Patient demonstrates increased risk for falls as indicated by FGA. Also provided patient information regarding TENS, compression socks, aquatic therapy, and patches to consider to help minimize pain if physician considered appropriate. Patient tolerated session well. Patient will benefit from skilled PT services in order to progress towards PLOF in order to participate more easily in activities such as line dancing and decrease falls risk. Continue POC.  OBJECTIVE IMPAIRMENTS: Abnormal gait, decreased activity tolerance, decreased balance, decreased endurance, decreased mobility, difficulty walking, decreased strength, impaired sensation, and impaired UE functional use.   ACTIVITY LIMITATIONS: carrying, squatting, transfers, and locomotion level  PARTICIPATION LIMITATIONS: community activity and line dancing  PERSONAL FACTORS: Fitness and 1 comorbidity: see above  are also affecting patient's functional outcome.   REHAB POTENTIAL: Good  CLINICAL DECISION MAKING: Stable/uncomplicated  EVALUATION COMPLEXITY: Low  PLAN:  PT FREQUENCY: 1-2x/week  PT DURATION: 8 weeks  PLANNED INTERVENTIONS: Therapeutic exercises, Therapeutic activity, Neuromuscular re-education, Balance training, Gait training, Patient/Family education, Self  Care, and Re-evaluation  PLAN FOR NEXT SESSION:  progress initial HEP, balance stretching, activities for line dancing, consider ASO for ankle stability as needed, pain management -try TENS, help get scheduled for aquatic therapy 1 x week starting in August will need recert POC   Carmelia Bake, PT, DPT 02/17/2023, 5:14 PM

## 2023-02-18 ENCOUNTER — Ambulatory Visit (INDEPENDENT_AMBULATORY_CARE_PROVIDER_SITE_OTHER): Payer: Medicare Other | Admitting: Adult Health

## 2023-02-18 ENCOUNTER — Encounter (INDEPENDENT_AMBULATORY_CARE_PROVIDER_SITE_OTHER): Payer: Self-pay | Admitting: Adult Health

## 2023-02-18 ENCOUNTER — Encounter: Payer: Self-pay | Admitting: Physical Therapy

## 2023-02-18 VITALS — BP 132/74 | HR 61 | Temp 98.0°F | Ht 64.0 in | Wt 192.0 lb

## 2023-02-18 DIAGNOSIS — I2510 Atherosclerotic heart disease of native coronary artery without angina pectoris: Secondary | ICD-10-CM | POA: Diagnosis not present

## 2023-02-18 DIAGNOSIS — E669 Obesity, unspecified: Secondary | ICD-10-CM | POA: Diagnosis not present

## 2023-02-18 DIAGNOSIS — Z6832 Body mass index (BMI) 32.0-32.9, adult: Secondary | ICD-10-CM | POA: Diagnosis not present

## 2023-02-18 NOTE — Progress Notes (Signed)
Office: [phone]  /  Fax: [phone]   Initial Visit  [name] was seen in clinic today to evaluate for obesity. She is interested in losing weight to improve overall health and reduce the risk of weight related complications. She presents today to review program treatment options, initial physical assessment, and evaluation.     She was referred by: Specialist  When asked what else they would like to accomplish? She states: Adopt healthier eating patterns and Improve energy levels and physical activity  Weight history: Slow. Steady weight gain the last 10 years  When asked how has your weight affected you? She states: Contributed to medical problems, Contributed to orthopedic problems or mobility issues, Having fatigue, and Having poor endurance  Some associated conditions: Hyperlipidemia  Contributing factors: Nutritional, Reduced physical activity, and Eating patterns  Weight promoting medications identified: Psychotropic medications  Current nutrition plan: None  Current level of physical activity: None  Current or previous pharmacotherapy: None  Response to medication: Never tried medications   Past medical history includes:   Past Medical History:  Diagnosis Date   Anxiety    Daytime somnolence 04/06/2013   DDD (degenerative disc disease), lumbar    Depression    Hypercholesteremia    Hypertension    OSA on CPAP 04/06/2013   Sleep apnea    Urinary incontinence      Objective:   BP 132/74   Pulse 61   Temp 98 F (36.7 C)   Ht 5\' 4"  (1.626 m)   Wt 192 lb (87.1 kg)   SpO2 97%   BMI 32.96 kg/m  She was weighed on the bioimpedance scale: Body mass index is 32.96 kg/m.  Peak Weight:202 , Body Fat%:42.7, Visceral Fat Rating:12, Weight trend over the last 12 months: Increasing  General:  Alert, oriented and cooperative. Patient is in no acute distress.  Respiratory: Normal respiratory effort, no problems with respiration noted   Gait: able to  ambulate independently  Mental Status: Normal mood and affect. Normal behavior. Normal judgment and thought content.   DIAGNOSTIC DATA REVIEWED:  BMET    Component Value Date/Time   NA 139 01/02/2023 1200   NA 138 05/09/2021 1011   K 5.0 01/02/2023 1200   CL 106 01/02/2023 1200   CO2 22 01/02/2023 1200   GLUCOSE 152 (H) 01/02/2023 1200   BUN 21 01/02/2023 1200   BUN 16 05/09/2021 1011   CREATININE 0.97 01/02/2023 1200   CREATININE 0.84 12/27/2015 0833   CALCIUM 9.8 01/02/2023 1200   GFRNONAA >60 01/02/2023 1200   GFRAA 38 (L) 09/29/2015 1553   No results found for: "HGBA1C" No results found for: "INSULIN" CBC    Component Value Date/Time   WBC 8.6 01/02/2023 1200   RBC 5.63 (H) 01/02/2023 1200   HGB 15.3 (H) 01/02/2023 1200   HGB 15.4 05/09/2021 1011   HCT 49.5 (H) 01/02/2023 1200   HCT 47.9 (H) 05/09/2021 1011   PLT 244 01/02/2023 1200   PLT 211 05/09/2021 1011   MCV 87.9 01/02/2023 1200   MCV 82 05/09/2021 1011   MCH 27.2 01/02/2023 1200   MCHC 30.9 01/02/2023 1200   RDW 15.0 01/02/2023 1200   RDW 13.7 05/09/2021 1011   Iron/TIBC/Ferritin/ %Sat No results found for: "IRON", "TIBC", "FERRITIN", "IRONPCTSAT" Lipid Panel     Component Value Date/Time   CHOL 130 11/14/2021 0912   TRIG 97 11/14/2021 0912   HDL 55 11/14/2021 0912   CHOLHDL 2.4 11/14/2021 0912   LDLCALC 57  11/14/2021 0912   Hepatic Function Panel     Component Value Date/Time   PROT 7.1 11/14/2021 0912   ALBUMIN 4.4 11/14/2021 0912   AST 21 11/14/2021 0912   ALT 17 11/14/2021 0912   ALKPHOS 50 11/14/2021 0912   BILITOT 0.6 11/14/2021 0912   BILIDIR 0.15 11/14/2021 0912      Component Value Date/Time   TSH 2.070 10/30/2020 1417     Assessment and Plan:   Coronary artery disease involving native coronary artery of native heart without angina pectoris  Obesity (BMI 30-39.9), Starting BMI 32.94  ESTABLISH WITH HWW   Obesity Treatment / Action Plan:  Patient will work on garnering  support from family and friends to begin weight loss journey. Will work on eliminating or reducing the presence of highly palatable, calorie dense foods in the home. Will complete provided nutritional and psychosocial assessment questionnaire before the next appointment. Will be scheduled for indirect calorimetry to determine resting energy expenditure in a fasting state.  This will allow [name] to create a reduced calorie, high-protein meal plan to promote loss of fat mass while preserving muscle mass. Counseled on the health benefits of losing 5%-15% of total body weight. Was counseled on nutritional approaches to weight loss and benefits of reducing processed foods and consuming plant-based foods and high quality protein as part of nutritional weight management. Was counseled on pharmacotherapy and role as an adjunct in weight management.   Obesity Education Performed Today:  She was weighed on the bioimpedance scale and results were discussed and documented in the synopsis.  We discussed obesity as a disease and the importance of a more detailed evaluation of all the factors contributing to the disease.  We discussed the importance of long term lifestyle changes which include nutrition, exercise and behavioral modifications as well as the importance of customizing this to her specific health and social needs.  We discussed the benefits of reaching a healthier weight to alleviate the symptoms of existing conditions and reduce the risks of the biomechanical, metabolic and psychological effects of obesity.  [name] appears to be in the action stage of change and states they are ready to start intensive lifestyle modifications and behavioral modifications.  30 minutes was spent today on this visit including the above counseling, pre-visit chart review, and post-visit documentation.  Reviewed by clinician on day of visit: allergies, medications, problem list, medical history, surgical  history, family history, social history, and previous encounter notes pertinent to obesity diagnosis.   Havanna Groner d. Samoria Fedorko, NP-C

## 2023-02-19 ENCOUNTER — Ambulatory Visit: Payer: Medicare Other | Admitting: Occupational Therapy

## 2023-02-19 DIAGNOSIS — M6281 Muscle weakness (generalized): Secondary | ICD-10-CM

## 2023-02-19 DIAGNOSIS — R278 Other lack of coordination: Secondary | ICD-10-CM | POA: Diagnosis not present

## 2023-02-19 DIAGNOSIS — R208 Other disturbances of skin sensation: Secondary | ICD-10-CM

## 2023-02-19 NOTE — Therapy (Signed)
OUTPATIENT OCCUPATIONAL THERAPY NEURO TREATMENT  Patient Name: [name] MRN: [id number] DOB:May 31, 1956, 67 y.o., female Today's Date: 02/19/2023  PCP: Chilton Greathouse, MD REFERRING PROVIDER: Suanne Marker, MD  END OF SESSION:  OT End of Session - 02/19/23 1101     Visit Number 3    Number of Visits 8   + evaluation   Date for OT Re-Evaluation 04/04/23    Authorization Type MEDICARE PART A AND B    Progress Note Due on Visit 10    OT Start Time 1102    OT Stop Time 1147    OT Time Calculation (min) 45 min    Equipment Utilized During Treatment fluidotherapy    Activity Tolerance Patient tolerated treatment well    Behavior During Therapy [facility] for tasks assessed/performed             Past Medical History:  Diagnosis Date   Anxiety    Daytime somnolence 04/06/2013   DDD (degenerative disc disease), lumbar    Depression    Hypercholesteremia    Hypertension    OSA on CPAP 04/06/2013   Sleep apnea    Urinary incontinence    Past Surgical History:  Procedure Laterality Date   ABLATION  09/29/2017   BACK SURGERY  04/06/2019   BREAST EXCISIONAL BIOPSY Left    CATARACT EXTRACTION Bilateral    ELECTROPHYSIOLOGIC STUDY N/A 01/01/2016   Procedure: SVT Ablation;  Surgeon: Marinus Maw, MD;  Location: [facility] INVASIVE CV LAB;  Service: Cardiovascular;  Laterality: N/A;   HAND SURGERY Right    KNEE ARTHROPLASTY Left    NASAL SEPTUM SURGERY     RADICAL HYSTERECTOMY  2000   RIGHT HEART CATH N/A 05/22/2021   Procedure: RIGHT HEART CATH;  Surgeon: [place], Peter M, MD;  Location: [facility] INVASIVE CV LAB;  Service: Cardiovascular;  Laterality: N/A;   TONSILLECTOMY AND ADENOIDECTOMY     Patient Active Problem List   Diagnosis Date Noted   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   Coronary artery disease involving native coronary artery of native heart without angina pectoris 08/24/2021   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   Elevated coronary artery calcium score  12/06/2020   Mixed hyperlipidemia 10/12/2020   Primary hypertension 01/19/2020   Chronic diastolic (congestive) heart failure (HCC) 01/19/2020   Dyspnea on exertion 11/05/2019   Hiatal hernia 11/05/2019   PSVT (paroxysmal supraventricular tachycardia) 01/01/2016   Thyroid nodule 04/14/2013   OSA on CPAP 04/06/2013   Daytime somnolence 04/06/2013    ONSET DATE: Referral: 01/16/2023 Onset: 12/16/22  REFERRING DIAG: G61.0 (ICD-10-CM) - Guillain Barr syndrome  THERAPY DIAG:  Other disturbances of skin sensation  Other lack of coordination  Muscle weakness (generalized)  Rationale for Evaluation and Treatment: Rehabilitation  SUBJECTIVE:   SUBJECTIVE STATEMENT: Patient reports the tingling and numbness in her hands (just hands, not whole arm) and feet are worse today.  She also expressed concern about managing neuropathy symptoms verus treating/improving them.  She got some info from a friend about a Land that treats neuropathy - https://www.riddle-martin.info/   Pt accompanied by: self  PERTINENT HISTORY:   67 year old female here for evaluation of rapidly progressive ascending numbness and weakness.   Symptoms started around Dec 16, 2022.  Patient noticed a numbness sensation on her left forefoot and toes.  This progressed to the right foot and up her legs.  Within a week she had numbness and tingling and weakness in her hands up to her elbows.  She went to  the emergency room twice for evaluation.  She was diagnosed with neuropathy and was recommended to follow-up with neurology.   Of note patient had preceding upper respiratory infection symptoms Dec 12, 2022.  She also had RSV vaccination earlier in May.   Symptoms have plateaued since 01/04/23.  Using a walker at home.  She feels weakness in her hands, arms, legs.  No breathing issues.  No swallowing issues.  No prior similar symptoms.  PRECAUTIONS: No  WEIGHT BEARING RESTRICTIONS:  No  PAIN:  Are you having pain? Yes: NPRS scale: 6/10 Pain location: both hands and feet Pain description: tingling - pins and needles (both hands and feet) Aggravating factors: potentially activity but hard to determine Relieving factors: Gabapentin  FALLS: Has patient fallen in last 6 months? 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 Grooming: MI UB Dressing: 2 weeks ago - she could not fasten bra LB Dressing: Still feels stiff when putting on socks and is still slower at this task.  Reports she can tie her laces now. Toileting: MI Bathing: Sitting to bath due to being unsteady Tub Shower transfers: MI with walk in shower Equipment: Shower seat with back  IADLs: Shopping: Patient was initailly driven by her husband for about 3 weeks but is now drving and returning to community outings/tasks on her own  Light housekeeping: Can help do her laundry now. Meal Prep: Husband does meal prep (previously did so also) Community mobility: Was using walker until about 6/5 - 01/17/23 - staying at home during that time Medication management: Returning to ability to open pill containers Financial management: Not asked. 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  COORDINATION:  9 Hole Peg test: Right: 23.05  sec; Left: 24.11  sec (Patient does reports task feeling funny and awkward compared to normal).  SENSATION: Light touch: Impaired palmar surfaces are tingling even with different temperatures Diminished from wrist and below bilaterally for UEs palmar surfaces and below ankles   EDEMA: noted fingers being stiff and swollen and L toes  MUSCLE TONE: WFL  COGNITION: Overall cognitive status: Within functional limits for tasks assessed  VISION: Subjective report: H/O cataract removal.  Has different glasses for distance and for reading Baseline vision: Wears glasses for distance only and Wears glasses for reading only Visual history: cataracts  VISION ASSESSMENT: Not tested  Patient reports no difficulty with activities due to vision impairments  PERCEPTION: Not tested  PRAXIS: Not tested  EVALUATION OBSERVATIONS: Patient is a 67 y.o. female who was seen today for occupational therapy evaluation for UE numbness and tingling with resulting weakness and incoordination as a result of Guillain Barre Syndrome. Hx includes recent dx of probable Guillain Barre Syndrome. Patient currently presents below baseline level of functioning demonstrating functional deficits and impairments as noted below. Pt would benefit from skilled OT services in the outpatient setting to work on impairments as noted below to help pt return to PLOF as able.       TODAY'S TREATMENT:                                                                                                                                   Therapeutic Activities:   Pt placed BUE in Fluidotherapy machine with supervised ROM and sensory stimulation x 10 min. Pt was educated to complete AROM during modality time to improve decrease pain/stiffness of affected extremities by use of the machine's massaging action and thermal properties with patient reporting improvement in tingling upon removal of hands from device which last for remainder of session.    Coordination Activities handout with images provided and activities introduced while patient had her hands in fluidotherapy and then completed at table top with B UEs to work on finger ROM, dexterity and isolated movements with demonstration and practice, as well as modification, and cues throughout to improve sensory stimulation also.    Pick up coins, dominoes, buttons, marbles, dried beans/pasta of different sizes ... To place in containers To stack. To pick up items one at a time until she gets 5+ in her hand and then move item from palm to fingertips to release.  -- She is also encouraged to flip items in her finger tips, work with vision occluded and vary objects   Flip cards 1 at a time. -- Setup patient to work on sorting cards for table top card games, focusing on using entire finger verus just numb fingertip with thumb to flip cards.   Twirling pen between fingers. -- Working on isolating fingers individually to move pen in her hand   Tear a piece of paper towel and roll it into small balls. -- Again, with cues and guidance to isolate index and thumb motions.  Rotating balls in hand ie) Chinese balls, golf balls etc   She is encouraged to vary tasks, difficulty of tasks and to incorporate sensory stimulation during different activities throughout the week  PATIENT EDUCATION: Education details: Coordination Activities Person educated: Patient Education method: Explanation, Demonstration,  Tactile cues, Verbal cues, and Handouts Education comprehension: verbalized understanding, returned demonstration, verbal cues required, tactile cues required, and needs further education  HOME EXERCISE PROGRAM: 02/12/23 Putty Exercises: Access Code: [email or web address] 02/19/23 Coordination Activities with Images   GOALS: Goals reviewed with patient? Yes  SHORT TERM GOALS: Target date: 03/07/23  Patient will demonstrate UE strength/coordination HEP with 25% verbal cues or less for proper execution.  Baseline: To be provided Goal status: IN PROGRESS  2.  Patient will demonstrate improved sensory awareness/Stereognosis to identify different items in her purse/pocket and different coins with vision occluded.  Baseline: TBA - Tingling & numbness in B palmar surfaces  Goal status: IN PROGRESS  3.  Patient will improve pinch strength/coordination & sensation of B hands to increase ease & independence in tying shoelaces, and manage buttons with vision occluded Baseline: Self reported difficulty. Goal status: IN PROGRESS   LONG TERM GOALS: Target date: 04/04/23  Patient will demonstrate MI with sensory stimulation HEP with MI  Baseline: To be provided. Goal status: IN PROGRESS  2.  Patient will demonstrate at least 30 lbs R UE grip strength and 25 lbs L UE grip strength as needed to open jars and other containers.  Baseline: Rt 22 lbs; Lt 18 lbs Goal status: IN PROGRESS  3.  Patient will demonstrate UE coordination to resume playing the ukulele  Baseline: Unable to move fingers for different cords by self report. Goal status: IN PROGRESS  ASSESSMENT:  CLINICAL IMPRESSION: Patient is a 67 y.o. female who returned today for her 2nd OT visit s/p evaluation for UE dysfunction due to neuropathy and resulting coordination and strength deficits due to Guillain Barre Syndrome affecting B hands (palmar surfaces especially). She responded well to fluidotherpay today for decreased tingling, at least for  duration of session an is encouraged to monitor for duration of lasting benefits.  She also responded well again today to coordination task recommendations for progression finger/hand strength, dexterity and sensroy awareness. Pt will benefit from continued skilled OT services in the outpatient setting to work on sensory and coordination deficits to help pt return to PLOF as able.     PERFORMANCE DEFICITS: in functional skills including ADLs, coordination, dexterity, sensation, strength, flexibility, Fine motor control, decreased knowledge of precautions, and UE functional use.  IMPAIRMENTS: are limiting patient from ADLs, leisure, and social participation.   CO-MORBIDITIES: may have co-morbidities  that affects occupational performance. Patient will benefit from skilled OT to address above impairments and improve overall function.  REHAB POTENTIAL: Excellent  PLAN:  OT FREQUENCY: 1x/week  OT DURATION: 8 weeks  PLANNED INTERVENTIONS: self care/ADL training, therapeutic exercise, therapeutic activity, neuromuscular re-education, balance training, fluidotherapy, patient/family education, coping strategies training, and DME and/or AE instructions  RECOMMENDED OTHER SERVICES: PT treatments in place s/p eval on same day as OT.  CONSULTED AND AGREED WITH PLAN OF CARE: Patient  PLAN FOR NEXT SESSION:  Review sensory stimulation education and provide safety recommendations. Review putty/coordination HEP ideas.  Modalities for neuropathy ie) trial fluidotherapy again, [name].  Would heat options be effective also for home carryover.  Stereognosis with warmed rice bin   Victorino Sparrow, OT 02/19/2023, 6:58 PM

## 2023-02-25 ENCOUNTER — Ambulatory Visit: Payer: Medicare Other | Admitting: Occupational Therapy

## 2023-02-25 ENCOUNTER — Ambulatory Visit: Payer: Medicare Other | Admitting: Physical Therapy

## 2023-02-25 DIAGNOSIS — R278 Other lack of coordination: Secondary | ICD-10-CM | POA: Diagnosis not present

## 2023-02-25 DIAGNOSIS — M6281 Muscle weakness (generalized): Secondary | ICD-10-CM

## 2023-02-25 DIAGNOSIS — R29818 Other symptoms and signs involving the nervous system: Secondary | ICD-10-CM

## 2023-02-25 DIAGNOSIS — R2681 Unsteadiness on feet: Secondary | ICD-10-CM

## 2023-02-25 DIAGNOSIS — R208 Other disturbances of skin sensation: Secondary | ICD-10-CM

## 2023-02-25 DIAGNOSIS — R2689 Other abnormalities of gait and mobility: Secondary | ICD-10-CM

## 2023-02-25 NOTE — Therapy (Signed)
OUTPATIENT PHYSICAL THERAPY NEURO TREATMENT   Patient Name: [name] MRN: [id number] DOB:21-May-1956, 67 y.o., female Today's Date: 02/25/2023  PCP: Suanne Marker, MD REFERRING PROVIDER: Suanne Marker, MD  END OF SESSION:  PT End of Session - 02/25/23 1359     Visit Number 4    Number of Visits 14    Date for PT Re-Evaluation 04/14/23    Authorization Type Medicare    PT Start Time 1400    PT Stop Time 1442    PT Time Calculation (min) 42 min    Equipment Utilized During Treatment Gait belt    Activity Tolerance Patient tolerated treatment well    Behavior During Therapy WFL for tasks assessed/performed              Past Medical History:  Diagnosis Date   Anxiety    Daytime somnolence 04/06/2013   DDD (degenerative disc disease), lumbar    Depression    Hypercholesteremia    Hypertension    OSA on CPAP 04/06/2013   Sleep apnea    Urinary incontinence    Past Surgical History:  Procedure Laterality Date   ABLATION  09/29/2017   BACK SURGERY  04/06/2019   BREAST EXCISIONAL BIOPSY Left    CATARACT EXTRACTION Bilateral    ELECTROPHYSIOLOGIC STUDY N/A 01/01/2016   Procedure: SVT Ablation;  Surgeon: Marinus Maw, MD;  Location: [facility] INVASIVE CV LAB;  Service: Cardiovascular;  Laterality: N/A;   HAND SURGERY Right    KNEE ARTHROPLASTY Left    NASAL SEPTUM SURGERY     RADICAL HYSTERECTOMY  2000   RIGHT HEART CATH N/A 05/22/2021   Procedure: RIGHT HEART CATH;  Surgeon: Swazil[place], Peter M, MD;  Location: [facility] INVASIVE CV LAB;  Service: Cardiovascular;  Laterality: N/A;   TONSILLECTOMY AND ADENOIDECTOMY     Patient Active Problem List   Diagnosis Date Noted   Guillain Barr syndrome (HCC) 01/16/2023   Coronary artery disease involving native coronary artery of native heart without angina pectoris 08/24/2021   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   Elevated coronary artery calcium score 12/06/2020   Mixed hyperlipidemia 10/12/2020   Primary  hypertension 01/19/2020   Chronic diastolic (congestive) heart failure (HCC) 01/19/2020   Dyspnea on exertion 11/05/2019   Hiatal hernia 11/05/2019   PSVT (paroxysmal supraventricular tachycardia) 01/01/2016   Thyroid nodule 04/14/2013   OSA on CPAP 04/06/2013   Daytime somnolence 04/06/2013    ONSET DATE: 01/16/2023 (referral date)  REFERRING DIAG: G61.0 (ICD-10-CM) - Guillain Barr syndrome (HCC)  THERAPY DIAG:  Muscle weakness (generalized)  Unsteadiness on feet  Other abnormalities of gait and mobility  Other symptoms and signs involving the nervous system  Rationale for Evaluation and Treatment: Rehabilitation  SUBJECTIVE:  SUBJECTIVE STATEMENT: Pt with ongoing nerve pain in hands and feet, toes feel cold at times. Denies any falls or other acute changes.  Pt accompanied by: self  PERTINENT HISTORY: dyspnea on exertion (thought to be due to deconditioning)   PAIN:  Are you having pain? No (Only numbness and tingling)  PRECAUTIONS: Fall  WEIGHT BEARING RESTRICTIONS: No  FALLS: Has patient fallen in last 6 months?  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 I would also like to get back to line dancing."   OBJECTIVE:   DIAGNOSTIC FINDINGS:  CT Head wo Contrast:  IMPRESSION: Unremarkable head CT.  No explanation for  symptoms.  COGNITION: Overall cognitive status: Within functional limits for tasks assessed    LOWER EXTREMITY ROM:      Grossly WFL  LOWER EXTREMITY MMT:    MMT Right Eval Left Eval  Hip flexion 4-/5 4-/5  Hip extension    Hip abduction 3+/5 3+/5  Hip adduction 3+/5 3+/5  Hip internal rotation    Hip external rotation    Knee flexion 3+/5 3+/5  Knee extension 4-/5 4-/5  Ankle dorsiflexion 3+/5 3+/5  Ankle plantarflexion    Ankle inversion    Ankle eversion    (Blank rows = not tested)   VITALS:  There were no vitals filed for this visit.    TODAY'S TREATMENT:                                                                                                                               Electrical Stimulation: Trial of TENS to plantar aspect and dorsal aspect of B feet in attempt to relieve neuropathic pain due to GBS. TENS level 20 to plantar aspect of L foot where sensation is most impaired, poor tolerance with electrical sensation detected at times but otherwise no sensation detected TENS level 20 to dorsum of L foot, mild tingling detected TENS level 22 to plantar aspect of R foot, no sensation detected TENS level 19 to dorsum of R foot, mild tingling detected  Pt overall with fair response to TENS this date, better response on dorsum of feet as compared to plantar aspect of feet, plantar aspect is most impaired regarding sensation so could possibly benefit from future trial on plantar aspect of feet if sensation improves. Pt interested in trialing TENS again on dorsum of feet. Inspected skin following removal of electrodes***   PATIENT EDUCATION: Education details: Initial HEP + pool acquatic therapy*** Person educated: Patient  Education method: Explanation and video recommendation Education comprehension: verbalized understanding and needs further education  HOME EXERCISE PROGRAM: Access Code: [email or web address] URL: [email or web address] Date:  02/10/2023 Prepared by: Maryruth Eve  Exercises - Sit to Stand Without Arm Support  - 1 x daily - 5 x weekly - 3 sets - 10 reps - Tandem Walking with Counter Support  - 1 x daily - 5 x weekly - 3 sets - Corner Balance Feet Together With Eyes Closed  - 1 x daily - 7 x weekly - 3 sets - 30 seconds hold - Corner Balance Feet Together: Eyes Closed With Head Turns  - 1 x daily - 7 x weekly - 3 sets - 10 reps  GOALS: Goals reviewed with patient? Yes  SHORT TERM GOALS: Target date: 03/03/2023  Patient will demonstrate independence with initial HEP to continue to progress between physical therapy sessions.   Baseline: Progressing but patient will benefit from aquatic therapy to help manage LE symptoms Goal status: INITIAL  2.  Functional Gait assessment to be assessed/ LTG goal written Baseline: Assessed on 7/15 Goal status: MET  3.  Patient will improve their 5x Sit to Stand score to less than 15 seconds without UE support to demonstrate a decreased risk for falls and improved LE strength.   Baseline: 17.2 seconds with thigh assist Goal status: INITIAL  LONG TERM GOALS: Target date: 03/31/2023  Patient will report demonstrate independence with final HEP in order to maintain current gains and continue to progress after physical therapy discharge.   Baseline: To be assessed Goal status: INITIAL  2.  Patient will improve ABC Score to 86% or greater to indicate improved self-reported confidence in balance and sense of steadiness.    Baseline: 72% Goal status: INITIAL  3.  Patient will improve gait speed to 1.1 m/s or greater to indicate a reduced risk for falls.   Baseline: 0.93 m/s without AD Goal status: INITIAL  4.  Patient will improve FGA to greater than 22/30 to indicate a decreased risk of falls and improved dynamic stability.    Baseline: 16/30  Goal status: INITIAL  5.  Patient will improve their 5x Sit to Stand score to less than 12 seconds to demonstrate a decreased risk for  falls and improved LE strength.   Baseline: 17.2 seconds with thigh assist Goal status: INITIAL  ASSESSMENT:  CLINICAL IMPRESSION: Emphasis of skilled PT session*** Continue POC.   OBJECTIVE IMPAIRMENTS: Abnormal gait, decreased activity tolerance, decreased balance, decreased endurance, decreased mobility, difficulty walking, decreased strength, impaired sensation, and impaired UE functional use.   ACTIVITY LIMITATIONS: carrying, squatting, transfers, and locomotion level  PARTICIPATION LIMITATIONS: community activity and line dancing  PERSONAL FACTORS: Fitness and 1 comorbidity: see above  are also affecting patient's functional outcome.   REHAB POTENTIAL: Good  CLINICAL DECISION MAKING: Stable/uncomplicated  EVALUATION COMPLEXITY: Low  PLAN:  PT FREQUENCY: 1-2x/week  PT DURATION: 8 weeks  PLANNED INTERVENTIONS: Therapeutic exercises, Therapeutic activity, Neuromuscular re-education, Balance training, Gait training, Patient/Family education, Self Care, Aquatic Therapy, and Re-evaluation  PLAN FOR NEXT SESSION:  progress initial HEP, balance stretching, activities for line dancing, consider ASO for ankle stability as needed, pain management -try TENS, help get scheduled for aquatic therapy 1 x week starting in August in pool and 1x week on land for total of 2 visits a week***   Peter Congo, PT, DPT, CSRS  02/25/2023, 2:46 PM

## 2023-02-25 NOTE — Patient Instructions (Signed)
Home Desensitization Program

## 2023-02-25 NOTE — Therapy (Unsigned)
OUTPATIENT OCCUPATIONAL THERAPY NEURO TREATMENT  Patient Name: DENEAN SAMAND MRN: [id number] DOB:1955/09/26, 67 y.o., female Today's Date: 02/25/2023  PCP: Chilton Greathouse, MD REFERRING PROVIDER: Suanne Marker, MD  END OF SESSION:  OT End of Session - 02/25/23 1319     Visit Number 4    Number of Visits 8   + evaluation   Date for OT Re-Evaluation 04/04/23    Authorization Type MEDICARE PART A AND B    Progress Note Due on Visit 10    OT Start Time 1319    OT Stop Time 1359    OT Time Calculation (min) 40 min    Equipment Utilized During Treatment Putty/FM materials    Activity Tolerance Patient tolerated treatment well    Behavior During Therapy [facility] for tasks assessed/performed             Past Medical History:  Diagnosis Date   Anxiety    Daytime somnolence 04/06/2013   DDD (degenerative disc disease), lumbar    Depression    Hypercholesteremia    Hypertension    OSA on CPAP 04/06/2013   Sleep apnea    Urinary incontinence    Past Surgical History:  Procedure Laterality Date   ABLATION  09/29/2017   BACK SURGERY  04/06/2019   BREAST EXCISIONAL BIOPSY Left    CATARACT EXTRACTION Bilateral    ELECTROPHYSIOLOGIC STUDY N/A 01/01/2016   Procedure: SVT Ablation;  Surgeon: Marinus Maw, MD;  Location: [facility] INVASIVE CV LAB;  Service: Cardiovascular;  Laterality: N/A;   HAND SURGERY Right    KNEE ARTHROPLASTY Left    NASAL SEPTUM SURGERY     RADICAL HYSTERECTOMY  2000   RIGHT HEART CATH N/A 05/22/2021   Procedure: RIGHT HEART CATH;  Surgeon: Swazil[place], Peter M, MD;  Location: [facility] INVASIVE CV LAB;  Service: Cardiovascular;  Laterality: N/A;   TONSILLECTOMY AND ADENOIDECTOMY     Patient Active Problem List   Diagnosis Date Noted   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   Coronary artery disease involving native coronary artery of native heart without angina pectoris 08/24/2021   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   Elevated coronary artery calcium  score 12/06/2020   Mixed hyperlipidemia 10/12/2020   Primary hypertension 01/19/2020   Chronic diastolic (congestive) heart failure (HCC) 01/19/2020   Dyspnea on exertion 11/05/2019   Hiatal hernia 11/05/2019   PSVT (paroxysmal supraventricular tachycardia) 01/01/2016   Thyroid nodule 04/14/2013   OSA on CPAP 04/06/2013   Daytime somnolence 04/06/2013    ONSET DATE: Referral: 01/16/2023 Onset: 12/16/22  REFERRING DIAG: G61.0 (ICD-10-CM) - Guillain Barr syndrome  THERAPY DIAG:  Other lack of coordination  Other disturbances of skin sensation  Other symptoms and signs involving the nervous system  Muscle weakness (generalized)  Rationale for Evaluation and Treatment: Rehabilitation  SUBJECTIVE:   SUBJECTIVE STATEMENT: Patient reports the tingling and numbness in her hands (just hands, not whole arm) and feet are worse today.  Hands and feet get cold   Wearing compression     Her friend had a container of rice but used bean s at home    Pt accompanied by: self  PERTINENT HISTORY:   67 year old female here for evaluation of rapidly progressive ascending numbness and weakness.   Symptoms started around Dec 16, 2022.  Patient noticed a numbness sensation on her left forefoot and toes.  This progressed to the right foot and up her legs.  Within a week she had numbness and tingling and weakness in  her hands up to her elbows.  She went to the emergency room twice for evaluation.  She was diagnosed with neuropathy and was recommended to follow-up with neurology.   Of note patient had preceding upper respiratory infection symptoms Dec 12, 2022.  She also had RSV vaccination earlier in May.   Symptoms have plateaued since 01/04/23.  Using a walker at home.  She feels weakness in her hands, arms, legs.  No breathing issues.  No swallowing issues.  No prior similar symptoms.  PRECAUTIONS: No  WEIGHT BEARING RESTRICTIONS: No  PAIN:  Are you having pain? Yes: NPRS scale:  8-9/10 Pain location: both hands/feet are worse Pain description: tingling - pins and needles (both hands and feet) Aggravating factors: potentially activity but hard to determine Relieving factors: Gabapentin  FALLS: Has patient fallen in last 6 months? 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 Grooming: MI UB Dressing: 2 weeks ago - she could not fasten bra LB Dressing: Still feels stiff when putting on socks and is still slower at this task.  Reports she can tie her laces now. Toileting: MI Bathing: Sitting to bath due to being unsteady Tub Shower transfers: MI with walk in shower Equipment: Shower seat with back  IADLs: Shopping: Patient was initailly driven by her husband for about 3 weeks but is now drving and returning to community outings/tasks on her own  Light housekeeping: Can help do her laundry now. Meal Prep: Husband does meal prep (previously did so also) Community mobility: Was using walker until about 6/5 - 01/17/23 - staying at home during that time Medication management: Returning to ability to open pill containers Financial management: Not asked. 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  COORDINATION:  9 Hole Peg test: Right: 23.05  sec; Left: 24.11  sec (Patient does reports task feeling funny and awkward compared to normal).  SENSATION: Light touch: Impaired palmar surfaces are tingling even with different temperatures Diminished from wrist and below bilaterally for UEs palmar surfaces and below ankles   EDEMA: noted fingers being stiff and swollen and L toes  MUSCLE TONE: WFL  COGNITION: Overall cognitive status: Within functional limits for tasks assessed  VISION: Subjective report: H/O cataract removal.  Has different glasses for distance and for reading Baseline vision: Wears glasses for distance only  and Wears glasses for reading only Visual history: cataracts  VISION ASSESSMENT: Not tested  Patient reports no difficulty with activities due to vision impairments  PERCEPTION: Not tested  PRAXIS: Not tested  EVALUATION OBSERVATIONS: Patient is a 67 y.o. female who was seen today for occupational therapy evaluation for UE numbness and tingling with resulting weakness and incoordination as a result of Guillain Barre Syndrome. Hx includes recent dx of probable Guillain Barre Syndrome. Patient currently presents below baseline level of functioning demonstrating functional deficits and impairments as noted below. Pt would benefit from skilled OT services in the outpatient setting to work on impairments as noted below to help pt return to PLOF as able.       TODAY'S TREATMENT:                                                                                                                                   Therapeutic Activities:   Pt placed BUE in Fluidotherapy machine with supervised ROM and sensory stimulation x 10 min. Pt was educated to complete AROM during modality time to improve decrease pain/stiffness of affected extremities by use of the machine's massaging action and thermal properties with patient reporting improvement in tingling upon removal of hands from device which last for remainder of session.    Coordination Activities handout with images provided and activities introduced while patient had her hands in fluidotherapy and then completed at table top with B UEs to work on finger ROM, dexterity and isolated movements with demonstration and practice, as well as modification, and cues throughout to improve sensory stimulation also.    Pick up coins, dominoes, buttons, marbles, dried beans/pasta of different sizes ... To place in containers To stack. To pick up items one at a time until she gets 5+ in her hand and then move item from palm to fingertips to release.  -- She is also encouraged to flip items in her finger tips, work with vision occluded and vary objects   Flip cards 1 at a time. -- Setup patient to work on sorting cards for table top card games, focusing on using entire finger verus just numb fingertip with thumb to flip cards.   Twirling pen between fingers. -- Working on isolating fingers individually to move pen in her hand   Tear a piece of paper towel and roll it into small balls. -- Again, with cues and guidance to isolate index and thumb motions.  Rotating balls in hand ie) Chinese balls, golf balls etc   She is encouraged to vary tasks, difficulty of tasks and to incorporate sensory stimulation during different activities throughout the week  PATIENT EDUCATION: Education details: Coordination Activities Person educated: Patient Education method: Explanation, Demonstration, Tactile cues, Verbal cues, and Handouts Education  comprehension: verbalized understanding, returned demonstration, verbal cues required, tactile cues required, and needs further education  HOME EXERCISE PROGRAM: 02/12/23 Putty Exercises: Access Code: [email or web address] 02/19/23 Coordination Activities  with Images   GOALS: Goals reviewed with patient? Yes  SHORT TERM GOALS: Target date: 03/07/23  Patient will demonstrate UE strength/coordination HEP with 25% verbal cues or less for proper execution.  Baseline: To be provided Goal status: IN PROGRESS  2.  Patient will demonstrate improved sensory awareness/Stereognosis to identify different items in her purse/pocket and different coins with vision occluded.  Baseline: TBA - Tingling & numbness in B palmar surfaces  Goal status: IN PROGRESS  3.  Patient will improve pinch strength/coordination & sensation of B hands to increase ease & independence in tying shoelaces, and manage buttons with vision occluded Baseline: Self reported difficulty. Goal status: IN PROGRESS   LONG TERM GOALS: Target date: 04/04/23  Patient will demonstrate MI with sensory stimulation HEP with MI  Baseline: To be provided. Goal status: IN PROGRESS  2.  Patient will demonstrate at least 30 lbs R UE grip strength and 25 lbs L UE grip strength as needed to open jars and other containers.  Baseline: Rt 22 lbs; Lt 18 lbs Goal status: IN PROGRESS  3.  Patient will demonstrate UE coordination to resume playing the ukulele  Baseline: Unable to move fingers for different cords by self report. Goal status: IN PROGRESS  ASSESSMENT:  CLINICAL IMPRESSION: Patient is a 67 y.o. female who returned today for her 2nd OT visit s/p evaluation for UE dysfunction due to neuropathy and resulting coordination and strength deficits due to Guillain Barre Syndrome affecting B hands (palmar surfaces especially). She responded well to fluidotherpay today for decreased tingling, at least for duration of session an is encouraged to monitor for  duration of lasting benefits.  She also responded well again today to coordination task recommendations for progression finger/hand strength, dexterity and sensroy awareness. Pt will benefit from continued skilled OT services in the outpatient setting to work on sensory and coordination deficits to help pt return to PLOF as able.     PERFORMANCE DEFICITS: in functional skills including ADLs, coordination, dexterity, sensation, strength, flexibility, Fine motor control, decreased knowledge of precautions, and UE functional use.  IMPAIRMENTS: are limiting patient from ADLs, leisure, and social participation.   CO-MORBIDITIES: may have co-morbidities  that affects occupational performance. Patient will benefit from skilled OT to address above impairments and improve overall function.  REHAB POTENTIAL: Excellent  PLAN:  OT FREQUENCY: 1x/week  OT DURATION: 8 weeks  PLANNED INTERVENTIONS: self care/ADL training, therapeutic exercise, therapeutic activity, neuromuscular re-education, balance training, fluidotherapy, patient/family education, coping strategies training, and DME and/or AE instructions  RECOMMENDED OTHER SERVICES: PT treatments in place s/p eval on same day as OT.  CONSULTED AND AGREED WITH PLAN OF CARE: Patient  PLAN FOR NEXT SESSION:  Review sensory stimulation education and provide safety recommendations. Review putty/coordination HEP ideas.  Modalities for neuropathy ie) trial fluidotherapy again, [name].  Would heat options be effective also for home carryover.  Stereognosis with warmed rice bin   Victorino Sparrow, OT 02/25/2023, 5:31 PM

## 2023-02-26 ENCOUNTER — Ambulatory Visit: Payer: Medicare Other | Admitting: Physical Therapy

## 2023-02-26 ENCOUNTER — Encounter: Payer: Medicare Other | Admitting: Occupational Therapy

## 2023-03-04 ENCOUNTER — Encounter: Payer: Self-pay | Admitting: Physical Therapy

## 2023-03-04 ENCOUNTER — Ambulatory Visit: Payer: Medicare Other | Admitting: Occupational Therapy

## 2023-03-04 ENCOUNTER — Ambulatory Visit: Payer: Medicare Other | Attending: Diagnostic Neuroimaging | Admitting: Physical Therapy

## 2023-03-04 VITALS — BP 134/76 | HR 61

## 2023-03-04 DIAGNOSIS — R208 Other disturbances of skin sensation: Secondary | ICD-10-CM | POA: Insufficient documentation

## 2023-03-04 DIAGNOSIS — R29818 Other symptoms and signs involving the nervous system: Secondary | ICD-10-CM | POA: Diagnosis present

## 2023-03-04 DIAGNOSIS — M6281 Muscle weakness (generalized): Secondary | ICD-10-CM | POA: Insufficient documentation

## 2023-03-04 DIAGNOSIS — R278 Other lack of coordination: Secondary | ICD-10-CM | POA: Insufficient documentation

## 2023-03-04 DIAGNOSIS — R2681 Unsteadiness on feet: Secondary | ICD-10-CM | POA: Diagnosis present

## 2023-03-04 DIAGNOSIS — R2689 Other abnormalities of gait and mobility: Secondary | ICD-10-CM | POA: Diagnosis present

## 2023-03-04 NOTE — Therapy (Signed)
OUTPATIENT PHYSICAL THERAPY NEURO TREATMENT   Patient Name: [name] MRN: [id number] DOB:November 17, 1955, 67 y.o., female Today's Date: 03/04/2023  PCP: Suanne Marker, MD REFERRING PROVIDER: Suanne Marker, MD  END OF SESSION:  PT End of Session - 03/04/23 1020     Visit Number 5    Number of Visits 14    Date for PT Re-Evaluation 04/14/23    Authorization Type Medicare    PT Start Time 1019    PT Stop Time 1102    PT Time Calculation (min) 43 min    Equipment Utilized During Treatment Gait belt    Activity Tolerance Patient tolerated treatment well    Behavior During Therapy WFL for tasks assessed/performed              Past Medical History:  Diagnosis Date   Anxiety    Daytime somnolence 04/06/2013   DDD (degenerative disc disease), lumbar    Depression    Hypercholesteremia    Hypertension    OSA on CPAP 04/06/2013   Sleep apnea    Urinary incontinence    Past Surgical History:  Procedure Laterality Date   ABLATION  09/29/2017   BACK SURGERY  04/06/2019   BREAST EXCISIONAL BIOPSY Left    CATARACT EXTRACTION Bilateral    ELECTROPHYSIOLOGIC STUDY N/A 01/01/2016   Procedure: SVT Ablation;  Surgeon: Marinus Maw, MD;  Location: [facility] INVASIVE CV LAB;  Service: Cardiovascular;  Laterality: N/A;   HAND SURGERY Right    KNEE ARTHROPLASTY Left    NASAL SEPTUM SURGERY     RADICAL HYSTERECTOMY  2000   RIGHT HEART CATH N/A 05/22/2021   Procedure: RIGHT HEART CATH;  Surgeon: Swazil[place], Peter M, MD;  Location: [facility] INVASIVE CV LAB;  Service: Cardiovascular;  Laterality: N/A;   TONSILLECTOMY AND ADENOIDECTOMY     Patient Active Problem List   Diagnosis Date Noted   Guillain Barr syndrome (HCC) 01/16/2023   Coronary artery disease involving native coronary artery of native heart without angina pectoris 08/24/2021   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   Elevated coronary artery calcium score 12/06/2020   Mixed hyperlipidemia 10/12/2020   Primary  hypertension 01/19/2020   Chronic diastolic (congestive) heart failure (HCC) 01/19/2020   Dyspnea on exertion 11/05/2019   Hiatal hernia 11/05/2019   PSVT (paroxysmal supraventricular tachycardia) 01/01/2016   Thyroid nodule 04/14/2013   OSA on CPAP 04/06/2013   Daytime somnolence 04/06/2013    ONSET DATE: 01/16/2023 (referral date)  REFERRING DIAG: G61.0 (ICD-10-CM) - Guillain Barr syndrome (HCC)  THERAPY DIAG:  Muscle weakness (generalized)  Unsteadiness on feet  Other abnormalities of gait and mobility  Rationale for Evaluation and Treatment: Rehabilitation  SUBJECTIVE:  SUBJECTIVE STATEMENT: Pt reports ongoing nerve pain in feet. Patient reports that exercises are going okay at home. Patient is wanting to hold on TENS for now. Patient got compression socks and reports that she feels like they might help a little but no lasting report when removed. Denies any falls or other acute changes.  Pt accompanied by: self  PERTINENT HISTORY: dyspnea on exertion (thought to be due to deconditioning)   PAIN:  Are you having pain? No (Only numbness and tingling)  PRECAUTIONS: Fall  WEIGHT BEARING RESTRICTIONS: No  FALLS: Has patient fallen in last 6 months?  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 I would also like to get back to line  dancing."   OBJECTIVE:   DIAGNOSTIC FINDINGS:  CT Head wo Contrast:  IMPRESSION: Unremarkable head CT.  No explanation for symptoms.  COGNITION: Overall cognitive status: Within functional limits for tasks assessed    LOWER EXTREMITY ROM:      Grossly WFL  LOWER EXTREMITY MMT:    MMT Right Eval Left Eval  Hip flexion 4-/5 4-/5  Hip extension    Hip abduction 3+/5 3+/5  Hip adduction 3+/5 3+/5  Hip internal rotation    Hip external rotation    Knee flexion 3+/5 3+/5  Knee extension 4-/5 4-/5  Ankle dorsiflexion 3+/5 3+/5  Ankle plantarflexion    Ankle inversion    Ankle eversion    (Blank rows = not tested)   VITALS:  Vitals:   03/04/23 1028  BP: 134/76  Pulse: 61    TODAY'S TREATMENT:                                                                                                                               NMR: Blue tandem balance beam walking (CGA) 6 x 8 feet regular Blue tandem balance beam walking with lateral cone taps to target (CGA) 6 x 8 feet regular  TherAct: Donned L ASO for remainder of session (would recommend bilateral if patient was to get) performed tandem balance beam 2x more with CGA; provided printout for patient regarding what type of brace to get  Copperknob line dances x 15 min for sequencing (SBA) no major signs of instability, progressed difficulty of dances  PATIENT EDUCATION: Education details: continue HEP, instruction for pool therapy Person educated: Patient Education method: Explanation Education comprehension: verbalized understanding and needs further education  HOME EXERCISE PROGRAM: Access Code: [email or web address] URL: [email or web address] Date: 02/10/2023 Prepared by: Maryruth Eve  Exercises - Sit to Stand Without Arm Support  - 1 x daily - 5 x weekly - 3 sets - 10 reps - Tandem Walking with Counter Support  - 1 x daily - 5 x weekly - 3 sets - Corner Balance Feet Together With Eyes Closed  - 1 x daily - 7 x  weekly - 3 sets - 30 seconds hold - Corner Balance Feet Together: Eyes Closed With Head Turns  - 1 x daily - 7 x weekly - 3 sets - 10 reps  GOALS: Goals reviewed with patient? Yes  SHORT TERM GOALS: Target date: 03/03/2023  Patient will demonstrate independence with initial HEP to continue to progress between physical therapy sessions.   Baseline: Progressing but patient will benefit from aquatic therapy to help manage LE symptoms Goal status: INITIAL  2.  Functional Gait assessment to be assessed/ LTG goal written Baseline: Assessed on 7/15 Goal status: MET  3.  Patient will improve their 5x Sit to Stand score to less than 15 seconds without UE support to demonstrate a decreased risk for falls and improved LE strength.   Baseline: 17.2 seconds with thigh assist Goal status: INITIAL  LONG TERM GOALS: Target date: 03/31/2023  Patient will report demonstrate independence with final HEP in order to maintain current gains and continue to progress after physical therapy discharge.   Baseline: To be assessed Goal status: INITIAL  2.  Patient will improve ABC Score to 86% or greater to indicate improved self-reported confidence in balance and sense of steadiness.    Baseline: 72% Goal status: INITIAL  3.  Patient will improve gait speed to 1.1 m/s or greater to indicate a reduced risk for falls.   Baseline: 0.93 m/s without AD Goal status: INITIAL  4.  Patient will improve FGA to greater than 22/30 to indicate a decreased risk of falls and improved dynamic stability.    Baseline: 16/30  Goal status: INITIAL  5.  Patient will improve their 5x Sit to Stand score to less than 12 seconds to demonstrate a decreased risk for falls and improved LE strength.   Baseline: 17.2 seconds with thigh assist Goal status: INITIAL  ASSESSMENT:  CLINICAL IMPRESSION:  Emphasis of skilled PT session on NBOS on compliant surface for ankle stablity, ASO trial, and Copperknob dancing for patient to  progress towards PLOF. Patient ankle stability over all good during session; provided patient info on ASO if needed for more stability for higher level dancing tasks. Recommend patient bringing dance shoes to trial in session to also assess stability in these. Patient to start two session of pool therapy to continue to progress strength/balance and work on neuropathic pain management. Continue POC.   OBJECTIVE IMPAIRMENTS: Abnormal gait, decreased activity tolerance, decreased balance, decreased endurance, decreased mobility, difficulty walking, decreased strength, impaired sensation, and impaired UE functional use.   ACTIVITY LIMITATIONS: carrying, squatting, transfers, and locomotion level  PARTICIPATION LIMITATIONS: community activity and line dancing  PERSONAL FACTORS: Fitness and 1 comorbidity: see above  are also affecting patient's functional outcome.   REHAB POTENTIAL: Good  CLINICAL DECISION MAKING: Stable/uncomplicated  EVALUATION COMPLEXITY: Low  PLAN:  PT FREQUENCY: 1-2x/week  PT DURATION: 8 weeks  PLANNED INTERVENTIONS: Therapeutic exercises, Therapeutic activity, Neuromuscular re-education, Balance training, Gait training, Patient/Family education, Self Care, Aquatic Therapy, and Re-evaluation  PLAN FOR NEXT SESSION:  progress initial HEP, balance stretching, activities for line dancing, consider ASO for ankle stability as needed, pain management for neuropathy, work on postural control  Aquatic: line  dance, tai chi like exercises, pain for neuropathy, postural work   Maryruth Eve, PT, DPT  03/04/2023, 12:01 PM

## 2023-03-04 NOTE — Patient Instructions (Signed)
Progression of Putty Activities  Access Code: [email or web address] URL: [email or web address] Date: 03/04/2023 Prepared by: Amada Kingfisher  Exercises - Finger Strengthening: Burnetta Sabin and Kae Heller in Putty  - 1 x daily - 10 reps - Seated Finger Adduction with Putty  - 1 x daily - 10 reps - Seated Finger Extension with Putty  - 1 x daily - 10 reps

## 2023-03-04 NOTE — Therapy (Unsigned)
OUTPATIENT OCCUPATIONAL THERAPY NEURO TREATMENT  Patient Name: [name] MRN: [id number] DOB:04-Mar-1956, 67 y.o., female Today's Date: 03/04/2023  PCP: Chilton Greathouse, MD REFERRING PROVIDER: Suanne Marker, MD  END OF SESSION:  OT End of Session - 03/04/23 1107     Visit Number 5    Number of Visits 8   + evaluation   Date for OT Re-Evaluation 04/04/23    Authorization Type MEDICARE PART A AND B    Progress Note Due on Visit 10    OT Start Time 1105    OT Stop Time 1150    OT Time Calculation (min) 45 min    Equipment Utilized During Treatment Fluidotherapy, therapy putty    Activity Tolerance Patient tolerated treatment well    Behavior During Therapy [facility] for tasks assessed/performed             Past Medical History:  Diagnosis Date   Anxiety    Daytime somnolence 04/06/2013   DDD (degenerative disc disease), lumbar    Depression    Hypercholesteremia    Hypertension    OSA on CPAP 04/06/2013   Sleep apnea    Urinary incontinence    Past Surgical History:  Procedure Laterality Date   ABLATION  09/29/2017   BACK SURGERY  04/06/2019   BREAST EXCISIONAL BIOPSY Left    CATARACT EXTRACTION Bilateral    ELECTROPHYSIOLOGIC STUDY N/A 01/01/2016   Procedure: SVT Ablation;  Surgeon: Marinus Maw, MD;  Location: [facility] INVASIVE CV LAB;  Service: Cardiovascular;  Laterality: N/A;   HAND SURGERY Right    KNEE ARTHROPLASTY Left    NASAL SEPTUM SURGERY     RADICAL HYSTERECTOMY  2000   RIGHT HEART CATH N/A 05/22/2021   Procedure: RIGHT HEART CATH;  Surgeon: Swazil[place], Peter M, MD;  Location: [facility] INVASIVE CV LAB;  Service: Cardiovascular;  Laterality: N/A;   TONSILLECTOMY AND ADENOIDECTOMY     Patient Active Problem List   Diagnosis Date Noted   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   Coronary artery disease involving native coronary artery of native heart without angina pectoris 08/24/2021   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   Elevated coronary artery  calcium score 12/06/2020   Mixed hyperlipidemia 10/12/2020   Primary hypertension 01/19/2020   Chronic diastolic (congestive) heart failure (HCC) 01/19/2020   Dyspnea on exertion 11/05/2019   Hiatal hernia 11/05/2019   PSVT (paroxysmal supraventricular tachycardia) 01/01/2016   Thyroid nodule 04/14/2013   OSA on CPAP 04/06/2013   Daytime somnolence 04/06/2013    ONSET DATE: Referral: 01/16/2023 Onset: 12/16/22  REFERRING DIAG: G61.0 (ICD-10-CM) - Guillain Barr syndrome  THERAPY DIAG:  Other disturbances of skin sensation  Other symptoms and signs involving the nervous system  Other lack of coordination  Muscle weakness (generalized)  Rationale for Evaluation and Treatment: Rehabilitation  SUBJECTIVE:   SUBJECTIVE STATEMENT:  Patient reports her 2.5 day workshop for playing the ukelele went well and it seems like it improved her sensory issues for a short but  the tingling and numbness did come back.  However the return of the sensation was less ie) not at 8 (per PLOF) but down to 6/10.  She also used her putty and NeeDoh gum drop. She was amazed that playing that much helped to decreased the tingling she has been feeling.  She reported that the most benefit for her symptoms has been from the use of the fluidotherapy in therapy, so much so, that she wished she could try it on her feet.  Pt accompanied by: self  PERTINENT HISTORY:   MD report: 67 year old female evaluated for rapidly progressive ascending numbness and weakness.   Symptoms started around Dec 16, 2022.  Patient noticed a numbness sensation on her left forefoot and toes.  This progressed to the right foot and up her legs.  Within a week she had numbness and tingling and weakness in her hands up to her elbows.  She went to the emergency room twice for evaluation.  She was diagnosed with neuropathy and was recommended to follow-up with neurology.   Of note patient had preceding upper respiratory infection  symptoms Dec 12, 2022.  She also had RSV vaccination earlier in May.   Symptoms have plateaued since 01/04/23.  Using a walker at home.  She feels weakness in her hands, arms, legs.  No breathing issues.  No swallowing issues.  No prior similar symptoms.  PRECAUTIONS: No  WEIGHT BEARING RESTRICTIONS: No  PAIN:  Are you having pain? Yes: NPRS scale: 6/10 Pain location: both hands/feet are worse Pain description: tingling - pins and needles (both hands and feet) Aggravating factors:  hard to determine Relieving factors: Gabapentin  FALLS: Has patient fallen in last 6 months? 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 Grooming: MI UB Dressing: 2 weeks ago - she could not fasten bra LB Dressing: Still feels stiff when putting on socks and is still slower at this task.  Reports she can tie her laces now. Toileting: MI Bathing: Sitting to bath due to being unsteady Tub Shower transfers: MI with walk in shower Equipment: Shower seat with back  IADLs: Shopping: Patient was initailly driven by her husband for about 3 weeks but is now drving and returning to community outings/tasks on her own  Light housekeeping: Can help do her laundry now. Meal Prep: Husband does  meal prep (previously did so also) Community mobility: Was using walker until about 6/5 - 01/17/23 - staying at home during that time Medication management: Returning to ability to open pill containers Financial management: Not asked. 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  COORDINATION:  9 Hole Peg test: Right: 23.05  sec; Left: 24.11  sec (Patient does reports task feeling funny and awkward compared to normal).  SENSATION: Light touch: Impaired palmar surfaces are tingling even with different temperatures Diminished  from wrist and below bilaterally for UEs palmar surfaces and below ankles   EDEMA: noted fingers being stiff and swollen and L toes  MUSCLE TONE: WFL  COGNITION: Overall cognitive status: Within functional limits for tasks assessed  VISION: Subjective report: H/O cataract removal.  Has different glasses for distance and for reading Baseline vision: Wears glasses for distance only and Wears glasses for reading only Visual history: cataracts  VISION ASSESSMENT: Not tested  Patient reports no difficulty with activities due to vision impairments  PERCEPTION: Not tested  PRAXIS: Not tested  EVALUATION OBSERVATIONS: Patient is a 67 y.o. female who was seen today for occupational therapy evaluation for UE numbness and tingling with resulting weakness and incoordination as a result of Guillain Barre Syndrome. Hx includes recent dx of probable Guillain Barre Syndrome. Patient currently presents below baseline level of functioning  demonstrating functional deficits and impairments as noted below. Pt would benefit from skilled OT services in the outpatient setting to work on impairments as noted below to help pt return to PLOF as able.       TODAY'S TREATMENT:                                                                                                                                  Therapeutic Exercises:   Pt placed BUE in Fluidotherapy machine with supervised ROM and sensory stimulation x 10 min. Pt was educated to complete gentle AROM during modality time to improve ROM and decrease pain/stiffness, tingling and numbness of hands by use of the machine's massaging action and thermal properties.   Progression of Putty Activities with more resisted putty (pink compared to previous yellow)   Exercises 1+ x daily - 10 reps - Finger Strengthening: [employer] in Putty  -  - Seated Finger Adduction with Putty  - Seated Finger Extension with Putty   Therapeutic Activities:   Patient encouraged to continue sensory stimulation and progress sensory input to hands/fingers with stiffer putty and ongoing use of various textures, objects and activities as it seemed like the [employer] helped with her sensory symptoms.  Encourage to consider benefits of distraction along with sensory stimulation and patient will be trying aquatic therapy with PT soon.    PATIENT EDUCATION: Education details: Progression of putty exercises and upgraded putty to pink Person educated: Patient Education method: Explanation, Demonstration, Tactile cues, Verbal cues, and Handouts Education comprehension: verbalized understanding, returned demonstration, verbal cues required, tactile cues required, and needs further education  HOME EXERCISE PROGRAM: 02/12/23 Putty Exercises: Access Code: [email or web address] 02/19/23 Coordination Activities with Images 02/25/23 Desensitization Program handout 03/04/23 Progression of Putty Exercises: Access Code:  [email or web address]   GOALS: Goals reviewed with patient? Yes  SHORT TERM GOALS: Target date: 03/07/23  Patient will demonstrate UE strength/coordination HEP with 25% verbal cues or less for proper execution.  Baseline: To be provided Goal status: IN PROGRESS  2.  Patient will demonstrate improved  sensory awareness/Stereognosis to identify different items in her purse/pocket and different coins with vision occluded.  Baseline: TBA - Tingling & numbness in B palmar surfaces  Goal status: IN PROGRESS  3.  Patient will improve pinch strength/coordination & sensation of B hands to increase ease & independence in tying shoelaces, and manage buttons with vision occluded Baseline: Self reported difficulty. Goal status: IN PROGRESS   LONG TERM GOALS: Target date: 04/04/23  Patient will demonstrate MI with sensory stimulation HEP with MI  Baseline: To be provided. Goal status: IN PROGRESS  2.  Patient will demonstrate at least 30 lbs R UE grip strength and 25 lbs L UE grip strength as needed to open jars and other containers.  Baseline: Rt 22 lbs; Lt 18 lbs Goal status: IN PROGRESS  3.  Patient will demonstrate UE coordination to resume playing the ukulele  Baseline: Unable to move fingers for different cords by self report. Goal status: IN PROGRESS  ASSESSMENT:  CLINICAL IMPRESSION: Patient is a 67 y.o. female who returned today for OT today due to UE dysfunction due to neuropathy and resulting coordination and strength deficits due to Guillain Barre Syndrome affecting B hands (palmar surfaces especially). She continues to work on various sensory stimulation activities at home with more intensive FM activities completed this weekend with positive results.  Pt will benefit from continued skilled OT services in the outpatient setting to work on sensory and coordination deficits to help pt return to PLOF as able.     PERFORMANCE DEFICITS: in functional skills including ADLs, coordination, dexterity,  sensation, strength, flexibility, Fine motor control, decreased knowledge of precautions, and UE functional use.  IMPAIRMENTS: are limiting patient from ADLs, leisure, and social participation.   CO-MORBIDITIES: may have co-morbidities  that affects occupational performance. Patient will benefit from skilled OT to address above impairments and improve overall function.  REHAB POTENTIAL: Excellent  PLAN:  OT FREQUENCY: 1x/week  OT DURATION: 8 weeks  PLANNED INTERVENTIONS: self care/ADL training, therapeutic exercise, therapeutic activity, neuromuscular re-education, balance training, fluidotherapy, patient/family education, coping strategies training, and DME and/or AE instructions  RECOMMENDED OTHER SERVICES: PT treatments in place s/p eval on same day as OT.  CONSULTED AND AGREED WITH PLAN OF CARE: Patient  PLAN FOR NEXT SESSION:   Review sensory stimulation education and provide safety recommendations.  Review/progress putty/coordination HEP ideas.  Modalities for neuropathy ie) trial fluidotherapy vs TENS/etim, [name].  Would heat options be effective also for home carryover.  Stereognosis with warmed rice bin   Victorino Sparrow, OT 03/04/2023, 12:31 PM

## 2023-03-06 ENCOUNTER — Ambulatory Visit: Payer: Medicare Other | Admitting: Physical Therapy

## 2023-03-06 DIAGNOSIS — R278 Other lack of coordination: Secondary | ICD-10-CM

## 2023-03-06 DIAGNOSIS — R2681 Unsteadiness on feet: Secondary | ICD-10-CM

## 2023-03-06 DIAGNOSIS — R29818 Other symptoms and signs involving the nervous system: Secondary | ICD-10-CM

## 2023-03-06 DIAGNOSIS — M6281 Muscle weakness (generalized): Secondary | ICD-10-CM

## 2023-03-06 DIAGNOSIS — R2689 Other abnormalities of gait and mobility: Secondary | ICD-10-CM

## 2023-03-06 DIAGNOSIS — R208 Other disturbances of skin sensation: Secondary | ICD-10-CM

## 2023-03-07 ENCOUNTER — Encounter: Payer: Self-pay | Admitting: Physical Therapy

## 2023-03-07 NOTE — Therapy (Signed)
OUTPATIENT PHYSICAL THERAPY NEURO TREATMENT   Patient Name: [name] MRN: [id number] DOB:[birth year], 67 y.o., female Today's Date: 03/07/2023  PCP: Suanne Marker, MD REFERRING PROVIDER: Suanne Marker, MD  END OF SESSION:  PT End of Session - 03/06/23 0930     Visit Number 6    Number of Visits 14    Date for PT Re-Evaluation 04/14/23    Authorization Type Medicare    PT Start Time 0930    PT Stop Time 1015    PT Time Calculation (min) 45 min    Equipment Utilized During Treatment Gait belt;Other (comment)   large noodle, yellow dumbbells   Activity Tolerance Patient tolerated treatment well    Behavior During Therapy [facility] for tasks assessed/performed              Past Medical History:  Diagnosis Date   Anxiety    Daytime somnolence 04/06/2013   DDD (degenerative disc disease), lumbar    Depression    Hypercholesteremia    Hypertension    OSA on CPAP 04/06/2013   Sleep apnea    Urinary incontinence    Past Surgical History:  Procedure Laterality Date   ABLATION  09/29/2017   BACK SURGERY  04/06/2019   BREAST EXCISIONAL BIOPSY Left    CATARACT EXTRACTION Bilateral    ELECTROPHYSIOLOGIC STUDY N/A 01/01/2016   Procedure: SVT Ablation;  Surgeon: Marinus Maw, MD;  Location: [facility] INVASIVE CV LAB;  Service: Cardiovascular;  Laterality: N/A;   HAND SURGERY Right    KNEE ARTHROPLASTY Left    NASAL SEPTUM SURGERY     RADICAL HYSTERECTOMY  2000   RIGHT HEART CATH N/A 05/22/2021   Procedure: RIGHT HEART CATH;  Surgeon: Swazil[place], Peter M, MD;  Location: [facility] INVASIVE CV LAB;  Service: Cardiovascular;  Laterality: N/A;   TONSILLECTOMY AND ADENOIDECTOMY     Patient Active Problem List   Diagnosis Date Noted   Guillain Barr syndrome (HCC) 01/16/2023   Coronary artery disease involving native coronary artery of native heart without angina pectoris 08/24/2021   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   Elevated coronary artery calcium score 12/06/2020    Mixed hyperlipidemia 10/12/2020   Primary hypertension 01/19/2020   Chronic diastolic (congestive) heart failure (HCC) 01/19/2020   Dyspnea on exertion 11/05/2019   Hiatal hernia 11/05/2019   PSVT (paroxysmal supraventricular tachycardia) 01/01/2016   Thyroid nodule 04/14/2013   OSA on CPAP 04/06/2013   Daytime somnolence 04/06/2013    ONSET DATE: 01/16/2023 (referral date)  REFERRING DIAG: G61.0 (ICD-10-CM) - Guillain Barr syndrome (HCC)  THERAPY DIAG:  Other disturbances of skin sensation  Other symptoms and signs involving the nervous system  Other lack of coordination  Muscle weakness (generalized)  Unsteadiness on feet  Other abnormalities of gait and mobility  Rationale for Evaluation and Treatment: Rehabilitation  SUBJECTIVE:  SUBJECTIVE STATEMENT: Pt reports ongoing ball like feeling in left forefoot and bilateral nerve pain in feet.  She continues trying to break this pain up with exercises like heel raises at home.  She plans to return to line dancing next week, but states this is not something she wants to work on in the pool.  Pt accompanied by: self  PERTINENT HISTORY: dyspnea on exertion (thought to be due to deconditioning)   PAIN:  Are you having pain? No (Only numbness and tingling)  PRECAUTIONS: Fall  WEIGHT BEARING RESTRICTIONS: No  FALLS: Has patient fallen in last 6 months?  Reports no falls but had 3-4 stubbles and one major near fall and does feel more unbalanced in general  LIVING ENVIRONMENT: Lives with: lives with their spouse Lives in: House/apartment Stairs: Yes: Internal: 12-14 steps; on left going up and External: 3 steps; on left going up Has following equipment at home: Dan Humphreys - 2 wheeled and shower chair  PLOF: Independent - Retired working as a  Charity fundraiser   PATIENT GOALS: "To make sure that I am balanced and get my feet unlocked and work on Engineer, mining. I would also like to get back to line dancing."   OBJECTIVE:   DIAGNOSTIC FINDINGS:  CT Head wo Contrast:  IMPRESSION: Unremarkable head CT.  No explanation for symptoms.  COGNITION: Overall cognitive status: Within functional limits for tasks assessed    LOWER EXTREMITY ROM:      Grossly WFL  LOWER EXTREMITY MMT:    MMT Right Eval Left Eval  Hip flexion 4-/5 4-/5  Hip extension    Hip abduction 3+/5 3+/5  Hip adduction 3+/5 3+/5  Hip internal rotation    Hip external rotation    Knee flexion 3+/5 3+/5  Knee extension 4-/5 4-/5  Ankle dorsiflexion 3+/5 3+/5  Ankle plantarflexion    Ankle inversion    Ankle eversion    (Blank rows = not tested)   VITALS:  There were no vitals filed for this visit.   TODAY'S TREATMENT:                                                                                                                              Aquatic therapy at [facility] - pool temperature 92 degrees   Patient seen for aquatic therapy today.  Treatment took place in water 3.6-4.8 feet deep depending upon activity.  Patient entered and exited the pool via stairs using single rail at modI level.   Exercises: -Water walking 4 x 18 ft forwards > backwards > laterally -Lateral lunges w/ yellow dumbbell abduction/adduction 4 x 18 ft -Forward walking marches using dumbbells for counterbalance 4 x 18 ft w/ pt maintaining forward incline and walking on toes per comfort -Squats x20 using yellow dumbbells as counterbalance -Standing feet apart performing 3 rounds of alternating arm flutter at sides with yellow dumbbells under water for core engagement > horizontal abduction/adduction w/ yellow dumbbells just under surface of water  x2 minutes for core engagement -Seated on bench in 3.6 foot water, seated flutter kicks no UE support emphasis on upright posture 2x1  minute > STS 2x10, pt reports these feel easier and better than on land -Standing using unilateral wall support performing 3-way hip x15 each direction each LE -Seated in 'U' shaped noodle floating x1 minute > alternating UE raises for core engagement to seated balance, CGA posteriorly for safety in 4.8 ft water -Straddle position on noodle 4x18 ft of UE/LE paddling for core engagement  Patient requires buoyancy of the water for support for reduced fall risk with gait training and balance exercises with SBA-CGA support. Exercises able to be performed safely in water without the risk of fall compared to those same exercises performed on land; viscosity of water needed for resistance for strengthening. Current of water provides perturbations for challenging static and dynamic balance.   PATIENT EDUCATION: Education details: Plan for next aquatic session - ai chi and PT to [facility]  Benefits of aquatics for her specific deficits. Person educated: Patient Education method: Explanation Education comprehension: verbalized understanding and needs further education  HOME EXERCISE PROGRAM: Access Code: [email or web address] URL: [email or web address] Date: 02/10/2023 Prepared by: Maryruth Eve  Exercises - Sit to Stand Without Arm Support  - 1 x daily - 5 x weekly - 3 sets - 10 reps - Tandem Walking with Counter Support  - 1 x daily - 5 x weekly - 3 sets - Corner Balance Feet Together With Eyes Closed  - 1 x daily - 7 x weekly - 3 sets - 30 seconds hold - Corner Balance Feet Together: Eyes Closed With Head Turns  - 1 x daily - 7 x weekly - 3 sets - 10 reps  GOALS: Goals reviewed with patient? Yes  SHORT TERM GOALS: Target date: 03/03/2023  Patient will demonstrate independence with initial HEP to continue to progress between physical therapy sessions.   Baseline: Progressing but patient will benefit from aquatic therapy to help manage LE symptoms Goal status:  INITIAL  2.  Functional Gait assessment to be assessed/ LTG goal written Baseline: Assessed on 7/15 Goal status: MET  3.  Patient will improve their 5x Sit to Stand score to less than 15 seconds without UE support to demonstrate a decreased risk for falls and improved LE strength.   Baseline: 17.2 seconds with thigh assist Goal status: INITIAL  LONG TERM GOALS: Target date: 03/31/2023  Patient will report demonstrate independence with final HEP in order to maintain current gains and continue to progress after physical therapy discharge.   Baseline: To be assessed Goal status: INITIAL  2.  Patient will improve ABC Score to 86% or greater to indicate improved self-reported confidence in balance and sense of steadiness.    Baseline: 72% Goal status: INITIAL  3.  Patient will improve gait speed to 1.1 m/s or greater to indicate a reduced risk for falls.   Baseline: 0.93 m/s without AD Goal status: INITIAL  4.  Patient will improve FGA to greater than 22/30 to indicate a decreased risk of falls and improved dynamic stability.    Baseline: 16/30  Goal status: INITIAL  5.  Patient will improve their 5x Sit to Stand score to less than 12 seconds to demonstrate a decreased risk for falls and improved LE strength.   Baseline: 17.2 seconds with thigh assist Goal status: INITIAL  ASSESSMENT:  CLINICAL IMPRESSION:  Initial aquatic session performed today with  patient demonstrating good balance strategies in the pool as well as good ability to perform high level tasks in water environment.  She would benefit from continuation of aquatics in community setting and has membership to a [employer].  PT to establish pool HEP from exercises performed today.  Will continue land POC.  OBJECTIVE IMPAIRMENTS: Abnormal gait, decreased activity tolerance, decreased balance, decreased endurance, decreased mobility, difficulty walking, decreased strength, impaired sensation, and impaired UE functional use.    ACTIVITY LIMITATIONS: carrying, squatting, transfers, and locomotion level  PARTICIPATION LIMITATIONS: community activity and line dancing  PERSONAL FACTORS: Fitness and 1 comorbidity: see above  are also affecting patient's functional outcome.   REHAB POTENTIAL: Good  CLINICAL DECISION MAKING: Stable/uncomplicated  EVALUATION COMPLEXITY: Low  PLAN:  PT FREQUENCY: 1-2x/week  PT DURATION: 8 weeks  PLANNED INTERVENTIONS: Therapeutic exercises, Therapeutic activity, Neuromuscular re-education, Balance training, Gait training, Patient/Family education, Self Care, Aquatic Therapy, and Re-evaluation  PLAN FOR NEXT SESSION:  progress initial HEP, balance stretching, activities for line dancing, consider ASO for ankle stability as needed, pain management for neuropathy, work on postural control  Aquatic: Ai chi, postural work - Human resources officer and possibly Ai Chi   Camille Bal, PT, DPT  03/07/2023, 6:16 AM

## 2023-03-11 ENCOUNTER — Encounter: Payer: Self-pay | Admitting: Diagnostic Neuroimaging

## 2023-03-11 ENCOUNTER — Ambulatory Visit: Payer: Medicare Other | Admitting: Occupational Therapy

## 2023-03-11 ENCOUNTER — Encounter: Payer: Self-pay | Admitting: Physical Therapy

## 2023-03-11 ENCOUNTER — Ambulatory Visit: Payer: Medicare Other | Admitting: Physical Therapy

## 2023-03-11 VITALS — BP 140/96 | HR 63

## 2023-03-11 DIAGNOSIS — R2689 Other abnormalities of gait and mobility: Secondary | ICD-10-CM

## 2023-03-11 DIAGNOSIS — M6281 Muscle weakness (generalized): Secondary | ICD-10-CM

## 2023-03-11 DIAGNOSIS — R29818 Other symptoms and signs involving the nervous system: Secondary | ICD-10-CM

## 2023-03-11 DIAGNOSIS — R2681 Unsteadiness on feet: Secondary | ICD-10-CM

## 2023-03-11 DIAGNOSIS — R208 Other disturbances of skin sensation: Secondary | ICD-10-CM

## 2023-03-11 NOTE — Therapy (Signed)
OUTPATIENT OCCUPATIONAL THERAPY NEURO TREATMENT  Patient Name: [name] MRN: [id number] DOB:16-Mar-1956, 67 y.o., female Today's Date: 03/11/2023  PCP: Chilton Greathouse, MD REFERRING PROVIDER: Suanne Marker, MD  END OF SESSION:  OT End of Session - 03/11/23 1019     Visit Number 6    Number of Visits 8   + evaluation   Date for OT Re-Evaluation 04/04/23    Authorization Type MEDICARE PART A AND B    Progress Note Due on Visit 10    OT Start Time 1020    OT Stop Time 1100    OT Time Calculation (min) 40 min    Equipment Utilized During Treatment --    Activity Tolerance Patient tolerated treatment well    Behavior During Therapy [facility] for tasks assessed/performed             Past Medical History:  Diagnosis Date   Anxiety    Daytime somnolence 04/06/2013   DDD (degenerative disc disease), lumbar    Depression    Hypercholesteremia    Hypertension    OSA on CPAP 04/06/2013   Sleep apnea    Urinary incontinence    Past Surgical History:  Procedure Laterality Date   ABLATION  09/29/2017   BACK SURGERY  04/06/2019   BREAST EXCISIONAL BIOPSY Left    CATARACT EXTRACTION Bilateral    ELECTROPHYSIOLOGIC STUDY N/A 01/01/2016   Procedure: SVT Ablation;  Surgeon: Marinus Maw, MD;  Location: [facility] INVASIVE CV LAB;  Service: Cardiovascular;  Laterality: N/A;   HAND SURGERY Right    KNEE ARTHROPLASTY Left    NASAL SEPTUM SURGERY     RADICAL HYSTERECTOMY  2000   RIGHT HEART CATH N/A 05/22/2021   Procedure: RIGHT HEART CATH;  Surgeon: Swaziland, Peter M, MD;  Location: [facility] INVASIVE CV LAB;  Service: Cardiovascular;  Laterality: N/A;   TONSILLECTOMY AND ADENOIDECTOMY     Patient Active Problem List   Diagnosis Date Noted   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   Coronary artery disease involving native coronary artery of native heart without angina pectoris 08/24/2021   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   Elevated coronary artery calcium score 12/06/2020    Mixed hyperlipidemia 10/12/2020   Primary hypertension 01/19/2020   Chronic diastolic (congestive) heart failure (HCC) 01/19/2020   Dyspnea on exertion 11/05/2019   Hiatal hernia 11/05/2019   PSVT (paroxysmal supraventricular tachycardia) 01/01/2016   Thyroid nodule 04/14/2013   OSA on CPAP 04/06/2013   Daytime somnolence 04/06/2013    ONSET DATE: Referral: 01/16/2023 Onset: 12/16/22  REFERRING DIAG: G61.0 (ICD-10-CM) - Guillain Barr syndrome  THERAPY DIAG:  Other disturbances of skin sensation  Other symptoms and signs involving the nervous system  Rationale for Evaluation and Treatment: Rehabilitation  SUBJECTIVE:   SUBJECTIVE STATEMENT:  Patient reported the pool therapy feeling really good for her to do her exercises etc  She continues to report that the most benefit for her symptoms during therapy has been from the use of the fluidotherapy, so much so, that she still wishes she could try it on her feet.  She reports that her pain has continued to remain down from 8/10 at eval/originally to 6/10 lately.   Pt accompanied by: self  PERTINENT HISTORY:   MD report: 67 year old female evaluated for rapidly progressive ascending numbness and weakness.   Symptoms started around Dec 16, 2022.  Patient noticed a numbness sensation on her left forefoot and toes.  This progressed to the right foot and up her  legs.  Within a week she had numbness and tingling and weakness in her hands up to her elbows.  She went to the emergency room twice for evaluation.  She was diagnosed with neuropathy and was recommended to follow-up with neurology.   Of note patient had preceding upper respiratory infection symptoms Dec 12, 2022.  She also had RSV vaccination earlier in May.   Symptoms have plateaued since 01/04/23.  Using a walker at home.  She feels weakness in her hands, arms, legs.  No breathing issues.  No swallowing issues.  No prior similar symptoms.  PRECAUTIONS: No  WEIGHT BEARING  RESTRICTIONS: No  PAIN:  Are you having pain? Yes: NPRS scale: 6/10 Pain location: both hands/feet are worse Pain description: tingling - pins and needles (both hands and feet) Aggravating factors:  hard to determine Relieving factors: Gabapentin  FALLS: Has patient fallen in last 6 months? 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 Grooming: MI UB Dressing: 2 weeks ago - she could not fasten bra LB Dressing: Still feels stiff when putting on socks and is still slower at this task.  Reports she can tie her laces now. Toileting: MI Bathing: Sitting to bath due to being unsteady Tub Shower transfers: MI with walk in shower Equipment: Shower seat with back  IADLs: Shopping: Patient was initailly driven by her husband for about 3 weeks but is now drving and returning to community outings/tasks on her own  Light housekeeping: Can help do her laundry now. Meal Prep: Husband does meal prep (previously did so also) Community mobility: Was using walker until about 6/5 - 01/17/23 - staying at home during that time Medication management: Returning to ability to open pill containers Financial management: Not asked. 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  COORDINATION:  9 Hole Peg test: Right: 23.05  sec; Left: 24.11  sec (Patient does reports task feeling funny and awkward compared to normal).  SENSATION: Light touch: Impaired palmar surfaces are tingling even with different temperatures Diminished from wrist and below bilaterally for UEs palmar surfaces and below ankles   EDEMA: noted fingers being stiff and swollen and L toes  MUSCLE TONE: WFL  COGNITION: Overall cognitive status: Within functional limits for tasks assessed  VISION: Subjective report: H/O cataract removal.  Has different glasses for  distance and for reading Baseline vision: Wears glasses for distance only and Wears glasses for reading only Visual history: cataracts  VISION ASSESSMENT: Not tested  Patient reports no difficulty with activities due to vision impairments  PERCEPTION: Not tested  PRAXIS: Not tested  EVALUATION OBSERVATIONS: Patient is a 67 y.o. female who was seen today for occupational therapy evaluation for UE numbness and tingling with resulting weakness and incoordination as a result of Guillain Barre Syndrome. Hx includes recent dx of probable Guillain Barre Syndrome. Patient currently presents below baseline level of functioning demonstrating functional deficits and impairments as noted below. Pt would benefit from skilled OT services in the outpatient setting to work on impairments as noted below to help pt return to PLOF as able.       TODAY'S TREATMENT:                                                                                                                                   Therapeutic Activities:   Pt placed BUE in Fluidotherapy machine with supervised ROM and sensory stimulation x 10 min. Pt was educated to complete gentle AROM during modality time to improve ROM and decrease pain/stiffness, tingling and numbness of hands by use of the machine's massaging action and thermal properties.   While in Fluidotherapy, OT/patient reviewed sensory stimulation ideas and considerations.  Patient notes some relief  with strumming as she continues to play her ukulele and she doesn't notice the tingling and numbness during that 30+ minute session daily or up to > 1 hour during group sessions on Wednesday.  She is continuing to squeeze her Needoh gumdrop, which she brought with her along with her putty and finds the activities ie) snow plow in putty and continuous movements at times, are a good distraction from the numbness and tingling.    Various textures used for sensory stimulation today including different cloth textures (scrubby, washcloth, cotton etc) and rice bin with objects to dig in.  Patient is not bothered by the different textures and enjoys moving through different sensory options with preference for a taller container for rice to push her arm and forearm into rather than just her hand/fingers.    PATIENT EDUCATION: Education details: Set designer Person educated: Patient Education method: Medical illustrator Education comprehension: verbalized understanding and returned demonstration  HOME EXERCISE PROGRAM: 02/12/23 Putty Exercises: Access Code: [email or web address] 02/19/23 Coordination Activities with Images 02/25/23 Desensitization Program handout 03/04/23 Progression of Putty Exercises: Access Code: [email or web address]   GOALS: Goals reviewed with patient? Yes  SHORT TERM GOALS: Target date: 03/07/23  Patient will demonstrate UE strength/coordination HEP with 25% verbal cues or  less for proper execution.  Baseline: To be provided Goal status: MET  2.  Patient will demonstrate improved sensory awareness/Stereognosis to identify different items in her purse/pocket and different coins with vision occluded.  Baseline: TBA - Tingling & numbness in B palmar surfaces  Goal status:  IN PROGRESS  3.  Patient will improve pinch strength/coordination & sensation of B hands to increase ease & independence in tying shoelaces, and manage buttons with vision occluded Baseline: Self reported difficulty. Goal status: IN PROGRESS   LONG TERM GOALS: Target date: 04/04/23  Patient will demonstrate MI with sensory stimulation HEP with MI  Baseline: To be provided. Goal status: IN PROGRESS  2.  Patient will demonstrate at least 30 lbs R UE grip strength and 25 lbs L UE grip strength as needed to open jars and other containers.  Baseline: Rt 22 lbs; Lt 18 lbs Goal status: IN PROGRESS  3.  Patient will demonstrate UE coordination to resume playing the ukulele  Baseline: Unable to move fingers for different cords by self report. Goal status: MET  ASSESSMENT:  CLINICAL IMPRESSION: Patient is a 67 y.o. female who returned today for OT today due to UE dysfunction due to neuropathy and resulting coordination and strength deficits due to Guillain Barre Syndrome affecting B hands (palmar surfaces especially). She continues to enjoy comfort provided by Fluidotherapy and works on various sensory stimulation and FM activities at home.  Pt will benefit from continued skilled OT services in the outpatient setting to work on sensory and coordination deficits to help pt return to PLOF as able.     PERFORMANCE DEFICITS: in functional skills including ADLs, coordination, dexterity, sensation, strength, flexibility, Fine motor control, decreased knowledge of precautions, and UE functional use.  IMPAIRMENTS: are limiting patient from ADLs, leisure, and social participation.   CO-MORBIDITIES: may  have co-morbidities  that affects occupational performance. Patient will benefit from skilled OT to address above impairments and improve overall function.  REHAB POTENTIAL: Excellent  PLAN:  OT FREQUENCY: 1x/week  OT DURATION: 8 weeks  PLANNED INTERVENTIONS: self care/ADL training, therapeutic exercise, therapeutic activity, neuromuscular re-education, balance training, fluidotherapy, patient/family education, coping strategies training, and DME and/or AE instructions  RECOMMENDED OTHER SERVICES: PT treatments in place s/p eval on same day as OT.  CONSULTED AND AGREED WITH PLAN OF CARE: Patient  PLAN FOR NEXT SESSION:   Stereognosis to identify different items  Check ease with tying shoelaces, and manage buttons  Retest Grip strength (DC planning).  Review & progress sensory stimulation education, putty/coordination HEP ideas.  Modalities for neuropathy ie) trial fluidotherapy vs TENS/etim, [name].  Would heat options be effective also for home carryover.   Victorino Sparrow, OT 03/11/2023, 5:09 PM

## 2023-03-11 NOTE — Telephone Encounter (Signed)
I called patient. Overall symptoms have improved. Some mild residual sxs in left > right foot. Continue to monitor symptoms. Will setup follow up appointment ~Jan 2025.   Suanne Marker, MD 03/11/2023, 4:49 PM Certified in Neurology, Neurophysiology and Neuroimaging  [facility] [address], [address] [place], [place] [address] [phone]

## 2023-03-11 NOTE — Therapy (Signed)
OUTPATIENT PHYSICAL THERAPY NEURO TREATMENT   Patient Name: [name] MRN: [id number] DOB:January 17, 1956, 67 y.o., female Today's Date: 03/11/2023  PCP: Suanne Marker, MD REFERRING PROVIDER: Suanne Marker, MD  END OF SESSION:  PT End of Session - 03/11/23 1225     Visit Number 7    Number of Visits 14    Date for PT Re-Evaluation 04/14/23    Authorization Type Medicare    PT Start Time 1101    PT Stop Time 1146    PT Time Calculation (min) 45 min    Equipment Utilized During Treatment Gait belt    Activity Tolerance Patient tolerated treatment well    Behavior During Therapy [facility] for tasks assessed/performed             Past Medical History:  Diagnosis Date   Anxiety    Daytime somnolence 04/06/2013   DDD (degenerative disc disease), lumbar    Depression    Hypercholesteremia    Hypertension    OSA on CPAP 04/06/2013   Sleep apnea    Urinary incontinence    Past Surgical History:  Procedure Laterality Date   ABLATION  09/29/2017   BACK SURGERY  04/06/2019   BREAST EXCISIONAL BIOPSY Left    CATARACT EXTRACTION Bilateral    ELECTROPHYSIOLOGIC STUDY N/A 01/01/2016   Procedure: SVT Ablation;  Surgeon: Marinus Maw, MD;  Location: [facility] INVASIVE CV LAB;  Service: Cardiovascular;  Laterality: N/A;   HAND SURGERY Right    KNEE ARTHROPLASTY Left    NASAL SEPTUM SURGERY     RADICAL HYSTERECTOMY  2000   RIGHT HEART CATH N/A 05/22/2021   Procedure: RIGHT HEART CATH;  Surgeon: Swazil[place], Peter M, MD;  Location: [facility] INVASIVE CV LAB;  Service: Cardiovascular;  Laterality: N/A;   TONSILLECTOMY AND ADENOIDECTOMY     Patient Active Problem List   Diagnosis Date Noted   Guillain Barr syndrome (HCC) 01/16/2023   Coronary artery disease involving native coronary artery of native heart without angina pectoris 08/24/2021   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   Elevated coronary artery calcium score 12/06/2020   Mixed hyperlipidemia 10/12/2020   Primary  hypertension 01/19/2020   Chronic diastolic (congestive) heart failure (HCC) 01/19/2020   Dyspnea on exertion 11/05/2019   Hiatal hernia 11/05/2019   PSVT (paroxysmal supraventricular tachycardia) 01/01/2016   Thyroid nodule 04/14/2013   OSA on CPAP 04/06/2013   Daytime somnolence 04/06/2013    ONSET DATE: 01/16/2023 (referral date)  REFERRING DIAG: G61.0 (ICD-10-CM) - Guillain Barr syndrome (HCC)  THERAPY DIAG:  Muscle weakness (generalized)  Unsteadiness on feet  Other abnormalities of gait and mobility  Rationale for Evaluation and Treatment: Rehabilitation  SUBJECTIVE:  SUBJECTIVE STATEMENT: Pt reports that she loved the pool. She is hoping to get going with more regular aquatic classes. Denies falls/near falls. Patient is planning on going to her first dance class this Friday.   Pt accompanied by: self  PERTINENT HISTORY: dyspnea on exertion (thought to be due to deconditioning)   PAIN:  Are you having pain? No (Only numbness and tingling)  PRECAUTIONS: Fall  WEIGHT BEARING RESTRICTIONS: No  FALLS: Has patient fallen in last 6 months?  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 I would also like to get back to line dancing."   OBJECTIVE:   DIAGNOSTIC FINDINGS:  CT Head wo Contrast:  IMPRESSION: Unremarkable head CT.  No  explanation for symptoms.  COGNITION: Overall cognitive status: Within functional limits for tasks assessed    LOWER EXTREMITY ROM:      Grossly WFL  LOWER EXTREMITY MMT:    MMT Right Eval Left Eval  Hip flexion 4-/5 4-/5  Hip extension    Hip abduction 3+/5 3+/5  Hip adduction 3+/5 3+/5  Hip internal rotation    Hip external rotation    Knee flexion 3+/5 3+/5  Knee extension 4-/5 4-/5  Ankle dorsiflexion 3+/5 3+/5  Ankle plantarflexion    Ankle inversion    Ankle eversion    (Blank rows = not tested)   VITALS:  Vitals:   03/11/23 1111 03/11/23 1113  BP: (!) 145/95 (!) 140/96  Pulse: 69 63    TODAY'S TREATMENT:                                                                                                                               TherAct:    [facility] PT Assessment - 03/11/23 0001       Standardized Balance Assessment   Standardized Balance Assessment Five Times Sit to Stand    Five times sit to stand comments  12.92   seconds without UE use (SBA)           Patient donned dance shoes which are lace up with platform forefoot and heel for remainder of session to work on ankle control in preparation for return to class; patient don/doffs independently   CopperKnob Dancing:  Easy song 1 x 5 min Intermediate song 1 x 5 min (No LOB appropriate with shoes, modI)   NMR:  With copperknob shoes donned tandem balance beam work (SBA)  - lateral stepping grape vine 2 x 10 feet  - forward stepping  tandem and taps to side 2 x 10 feet  - forward stepping tandem 4 x 10 feet  Tandem balance with yellow theraband pull apart 1 x 10  PATIENT EDUCATION: Education details: Plan for next aquatic session - ai chi and PT to [facility]  Benefits of aquatics for her specific deficits. Person educated: Patient Education method: Explanation Education comprehension: verbalized understanding and needs further education  HOME EXERCISE  PROGRAM: Access Code: [email or web address] URL: [email or web address] Date: 02/10/2023 Prepared by: Maryruth Eve  Exercises - Sit to Stand Without Arm Support  - 1 x daily - 5 x weekly - 3 sets - 10 reps - Tandem Walking with Counter Support  - 1 x daily - 5 x weekly - 3 sets - Corner Balance Feet Together With Eyes Closed  - 1 x daily - 7 x weekly - 3 sets - 30 seconds hold - Corner Balance Feet Together: Eyes Closed With Head Turns  - 1 x daily - 7 x weekly - 3 sets - 10 reps  GOALS: Goals reviewed with patient? Yes  SHORT TERM GOALS: Target date: 03/03/2023  Patient will demonstrate independence with initial HEP to continue to progress between physical therapy sessions.   Baseline: Progressing but patient will benefit from aquatic therapy to help manage LE symptoms; patient reports confidence in HEP Goal status: MET  2.  Functional Gait assessment to be assessed/ LTG goal written Baseline: Assessed on 7/15 Goal status: MET  3.  Patient will improve their 5x Sit to Stand score to less than 15 seconds without UE support to demonstrate a decreased risk for falls and improved LE strength.   Baseline: 17.2 seconds with thigh assist; improved 12.92 second without UE use Goal status: INITIAL  LONG TERM GOALS: Target date: 03/31/2023  Patient will report demonstrate independence with final HEP in order to maintain current gains and continue to progress after physical therapy discharge.   Baseline: To be assessed Goal status: INITIAL  2.  Patient will improve ABC Score to 86% or greater to indicate improved self-reported confidence in balance and sense of steadiness.    Baseline: 72% Goal status: INITIAL  3.  Patient will improve gait speed to 1.1 m/s or greater to indicate a reduced risk for falls.   Baseline: 0.93 m/s without AD Goal status: INITIAL  4.  Patient will improve FGA to greater than 22/30 to indicate a decreased risk of falls and improved dynamic stability.     Baseline: 16/30  Goal status: INITIAL  5.  Patient will improve their 5x Sit to Stand score to less than 12 seconds to demonstrate a decreased risk for falls and improved LE strength.   Baseline: 17.2 seconds with thigh assist; improved 12.92 seconds without UE use Goal status: PROGRESSING ASSESSMENT:  CLINICAL IMPRESSION:  Session emphasized assessment of STGs, patient achieving 3/3 in addition to work in copper knob dance shoes to increase patient's confidence and assess safety before returning to class. Patient able to perform simpler and more complex line dance in shoes modI with no LOB and appropriate ankle stability. Patient continues to demonstrate mild increase challenge on compliant surface and so will continue to progress. Will continue land POC.  OBJECTIVE IMPAIRMENTS: Abnormal gait, decreased activity tolerance, decreased balance, decreased endurance, decreased mobility, difficulty walking, decreased strength, impaired sensation, and impaired UE functional use.   ACTIVITY LIMITATIONS: carrying, squatting, transfers, and locomotion level  PARTICIPATION LIMITATIONS: community activity and line dancing  PERSONAL FACTORS: Fitness and 1 comorbidity: see above  are also affecting patient's functional outcome.   REHAB POTENTIAL: Good  CLINICAL DECISION MAKING: Stable/uncomplicated  EVALUATION COMPLEXITY: Low  PLAN:  PT FREQUENCY: 1-2x/week  PT DURATION: 8 weeks  PLANNED INTERVENTIONS: Therapeutic exercises, Therapeutic activity, Neuromuscular re-education, Balance training, Gait training, Patient/Family education, Self Care, Aquatic Therapy, and Re-evaluation  PLAN FOR NEXT SESSION:  progress initial HEP, balance stretching, activities for line dancing, pain management for neuropathy, work on postural control, recert for pool on in early Sept.   Aquatic: Ai chi, postural work - Human resources officer and possibly Ai Chi   Camille Bal, PT, DPT  03/11/2023, 5:19  PM

## 2023-03-12 NOTE — Telephone Encounter (Signed)
Pt scheduled to see Dr. Marjory Lies for 08/25/23 at 3:30pm

## 2023-03-13 ENCOUNTER — Ambulatory Visit (INDEPENDENT_AMBULATORY_CARE_PROVIDER_SITE_OTHER): Payer: Medicare Other | Admitting: Family Medicine

## 2023-03-13 ENCOUNTER — Encounter (INDEPENDENT_AMBULATORY_CARE_PROVIDER_SITE_OTHER): Payer: Self-pay | Admitting: Family Medicine

## 2023-03-13 VITALS — BP 138/82 | HR 85 | Temp 98.2°F | Ht 64.0 in | Wt 192.0 lb

## 2023-03-13 DIAGNOSIS — M109 Gout, unspecified: Secondary | ICD-10-CM | POA: Diagnosis not present

## 2023-03-13 DIAGNOSIS — R5383 Other fatigue: Secondary | ICD-10-CM

## 2023-03-13 DIAGNOSIS — E43 Unspecified severe protein-calorie malnutrition: Secondary | ICD-10-CM

## 2023-03-13 DIAGNOSIS — I2510 Atherosclerotic heart disease of native coronary artery without angina pectoris: Secondary | ICD-10-CM

## 2023-03-13 DIAGNOSIS — G4733 Obstructive sleep apnea (adult) (pediatric): Secondary | ICD-10-CM

## 2023-03-13 DIAGNOSIS — F32A Depression, unspecified: Secondary | ICD-10-CM

## 2023-03-13 DIAGNOSIS — E669 Obesity, unspecified: Secondary | ICD-10-CM | POA: Insufficient documentation

## 2023-03-13 DIAGNOSIS — E782 Mixed hyperlipidemia: Secondary | ICD-10-CM

## 2023-03-13 DIAGNOSIS — R0602 Shortness of breath: Secondary | ICD-10-CM | POA: Diagnosis not present

## 2023-03-13 DIAGNOSIS — G610 Guillain-Barre syndrome: Secondary | ICD-10-CM

## 2023-03-13 DIAGNOSIS — Z1331 Encounter for screening for depression: Secondary | ICD-10-CM

## 2023-03-13 DIAGNOSIS — Z6830 Body mass index (BMI) 30.0-30.9, adult: Secondary | ICD-10-CM | POA: Insufficient documentation

## 2023-03-13 DIAGNOSIS — Z6832 Body mass index (BMI) 32.0-32.9, adult: Secondary | ICD-10-CM

## 2023-03-13 DIAGNOSIS — E611 Iron deficiency: Secondary | ICD-10-CM

## 2023-03-14 LAB — COMPREHENSIVE METABOLIC PANEL
ALT: 20 IU/L (ref 0–32)
AST: 24 IU/L (ref 0–40)
Albumin: 4.6 g/dL (ref 3.9–4.9)
Alkaline Phosphatase: 52 IU/L (ref 44–121)
BUN/Creatinine Ratio: 17 (ref 12–28)
BUN: 17 mg/dL (ref 8–27)
Bilirubin Total: 0.6 mg/dL (ref 0.0–1.2)
CO2: 21 mmol/L (ref 20–29)
Calcium: 10 mg/dL (ref 8.7–10.3)
Chloride: 104 mmol/L (ref 96–106)
Creatinine, Ser: 0.98 mg/dL (ref 0.57–1.00)
Globulin, Total: 2.8 g/dL (ref 1.5–4.5)
Glucose: 88 mg/dL (ref 70–99)
Potassium: 4.5 mmol/L (ref 3.5–5.2)
Sodium: 141 mmol/L (ref 134–144)
Total Protein: 7.4 g/dL (ref 6.0–8.5)
eGFR: 64 mL/min/{1.73_m2} (ref >59)

## 2023-03-14 LAB — VITAMIN B12: Vitamin B-12: 476 pg/mL (ref 232–1245)

## 2023-03-14 LAB — CBC
Hematocrit: 46.9 % — ABNORMAL HIGH (ref 34.0–46.6)
Hemoglobin: 14.6 g/dL (ref 11.1–15.9)
MCH: 27.3 pg (ref 26.6–33.0)
MCHC: 31.1 g/dL — ABNORMAL LOW (ref 31.5–35.7)
MCV: 88 fL (ref 79–97)
Platelets: 219 10*3/uL (ref 150–450)
RBC: 5.34 x10E6/uL — ABNORMAL HIGH (ref 3.77–5.28)
RDW: 13.3 % (ref 11.7–15.4)
WBC: 6.8 10*3/uL (ref 3.4–10.8)

## 2023-03-14 LAB — LIPID PANEL
Chol/HDL Ratio: 2.8 ratio (ref 0.0–4.4)
Cholesterol, Total: 144 mg/dL (ref 100–199)
HDL: 52 mg/dL (ref >39)
LDL Chol Calc (NIH): 73 mg/dL (ref 0–99)
Triglycerides: 105 mg/dL (ref 0–149)
VLDL Cholesterol Cal: 19 mg/dL (ref 5–40)

## 2023-03-14 LAB — FOLATE: Folate: >20 ng/mL (ref >3.0)

## 2023-03-14 LAB — IRON AND TIBC
Iron Saturation: 22 % (ref 15–55)
Iron: 77 ug/dL (ref 27–139)
Total Iron Binding Capacity: 343 ug/dL (ref 250–450)
UIBC: 266 ug/dL (ref 118–369)

## 2023-03-14 LAB — TSH RFX ON ABNORMAL TO FREE T4: TSH: 0.901 u[IU]/mL (ref 0.450–4.500)

## 2023-03-14 LAB — VITAMIN D 25 HYDROXY (VIT D DEFICIENCY, FRACTURES): Vit D, 25-Hydroxy: 28.6 ng/mL — ABNORMAL LOW (ref 30.0–100.0)

## 2023-03-14 LAB — FERRITIN: Ferritin: 59 ng/mL (ref 15–150)

## 2023-03-14 LAB — INSULIN, RANDOM: INSULIN: 34 u[IU]/mL — ABNORMAL HIGH (ref 2.6–24.9)

## 2023-03-14 LAB — HEMOGLOBIN A1C
Est. average glucose Bld gHb Est-mCnc: 105 mg/dL
Hgb A1c MFr Bld: 5.3 % (ref 4.8–5.6)

## 2023-03-18 ENCOUNTER — Ambulatory Visit: Payer: Medicare Other | Admitting: Occupational Therapy

## 2023-03-18 ENCOUNTER — Ambulatory Visit: Payer: Medicare Other | Admitting: Physical Therapy

## 2023-03-18 ENCOUNTER — Encounter: Payer: Self-pay | Admitting: Physical Therapy

## 2023-03-18 VITALS — BP 144/86 | HR 75

## 2023-03-18 DIAGNOSIS — R2689 Other abnormalities of gait and mobility: Secondary | ICD-10-CM

## 2023-03-18 DIAGNOSIS — M6281 Muscle weakness (generalized): Secondary | ICD-10-CM | POA: Diagnosis not present

## 2023-03-18 DIAGNOSIS — R29818 Other symptoms and signs involving the nervous system: Secondary | ICD-10-CM

## 2023-03-18 DIAGNOSIS — R2681 Unsteadiness on feet: Secondary | ICD-10-CM

## 2023-03-18 DIAGNOSIS — R208 Other disturbances of skin sensation: Secondary | ICD-10-CM

## 2023-03-18 NOTE — Therapy (Signed)
OUTPATIENT PHYSICAL THERAPY NEURO TREATMENT   Patient Name: [name] MRN: [id number] DOB:1955-11-30, 67 y.o., female Today's Date: 03/18/2023  PCP: Suanne Marker, MD REFERRING PROVIDER: Suanne Marker, MD  END OF SESSION:  PT End of Session - 03/18/23 1019     Visit Number 8    Number of Visits 14    Date for PT Re-Evaluation 04/14/23    Authorization Type Medicare    PT Start Time 1016    PT Stop Time 1100    PT Time Calculation (min) 44 min    Equipment Utilized During Treatment Gait belt    Activity Tolerance Patient tolerated treatment well    Behavior During Therapy WFL for tasks assessed/performed             Past Medical History:  Diagnosis Date   ADD (attention deficit disorder)    Anxiety    Back pain    CHF (congestive heart failure) (HCC)    Constipation    Daytime somnolence 04/06/2013   DDD (degenerative disc disease), lumbar    Depression    Depression    GERD (gastroesophageal reflux disease)    Hypercholesteremia    Hypertension    IBS (irritable bowel syndrome)    Joint pain    OSA on CPAP 04/06/2013   Palpitations    Sleep apnea    Swelling of both lower extremities    Thyroid condition    Urinary incontinence    Past Surgical History:  Procedure Laterality Date   ABLATION  09/29/2017   BACK SURGERY  04/06/2019   BREAST EXCISIONAL BIOPSY Left    CATARACT EXTRACTION Bilateral    ELECTROPHYSIOLOGIC STUDY N/A 01/01/2016   Procedure: SVT Ablation;  Surgeon: Marinus Maw, MD;  Location: [facility] INVASIVE CV LAB;  Service: Cardiovascular;  Laterality: N/A;   HAND SURGERY Right    KNEE ARTHROPLASTY Left    NASAL SEPTUM SURGERY     RADICAL HYSTERECTOMY  2000   RIGHT HEART CATH N/A 05/22/2021   Procedure: RIGHT HEART CATH;  Surgeon: Swazil[place], Peter M, MD;  Location: [facility] INVASIVE CV LAB;  Service: Cardiovascular;  Laterality: N/A;   TONSILLECTOMY AND ADENOIDECTOMY     Patient Active Problem List   Diagnosis Date Noted   Other  fatigue 03/13/2023   SOBOE (shortness of breath on exertion) 03/13/2023   Gout 03/13/2023   Iron deficiency 03/13/2023   Unspecified severe protein-calorie malnutrition (HCC) 03/13/2023   Depression screen 03/13/2023   Generalized obesity 03/13/2023   Obesity (BMI 30-39.9) 03/13/2023   Guillain Barr syndrome (HCC) 01/16/2023   Coronary artery disease involving native coronary artery of native heart without angina pectoris 08/24/2021   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   Elevated coronary artery calcium score 12/06/2020   Mixed hyperlipidemia 10/12/2020   Primary hypertension 01/19/2020   Chronic diastolic (congestive) heart failure (HCC) 01/19/2020   Dyspnea on exertion 11/05/2019   Hiatal hernia 11/05/2019   PSVT (paroxysmal supraventricular tachycardia) 01/01/2016   Thyroid nodule 04/14/2013   OSA on CPAP 04/06/2013   Daytime somnolence 04/06/2013    ONSET DATE: 01/16/2023 (referral date)  REFERRING DIAG: G61.0 (ICD-10-CM) - Guillain Barr syndrome (HCC)  THERAPY DIAG:  Muscle weakness (generalized)  Other abnormalities of gait and mobility  Unsteadiness on feet  Rationale for Evaluation and Treatment: Rehabilitation  SUBJECTIVE:  SUBJECTIVE STATEMENT: Pt reports that she was able to go to the pool yesterday and it went very well. She also returned to dance class and stepped out for the fast turn songs. Denies falls/near falls. Patient wanting to prioritize core and postural control to improve balance.  Pt accompanied by: self  PERTINENT HISTORY: dyspnea on exertion (thought to be due to deconditioning)   PAIN:  Are you having pain? No (Only numbness and tingling)  PRECAUTIONS: Fall  WEIGHT BEARING RESTRICTIONS: No  FALLS: Has patient fallen in last 6 months?  Reports  no falls but had 3-4 stubbles and one major near fall and does feel more unbalanced in general  LIVING ENVIRONMENT: Lives with: lives with their spouse Lives in: House/apartment Stairs: Yes: Internal: 12-14 steps; on left going up and External: 3 steps; on left going up Has following equipment at home: Dan Humphreys - 2 wheeled and shower chair  PLOF: Independent - Retired working as a Char[employer] fundraiser   PATIENT GOALS: "To make sure that I am balanced and get my feet unlocked and work on Engineer, mining. I would also like to get back to line dancing."   OBJECTIVE:   DIAGNOSTIC FINDINGS:  CT Head wo Contrast:  IMPRESSION: Unremarkable head CT.  No explanation for symptoms.  COGNITION: Overall cognitive status: Within functional limits for tasks assessed    LOWER EXTREMITY ROM:      Grossly WFL  LOWER EXTREMITY MMT:    MMT Right Eval Left Eval  Hip flexion 4-/5 4-/5  Hip extension    Hip abduction 3+/5 3+/5  Hip adduction 3+/5 3+/5  Hip internal rotation    Hip external rotation    Knee flexion 3+/5 3+/5  Knee extension 4-/5 4-/5  Ankle dorsiflexion 3+/5 3+/5  Ankle plantarflexion    Ankle inversion    Ankle eversion    (Blank rows = not tested)   VITALS:  Vitals:   03/18/23 1033  BP: (!) 144/86  Pulse: 75     TODAY'S TREATMENT:                                                                                                                               Vitals:   03/18/23 1033  BP: (!) 144/86  Pulse: 75    TherEx: - Hip hinging on firm ground basics x 10 - Hip hinging with dead lift form on airex pad for increased stability challenge x 10 - Hip hinging with dead lift form on airex pad for increased stability challenge with 5lb kettle bell hold x 10 - Lunge with single UE support into overhead press and march 2 x10, added 2lb weight on second set - Standing marching on airex 1 x 15 (SBA)   PATIENT EDUCATION: Education details: Updates to HEP Person educated:  Patient Education method: Explanation Education comprehension: verbalized understanding and needs further education  HOME EXERCISE PROGRAM: Access Code: [email or web address] URL: [email or web address] Date: 02/10/2023 Prepared by: Maryruth Eve  Exercises -  Sit to Stand Without Arm Support  - 1 x daily - 5 x weekly - 3 sets - 10 reps - Tandem Walking with Counter Support  - 1 x daily - 5 x weekly - 3 sets - Corner Balance Feet Together With Eyes Closed  - 1 x daily - 7 x weekly - 3 sets - 30 seconds hold - Corner Balance Feet Together: Eyes Closed With Head Turns  - 1 x daily - 7 x weekly - 3 sets - 10 reps - Kettlebell Deadlift  - 1 x daily - 7 x weekly - 3 sets - 10 reps - Lunge with Counter Support  - 1 x daily - 7 x weekly - 3 sets - 10 reps  GOALS: Goals reviewed with patient? Yes  SHORT TERM GOALS: Target date: 03/03/2023  Patient will demonstrate independence with initial HEP to continue to progress between physical therapy sessions.   Baseline: Progressing but patient will benefit from aquatic therapy to help manage LE symptoms; patient reports confidence in HEP Goal status: MET  2.  Functional Gait assessment to be assessed/ LTG goal written Baseline: Assessed on 7/15 Goal status: MET  3.  Patient will improve their 5x Sit to Stand score to less than 15 seconds without UE support to demonstrate a decreased risk for falls and improved LE strength.   Baseline: 17.2 seconds with thigh assist; improved 12.92 second without UE use Goal status: INITIAL  LONG TERM GOALS: Target date: 03/31/2023  Patient will report demonstrate independence with final HEP in order to maintain current gains and continue to progress after physical therapy discharge.   Baseline: To be assessed Goal status: INITIAL  2.  Patient will improve ABC Score to 86% or greater to indicate improved self-reported confidence in balance and sense of steadiness.    Baseline: 72% Goal status: INITIAL  3.   Patient will improve gait speed to 1.1 m/s or greater to indicate a reduced risk for falls.   Baseline: 0.93 m/s without AD Goal status: INITIAL  4.  Patient will improve FGA to greater than 22/30 to indicate a decreased risk of falls and improved dynamic stability.    Baseline: 16/30  Goal status: INITIAL  5.  Patient will improve their 5x Sit to Stand score to less than 12 seconds to demonstrate a decreased risk for falls and improved LE strength.   Baseline: 17.2 seconds with thigh assist; improved 12.92 seconds without UE use Goal status: PROGRESSING ASSESSMENT:  CLINICAL IMPRESSION:  Session emphasized progression of higher level strengthening and stability tasks with functional movements such as sit to stands/lunges. Patient tolerated well; requires intermittent breaks between sets due to fatigue but demonstrates appropriate understanding of hip hinging by end of session. Will continue land POC.  OBJECTIVE IMPAIRMENTS: Abnormal gait, decreased activity tolerance, decreased balance, decreased endurance, decreased mobility, difficulty walking, decreased strength, impaired sensation, and impaired UE functional use.   ACTIVITY LIMITATIONS: carrying, squatting, transfers, and locomotion level  PARTICIPATION LIMITATIONS: community activity and line dancing  PERSONAL FACTORS: Fitness and 1 comorbidity: see above  are also affecting patient's functional outcome.   REHAB POTENTIAL: Good  CLINICAL DECISION MAKING: Stable/uncomplicated  EVALUATION COMPLEXITY: Low  PLAN:  PT FREQUENCY: 1-2x/week  PT DURATION: 8 weeks  PLANNED INTERVENTIONS: Therapeutic exercises, Therapeutic activity, Neuromuscular re-education, Balance training, Gait training, Patient/Family education, Self Care, Aquatic Therapy, and Re-evaluation  PLAN FOR NEXT SESSION:  progress initial HEP, balance stretching, activities for line dancing, pain management for neuropathy, work on postural  control, recert for pool  on in early Sept., progress higher level balance tasks   Aquatic: Ai chi, postural work - Human resources officer and possibly Ai Chi  Maryruth Eve, PT, DPT 03/18/2023, 12:09 PM

## 2023-03-18 NOTE — Therapy (Unsigned)
OUTPATIENT OCCUPATIONAL THERAPY NEURO TREATMENT  Patient Name: [name] MRN: [id number] DOB:May 18, 1956, 67 y.o., female Today's Date: 03/18/2023  PCP: Chilton Greathouse, MD REFERRING PROVIDER: Suanne Marker, MD  END OF SESSION:  OT End of Session - 03/18/23 1103     Visit Number 7    Number of Visits 8   + evaluation   Date for OT Re-Evaluation 04/04/23    Authorization Type MEDICARE PART A AND B    Progress Note Due on Visit 10    OT Start Time 1102    OT Stop Time 1145    OT Time Calculation (min) 43 min    Equipment Utilized During Treatment E stim    Activity Tolerance Patient tolerated treatment well    Behavior During Therapy WFL for tasks assessed/performed             Past Medical History:  Diagnosis Date   ADD (attention deficit disorder)    Anxiety    Back pain    CHF (congestive heart failure) (HCC)    Constipation    Daytime somnolence 04/06/2013   DDD (degenerative disc disease), lumbar    Depression    Depression    GERD (gastroesophageal reflux disease)    Hypercholesteremia    Hypertension    IBS (irritable bowel syndrome)    Joint pain    OSA on CPAP 04/06/2013   Palpitations    Sleep apnea    Swelling of both lower extremities    Thyroid condition    Urinary incontinence    Past Surgical History:  Procedure Laterality Date   ABLATION  09/29/2017   BACK SURGERY  04/06/2019   BREAST EXCISIONAL BIOPSY Left    CATARACT EXTRACTION Bilateral    ELECTROPHYSIOLOGIC STUDY N/A 01/01/2016   Procedure: SVT Ablation;  Surgeon: Marinus Maw, MD;  Location: [facility] INVASIVE CV LAB;  Service: Cardiovascular;  Laterality: N/A;   HAND SURGERY Right    KNEE ARTHROPLASTY Left    NASAL SEPTUM SURGERY     RADICAL HYSTERECTOMY  2000   RIGHT HEART CATH N/A 05/22/2021   Procedure: RIGHT HEART CATH;  Surgeon: [place], Peter M, MD;  Location: [facility] INVASIVE CV LAB;  Service: Cardiovascular;  Laterality: N/A;   TONSILLECTOMY AND ADENOIDECTOMY      Patient Active Problem List   Diagnosis Date Noted   Other fatigue 03/13/2023   SOBOE (shortness of breath on exertion) 03/13/2023   Gout 03/13/2023   Iron deficiency 03/13/2023   Unspecified severe protein-calorie malnutrition (HCC) 03/13/2023   Depression screen 03/13/2023   Generalized obesity 03/13/2023   Obesity (BMI 30-39.9) 03/13/2023   Guillain Barr syndrome (HCC) 01/16/2023   Coronary artery disease involving native coronary artery of native heart without angina pectoris 08/24/2021   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   Elevated coronary artery calcium score 12/06/2020   Mixed hyperlipidemia 10/12/2020   Primary hypertension 01/19/2020   Chronic diastolic (congestive) heart failure (HCC) 01/19/2020   Dyspnea on exertion 11/05/2019   Hiatal hernia 11/05/2019   PSVT (paroxysmal supraventricular tachycardia) 01/01/2016   Thyroid nodule 04/14/2013   OSA on CPAP 04/06/2013   Daytime somnolence 04/06/2013    ONSET DATE: Referral: 01/16/2023 Onset: 12/16/22  REFERRING DIAG: G61.0 (ICD-10-CM) - Guillain Barr syndrome  THERAPY DIAG:  Other disturbances of skin sensation  Other symptoms and signs involving the nervous system  Rationale for Evaluation and Treatment: Rehabilitation  SUBJECTIVE:   SUBJECTIVE STATEMENT:  Patient reports the pool therapy continues to feel good to  her body, arms, hands etc.  Patient brought her bag of sensory items, fidgets and coordination activities.   Pt accompanied by: self  PERTINENT HISTORY:   MD report: 67 year old female evaluated for rapidly progressive ascending numbness and weakness.   Symptoms started around Dec 16, 2022.  Patient noticed a numbness sensation on her left forefoot and toes.  This progressed to the right foot and up her legs.  Within a week she had numbness and tingling and weakness in her hands up to her elbows.  She went to the emergency room twice for evaluation.  She was diagnosed with neuropathy  and was recommended to follow-up with neurology.   Of note patient had preceding upper respiratory infection symptoms Dec 12, 2022.  She also had RSV vaccination earlier in May.   Symptoms have plateaued since 01/04/23.  Using a walker at home.  She feels weakness in her hands, arms, legs.  No breathing issues.  No swallowing issues.  No prior similar symptoms.  PRECAUTIONS: No  WEIGHT BEARING RESTRICTIONS: No  PAIN:  Are you having pain? Yes: NPRS scale: 5/10 Pain location: Hands have improved/feet are worse Pain description: tingling - pins and needles (both hands and feet) Aggravating factors:  hard to determine Relieving factors: Gabapentin /Water therapy  FALLS: Has patient fallen in last 6 months? 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 Grooming: MI UB Dressing: 2 weeks ago - she could not fasten bra LB Dressing: Still feels stiff when putting on socks and is still slower at this task.  Reports she can tie her laces now. Toileting: MI Bathing: Sitting to bath due to being unsteady Tub Shower transfers: MI with walk in shower Equipment: Shower seat with back  IADLs: Shopping: Patient was initailly driven by her husband for about 3 weeks but is now  drving and returning to community outings/tasks on her own  Light housekeeping: Can help do her laundry now. Meal Prep: Husband does meal prep (previously did so also) Community mobility: Was using walker until about 6/5 - 01/17/23 - staying at home during that time Medication management: Returning to ability to open pill containers Financial management: Not asked. 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  COORDINATION:  9 Hole Peg test: Right: 23.05  sec; Left: 24.11  sec (Patient does reports  task feeling funny and awkward compared to normal).  SENSATION: Light touch: Impaired palmar surfaces are tingling even with different temperatures Diminished from wrist and below bilaterally for UEs palmar surfaces and below ankles   EDEMA: noted fingers being stiff and swollen and L toes  MUSCLE TONE: WFL  COGNITION: Overall cognitive status: Within functional limits for tasks assessed  VISION: Subjective report: H/O cataract removal.  Has different glasses for distance and for reading Baseline vision: Wears glasses for distance only and Wears glasses for reading only Visual history: cataracts  VISION ASSESSMENT: Not tested  Patient reports no difficulty with activities due to vision impairments  PERCEPTION: Not tested  PRAXIS: Not tested  EVALUATION OBSERVATIONS: Patient is a 67 y.o. female who was seen today for occupational therapy evaluation for UE numbness and tingling with resulting weakness and incoordination as a result of Guillain  Barre Syndrome. Hx includes recent dx of probable Guillain Barre Syndrome. Patient currently presents below baseline level of functioning demonstrating functional deficits and impairments as noted below. Pt would benefit from skilled OT services in the outpatient setting to work on impairments as noted below to help pt return to PLOF as able.       TODAY'S TREATMENT:                                                                                                                                  Therapeutic Activities:   Modality: for pain control and sensory relaxation.  Trialled TENS estim applied to R hand/wrist with 4x2 inch circle pads. Applied 10 minutes with NMES 20:10 - 35 Hz frequency, 480 puse width and intensity 5-2mA for both channels. Pt response: Changes in tingling sensation during application of estim ie) did not feel tingling in R hand compared to L hand.  But tingling returned upon removal of electrodes. Skin within normal limits to visual inspection before and after.     NMES Applied throughout UE coordination activities and education re ongoing desensitization activities.  Patient used various textured materials for hands and fingers for stimulation and reeducation of hands, distraction from sensory changes and changing activities as needed to minimize less desirable sensory changes.  Textures used for sensory stimulation today included different cloth textures (scrubby, washcloth, cotton etc) and other FM objects for coordination, tactile awareness etc  PATIENT EDUCATION: Education details: Trial of estim Person educated: Patient Education method: Explanation and Demonstration Education comprehension: verbalized understanding and needs further education  HOME EXERCISE PROGRAM: 02/12/23 Putty Exercises: Access Code: [email or web address] 02/19/23 Coordination Activities with Images 02/25/23 Desensitization Program handout 03/04/23 Progression of Putty Exercises: Access Code:  [email or web address]   GOALS: Goals reviewed with patient? Yes  SHORT TERM GOALS: Target date: 03/07/23  Patient will demonstrate UE strength/coordination HEP with 25% verbal cues or less for proper execution.  Baseline: To be provided Goal status: MET  2.  Patient will demonstrate improved sensory awareness/Stereognosis to identify different items in her  purse/pocket and different coins with vision occluded.  Baseline: TBA - Tingling & numbness in B palmar surfaces  Goal status: IN PROGRESS  3.  Patient will improve pinch strength/coordination & sensation of B hands to increase ease & independence in tying shoelaces, and manage buttons with vision occluded Baseline: Self reported difficulty. Goal status: IN PROGRESS   LONG TERM GOALS: Target date: 04/04/23  Patient will demonstrate MI with sensory stimulation HEP with MI  Baseline: To be provided. Goal status: MET  2.  Patient will demonstrate at least 30 lbs R UE grip strength and 25 lbs L UE grip strength as needed to open jars and other containers.  Baseline: Rt 22 lbs; Lt 18 lbs Goal status: IN PROGRESS  3.  Patient will demonstrate UE coordination to resume playing the ukulele  Baseline: Unable to move fingers for different cords by self report. Goal status: MET  ASSESSMENT:  CLINICAL IMPRESSION: Patient is a 67 y.o. female who returned today for OT today due to UE dysfunction due to neuropathy and resulting coordination and strength deficits due to Guillain Barre Syndrome affecting B hands (palmar surfaces especially). She trialled estim/NMES today for management of discomfort and sensory changes with some improvement during modality but limited carryover upon removal.  She has a wonderful bag of various items for her HEP for sensory stimulation and FM activities at home.  Pt will benefit from continued skilled OT services in the outpatient setting to complete education re: sensory and coordination deficits to help pt return to PLOF as  able.     PERFORMANCE DEFICITS: in functional skills including ADLs, coordination, dexterity, sensation, strength, flexibility, Fine motor control, decreased knowledge of precautions, and UE functional use.  IMPAIRMENTS: are limiting patient from ADLs, leisure, and social participation.   CO-MORBIDITIES: may have co-morbidities  that affects occupational performance. Patient will benefit from skilled OT to address above impairments and improve overall function.  REHAB POTENTIAL: Excellent  PLAN:  OT FREQUENCY: 1x/week  OT DURATION: 8 weeks  PLANNED INTERVENTIONS: self care/ADL training, therapeutic exercise, therapeutic activity, neuromuscular re-education, balance training, fluidotherapy, patient/family education, coping strategies training, and DME and/or AE instructions  RECOMMENDED OTHER SERVICES: PT treatments in place s/p eval on same day as OT.  CONSULTED AND AGREED WITH PLAN OF CARE: Patient  PLAN FOR NEXT SESSION:   Stereognosis to identify different items  Check ease with tying shoelaces, and manage buttons  Retest Grip strength (DC planning).  Review & progress sensory stimulation education, putty/coordination HEP ideas.  Modalities for neuropathy ie) trial fluidotherapy vs TENS/etim, [name].  Would heat options be effective also for home carryover.  Check on [place] - used jilbere de [place]   Victorino Sparrow, OT 03/18/2023, 12:15 PM

## 2023-03-18 NOTE — Progress Notes (Signed)
Chief Complaint:   OBESITY [name] (MR# [id number]) is a 67 y.o. female who presents for evaluation and treatment of obesity and related comorbidities. Current BMI is Body mass index is 32.96 kg/m. [name] has been struggling with her weight for many years and has been unsuccessful in either losing weight, maintaining weight loss, or reaching her healthy weight goal.  [name] is currently in the action stage of change and ready to dedicate time achieving and maintaining a healthier weight. [name] is interested in becoming our patient and working on intensive lifestyle modifications including (but not limited to) diet and exercise for weight loss.  Patient lives with her husband and 2 grown children.  She is a retired Education officer, museum.  She line dances once a week.  She drinks sweet tea, juice, and smoothies.  She over snacks.  Mood related over eating.  [name]'s habits were reviewed today and are as follows: her desired weight loss is 17 lbs, she started gaining weight 35-40 years, her heaviest weight ever was 203 pounds, she is a picky eater and doesn't like to eat healthier foods, she has significant food cravings issues, she snacks frequently in the evenings, she skips meals frequently, she is frequently drinking liquids with calories, she frequently makes poor food choices, she has problems with excessive hunger, she frequently eats larger portions than normal, she has binge eating behaviors, and she struggles with emotional eating.  Depression Screen [name]'s Food and Mood (modified PHQ-9) score was 13.  Subjective:   1. Other fatigue [name] admits to daytime somnolence and admits to waking up still tired. Patient has a history of symptoms of daytime fatigue and morning fatigue. [name] generally gets 8 hours of sleep per night, and states that she has nightime awakenings and generally restful sleep. Snoring is present. Apneic episodes are not present. Epworth Sleepiness Score is 13.   EKG was reviewed from 01/02/2023-sinus rhythm with 72 bpm, PACs, no ischemia.  2. SOBOE (shortness of breath on exertion) [name] complains of worsening shortness of breath. She has seen cardiology and pulmonology. She notes increasing shortness of breath with exercising and seems to be worsening over time with weight gain. She notes getting out of breath sooner with activity than she used to. This has not gotten worse recently. [name] denies shortness of breath at rest or orthopnea.  3. Mixed hyperlipidemia Patient is on pravastatin 40 mg nightly and Zetia 10 mg once daily.  Intolerant to rosuvastatin.  She denies myalgias.  4. Gout, unspecified cause, unspecified chronicity, unspecified site Patient takes allopurinol 100 mg once daily.  5. OSA on CPAP Patient averages 8 hours of sleep at night with CPAP.  Her Epworth score is 13.  6. Coronary artery disease involving native coronary artery of native heart without angina pectoris Patient notes mild nonobstructive sleep apnea.  She denies chest pain but notes shortness of breath.  Reviewed notes from Dr. Anne Fu from 12/06/2022.  She has pulmonary artery aneurysm, status post SVT ablation in 2017, and mild diastolic dysfunction on echocardiogram done on 01/29/2023.  7. Guillain Barr syndrome (HCC) Patient is averaging 3000 steps per day (used to be 7000 steps per day).  She was diagnosed in June 2024 with Dr. Marjory Lies. Patient has had IVIG and has neuropathy in PT and OT.  Possibly from RSV vaccine or URI.  8. Iron deficiency Patient is not taking iron supplementation.  9. Unspecified severe protein-calorie malnutrition (HCC) Patient has poor dietary intake with drop in metabolic  rate.  Assessment/Plan:   1. Other fatigue [name] does feel that her weight is causing her energy to be lower than it should be. Fatigue may be related to obesity, depression or many other causes. Labs will be ordered, and in the meanwhile, [name] will focus on self  care including making healthy food choices, increasing physical activity and focusing on stress reduction.  - Comprehensive metabolic panel - Vitamin B12 - CBC - TSH Rfx on Abnormal to Free T4 - Hemoglobin A1c - Insulin, random  2. SOBOE (shortness of breath on exertion) [name] does feel that she gets out of breath more easily that she used to when she exercises. [name]'s shortness of breath appears to be obesity related and exercise induced. She has agreed to work on weight loss and gradually increase exercise to treat her exercise induced shortness of breath. Will continue to monitor closely.  3. Mixed hyperlipidemia We will check labs today.  Patient is to look for improvements in hyperlipidemia with weight loss.  - Lipid panel  4. Gout, unspecified cause, unspecified chronicity, unspecified site Patient is to watch for gout flare with weight loss.  5. OSA on CPAP Patient is to wear her CPAP 7 to 8 hours and begin her active plan for weight loss.  6. Coronary artery disease involving native coronary artery of native heart without angina pectoris Patient will consider the use of Wegovy.  7. Guillain Barr syndrome (HCC) We will follow-up at patient's next visit.   8. Iron deficiency We will check labs today, and we will follow-up at patient's next visit.  - Folate - Ferritin - Iron and TIBC  9. Unspecified severe protein-calorie malnutrition (HCC) We will check labs today.  Patient will begin her meal plan, and we will follow-up at her next visit.  - VITAMIN D 25 Hydroxy (Vit-D Deficiency, Fractures)  10. Depression screen [name] had a positive depression screening. Depression is commonly associated with obesity and often results in emotional eating behaviors. We will monitor this closely and work on CBT to help improve the non-hunger eating patterns. Referral to Psychology may be required if no improvement is seen as she continues in our clinic.  11. Obesity (BMI  30-39.9)  12. Generalized obesity with starting BMI 33.1 [name] is currently in the action stage of change and her goal is to continue with weight loss efforts. I recommend [name] begin the structured treatment plan as follows:  She has agreed to the Category 2 Plan.  100-calorie snack list was given.  Allow Special K protein and fair life milk as breakfast option.  Exercise goals: All adults should avoid inactivity. Some physical activity is better than none, and adults who participate in any amount of physical activity gain some health benefits.   Behavioral modification strategies: increasing lean protein intake, decreasing simple carbohydrates, increasing vegetables, increasing water intake, decreasing liquid calories, decreasing eating out, no skipping meals, keeping healthy foods in the home, better snacking choices, avoiding temptations, and planning for success.  She was informed of the importance of frequent follow-up visits to maximize her success with intensive lifestyle modifications for her multiple health conditions. She was informed we would discuss her lab results at her next visit unless there is a critical issue that needs to be addressed sooner. [name] agreed to keep her next visit at the agreed upon time to discuss these results.  Objective:   Blood pressure 138/82, pulse 85, temperature 98.2 F (36.8 C), height 5\' 4"  (1.626 m), weight 192 lb (87.1  kg), SpO2 96%. Body mass index is 32.96 kg/m.  EKG: Normal sinus rhythm, rate 72 BPM.  Indirect Calorimeter completed today shows a VO2 of 214 and a REE of 1469.  Her calculated basal metabolic rate is 3875 thus her basal metabolic rate is worse than expected.  General: Cooperative, alert, well developed, in no acute distress. HEENT: Conjunctivae and lids unremarkable. Cardiovascular: Regular rhythm.  Lungs: Normal work of breathing. Neurologic: No focal deficits.   Lab Results  Component Value Date   CREATININE 0.98  03/13/2023   BUN 17 03/13/2023   NA 141 03/13/2023   K 4.5 03/13/2023   CL 104 03/13/2023   CO2 21 03/13/2023   Lab Results  Component Value Date   ALT 20 03/13/2023   AST 24 03/13/2023   ALKPHOS 52 03/13/2023   BILITOT 0.6 03/13/2023   Lab Results  Component Value Date   HGBA1C 5.3 03/13/2023   Lab Results  Component Value Date   INSULIN 34.0 (H) 03/13/2023   Lab Results  Component Value Date   TSH 0.901 03/13/2023   Lab Results  Component Value Date   CHOL 144 03/13/2023   HDL 52 03/13/2023   LDLCALC 73 03/13/2023   TRIG 105 03/13/2023   CHOLHDL 2.8 03/13/2023   Lab Results  Component Value Date   WBC 6.8 03/13/2023   HGB 14.6 03/13/2023   HCT 46.9 (H) 03/13/2023   MCV 88 03/13/2023   PLT 219 03/13/2023   Lab Results  Component Value Date   IRON 77 03/13/2023   TIBC 343 03/13/2023   FERRITIN 59 03/13/2023   Attestation Statements:   Reviewed by clinician on day of visit: allergies, medications, problem list, medical history, surgical history, family history, social history, and previous encounter notes.  Time spent on visit including pre-visit chart review and post-visit charting and care was 45 minutes.   Trude Mcburney, am acting as transcriptionist for Seymour Bars, DO.  I have reviewed the above documentation for accuracy and completeness, and I agree with the above. Seymour Bars, DO

## 2023-03-25 ENCOUNTER — Ambulatory Visit: Payer: Medicare Other | Admitting: Physical Therapy

## 2023-03-25 ENCOUNTER — Ambulatory Visit: Payer: Medicare Other | Admitting: Occupational Therapy

## 2023-03-25 ENCOUNTER — Emergency Department (HOSPITAL_BASED_OUTPATIENT_CLINIC_OR_DEPARTMENT_OTHER)
Admission: EM | Admit: 2023-03-25 | Discharge: 2023-03-25 | Disposition: A | Discharge status: Home / Self Care | Payer: Medicare Other | Attending: Emergency Medicine | Admitting: Emergency Medicine

## 2023-03-25 ENCOUNTER — Other Ambulatory Visit: Payer: Self-pay

## 2023-03-25 ENCOUNTER — Encounter (HOSPITAL_BASED_OUTPATIENT_CLINIC_OR_DEPARTMENT_OTHER): Payer: Self-pay

## 2023-03-25 DIAGNOSIS — I509 Heart failure, unspecified: Secondary | ICD-10-CM | POA: Diagnosis not present

## 2023-03-25 DIAGNOSIS — K5900 Constipation, unspecified: Secondary | ICD-10-CM | POA: Diagnosis present

## 2023-03-25 DIAGNOSIS — I110 Hypertensive heart disease with heart failure: Secondary | ICD-10-CM | POA: Insufficient documentation

## 2023-03-25 DIAGNOSIS — Z79899 Other long term (current) drug therapy: Secondary | ICD-10-CM | POA: Insufficient documentation

## 2023-03-25 DIAGNOSIS — Z7982 Long term (current) use of aspirin: Secondary | ICD-10-CM | POA: Insufficient documentation

## 2023-03-25 NOTE — ED Provider Notes (Signed)
[place] [facility] Provider Note   CSN: [id number] Arrival date & time: 03/25/23  1725     History  Chief Complaint  Patient presents with   Constipation    [name] is a 67 y.o. female.   Constipation Patient presents constipation.  States no bowel movement today and last bowel movement may have been 3 days ago.  Tried an enema without relief.  No real abdominal pain but states it feels if she has to go but cannot.  States there is fullness in her rear end.  No fevers.  Recently saw a weight loss clinic and states that they changed her diet to high-protein and has been having issues since.  Previously had been on MiraLAX to help treat diarrhea.    Past Medical History:  Diagnosis Date   ADD (attention deficit disorder)    Anxiety    Back pain    CHF (congestive heart failure) (HCC)    Constipation    Daytime somnolence 04/06/2013   DDD (degenerative disc disease), lumbar    Depression    Depression    GERD (gastroesophageal reflux disease)    Hypercholesteremia    Hypertension    IBS (irritable bowel syndrome)    Joint pain    OSA on CPAP 04/06/2013   Palpitations    Sleep apnea    Swelling of both lower extremities    Thyroid condition    Urinary incontinence     Home Medications Prior to Admission medications   Medication Sig Start Date End Date Taking? Authorizing Provider  allopurinol (ZYLOPRIM) 100 MG tablet Take 100 mg by mouth daily. 09/09/22   [provider]  aspirin EC 81 MG tablet Take 1 tablet (81 mg total) by mouth daily. SWALLOW WHOLE. 04/23/22   Jake Bathe, MD  cetirizine (ZYRTEC) 10 MG tablet Take 10 mg by mouth in the morning.    [provider]  Cholecalciferol (VITAMIN D3) 50 MCG (2000 UT) TABS Take 2,000 Units by mouth in the morning.    [provider]  clobetasol (OLUX) 0.05 % topical foam Apply 1 Application topically 2 (two) times daily. 01/28/23   [provider]  Colchicine (COLCRYS PO) Take 0.6 mg by mouth. Was twice daily, now once daily, then taper    [provider]  ezetimibe (ZETIA) 10 MG tablet TAKE 1 TABLET BY MOUTH EVERY DAY 12/13/22   Jake Bathe, MD  fluticasone (FLONASE) 50 MCG/ACT nasal spray Place 2 sprays into both nostrils daily as needed for allergies.    [provider]  gabapentin (NEURONTIN) 100 MG capsule Take 1 capsule (100 mg total) by mouth 3 (three) times daily. 01/29/23   Penumalli, Glenford Bayley, MD  pravastatin (PRAVACHOL) 40 MG tablet TAKE 1 TABLET BY MOUTH EVERY DAY IN THE EVENING 07/25/22   Jake Bathe, MD  spironolactone (ALDACTONE) 50 MG tablet TAKE 1 TABLET BY MOUTH EVERY DAY 07/25/22   Jake Bathe, MD  venlafaxine XR (EFFEXOR-XR) 75 MG 24 hr capsule Take 37.5 mg by mouth daily. 12/04/22   [provider]      Allergies    Other and Rosuvastatin    Review of Systems   Review of Systems  Gastrointestinal:  Positive for constipation.    Physical Exam Updated Vital Signs BP (!) 151/95 (BP Location: Left Arm)   Pulse 83   Temp 97.9 F (36.6 C)   Resp 15   Ht 5\' 4"  (  1.626 m)   Wt 87.1 kg   SpO2 96%   BMI 32.96 kg/m  Physical Exam Vitals and nursing note reviewed.  Abdominal:     General: There is no distension.     Tenderness: There is no rebound.  Genitourinary:    Comments: Some rectal impaction with relatively soft stool.  Disimpaction done. Neurological:     Mental Status: She is alert.     ED Results / Procedures / Treatments   Labs (all labs ordered are listed, but only abnormal results are displayed) Labs Reviewed - No data to display  EKG None  Radiology No results found.  Procedures Procedures    Medications Ordered in ED Medications - No data to display  ED Course/ Medical Decision Making/ A&P                                 Medical Decision Making  Patient with constipation.  Has had some irritable bowel in the past.  Rectal exam  done and showed firm stool although was broken up by me.  Attempted bowel movement but really has not had much improvement.  Discussed about possible enema here.  Patient would rather go home for oral treatment.  I think this is reasonable.  Benign abdominal exam.  Will discharge.  Has taken MiraLAX in the past and will take this.        Final Clinical Impression(s) / ED Diagnoses Final diagnoses:  Constipation, unspecified constipation type    Rx / DC Orders ED Discharge Orders     None         Benjiman Core, MD 03/25/23 2358

## 2023-03-25 NOTE — Discharge Instructions (Addendum)
You can take the miralax to help with constipation.  You can take 1-2 doses of it a day.  Watch for increasing abdominal pain or vomiting.

## 2023-03-25 NOTE — ED Triage Notes (Addendum)
Patient here POV from Home.  Endorses Constipation since 0930. Last normal BM was 3 Days ago. Seeks Disimpaction if necessary. Recently began a high protein diet.   Fleet Enema without relief. Small trace blood noted at times. No N/V/D. No Known Fevers. No Dysuria. No ABD Pain.   NAD Noted during Triage. A&Ox4. GCS 15. Ambulatory. Painful to sit due to rectal pain.

## 2023-03-26 ENCOUNTER — Encounter (INDEPENDENT_AMBULATORY_CARE_PROVIDER_SITE_OTHER): Payer: Self-pay | Admitting: Family Medicine

## 2023-03-26 ENCOUNTER — Ambulatory Visit (INDEPENDENT_AMBULATORY_CARE_PROVIDER_SITE_OTHER): Payer: Medicare Other | Admitting: Family Medicine

## 2023-03-26 VITALS — BP 138/70 | HR 91 | Temp 98.0°F | Ht 64.0 in | Wt 188.0 lb

## 2023-03-26 DIAGNOSIS — E782 Mixed hyperlipidemia: Secondary | ICD-10-CM | POA: Diagnosis not present

## 2023-03-26 DIAGNOSIS — E559 Vitamin D deficiency, unspecified: Secondary | ICD-10-CM | POA: Diagnosis not present

## 2023-03-26 DIAGNOSIS — I10 Essential (primary) hypertension: Secondary | ICD-10-CM

## 2023-03-26 DIAGNOSIS — E88819 Insulin resistance, unspecified: Secondary | ICD-10-CM

## 2023-03-26 DIAGNOSIS — E669 Obesity, unspecified: Secondary | ICD-10-CM

## 2023-03-26 DIAGNOSIS — Z6832 Body mass index (BMI) 32.0-32.9, adult: Secondary | ICD-10-CM

## 2023-03-26 MED ORDER — VITAMIN D (ERGOCALCIFEROL) 1.25 MG (50000 UNIT) PO CAPS
50000.0000 [IU] | ORAL_CAPSULE | ORAL | 1 refills | Status: DC
Start: 2023-03-26 — End: 2023-04-24

## 2023-03-26 NOTE — Progress Notes (Signed)
[name], D.O.  ABFM, ABOM Clinical Bariatric Medicine Physician  Office located at: [address] [place], [place]  [address]     Assessment and Plan:   Meds ordered this encounter  Medications   Vitamin D, Ergocalciferol, (DRISDOL) 1.25 MG (50000 UNIT) CAPS capsule    Sig: Take 1 capsule (50,000 Units total) by mouth every 7 (seven) days.    Dispense:  4 capsule    Refill:  1    Insulin resistance Assessment:  Goal is HgbA1c < 5.7, fasting insulin closer to 5. Condition is Not at goal.. This is diet/exercise controlled. Her A1c level is stable at 5.3 but her insulin is highly elevated at 34.0. Her CBC, CMP, thyroid, and iron levels are within the recommended optimal range. Her B-12 level is mildly low at 476 and she does not take any vitamin B12 supplements or multivitamins.  Lab Results  Component Value Date   HGBA1C 5.3 03/13/2023   INSULIN 34.0 (H) 03/13/2023   Lab Results  Component Value Date   WBC 6.8 03/13/2023   HGB 14.6 03/13/2023   HCT 46.9 (H) 03/13/2023   MCV 88 03/13/2023   PLT 219 03/13/2023   Lab Results  Component Value Date   CREATININE 0.98 03/13/2023   BUN 17 03/13/2023   NA 141 03/13/2023   K 4.5 03/13/2023   CL 104 03/13/2023   CO2 21 03/13/2023      Component Value Date/Time   PROT 7.4 03/13/2023 1109   ALBUMIN 4.6 03/13/2023 1109   AST 24 03/13/2023 1109   ALT 20 03/13/2023 1109   ALKPHOS 52 03/13/2023 1109   BILITOT 0.6 03/13/2023 1109   BILIDIR 0.15 11/14/2021 0912   Lab Results  Component Value Date   TSH 0.901 03/13/2023   Component Ref Range & Units 13 d ago  Folate >3.0 ng/mL >20.0      Component Ref Range & Units 13 d ago  Ferritin 15 - 150 ng/mL 59      Component Ref Range & Units 13 d ago  Total Iron Binding Capacity 250 - 450 ug/dL 865  UIBC 784 - 696 ug/dL 295  Iron 27 - 284 ug/dL 77  Iron Saturation 15 - 55 % 22   Lab Results  Component Value Date   VITAMINB12 476 03/13/2023   Plan:  - She will continue to focus on protein-rich, low simple carbohydrate foods. We reviewed the importance of hydration, regular exercise for stress reduction, and restorative sleep to help decrease the risk of progressing to diabetes.   - Explained role of simple carbs and insulin levels on hunger and cravings  - Anticipatory guidance given.    - We will recheck A1c and fasting insulin level in approximately 3 months from last check, or as deemed appropriate.   Labs were reviewed with patient today and education provided on them. We discussed how the foods patient eats may influence these laboratory findings.  All of the patient's questions about them were answered    Mixed hyperlipidemia Assessment: Pt continues Zetia and Pravastatin daily without difficulty. She tolerates these well and denies any adverse side effects.  Lab Results  Component Value Date   CHOL 144 03/13/2023   HDL 52 03/13/2023   LDLCALC 73 03/13/2023   TRIG 105 03/13/2023   CHOLHDL 2.8 03/13/2023   Plan: - Zetia 10mg  and Pravastatin  40mg  as directed.   Aura Camps agrees to continue with meds and/or our treatment plan  of a heart-heathy, low cholesterol meal plan low in saturated and trans fats, and low in fatty carbs.  - We recommend: aerobic activity with eventual goal of a minimum of 150+ min wk plus 2 days/ week of resistance or strength training.    - We will continue routine screening as patient continues to achieve health goals along their weight loss journey  Labs were reviewed with patient today and education provided on them. We discussed how the foods patient eats may influence these laboratory findings.  All of the patient's questions about them were answered     HTN Assessment: Condition is Controlled. Today. Her last 2 readings were elevated. This is controlled with Aldactone 50mg  daily. She tolerates this well and denies any adverse side effects.  Last 3 blood pressure readings in our office  are as follows: BP Readings from Last 3 Encounters:  03/26/23 138/70  03/25/23 (!) 151/95  03/18/23 (!) 144/86    Plan:- Continue Aldactone at current dose as directed by her cardiologist.   Dorothe Pea Basley BP is 138/70 at goal today.   - Counseled Aura Camps on pathophysiology of disease and discussed treatment plan, which always includes dietary and lifestyle modification as first line. Lifestyle changes such as following our low salt, heart healthy meal plan and engaging in a regular exercise program discussed   - Ambulatory blood pressure monitoring encouraged.    - We will continue to monitor closely alongside PCP/ specialists.    - Unless pre-existing renal or cardiopulmonary conditions exist which patient was told to limit their fluid intake by another provider, I recommended roughly one half of their weight in pounds, to be the approximate ounces of non-caloric, non-caffeinated beverages they should drink per day; including more if they are engaging in exercise.   Vitamin D deficiency- new diagnosis Assessment: Condition is Not at goal.. Pt vitamin D levels are below the recommended range at 28.6. She denies taking any prescription or OTC supplements. Lab Results  Component Value Date   VD25OH 28.6 (L) 03/13/2023   Plan:- Begin Ergocalciferol 50K IU weekly.   - I discussed the importance of vitamin D to the patient's health and well-being as well as to their ability to lose weight.   - I reviewed possible symptoms of low Vitamin D:  low energy, depressed mood, muscle aches, joint aches, osteoporosis etc. with patient  - ideal vitamin D levels reviewed with patient    - weight loss will likely improve availability of vitamin D, thus encouraged Ayaana to continue with meal plan and their weight loss efforts to further improve this condition.  Thus, we will need to monitor levels regularly (every 3-4 mo on average) to keep levels within normal limits and prevent over  supplementation.  Labs were reviewed with patient today and education provided on them. We discussed how the foods patient eats may influence these laboratory findings.  All of the patient's questions about them were answered    TREATMENT PLAN FOR OBESITY: Generalized obesity with starting BMI 33.1- Current BMI: 32.25  Obesity (BMI 30-39.9) Assessment:  NARJES BYFORD is here to discuss her progress with her obesity treatment plan along with follow-up of her obesity related diagnoses. See Medical Weight Management Flowsheet for complete bioelectrical impedance results.  Condition is not optimized.   Since last office visit patient's  Muscle mass has decreased by 0.4lb. Fat mass has decreased by 4.4lb. Total body water has decreased by 0.6lb.  Counseling done on how various  foods will affect these numbers and how to maximize success  Total lbs lost to date: 4 Total weight loss percentage to date: 2..0%   Plan:  Continue to follow the Category 2 meal plan with Breakfast and Lunch options best they can.   - I advised her of Premier Protein, Artist, Magic Spoon, and Wheaties cereal as healthy options since she likes cereal.   Behavioral Intervention Additional resources provided today: breakfast options and lunch options Evidence-based interventions for health behavior change were utilized today including the discussion of self monitoring techniques, problem-solving barriers and SMART goal setting techniques.   Regarding patient's less desirable eating habits and patterns, we employed the technique of small changes.  Pt will specifically work on: Following the prescribed meal plan for next visit.   FOLLOW UP: Return in about 3 days (around 03/29/2023). She was informed of the importance of frequent follow up visits to maximize her success with intensive lifestyle modifications for her multiple health conditions.  Subjective:   Chief complaint: Obesity Veneda is here to discuss  her progress with her obesity treatment plan. She is on the the Category 2 Plan and states she is following her eating plan approximately 75 % of the time. She states she is dancing 60 minutes 2 days per week and doing water aerobics 60 minutes 1 day a week.   Interval History:  AYMAR SHVARTS is here today for her first follow-up office visit since starting the program with [name].  Since last office visit she she met with Dr. Cathey Endow. She mentions starting the meal plan put her in a state of shock and thought the meal plan was a big change from what she eats. However, she did start the meal plan and at the "same thing" for 2 weeks. Before she started the meal plan she typically ate sugary cereal for breakfast and sandwiches/pasta and sauce for lunch. She drinks about 40oz of water daily. She informed me that was admitted into the ED due to an impacted bowel, she notes no medication change just diet change and believes this to be the cause. She informed me that she weighs herself daily and hasn't been weighing her food intake.    All blood work/ lab tests that were recently ordered by myself or an outside provider were reviewed with patient today per their request. Extended time was spent counseling her on all new disease processes that were discovered or preexisting ones that are affected by BMI.  she understands that many of these abnormalities will need to monitored regularly along with the current treatment plan of prudent dietary changes, in which we are making each and every office visit, to improve these health parameters.   Review of Systems:  Pertinent positives were addressed with patient today.  Weight Summary and Biometrics   Weight Lost Since Last Visit: 4lb  Weight Gained Since Last Visit: 0lb   Vitals Temp: 98 F (36.7 C) BP: 138/70 Pulse Rate: 91 SpO2: 95 %   Anthropometric Measurements Height: 5\' 4"  (1.626 m) Weight: 188 lb (85.3 kg) BMI (Calculated): 32.25 Weight at Last  Visit: 192lb Weight Lost Since Last Visit: 4lb Weight Gained Since Last Visit: 0lb Starting Weight: 192lb Total Weight Loss (lbs): 4 lb (1.814 kg) Peak Weight: 202lb   Body Composition  Body Fat %: 42.2 % Fat Mass (lbs): 79.4 lbs Muscle Mass (lbs): 103 lbs Total Body Water (lbs): 69.2 lbs Visceral Fat Rating : 12   Other Clinical Data Fasting: no Labs:  no Today's Visit #: 2 Starting Date: 03/13/23     Objective:   PHYSICAL EXAM:  Blood pressure 138/70, pulse 91, temperature 98 F (36.7 C), height 5\' 4"  (1.626 m), weight 188 lb (85.3 kg), SpO2 95%. Body mass index is 32.27 kg/m.  General: Well Developed, well nourished, and in no acute distress.  HEENT: Normocephalic, atraumatic Skin: Warm and dry, cap RF less 2 sec, good turgor Chest:  Normal excursion, shape, no gross abn Respiratory: speaking in full sentences, no conversational dyspnea NeuroM-Sk: Ambulates w/o assistance, moves * 4 Psych: A and O *3, insight good, mood-full  DIAGNOSTIC DATA REVIEWED:  BMET    Component Value Date/Time   NA 141 03/13/2023 1109   K 4.5 03/13/2023 1109   CL 104 03/13/2023 1109   CO2 21 03/13/2023 1109   GLUCOSE 88 03/13/2023 1109   GLUCOSE 152 (H) 01/02/2023 1200   BUN 17 03/13/2023 1109   CREATININE 0.98 03/13/2023 1109   CREATININE 0.84 12/27/2015 0833   CALCIUM 10.0 03/13/2023 1109   GFRNONAA >60 01/02/2023 1200   GFRAA 38 (L) 09/29/2015 1553   Lab Results  Component Value Date   HGBA1C 5.3 03/13/2023   Lab Results  Component Value Date   INSULIN 34.0 (H) 03/13/2023   Lab Results  Component Value Date   TSH 0.901 03/13/2023   CBC    Component Value Date/Time   WBC 6.8 03/13/2023 1109   WBC 8.6 01/02/2023 1200   RBC 5.34 (H) 03/13/2023 1109   RBC 5.63 (H) 01/02/2023 1200   HGB 14.6 03/13/2023 1109   HCT 46.9 (H) 03/13/2023 1109   PLT 219 03/13/2023 1109   MCV 88 03/13/2023 1109   MCH 27.3 03/13/2023 1109   MCH 27.2 01/02/2023 1200   MCHC 31.1 (L)  03/13/2023 1109   MCHC 30.9 01/02/2023 1200   RDW 13.3 03/13/2023 1109   Iron Studies    Component Value Date/Time   IRON 77 03/13/2023 1109   TIBC 343 03/13/2023 1109   FERRITIN 59 03/13/2023 1109   IRONPCTSAT 22 03/13/2023 1109   Lipid Panel     Component Value Date/Time   CHOL 144 03/13/2023 1109   TRIG 105 03/13/2023 1109   HDL 52 03/13/2023 1109   CHOLHDL 2.8 03/13/2023 1109   LDLCALC 73 03/13/2023 1109   Hepatic Function Panel     Component Value Date/Time   PROT 7.4 03/13/2023 1109   ALBUMIN 4.6 03/13/2023 1109   AST 24 03/13/2023 1109   ALT 20 03/13/2023 1109   ALKPHOS 52 03/13/2023 1109   BILITOT 0.6 03/13/2023 1109   BILIDIR 0.15 11/14/2021 0912      Component Value Date/Time   TSH 0.901 03/13/2023 1109   TSH 2.070 10/30/2020 1417   Nutritional Lab Results  Component Value Date   VD25OH 28.6 (L) 03/13/2023    Attestations:   Reviewed by clinician on day of visit: allergies, medications, problem list, medical history, surgical history, family history, social history, and previous encounter notes.   Patient was in the office today and time spent on visit including pre-visit chart review and post-visit care/coordination of care and electronic medical record documentation was 60 minutes. 50% of the time was in face to face counseling of this patient's medical condition(s) and providing education on treatment options to include the first-line treatment of diet and lifestyle modification.  I, Clinical biochemist, acting as a Stage manager for [employer], DO., have compiled all relevant documentation for today's office visit on behalf of  Thomasene Lot, DO, while in the presence of Thomasene Lot, DO.  I have reviewed the above documentation for accuracy and completeness, and I agree with the above. [name], D.O.  The 21st Century Cures Act was signed into law in 2016 which includes the topic of electronic health records.  This provides immediate  access to information in MyChart.  This includes consultation notes, operative notes, office notes, lab results and pathology reports.  If you have any questions about what you read please let us know at your next visit so we can discuss your concerns and take corrective action if need be.  We are right here with you.

## 2023-03-28 ENCOUNTER — Encounter (INDEPENDENT_AMBULATORY_CARE_PROVIDER_SITE_OTHER): Payer: Self-pay | Admitting: Family Medicine

## 2023-04-01 ENCOUNTER — Ambulatory Visit: Payer: Medicare Other | Admitting: Occupational Therapy

## 2023-04-01 ENCOUNTER — Ambulatory Visit: Payer: Medicare Other | Attending: Diagnostic Neuroimaging | Admitting: Physical Therapy

## 2023-04-01 ENCOUNTER — Encounter: Payer: Self-pay | Admitting: Physical Therapy

## 2023-04-01 ENCOUNTER — Telehealth (INDEPENDENT_AMBULATORY_CARE_PROVIDER_SITE_OTHER): Payer: Self-pay | Admitting: Family Medicine

## 2023-04-01 ENCOUNTER — Encounter (INDEPENDENT_AMBULATORY_CARE_PROVIDER_SITE_OTHER): Payer: Self-pay

## 2023-04-01 VITALS — BP 134/76 | HR 59

## 2023-04-01 DIAGNOSIS — R29818 Other symptoms and signs involving the nervous system: Secondary | ICD-10-CM | POA: Insufficient documentation

## 2023-04-01 DIAGNOSIS — R208 Other disturbances of skin sensation: Secondary | ICD-10-CM | POA: Insufficient documentation

## 2023-04-01 DIAGNOSIS — M6281 Muscle weakness (generalized): Secondary | ICD-10-CM | POA: Insufficient documentation

## 2023-04-01 DIAGNOSIS — R278 Other lack of coordination: Secondary | ICD-10-CM | POA: Diagnosis present

## 2023-04-01 DIAGNOSIS — R2689 Other abnormalities of gait and mobility: Secondary | ICD-10-CM | POA: Diagnosis present

## 2023-04-01 DIAGNOSIS — R2681 Unsteadiness on feet: Secondary | ICD-10-CM | POA: Diagnosis present

## 2023-04-01 NOTE — Therapy (Signed)
OUTPATIENT OCCUPATIONAL THERAPY NEURO TREATMENT & PROGRESS NOTE  Patient Name: [name] MRN: [id number] DOB:June 28, 1956, 67 y.o., female Today's Date: 04/01/2023  PCP: Chilton Greathouse, MD REFERRING PROVIDER: Suanne Marker, MD  END OF SESSION:  OT End of Session - 04/01/23 1057     Visit Number 8    Number of Visits 12   + evaluation   Date for OT Re-Evaluation 05/09/23    Authorization Type MEDICARE PART A AND B    Progress Note Due on Visit 12    OT Start Time 1100    OT Stop Time 1145    OT Time Calculation (min) 45 min    Equipment Utilized During Treatment E stim/TENS    Activity Tolerance Patient tolerated treatment well    Behavior During Therapy WFL for tasks assessed/performed             Past Medical History:  Diagnosis Date   ADD (attention deficit disorder)    Anxiety    Back pain    CHF (congestive heart failure) (HCC)    Constipation    Daytime somnolence 04/06/2013   DDD (degenerative disc disease), lumbar    Depression    Depression    GERD (gastroesophageal reflux disease)    Hypercholesteremia    Hypertension    IBS (irritable bowel syndrome)    Joint pain    OSA on CPAP 04/06/2013   Palpitations    Sleep apnea    Swelling of both lower extremities    Thyroid condition    Urinary incontinence    Past Surgical History:  Procedure Laterality Date   ABLATION  09/29/2017   BACK SURGERY  04/06/2019   BREAST EXCISIONAL BIOPSY Left    CATARACT EXTRACTION Bilateral    ELECTROPHYSIOLOGIC STUDY N/A 01/01/2016   Procedure: SVT Ablation;  Surgeon: Marinus Maw, MD;  Location: [facility] INVASIVE CV LAB;  Service: Cardiovascular;  Laterality: N/A;   HAND SURGERY Right    KNEE ARTHROPLASTY Left    NASAL SEPTUM SURGERY     RADICAL HYSTERECTOMY  2000   RIGHT HEART CATH N/A 05/22/2021   Procedure: RIGHT HEART CATH;  Surgeon: Swaziland, Peter M, MD;  Location: [facility] INVASIVE CV LAB;  Service: Cardiovascular;  Laterality: N/A;   TONSILLECTOMY AND  ADENOIDECTOMY     Patient Active Problem List   Diagnosis Date Noted   Vitamin D deficiency 03/26/2023   Other fatigue 03/13/2023   SOBOE (shortness of breath on exertion) 03/13/2023   Gout 03/13/2023   Iron deficiency 03/13/2023   Unspecified severe protein-calorie malnutrition (HCC) 03/13/2023   Depression screen 03/13/2023   Generalized obesity 03/13/2023   Obesity (BMI 30-39.9) 03/13/2023   Guillain Barr syndrome (HCC) 01/16/2023   Coronary artery disease involving native coronary artery of native heart without angina pectoris 08/24/2021   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   Elevated coronary artery calcium score 12/06/2020   Mixed hyperlipidemia 10/12/2020   Primary hypertension 01/19/2020   Chronic diastolic (congestive) heart failure (HCC) 01/19/2020   Dyspnea on exertion 11/05/2019   Hiatal hernia 11/05/2019   PSVT (paroxysmal supraventricular tachycardia) 01/01/2016   Thyroid nodule 04/14/2013   OSA on CPAP 04/06/2013   Daytime somnolence 04/06/2013    ONSET DATE: Referral: 01/16/2023 Onset: 12/16/22  REFERRING DIAG: G61.0 (ICD-10-CM) - Guillain Barr syndrome  THERAPY DIAG:  Other disturbances of skin sensation  Other symptoms and signs involving the nervous system  Muscle weakness (generalized)  Other lack of coordination  Rationale for Evaluation and Treatment:  Rehabilitation  SUBJECTIVE:   SUBJECTIVE STATEMENT:  Patient reports effects of NMES from last visit were positive re: minimizing tingling during application although this returned but overall it seemed to make the most improvement in the numbness in the palmar region of the R hand compared to the L hand.  She was interested in trying it again and was hoping to be able to pursue possibly obtaining one for home use.   Pt accompanied by: self  PERTINENT HISTORY:   MD report: 67 year old female evaluated for rapidly progressive ascending numbness and weakness.   Symptoms started around  Dec 16, 2022.  Patient noticed a numbness sensation on her left forefoot and toes.  This progressed to the right foot and up her legs.  Within a week she had numbness and tingling and weakness in her hands up to her elbows.  She went to the emergency room twice for evaluation.  She was diagnosed with neuropathy and was recommended to follow-up with neurology.   Of note patient had preceding upper respiratory infection symptoms Dec 12, 2022.  She also had RSV vaccination earlier in May.   Symptoms have plateaued since 01/04/23.  Using a walker at home.  She feels weakness in her hands, arms, legs.  No breathing issues.  No swallowing issues.  No prior similar symptoms.  PRECAUTIONS: No  WEIGHT BEARING RESTRICTIONS: No  PAIN:  Are you having pain? Yes: NPRS scale: 5/10 Pain location: Hands have improved/feet are worse Pain description: tingling - pins and needles (both hands and feet) Aggravating factors:  hard to determine Relieving factors: Gabapentin /Water therapy  FALLS: Has patient fallen in last 6 months? 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 Grooming: MI UB Dressing: 2 weeks ago - she could not fasten bra LB Dressing: Still feels stiff when putting on  socks and is still slower at this task.  Reports she can tie her laces now. Toileting: MI Bathing: Sitting to bath due to being unsteady Tub Shower transfers: MI with walk in shower Equipment: Shower seat with back  IADLs: Shopping: Patient was initailly driven by her husband for about 3 weeks but is now drving and returning to community outings/tasks on her own  Light housekeeping: Can help do her laundry now. Meal Prep: Husband does meal prep (previously did so also) Community mobility: Was using walker until about 6/5 - 01/17/23 - staying at home during that time Medication management: Returning to ability to open pill containers Financial management: Not asked. 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  COORDINATION:  9 Hole Peg test: Right: 23.05  sec; Left: 24.11  sec (Patient does reports task feeling funny and awkward compared to normal).  SENSATION: Light touch: Impaired palmar surfaces are tingling even with different temperatures Diminished from wrist and below bilaterally for UEs palmar surfaces and below ankles   EDEMA: noted fingers being stiff and swollen and L toes  MUSCLE TONE: WFL  COGNITION: Overall cognitive status: Within functional limits for tasks assessed  VISION: Subjective report: H/O cataract removal.  Has different glasses for distance and for reading Baseline vision: Wears glasses for distance only and Wears glasses for reading only Visual history: cataracts  VISION ASSESSMENT: Not tested  Patient reports no  difficulty with activities due to vision impairments  PERCEPTION: Not tested  PRAXIS: Not tested  EVALUATION OBSERVATIONS: Patient is a 67 y.o. female who was seen today for occupational therapy evaluation for UE numbness and tingling with resulting weakness and incoordination as a result of Guillain Barre Syndrome. Hx includes recent dx of probable Guillain Barre Syndrome. Patient currently presents below baseline level of functioning demonstrating functional deficits and impairments as noted below. Pt would benefit from skilled OT services in the outpatient setting to work on impairments as noted below to help pt return to PLOF as able.       TODAY'S TREATMENT:                                                                                                                                  Therapeutic Activities:   Modality: for pain control and sensory relaxation.  Trialled TENS estim applied to L hand/wrist with 4x2 inch circle pads. Applied 10 minutes with TENS pre-programmed Sweep setting with intensity 12-13 mA for both channels. Pt response: Changes in tingling sensation during application of estim ie) did not feel tingling in L hand but tingling did return upon removal of electrodes. Skin within normal limits to visual inspection before and after.     TENS Applied throughout review of OT POC for progress note as well as provision of information for patient to check on insurance coverage of estim unit and UE table top activities with ongoig education re ongoing de/resensitization activities.  In addition, trial of paraffin for R UE occurred with patient finding comfort in the warm aspects of wax ie) reduces pain and loosened joints.  In addition, deep pressure of weight of the paraffin/towel wrap to R hand felt good to patient as well as and is another option to help interrupt negative sensory feelings.    Retested grip strength for OT progress note with minimal improvements noted.    PATIENT EDUCATION: Education details: Trial of estim Person educated: Patient Education method: Explanation  and Demonstration Education comprehension: verbalized understanding and needs further education  HOME EXERCISE PROGRAM: 02/12/23 Putty Exercises: Access Code: [email or web address] 02/19/23 Coordination Activities with Images 02/25/23 Desensitization Program handout 03/04/23 Progression of Putty Exercises: Access Code: [email or web address]   GOALS: Goals reviewed with patient? Yes  SHORT TERM GOALS: Target date: 03/07/23  Patient will demonstrate UE strength/coordination HEP with 25% verbal cues or less for proper execution.  Baseline: To be provided Goal status: MET  2.  Patient will demonstrate improved sensory awareness/Stereognosis to identify different items in her purse/pocket and different coins with vision occluded.  Baseline: Tingling & numbness in B palmar surfaces  Goal status: IN PROGRESS 04/01/23 - still has numbness in finger tips with ongoing tingling and numbness daily  3.  Patient will improve pinch strength/coordination & sensation of B hands to increase ease & independence in tying shoelaces, and manage buttons with vision occluded Baseline: Self reported difficulty. Goal status: MET   LONG TERM GOALS: Target date: 04/04/23  Patient will demonstrate MI with sensory stimulation HEP with MI  Baseline: To be provided. Goal status: MET  2.  Patient will demonstrate at least 30 lbs R UE grip strength and 25 lbs L UE grip strength as needed to open jars and other containers.  Baseline: Rt 22 lbs; Lt 18 lbs Goal status: IN PROGRESS 04/01/23 Right: 27.7, 25.1, 25.1 Average: 26.0 lbs Left:21.8, 21.6, 24.9 Average: 22.8 lbs  3.  Patient will demonstrate UE coordination to resume playing the ukulele  Baseline: Unable to move fingers for different cords by self report. Goal status: MET  ASSESSMENT:  CLINICAL IMPRESSION:  Patient is a 67 y.o. female who returned today for OT today due  to UE dysfunction due to neuropathy and resulting coordination and strength deficits due to Guillain Barre Syndrome affecting B hands (palmar surfaces especially). She trialled estim/TENS today for management of discomfort and sensory changes on L hand s/p some improvements noted s/p trial with R UE previously.  Patient continues to have a wonderful bag of various items for her HEP for sensory stimulation and FM activities at home.  She also responded well to warm of paraffin and deep pressure of weight of towel wrapping hand today also.  Pt will benefit from continued skilled OT services in the outpatient setting to complete education re: sensory and coordination deficits to help pt return to PLOF as able.     This 8th progress note is for dates: 02/03/23 to 04/01/2023. Pt has met 2/3 STGs and 2/3 LTGs. Pt making progress towards goals as expected and continues to benefit from skilled OT services in the outpatient setting to work towards remaining goals or until max rehab potential is met.    PERFORMANCE DEFICITS: in functional skills including ADLs, coordination, dexterity, sensation, strength, flexibility, Fine motor control, decreased knowledge of precautions, and UE functional use.  IMPAIRMENTS: are limiting patient from ADLs, leisure, and social participation.   CO-MORBIDITIES: may have co-morbidities  that affects occupational performance. Patient will benefit from skilled OT to address above impairments and improve overall function.  REHAB POTENTIAL: Excellent  PLAN:  OT FREQUENCY: 1x/week  OT DURATION: 8 weeks  PLANNED INTERVENTIONS: self care/ADL training, therapeutic exercise, therapeutic activity, neuromuscular re-education, balance training, fluidotherapy, patient/family education, coping strategies training, and DME and/or AE instructions  RECOMMENDED OTHER SERVICES: PT treatments in place s/p eval on same day as OT.  CONSULTED AND AGREED WITH PLAN OF CARE: Patient  PLAN FOR NEXT  SESSION:   Continued exploration of Modalities for  neuropathy for home use ie) TENS/etim, parraffin.    Stereognosis to identify different items Check ease with tying shoelaces, and manage buttons  Review & progress sensory stimulation education, putty/coordination HEP ideas.   Victorino Sparrow, OT 04/01/2023, 12:44 PM

## 2023-04-01 NOTE — Therapy (Addendum)
OUTPATIENT PHYSICAL THERAPY NEURO TREATMENT   Patient Name: [name] MRN: [id number] DOB:10/07/1955, 67 y.o., female Today's Date: 04/01/2023  PCP: Suanne Marker, MD REFERRING PROVIDER: Suanne Marker, MD  END OF SESSION:  PT End of Session - 04/01/23 1021     Visit Number 9    Number of Visits 14    Date for PT Re-Evaluation 04/14/23    Authorization Type Medicare    PT Start Time 1017    PT Stop Time 1058    PT Time Calculation (min) 41 min    Equipment Utilized During Treatment Gait belt    Activity Tolerance Patient tolerated treatment well    Behavior During Therapy WFL for tasks assessed/performed             Past Medical History:  Diagnosis Date   ADD (attention deficit disorder)    Anxiety    Back pain    CHF (congestive heart failure) (HCC)    Constipation    Daytime somnolence 04/06/2013   DDD (degenerative disc disease), lumbar    Depression    Depression    GERD (gastroesophageal reflux disease)    Hypercholesteremia    Hypertension    IBS (irritable bowel syndrome)    Joint pain    OSA on CPAP 04/06/2013   Palpitations    Sleep apnea    Swelling of both lower extremities    Thyroid condition    Urinary incontinence    Past Surgical History:  Procedure Laterality Date   ABLATION  09/29/2017   BACK SURGERY  04/06/2019   BREAST EXCISIONAL BIOPSY Left    CATARACT EXTRACTION Bilateral    ELECTROPHYSIOLOGIC STUDY N/A 01/01/2016   Procedure: SVT Ablation;  Surgeon: Marinus Maw, MD;  Location: [facility] INVASIVE CV LAB;  Service: Cardiovascular;  Laterality: N/A;   HAND SURGERY Right    KNEE ARTHROPLASTY Left    NASAL SEPTUM SURGERY     RADICAL HYSTERECTOMY  2000   RIGHT HEART CATH N/A 05/22/2021   Procedure: RIGHT HEART CATH;  Surgeon: Swaziland, Peter M, MD;  Location: [facility] INVASIVE CV LAB;  Service: Cardiovascular;  Laterality: N/A;   TONSILLECTOMY AND ADENOIDECTOMY     Patient Active Problem List   Diagnosis Date Noted    Vitamin D deficiency 03/26/2023   Other fatigue 03/13/2023   SOBOE (shortness of breath on exertion) 03/13/2023   Gout 03/13/2023   Iron deficiency 03/13/2023   Unspecified severe protein-calorie malnutrition (HCC) 03/13/2023   Depression screen 03/13/2023   Generalized obesity 03/13/2023   Obesity (BMI 30-39.9) 03/13/2023   Guillain Barr syndrome (HCC) 01/16/2023   Coronary artery disease involving native coronary artery of native heart without angina pectoris 08/24/2021   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   Elevated coronary artery calcium score 12/06/2020   Mixed hyperlipidemia 10/12/2020   Primary hypertension 01/19/2020   Chronic diastolic (congestive) heart failure (HCC) 01/19/2020   Dyspnea on exertion 11/05/2019   Hiatal hernia 11/05/2019   PSVT (paroxysmal supraventricular tachycardia) 01/01/2016   Thyroid nodule 04/14/2013   OSA on CPAP 04/06/2013   Daytime somnolence 04/06/2013    ONSET DATE: 01/16/2023 (referral date)  REFERRING DIAG: G61.0 (ICD-10-CM) - Guillain Barr syndrome (HCC)  THERAPY DIAG:  Muscle weakness (generalized)  Unsteadiness on feet  Other abnormalities of gait and mobility  Rationale for Evaluation and Treatment: Rehabilitation  SUBJECTIVE:  SUBJECTIVE STATEMENT: Pt reports that she has joined a water aerobic class and she loved it. Patient reports that she has had some challenges with the new diet she has been put on but is otherwise doing well. Denies falls/near falls.   Pt accompanied by: self  PERTINENT HISTORY: dyspnea on exertion (thought to be due to deconditioning)   PAIN:  Are you having pain? No (Only numbness and tingling)  PRECAUTIONS: Fall  WEIGHT BEARING RESTRICTIONS: No  FALLS: Has patient fallen in last 6 months?  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 I would also like to get back to line dancing."   OBJECTIVE:   DIAGNOSTIC FINDINGS:  CT Head wo Contrast:  IMPRESSION: Unremarkable head CT.  No explanation for symptoms.  COGNITION: Overall cognitive status: Within functional limits for tasks assessed    LOWER EXTREMITY ROM:      Grossly WFL  LOWER EXTREMITY MMT:    MMT Right Eval Left Eval  Hip flexion 4-/5 4-/5  Hip extension    Hip abduction 3+/5 3+/5  Hip adduction 3+/5 3+/5  Hip internal rotation    Hip external rotation    Knee flexion 3+/5 3+/5  Knee extension 4-/5 4-/5  Ankle dorsiflexion 3+/5 3+/5  Ankle plantarflexion    Ankle inversion    Ankle eversion    (Blank rows = not tested)   VITALS:  Vitals:   04/01/23 1035  BP: 134/76  Pulse: (!) 59      TODAY'S TREATMENT:                                                                                                                               Vitals:   04/01/23 1035  BP: 134/76  Pulse: (!) 59   NMR:  Provided Chai Ti workout printout from aquatic therapy and educated patient that they will review in next aquatic session  Dynamic Balance on compliant surface through grass with yellow Wikband (CGA-SBA) - Forward marching with theraband horizontal abduction and ER for increased postural control challenge 2 x 15 feet - Glute kicks with theraband horizontal abduction and ER for increased postural control challenge 2 x 15 feet - Straight leg kickouts with  diagonal shoulder pullaparts 2 x 15 feet - Resisted gait without perturbations 2 x 40 feet - Tandem walking 3 x 15' on grass and 3 x 15' on sidewalk   PATIENT EDUCATION: Education details: Updates to HEP Person educated: Patient Education method: Hospital doctor  comprehension: verbalized understanding and needs further education  HOME EXERCISE PROGRAM: Access Code: [email or web address] URL: [email or web address] Date: 02/10/2023 Prepared by: Maryruth Eve  Exercises - Sit to Stand Without Arm Support  - 1 x daily - 5 x weekly - 3 sets - 10 reps - Tandem Walking with Counter Support  - 1 x daily - 5 x weekly - 3 sets - Corner Balance Feet Together With Eyes Closed  - 1 x daily - 7 x weekly - 3 sets - 30 seconds hold - Corner Balance Feet Together: Eyes Closed With Head Turns  - 1 x daily - 7 x weekly - 3 sets - 10 reps - Kettlebell Deadlift  - 1 x daily - 7 x weekly - 3 sets - 10 reps - Lunge with Counter Support  - 1 x daily - 7 x weekly - 3 sets - 10 reps  GOALS: Goals reviewed with patient? Yes  SHORT TERM GOALS: Target date: 03/03/2023  Patient will demonstrate independence with initial HEP to continue to progress between physical therapy sessions.   Baseline: Progressing but patient will benefit from aquatic therapy to help manage LE symptoms; patient reports confidence in HEP Goal status: MET  2.  Functional Gait assessment to be assessed/ LTG goal written Baseline: Assessed on 7/15 Goal status: MET  3.  Patient will improve their 5x Sit to Stand score to less than 15 seconds without UE support to demonstrate a decreased risk for falls and improved LE strength.   Baseline: 17.2 seconds with thigh assist; improved 12.92 second without UE use Goal status: MET  LONG TERM GOALS: Target date: 03/31/2023  Patient will report demonstrate independence with final HEP in order to maintain current gains and continue to progress after physical therapy discharge.   Baseline:  To be assessed Goal status: INITIAL  2.  Patient will improve ABC Score to 86% or greater to indicate improved self-reported confidence in balance and sense of steadiness.    Baseline: 72% Goal status: INITIAL  3.  Patient will improve gait speed to 1.1 m/s or greater to indicate a reduced risk for falls.   Baseline: 0.93 m/s without AD Goal status: INITIAL  4.  Patient will improve FGA to greater than 22/30 to indicate a decreased risk of falls and improved dynamic stability.    Baseline: 16/30  Goal status: INITIAL  5.  Patient will improve their 5x Sit to Stand score to less than 12 seconds to demonstrate a decreased risk for falls and improved LE strength.   Baseline: 17.2 seconds with thigh assist; improved 12.92 seconds without UE use Goal status: PROGRESSING ASSESSMENT:  CLINICAL IMPRESSION:  Session emphasized dynamic balance on compliant surface with UE resistance for increased postural control challenge. Patient tolerated well progressing form CGA-SBA. Patient to have one more aquatic class and then plan to D/C on land on final visit pending continued progress. Continue POC.   OBJECTIVE IMPAIRMENTS: Abnormal gait, decreased activity tolerance, decreased balance, decreased endurance, decreased mobility, difficulty walking, decreased strength, impaired sensation, and impaired UE functional use.   ACTIVITY LIMITATIONS: carrying, squatting, transfers, and locomotion level  PARTICIPATION LIMITATIONS: community activity and line dancing  PERSONAL FACTORS: Fitness and 1 comorbidity: see above  are also affecting patient's functional outcome.   REHAB POTENTIAL: Good  CLINICAL DECISION MAKING: Stable/uncomplicated  EVALUATION COMPLEXITY: Low  PLAN:  PT FREQUENCY: 1-2x/week  PT DURATION: 8 weeks  PLANNED INTERVENTIONS: Therapeutic exercises, Therapeutic activity, Neuromuscular re-education, Balance training, Gait training, Patient/Family education, Self Care, Aquatic  Therapy, and Re-evaluation  PLAN FOR NEXT SESSION:  plan for D/C on land on final visit 9/16  10th visit progress note due, Aquatic: Ai chi, postural work - Human resources officer and possibly [employer] (PT on land provided laminated prinouts on 9/3)  Maryruth Eve, PT, DPT 04/01/2023, 11:30 AM

## 2023-04-01 NOTE — Telephone Encounter (Signed)
I contacted the patient to reschedule the appointment with Dr. Sharee Holster on 05/08/2023 as she is currently out of the office. Sent mychart message as well

## 2023-04-03 ENCOUNTER — Encounter: Payer: Self-pay | Admitting: Physical Therapy

## 2023-04-03 ENCOUNTER — Ambulatory Visit: Payer: Medicare Other | Admitting: Physical Therapy

## 2023-04-03 DIAGNOSIS — M6281 Muscle weakness (generalized): Secondary | ICD-10-CM | POA: Diagnosis not present

## 2023-04-03 DIAGNOSIS — R2681 Unsteadiness on feet: Secondary | ICD-10-CM

## 2023-04-03 DIAGNOSIS — R2689 Other abnormalities of gait and mobility: Secondary | ICD-10-CM

## 2023-04-03 DIAGNOSIS — R29818 Other symptoms and signs involving the nervous system: Secondary | ICD-10-CM

## 2023-04-03 DIAGNOSIS — R278 Other lack of coordination: Secondary | ICD-10-CM

## 2023-04-03 DIAGNOSIS — R208 Other disturbances of skin sensation: Secondary | ICD-10-CM

## 2023-04-03 NOTE — Therapy (Signed)
OUTPATIENT PHYSICAL THERAPY NEURO TREATMENT   Patient Name: [name] MRN: [id number] DOB:Aug 30, 1955, 67 y.o., female Today's Date: 04/03/2023  PCP: Suanne Marker, MD REFERRING PROVIDER: Suanne Marker, MD  END OF SESSION:  PT End of Session - 04/03/23 1352     Visit Number 10    Number of Visits 14    Date for PT Re-Evaluation 04/14/23    Authorization Type Medicare    PT Start Time 0931    PT Stop Time 1014    PT Time Calculation (min) 43 min    Equipment Utilized During Treatment Other (comment)   yellow noodle   Activity Tolerance Patient tolerated treatment well    Behavior During Therapy WFL for tasks assessed/performed             Past Medical History:  Diagnosis Date   ADD (attention deficit disorder)    Anxiety    Back pain    CHF (congestive heart failure) (HCC)    Constipation    Daytime somnolence 04/06/2013   DDD (degenerative disc disease), lumbar    Depression    Depression    GERD (gastroesophageal reflux disease)    Hypercholesteremia    Hypertension    IBS (irritable bowel syndrome)    Joint pain    OSA on CPAP 04/06/2013   Palpitations    Sleep apnea    Swelling of both lower extremities    Thyroid condition    Urinary incontinence    Past Surgical History:  Procedure Laterality Date   ABLATION  09/29/2017   BACK SURGERY  04/06/2019   BREAST EXCISIONAL BIOPSY Left    CATARACT EXTRACTION Bilateral    ELECTROPHYSIOLOGIC STUDY N/A 01/01/2016   Procedure: SVT Ablation;  Surgeon: Marinus Maw, MD;  Location: [facility] INVASIVE CV LAB;  Service: Cardiovascular;  Laterality: N/A;   HAND SURGERY Right    KNEE ARTHROPLASTY Left    NASAL SEPTUM SURGERY     RADICAL HYSTERECTOMY  2000   RIGHT HEART CATH N/A 05/22/2021   Procedure: RIGHT HEART CATH;  Surgeon: [place], Peter M, MD;  Location: [facility] INVASIVE CV LAB;  Service: Cardiovascular;  Laterality: N/A;   TONSILLECTOMY AND ADENOIDECTOMY     Patient Active Problem List    Diagnosis Date Noted   Vitamin D deficiency 03/26/2023   Other fatigue 03/13/2023   SOBOE (shortness of breath on exertion) 03/13/2023   Gout 03/13/2023   Iron deficiency 03/13/2023   Unspecified severe protein-calorie malnutrition (HCC) 03/13/2023   Depression screen 03/13/2023   Generalized obesity 03/13/2023   Obesity (BMI 30-39.9) 03/13/2023   Guillain Barr syndrome (HCC) 01/16/2023   Coronary artery disease involving native coronary artery of native heart without angina pectoris 08/24/2021   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   Elevated coronary artery calcium score 12/06/2020   Mixed hyperlipidemia 10/12/2020   Primary hypertension 01/19/2020   Chronic diastolic (congestive) heart failure (HCC) 01/19/2020   Dyspnea on exertion 11/05/2019   Hiatal hernia 11/05/2019   PSVT (paroxysmal supraventricular tachycardia) 01/01/2016   Thyroid nodule 04/14/2013   OSA on CPAP 04/06/2013   Daytime somnolence 04/06/2013    ONSET DATE: 01/16/2023 (referral date)  REFERRING DIAG: G61.0 (ICD-10-CM) - Guillain Barr syndrome (HCC)  THERAPY DIAG:  Other disturbances of skin sensation  Other symptoms and signs involving the nervous system  Muscle weakness (generalized)  Other lack of coordination  Unsteadiness on feet  Other abnormalities of gait and mobility  Rationale for Evaluation and Treatment: Rehabilitation  SUBJECTIVE:  SUBJECTIVE STATEMENT: Pt has been in the pool doing her aquatic HEP 4 times since this therapist last saw her.  She has also attended one outdoor water aerobics class.  Pt accompanied by: self  PERTINENT HISTORY: dyspnea on exertion (thought to be due to deconditioning)   PAIN:  Are you having pain? No (Only numbness and tingling in hands and  feet)  PRECAUTIONS: Fall  WEIGHT BEARING RESTRICTIONS: No  FALLS: Has patient fallen in last 6 months?  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 I would also like to get back to line dancing."   OBJECTIVE:   DIAGNOSTIC FINDINGS:  CT Head wo Contrast:  IMPRESSION: Unremarkable head CT.  No explanation for symptoms.  COGNITION: Overall cognitive status: Within functional limits for tasks assessed    LOWER EXTREMITY ROM:      Grossly WFL  LOWER EXTREMITY MMT:    MMT Right Eval Left Eval  Hip flexion 4-/5 4-/5  Hip extension    Hip abduction 3+/5 3+/5  Hip adduction 3+/5 3+/5  Hip internal rotation    Hip external rotation    Knee flexion 3+/5 3+/5  Knee extension 4-/5 4-/5  Ankle dorsiflexion 3+/5 3+/5  Ankle plantarflexion    Ankle inversion    Ankle eversion    (Blank rows = not tested)   VITALS:  There were no vitals filed for this visit.     TODAY'S TREATMENT:                                                                                                                               There were no vitals filed for this visit.  Aquatic therapy at [facility] - pool temperature 92 degrees   Patient seen for aquatic therapy today.  Treatment took place in water 3.6-4.8 feet deep depending upon activity.  Patient entered and exited the pool via stairs reciprocally using bilateral rails at modified independent level.   Exercises: Verbally reviewed HEP - pt has been performing well and feels  comfortable with this.  Warmup:  water walking unsupported forwards 4x18 ft > backwards 4 x 18 ft > laterally 4 x 18 ft  PT answers questions regarding use of water dumbbells and noodle in pool to progress and modify her exercises.  Had pt return demo of noodle plank and plank push-up.  Had her perform light reps of reaching, paddling, cycling, marching, and LAQ sitting swing style in noodle.  She demonstrates saddle sit w/  paddling using dumbbells.  Ai Chi Handout reviewed (postures 1-16) - pt able to perform with return demo on initial rep, performed 10 reps of each (additional 10 for each posture that is performed bilaterally.    Patient requires buoyancy of the water for support for reduced fall risk with gait training and balance exercises without support. Exercises able to be performed safely in water without the risk of fall compared to those same exercises performed on land; viscosity of water needed for resistance for strengthening. Current of water provides perturbations for challenging static and dynamic balance.   PATIENT EDUCATION: Education details: Ai Chi handout and aquatic HEP modifications/progressions. Person educated: Patient Education method: Explanation Education comprehension: verbalized understanding and needs further education  HOME EXERCISE PROGRAM: Access Code: [email or web address] URL: [email or web address] Date: 02/10/2023 Prepared by: Maryruth Eve  Exercises - Sit to Stand Without Arm Support  - 1 x daily - 5 x weekly - 3 sets - 10 reps - Tandem Walking with Counter Support  - 1 x daily - 5 x weekly - 3 sets - Corner Balance Feet Together With Eyes Closed  - 1 x daily - 7 x weekly - 3 sets - 30 seconds hold - Corner Balance Feet Together: Eyes Closed With Head Turns  - 1 x daily - 7 x weekly - 3 sets - 10 reps - Kettlebell Deadlift  - 1 x daily - 7 x weekly - 3 sets - 10 reps - Lunge with Counter Support  - 1 x daily - 7 x weekly - 3 sets - 10  reps  [id number] - pool HEP & Ai Chi Handout  GOALS: Goals reviewed with patient? Yes  SHORT TERM GOALS: Target date: 03/03/2023  Patient will demonstrate independence with initial HEP to continue to progress between physical therapy sessions.   Baseline: Progressing but patient will benefit from aquatic therapy to help manage LE symptoms; patient reports confidence in HEP Goal status: MET  2.  Functional Gait assessment to be assessed/ LTG goal written Baseline: Assessed on 7/15 Goal status: MET  3.  Patient will improve their 5x Sit to Stand score to less than 15 seconds without UE support to demonstrate a decreased risk for falls and improved LE strength.   Baseline: 17.2 seconds with thigh assist; improved 12.92 second without UE use Goal status: MET  LONG TERM GOALS: Target date: 03/31/2023  Patient will report demonstrate independence with final HEP in order to maintain current gains and continue to progress after physical therapy discharge.   Baseline: To be assessed Goal status: INITIAL  2.  Patient will improve ABC Score to 86% or greater to indicate improved self-reported confidence in balance and sense of steadiness.    Baseline: 72% Goal status: INITIAL  3.  Patient will improve gait speed to 1.1 m/s or greater to indicate a reduced risk for falls.   Baseline: 0.93 m/s without AD Goal status: INITIAL  4.  Patient will improve FGA to greater than 22/30 to indicate a decreased risk of falls and improved dynamic stability.    Baseline: 16/30  Goal status: INITIAL  5.  Patient will improve their 5x Sit to Stand score to less than 12 seconds to demonstrate a decreased risk for falls and improved LE strength.   Baseline: 17.2 seconds with thigh assist; improved 12.92 seconds without UE use Goal status: PROGRESSING ASSESSMENT:  CLINICAL IMPRESSION:  Patient does very well in aquatic environment tolerating all challenges well and performs all tasks at independent level.  She has an established aquatic HEP and time spent reviewing Ai Chi handout this session.  Patient has already established community pool and aquatic classes so will discontinue pool visits at this time.  OBJECTIVE IMPAIRMENTS: Abnormal gait, decreased activity tolerance, decreased balance, decreased endurance, decreased mobility, difficulty walking, decreased strength, impaired sensation, and impaired UE functional use.   ACTIVITY LIMITATIONS: carrying, squatting, transfers, and locomotion level  PARTICIPATION LIMITATIONS: community activity and line dancing  PERSONAL FACTORS: Fitness and 1 comorbidity: see above  are also affecting patient's functional outcome.   REHAB POTENTIAL: Good  CLINICAL DECISION MAKING: Stable/uncomplicated  EVALUATION COMPLEXITY: Low  PLAN:  PT FREQUENCY: 1-2x/week  PT DURATION: 8 weeks  PLANNED INTERVENTIONS: Therapeutic exercises, Therapeutic activity, Neuromuscular re-education, Balance training, Gait training, Patient/Family education, Self Care, Aquatic Therapy, and Re-evaluation  PLAN FOR NEXT SESSION:  plan for D/C on land on final visit 9/16   Camille Bal, PT, DPT 04/03/2023, 1:53 PM

## 2023-04-08 ENCOUNTER — Ambulatory Visit: Payer: Medicare Other | Admitting: Physical Therapy

## 2023-04-09 ENCOUNTER — Encounter (INDEPENDENT_AMBULATORY_CARE_PROVIDER_SITE_OTHER): Payer: Self-pay | Admitting: Adult Health

## 2023-04-09 ENCOUNTER — Ambulatory Visit (INDEPENDENT_AMBULATORY_CARE_PROVIDER_SITE_OTHER): Payer: Medicare Other | Admitting: Adult Health

## 2023-04-09 VITALS — BP 125/85 | HR 89 | Temp 97.5°F | Ht 64.0 in | Wt 187.0 lb

## 2023-04-09 DIAGNOSIS — E559 Vitamin D deficiency, unspecified: Secondary | ICD-10-CM | POA: Diagnosis not present

## 2023-04-09 DIAGNOSIS — E669 Obesity, unspecified: Secondary | ICD-10-CM

## 2023-04-09 DIAGNOSIS — Z6832 Body mass index (BMI) 32.0-32.9, adult: Secondary | ICD-10-CM

## 2023-04-09 DIAGNOSIS — E88819 Insulin resistance, unspecified: Secondary | ICD-10-CM | POA: Diagnosis not present

## 2023-04-09 DIAGNOSIS — I10 Essential (primary) hypertension: Secondary | ICD-10-CM | POA: Diagnosis not present

## 2023-04-09 NOTE — Progress Notes (Signed)
WEIGHT SUMMARY AND BIOMETRICS  Vitals Temp: (!) 97.5 F (36.4 C) BP: 125/85 Pulse Rate: 89 SpO2: 97 %   Anthropometric Measurements Height: 5\' 4"  (1.626 m) Weight: 187 lb (84.8 kg) BMI (Calculated): 32.08 Weight at Last Visit: 188lb Weight Lost Since Last Visit: 1lb Weight Gained Since Last Visit: 0 Starting Weight: 192lb Total Weight Loss (lbs): 5 lb (2.268 kg) Peak Weight: 202lb   Body Composition  Body Fat %: 42.3 % Fat Mass (lbs): 79.2 lbs Muscle Mass (lbs): 102.6 lbs Total Body Water (lbs): 69 lbs Visceral Fat Rating : 12   Other Clinical Data Fasting: no Labs: no Today's Visit #: 3 Starting Date: 03/13/23    Chief Complaint:   OBESITY [name] is here to discuss her progress with her obesity treatment plan. She is on the the Category 2 Plan and states she is following her eating plan approximately 75 % of the time. She states she is exercising Water Aerobics and Line Dancing 60/60 minutes 1/1 times per week.   Interim History:  This is [name]'s 3rd visit at [facility] She has numerous questions about  Cat 2 Meal Plan and how to prepare meals.  She is concerned with Na+ level in sandwich meat.  She is challenged to consume the prescribed ounces of meat for dinner.  03/25/2023 ED for constipation  Subjective:   1. Vitamin D deficiency  Latest Reference Range & Units 03/13/23 11:09  Vitamin D, 25-Hydroxy 30.0 - 100.0 ng/mL 28.6 (L)  Vitamin B12 232 - 1,245 pg/mL 476  (L): Data is abnormally low  Recently started on weekly Ergocalciferol- denies N/V/Muscle Weakness  2. Insulin resistance- new diag  Latest Reference Range & Units 03/13/23 11:09  Glucose 70 - 99 mg/dL 88  Hemoglobin Z6X 4.8 - 5.6 % 5.3  Est. average glucose Bld gHb Est-mCnc mg/dL 096  INSULIN 2.6 - 04.5 uIU/mL 34.0 (H)  (H): Data is abnormally high  Reviewed at length the sequela of insulin resistance and treatment options.  3. HTN BP at goal at OV She denies CP with  exertion She is currently on aspirin EC 81 MG tablet  pravastatin (PRAVACHOL) 40 MG tablet  spironolactone (ALDACTONE) 50 MG tablet  ezetimibe (ZETIA) 10 MG tablet   Assessment/Plan:   1. Vitamin D deficiency Continue once weekly Ergocalciferol- denies need for refill today  2. Insulin resistance- new diag Continue to reduce simple CHO/sugar intake Continue to increase protein and daily activity  3. HTN Continue aspirin EC 81 MG tablet  pravastatin (PRAVACHOL) 40 MG tablet  spironolactone (ALDACTONE) 50 MG tablet  ezetimibe (ZETIA) 10 MG tablet    4. Obesity (BMI 30-39.9)- Current BMI: 32.08  [name] is currently in the action stage of change. As such, her goal is to continue with weight loss efforts. She has agreed to the Category 2 Plan.   Exercise goals: Older adults should follow the adult guidelines. When older adults cannot meet the adult guidelines, they should be as physically active as their abilities and conditions will allow.  Older adults should do exercises that maintain or improve balance if they are at risk of falling.  Older adults should determine their level of effort for physical activity relative to their level of fitness.  Older adults with chronic conditions should understand whether and how their conditions affect their ability to do regular physical activity safely.   Handouts- Recipe Guide, Protein Equivalent Options   Behavioral modification strategies: increasing lean protein intake, decreasing simple carbohydrates, increasing  vegetables, increasing water intake, no skipping meals, meal planning and cooking strategies, better snacking choices, and planning for success.  Salimata has agreed to follow-up with our clinic in 3 weeks. She was informed of the importance of frequent follow-up visits to maximize her success with intensive lifestyle modifications for her multiple health conditions.   Objective:   Blood pressure 125/85, pulse 89, temperature (!)  97.5 F (36.4 C), height 5\' 4"  (1.626 m), weight 187 lb (84.8 kg), SpO2 97%. Body mass index is 32.1 kg/m.  General: Cooperative, alert, well developed, in no acute distress. HEENT: Conjunctivae and lids unremarkable. Cardiovascular: Regular rhythm.  Lungs: Normal work of breathing. Neurologic: No focal deficits.   Lab Results  Component Value Date   CREATININE 0.98 03/13/2023   BUN 17 03/13/2023   NA 141 03/13/2023   K 4.5 03/13/2023   CL 104 03/13/2023   CO2 21 03/13/2023   Lab Results  Component Value Date   ALT 20 03/13/2023   AST 24 03/13/2023   ALKPHOS 52 03/13/2023   BILITOT 0.6 03/13/2023   Lab Results  Component Value Date   HGBA1C 5.3 03/13/2023   Lab Results  Component Value Date   INSULIN 34.0 (H) 03/13/2023   Lab Results  Component Value Date   TSH 0.901 03/13/2023   Lab Results  Component Value Date   CHOL 144 03/13/2023   HDL 52 03/13/2023   LDLCALC 73 03/13/2023   TRIG 105 03/13/2023   CHOLHDL 2.8 03/13/2023   Lab Results  Component Value Date   VD25OH 28.6 (L) 03/13/2023   Lab Results  Component Value Date   WBC 6.8 03/13/2023   HGB 14.6 03/13/2023   HCT 46.9 (H) 03/13/2023   MCV 88 03/13/2023   PLT 219 03/13/2023   Lab Results  Component Value Date   IRON 77 03/13/2023   TIBC 343 03/13/2023   FERRITIN 59 03/13/2023    Attestation Statements:   Reviewed by clinician on day of visit: allergies, medications, problem list, medical history, surgical history, family history, social history, and previous encounter notes.  Time spent on visit including pre-visit chart review and post-visit care and charting was 32 minutes.   Extra time face to face provided to FULLY review eating plan and goals (interval and long term).  I have reviewed the above documentation for accuracy and completeness, and I agree with the above. -  Kennedy Bohanon d. Cindra Austad, NP-C

## 2023-04-14 ENCOUNTER — Ambulatory Visit: Payer: Medicare Other | Admitting: Occupational Therapy

## 2023-04-14 ENCOUNTER — Ambulatory Visit: Payer: Medicare Other | Admitting: Physical Therapy

## 2023-04-14 ENCOUNTER — Encounter: Payer: Self-pay | Admitting: Physical Therapy

## 2023-04-14 DIAGNOSIS — R208 Other disturbances of skin sensation: Secondary | ICD-10-CM

## 2023-04-14 DIAGNOSIS — M6281 Muscle weakness (generalized): Secondary | ICD-10-CM | POA: Diagnosis not present

## 2023-04-14 DIAGNOSIS — R29818 Other symptoms and signs involving the nervous system: Secondary | ICD-10-CM

## 2023-04-14 DIAGNOSIS — R278 Other lack of coordination: Secondary | ICD-10-CM

## 2023-04-14 NOTE — Therapy (Signed)
OUTPATIENT OCCUPATIONAL THERAPY NEURO TREATMENT & PROGRESS NOTE  Patient Name: [name] MRN: [id number] DOB:Sep 18, 1955, 67 y.o., female Today's Date: 04/14/2023  PCP: Chilton Greathouse, MD REFERRING PROVIDER: Suanne Marker, MD  END OF SESSION:  OT End of Session - 04/14/23 1103     Visit Number 9    Number of Visits 12   + evaluation   Date for OT Re-Evaluation 05/09/23    Authorization Type MEDICARE PART A AND B    Progress Note Due on Visit 12    OT Start Time 1102    OT Stop Time 1145    OT Time Calculation (min) 43 min    Equipment Utilized During Treatment E stim/TENS    Activity Tolerance Patient tolerated treatment well    Behavior During Therapy WFL for tasks assessed/performed             Past Medical History:  Diagnosis Date   ADD (attention deficit disorder)    Anxiety    Back pain    CHF (congestive heart failure) (HCC)    Constipation    Daytime somnolence 04/06/2013   DDD (degenerative disc disease), lumbar    Depression    Depression    GERD (gastroesophageal reflux disease)    Hypercholesteremia    Hypertension    IBS (irritable bowel syndrome)    Joint pain    OSA on CPAP 04/06/2013   Palpitations    Sleep apnea    Swelling of both lower extremities    Thyroid condition    Urinary incontinence    Past Surgical History:  Procedure Laterality Date   ABLATION  09/29/2017   BACK SURGERY  04/06/2019   BREAST EXCISIONAL BIOPSY Left    CATARACT EXTRACTION Bilateral    ELECTROPHYSIOLOGIC STUDY N/A 01/01/2016   Procedure: SVT Ablation;  Surgeon: Marinus Maw, MD;  Location: [facility] INVASIVE CV LAB;  Service: Cardiovascular;  Laterality: N/A;   HAND SURGERY Right    KNEE ARTHROPLASTY Left    NASAL SEPTUM SURGERY     RADICAL HYSTERECTOMY  2000   RIGHT HEART CATH N/A 05/22/2021   Procedure: RIGHT HEART CATH;  Surgeon: [place], Peter M, MD;  Location: [facility] INVASIVE CV LAB;  Service: Cardiovascular;  Laterality: N/A;   TONSILLECTOMY AND  ADENOIDECTOMY     Patient Active Problem List   Diagnosis Date Noted   Vitamin D deficiency 03/26/2023   Other fatigue 03/13/2023   SOBOE (shortness of breath on exertion) 03/13/2023   Gout 03/13/2023   Iron deficiency 03/13/2023   Unspecified severe protein-calorie malnutrition (HCC) 03/13/2023   Depression screen 03/13/2023   Generalized obesity 03/13/2023   Obesity (BMI 30-39.9) 03/13/2023   Guillain Barr syndrome (HCC) 01/16/2023   Coronary artery disease involving native coronary artery of native heart without angina pectoris 08/24/2021   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  Rationale for Evaluation and Treatment: Rehabilitation  SUBJECTIVE:   SUBJECTIVE STATEMENT:  Patient has completed her pool PT and has found a sports pool  to attend on her own.  She reports enjoying the tai chi exercises and feels the pool therapy has opened up a lot of opportunities for her.  Patient reports effects of NMES from previous visit were more effective than TENS trialled last time ie) improved effectiveness with minimizing tingling during application and afterwards.  She was interested in trying it again and is hoping to be able to pursue obtaining one for home use.   Pt accompanied by: self  PERTINENT HISTORY:   MD report: 67 year old female evaluated for rapidly progressive ascending numbness and weakness.   Symptoms started around Dec 16, 2022.  Patient noticed a numbness sensation on her left forefoot and toes.  This progressed to the right foot and up her legs.  Within a week she had numbness and tingling and weakness in her hands up to her elbows.  She went to the emergency room twice for evaluation.  She was diagnosed with neuropathy and was recommended to follow-up with neurology.   Of note patient had preceding upper respiratory infection symptoms Dec 12, 2022.  She also had RSV vaccination earlier in May.   Symptoms have plateaued since 01/04/23.  Using a walker at home.  She feels weakness in her hands, arms, legs.  No breathing issues.  No swallowing issues.  No prior similar symptoms.  PRECAUTIONS: No  WEIGHT BEARING RESTRICTIONS: No  PAIN:  Are you having pain? Yes: NPRS scale: 5/10 Pain location: Hands have improved/feet are worse Pain description: achy in fingertips versus tingling in the feet Aggravating factors:  hard to determine Relieving factors: Gabapentin /Water therapy  FALLS: Has patient fallen in last 6 months? 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 Grooming: MI UB Dressing: 2 weeks ago - she could not fasten bra LB Dressing: Still feels stiff when putting on socks and is  still slower at this task.  Reports she can tie her laces now. Toileting: MI Bathing: Sitting to bath due to being unsteady Tub Shower transfers: MI with walk in shower Equipment: Shower seat with back  IADLs: Shopping: Patient was initailly driven by her husband for about 3 weeks but is now drving and returning to community outings/tasks on her own  Light housekeeping: Can help do her laundry now. Meal Prep: Husband does meal prep (previously did so also) Community mobility: Was using walker until about 6/5 - 01/17/23 - staying at home during that time Medication management: Returning to ability to open pill containers Financial management: Not asked. 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  COORDINATION:  9 Hole Peg test: Right: 23.05  sec; Left: 24.11  sec (Patient does reports task feeling funny and awkward compared to normal).  SENSATION: Light touch: Impaired palmar surfaces are tingling even with different temperatures Diminished from wrist and below bilaterally for UEs palmar surfaces and below ankles   EDEMA: noted fingers being stiff and swollen and L toes  MUSCLE TONE: WFL  COGNITION: Overall cognitive status: Within functional limits for tasks assessed  VISION: Subjective report: H/O cataract removal.  Has different glasses for distance and for reading Baseline vision: Wears glasses for distance only and Wears glasses for reading only Visual history: cataracts  VISION ASSESSMENT: Not tested  Patient reports no difficulty  with activities due to vision impairments  PERCEPTION: Not tested  PRAXIS: Not tested  EVALUATION OBSERVATIONS: Patient is a 67 y.o. female who was seen today for occupational therapy evaluation for UE numbness and tingling with resulting weakness and incoordination as a result of Guillain Barre Syndrome. Hx includes recent dx of probable Guillain Barre Syndrome. Patient currently presents below baseline level of functioning demonstrating functional deficits and impairments as noted below. Pt would benefit from skilled OT services in the outpatient setting to work on impairments as noted below to help pt return to PLOF as able.       TODAY'S TREATMENT:                                                                                                                                  Therapeutic Activities:    Modality: for pain control and sensory relaxation.  Trialled TENS estim applied to L hand/wrist with 4x2 inch circle pads. Applied 20 minutes with NMES 20:10 - 35 Hz frequency, 480 puse width and intensity 6-5mA for both channels. Pt response: Changes in tingling sensation during application of estim. Skin within normal limits to visual inspection before and after.     NMES Applied throughout UE coordination activities and education re ongoing desensitization activities.  Patient able to complete in hand manipulation and FM tasks including pegboard task, picking up multiple objects with hands and translating them to finger tips ie) to place marbles on top of golf tee pegs while estim pads applied.    PATIENT EDUCATION: Education details: Trial of estim/NMES Person educated: Patient Education method: Medical illustrator Education comprehension: verbalized understanding and needs further education  HOME EXERCISE PROGRAM: 02/12/23 Putty Exercises: Access Code: [email or web address] 02/19/23 Coordination Activities with Images 02/25/23 Desensitization Program handout 03/04/23 Progression of Putty  Exercises: Access Code: [email or web address]   GOALS: Goals reviewed with patient? Yes  SHORT TERM GOALS: Target date: 03/07/23  Patient will demonstrate UE strength/coordination HEP with 25% verbal cues or less for  proper execution.  Baseline: To be provided Goal status: MET  2.  Patient will demonstrate improved sensory awareness/Stereognosis to identify different items in her purse/pocket and different coins with vision occluded.  Baseline: Tingling & numbness in B palmar surfaces  Goal status: IN PROGRESS 04/01/23 - still has numbness in finger tips with ongoing tingling and numbness daily  3.  Patient will improve pinch strength/coordination & sensation of B hands to increase ease & independence in tying shoelaces, and manage buttons with vision occluded Baseline: Self reported difficulty. Goal status: MET   LONG TERM GOALS: Target date: 04/04/23  Patient will demonstrate MI with sensory stimulation HEP with MI  Baseline: To be provided. Goal status: MET  2.  Patient will demonstrate at least 30 lbs R UE grip strength and 25 lbs L UE grip strength as needed to open jars and other containers.  Baseline: Rt 22 lbs; Lt 18 lbs Goal status: IN PROGRESS 04/01/23 Right: 27.7, 25.1, 25.1 Average: 26.0 lbs Left:21.8, 21.6, 24.9 Average: 22.8 lbs  3.  Patient will demonstrate UE coordination to resume playing the ukulele  Baseline: Unable to move fingers for different cords by self report. Goal status: MET  ASSESSMENT:  CLINICAL IMPRESSION:  Patient is a 67 y.o. female who returned today for OT today due to UE dysfunction due to neuropathy and resulting coordination and strength deficits due to Guillain Barre Syndrome affecting B hands (palmar surfaces especially). She trialled estim/NMES again today for management of discomfort and sensory changes on L hand s/p improvements compared to TENS previously trialled.  Pt will benefit from continued skilled OT services in the outpatient setting to  complete education re: sensory and coordination deficits to help pt return to PLOF as able.     PERFORMANCE DEFICITS: in functional skills including ADLs, coordination, dexterity, sensation, strength, flexibility, Fine motor control, decreased knowledge of precautions, and UE functional use.  IMPAIRMENTS: are limiting patient from ADLs, leisure, and social participation.   CO-MORBIDITIES: may have co-morbidities  that affects occupational performance. Patient will benefit from skilled OT to address above impairments and improve overall function.  REHAB POTENTIAL: Excellent  PLAN:  OT FREQUENCY: 1x/week  OT DURATION: 8 weeks  PLANNED INTERVENTIONS: self care/ADL training, therapeutic exercise, therapeutic activity, neuromuscular re-education, balance training, fluidotherapy, patient/family education, coping strategies training, and DME and/or AE instructions  RECOMMENDED OTHER SERVICES: PT treatments in place s/p eval on same day as OT.  CONSULTED AND AGREED WITH PLAN OF CARE: Patient  PLAN FOR NEXT SESSION:   Continued exploration of Modalities for neuropathy for home use ie) NMES/etim.    Stereognosis to identify different items Check ease with tying shoelaces, and manage buttons  Review & progress sensory stimulation education, putty/coordination HEP ideas.   Victorino Sparrow, OT 04/14/2023, 11:06 AM

## 2023-04-14 NOTE — Therapy (Signed)
OUTPATIENT PHYSICAL THERAPY NEURO DISCHARGE   Patient Name: [name] MRN: [id number] DOB:01-Oct-1955, 67 y.o., female Today's Date: 04/14/2023  PCP: Suanne Marker, MD REFERRING PROVIDER: Suanne Marker, MD  PHYSICAL THERAPY DISCHARGE SUMMARY  Visits from Start of Care: 11  Current functional level related to goals / functional outcomes: See below   Remaining deficits: Decreased LE sensation   Education / Equipment: When to return to HEP; high level   Patient agrees to discharge. Patient goals were met. Patient is being discharged due to meeting the stated rehab goals.  END OF SESSION:  PT End of Session - 04/14/23 1021     Visit Number 11    Number of Visits 14    Date for PT Re-Evaluation 04/14/23    Authorization Type Medicare    PT Start Time 1019    PT Stop Time 1059    PT Time Calculation (min) 40 min    Equipment Utilized During Treatment Gait belt    Activity Tolerance Patient tolerated treatment well    Behavior During Therapy WFL for tasks assessed/performed             Past Medical History:  Diagnosis Date   ADD (attention deficit disorder)    Anxiety    Back pain    CHF (congestive heart failure) (HCC)    Constipation    Daytime somnolence 04/06/2013   DDD (degenerative disc disease), lumbar    Depression    Depression    GERD (gastroesophageal reflux disease)    Hypercholesteremia    Hypertension    IBS (irritable bowel syndrome)    Joint pain    OSA on CPAP 04/06/2013   Palpitations    Sleep apnea    Swelling of both lower extremities    Thyroid condition    Urinary incontinence    Past Surgical History:  Procedure Laterality Date   ABLATION  09/29/2017   BACK SURGERY  04/06/2019   BREAST EXCISIONAL BIOPSY Left    CATARACT EXTRACTION Bilateral    ELECTROPHYSIOLOGIC STUDY N/A 01/01/2016   Procedure: SVT Ablation;  Surgeon: Marinus Maw, MD;  Location: [facility] INVASIVE CV LAB;  Service: Cardiovascular;  Laterality:  N/A;   HAND SURGERY Right    KNEE ARTHROPLASTY Left    NASAL SEPTUM SURGERY     RADICAL HYSTERECTOMY  2000   RIGHT HEART CATH N/A 05/22/2021   Procedure: RIGHT HEART CATH;  Surgeon: [place], Peter M, MD;  Location: [facility] INVASIVE CV LAB;  Service: Cardiovascular;  Laterality: N/A;   TONSILLECTOMY AND ADENOIDECTOMY     Patient Active Problem List   Diagnosis Date Noted   Vitamin D deficiency 03/26/2023   Other fatigue 03/13/2023   SOBOE (shortness of breath on exertion) 03/13/2023   Gout 03/13/2023   Iron deficiency 03/13/2023   Unspecified severe protein-calorie malnutrition (HCC) 03/13/2023   Depression screen 03/13/2023   Generalized obesity 03/13/2023   Obesity (BMI 30-39.9) 03/13/2023   Guillain Barr syndrome (HCC) 01/16/2023   Coronary artery disease involving native coronary artery of native heart without angina pectoris 08/24/2021   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   Elevated coronary artery calcium score 12/06/2020   Mixed hyperlipidemia 10/12/2020   Primary hypertension 01/19/2020   Chronic diastolic (congestive) heart failure (HCC) 01/19/2020   Dyspnea on exertion 11/05/2019   Hiatal hernia 11/05/2019   PSVT (paroxysmal supraventricular tachycardia) 01/01/2016   Thyroid nodule 04/14/2013   OSA on CPAP 04/06/2013   Daytime somnolence 04/06/2013    ONSET  DATE: 01/16/2023 (referral date)  REFERRING DIAG: G61.0 (ICD-10-CM) - Guillain Barr syndrome (HCC)  THERAPY DIAG:  Other disturbances of skin sensation  Other symptoms and signs involving the nervous system  Muscle weakness (generalized)  Rationale for Evaluation and Treatment: Rehabilitation  SUBJECTIVE:                                                                                                                                                                                             SUBJECTIVE STATEMENT: Pt doing great; exercises going well. Denies falls/near falls. States ready for D/C and  would like to review resistance band exercises.  Pt accompanied by: self  PERTINENT HISTORY: dyspnea on exertion (thought to be due to deconditioning)   PAIN:  Are you having pain? No (Only numbness and tingling in hands and feet)  PRECAUTIONS: Fall  WEIGHT BEARING RESTRICTIONS: No  FALLS: Has patient fallen in last 6 months?  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 I would also like to get back to line dancing."   OBJECTIVE:   DIAGNOSTIC FINDINGS:  CT Head wo Contrast:  IMPRESSION: Unremarkable head CT.  No explanation for symptoms.  COGNITION: Overall cognitive status: Within functional limits for tasks assessed    LOWER EXTREMITY ROM:      Grossly WFL  LOWER EXTREMITY MMT:    MMT Right Eval Left Eval  Hip flexion 4-/5 4-/5  Hip extension    Hip abduction 3+/5 3+/5  Hip adduction 3+/5 3+/5  Hip internal rotation    Hip external rotation    Knee flexion 3+/5 3+/5  Knee extension 4-/5 4-/5  Ankle dorsiflexion 3+/5 3+/5  Ankle plantarflexion    Ankle inversion    Ankle eversion    (Blank rows = not tested)  TODAY'S TREATMENT:  Activities specific balance scale: 95% Confidence    [facility] PT Assessment - 04/14/23 0001       Standardized Balance Assessment   Standardized Balance Assessment 10 meter walk test;Five Times Sit to Stand    Five times sit to stand comments  8.70    seconds without UE use (SBA)   10 Meter Walk 1.3   m/s without AD (modI)     Functional Gait  Assessment   Gait assessed  Yes    Gait Level Surface Walks 20 ft in less than 5.5 sec, no assistive devices, good speed, no evidence for imbalance, normal gait pattern, deviates no more than 6 in outside of the 12 in walkway width.    Change in Gait Speed Able to smoothly change walking speed without loss of balance or gait deviation. Deviate no more than 6 in outside of the 12 in walkway width.    Gait with Horizontal Head Turns Performs head turns smoothly with no change in gait. Deviates no more than 6 in outside 12 in walkway width    Gait with Vertical Head Turns Performs head turns with no change in gait. Deviates no more than 6 in outside 12 in walkway width.    Gait and Pivot Turn Pivot turns safely within 3 sec and stops quickly with no loss of balance.    Step Over Obstacle Is able to step over 2 stacked shoe boxes taped together (9 in total height) without changing gait speed. No evidence of imbalance.    Gait with Narrow Base of Support Is able to ambulate for 10 steps heel to toe with no staggering.    Gait with Eyes Closed Cannot walk 20 ft without assistance, severe gait deviations or imbalance, deviates greater than 15 in outside 12 in walkway width or will not attempt task.   outside of base   Ambulating Backwards Walks 20 ft, no assistive devices, good speed, no evidence for imbalance, normal gait    Steps Alternating feet, must use rail.    Total Score 26    FGA comment: 26/30 = outside of falls risk            Loop Band Exercises - Side Stepping with Resistance at [employer] and Counter Support - 3 sets - Forward and Backward Monster Walk with Resistance at Ankles and Counter Support  - 3 sets - Standing Hip Abduction Kicks  - 10 reps - Standing Hip Extension Kicks  - 10 reps - Standing Hip Flexion with Resistance Loop - 10 reps - Seated Single Arm Shoulder External Rotation  with Self-Anchored Resistance  - 8-10 reps - External Rotation Reactive Isometrics with Flex Bar - 10 reps - Shoulder Elbow Flexion 90/90 with Band - 8-10 reps  PATIENT EDUCATION: Education details: [employer] handout and aquatic HEP modifications/progressions. Person educated: Patient Education method: Explanation Education comprehension: verbalized understanding and needs further education  HOME EXERCISE PROGRAM: Access Code: [email or web address] URL: [email or web address] Date: 02/10/2023 Prepared by: Maryruth Eve  Exercises - Sit to Stand Without Arm Support  - 1 x daily - 5 x weekly - 3 sets - 10 reps - Tandem Walking with Counter Support  - 1 x daily - 5 x weekly - 3 sets - Corner Balance Feet Together With Eyes Closed  - 1 x daily - 7 x weekly - 3 sets - 30 seconds hold - Corner Balance Feet Together: Eyes Closed With Head Turns  - 1 x daily - 7 x weekly - 3 sets -  10 reps - Kettlebell Deadlift  - 1 x daily - 7 x weekly - 3 sets - 10 reps - Lunge with Counter Support  - 1 x daily - 7 x weekly - 3 sets - 10 reps  Access Code: [email or web address] URL: [email or web address] Date: 04/14/2023 Prepared by: Maryruth Eve  Exercises - Side Stepping with Resistance at Thighs and Counter Support  - 1 x daily - 7 x weekly - 3 sets - Forward and Backward Monster Walk with Resistance at Ankles and Counter Support  - 1 x daily - 7 x weekly - 3 sets - Standing Hip Abduction Kicks  - 1 x daily - 7 x weekly - 3 sets - 10 reps - Standing Hip Extension Kicks  - 1 x daily - 7 x weekly - 3 sets - 10 reps - Standing Hip Flexion with Resistance Loop  - 1 x daily - 7 x weekly - 3 sets - 10 reps - Seated Single Arm Shoulder External Rotation with Self-Anchored Resistance  - 1 x daily - 7 x weekly - 3 sets - 8-10 reps - External Rotation Reactive Isometrics with Flex Bar  - 1 x daily - 7 x weekly - 3 sets - 10 reps - Shoulder Elbow Flexion 90/90 with Band  - 1 x daily - 7 x weekly - 2 sets - 8-10  reps  [id number] - pool HEP & Ai Chi Handout  GOALS: Goals reviewed with patient? Yes  SHORT TERM GOALS: Target date: 03/03/2023  Patient will demonstrate independence with initial HEP to continue to progress between physical therapy sessions.   Baseline: Progressing but patient will benefit from aquatic therapy to help manage LE symptoms; patient reports confidence in HEP Goal status: MET  2.  Functional Gait assessment to be assessed/ LTG goal written Baseline: Assessed on 7/15 Goal status: MET  3.  Patient will improve their 5x Sit to Stand score to less than 15 seconds without UE support to demonstrate a decreased risk for falls and improved LE strength.   Baseline: 17.2 seconds with thigh assist; improved 12.92 second without UE use Goal status: MET  LONG TERM GOALS: Target date: 03/31/2023  Patient will report demonstrate independence with final HEP in order to maintain current gains and continue to progress after physical therapy discharge.   Baseline: Reports feeling confident in home exercise program Goal status: MET  2.  Patient will improve ABC Score to 86% or greater to indicate improved self-reported confidence in balance and sense of steadiness.    Baseline: 72%; improved 95% Goal status: MET  3.  Patient will improve gait speed to 1.1 m/s or greater to indicate a reduced risk for falls.   Baseline: 0.93 m/s without AD; 1.3 m/s  Goal status: MET  4.  Patient will improve FGA to greater than 22/30 to indicate a decreased risk of falls and improved dynamic stability.    Baseline: 16/30; improved to 26/30 Goal status: MET  5.  Patient will improve their 5x Sit to Stand score to less than 12 seconds to demonstrate a decreased risk for falls and improved LE strength.   Baseline: 17.2 seconds with thigh assist; improved 12.92 seconds without UE use; improved 8.70 seconds Goal status: MET ASSESSMENT:  CLINICAL IMPRESSION:  Patient is D/C from PT at this time due to  maximal rehab potential. Patient achieved all LTGs and demonstrates independence in land/aquatic program. Patient no longer in falls risk category based on today's testing. Finished review of banded exercises. No  further PT exercises indicated at this time.  OBJECTIVE IMPAIRMENTS: Abnormal gait, decreased activity tolerance, decreased balance, decreased endurance, decreased mobility, difficulty walking, decreased strength, impaired sensation, and impaired UE functional use.   ACTIVITY LIMITATIONS: carrying, squatting, transfers, and locomotion level  PARTICIPATION LIMITATIONS: community activity and line dancing  PERSONAL FACTORS: Fitness and 1 comorbidity: see above  are also affecting patient's functional outcome.   REHAB POTENTIAL: Good  CLINICAL DECISION MAKING: Stable/uncomplicated  EVALUATION COMPLEXITY: Low  PLAN:  PT FREQUENCY: 1-2x/week  PT DURATION: 8 weeks  PLANNED INTERVENTIONS: Therapeutic exercises, Therapeutic activity, Neuromuscular re-education, Balance training, Gait training, Patient/Family education, Self Care, Aquatic Therapy, and Re-evaluation  PLAN FOR NEXT SESSION: NA - patient D/C with updated HEP  Maryruth Eve, PT, DPT 04/14/2023, 12:18 PM

## 2023-04-16 ENCOUNTER — Other Ambulatory Visit (INDEPENDENT_AMBULATORY_CARE_PROVIDER_SITE_OTHER): Payer: Self-pay | Admitting: Family Medicine

## 2023-04-16 DIAGNOSIS — E559 Vitamin D deficiency, unspecified: Secondary | ICD-10-CM

## 2023-04-21 ENCOUNTER — Ambulatory Visit: Payer: Medicare Other | Admitting: Occupational Therapy

## 2023-04-21 DIAGNOSIS — M6281 Muscle weakness (generalized): Secondary | ICD-10-CM

## 2023-04-21 DIAGNOSIS — R278 Other lack of coordination: Secondary | ICD-10-CM

## 2023-04-21 DIAGNOSIS — R29818 Other symptoms and signs involving the nervous system: Secondary | ICD-10-CM

## 2023-04-21 DIAGNOSIS — R208 Other disturbances of skin sensation: Secondary | ICD-10-CM

## 2023-04-21 NOTE — Therapy (Unsigned)
OUTPATIENT OCCUPATIONAL THERAPY NEURO TREATMENT & DISCHARGE NOTE  Patient Name: [name] MRN: [id number] DOB:05-21-1956, 67 y.o., female Today's Date: 04/21/2023  PCP: Chilton Greathouse, MD REFERRING PROVIDER: Suanne Marker, MD  END OF SESSION:  OT End of Session - 04/21/23 1106     Visit Number 10    Number of Visits 12   + evaluation   Date for OT Re-Evaluation 05/09/23    Authorization Type MEDICARE PART A AND B    Progress Note Due on Visit 12    OT Start Time 1106    OT Stop Time 1150    OT Time Calculation (min) 44 min    Equipment Utilized During Treatment E stim/NMES    Activity Tolerance Patient tolerated treatment well    Behavior During Therapy WFL for tasks assessed/performed             Past Medical History:  Diagnosis Date   ADD (attention deficit disorder)    Anxiety    Back pain    CHF (congestive heart failure) (HCC)    Constipation    Daytime somnolence 04/06/2013   DDD (degenerative disc disease), lumbar    Depression    Depression    GERD (gastroesophageal reflux disease)    Hypercholesteremia    Hypertension    IBS (irritable bowel syndrome)    Joint pain    OSA on CPAP 04/06/2013   Palpitations    Sleep apnea    Swelling of both lower extremities    Thyroid condition    Urinary incontinence    Past Surgical History:  Procedure Laterality Date   ABLATION  09/29/2017   BACK SURGERY  04/06/2019   BREAST EXCISIONAL BIOPSY Left    CATARACT EXTRACTION Bilateral    ELECTROPHYSIOLOGIC STUDY N/A 01/01/2016   Procedure: SVT Ablation;  Surgeon: Marinus Maw, MD;  Location: [facility] INVASIVE CV LAB;  Service: Cardiovascular;  Laterality: N/A;   HAND SURGERY Right    KNEE ARTHROPLASTY Left    NASAL SEPTUM SURGERY     RADICAL HYSTERECTOMY  2000   RIGHT HEART CATH N/A 05/22/2021   Procedure: RIGHT HEART CATH;  Surgeon: [place], Peter M, MD;  Location: [facility] INVASIVE CV LAB;  Service: Cardiovascular;  Laterality: N/A;   TONSILLECTOMY AND  ADENOIDECTOMY     Patient Active Problem List   Diagnosis Date Noted   Vitamin D deficiency 03/26/2023   Other fatigue 03/13/2023   SOBOE (shortness of breath on exertion) 03/13/2023   Gout 03/13/2023   Iron deficiency 03/13/2023   Unspecified severe protein-calorie malnutrition (HCC) 03/13/2023   Depression screen 03/13/2023   Generalized obesity 03/13/2023   Obesity (BMI 30-39.9) 03/13/2023   Guillain Barr syndrome (HCC) 01/16/2023   Coronary artery disease involving native coronary artery of native heart without angina pectoris 08/24/2021   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   Elevated coronary artery calcium score 12/06/2020   Mixed hyperlipidemia 10/12/2020   Primary hypertension 01/19/2020   Chronic diastolic (congestive) heart failure (HCC) 01/19/2020   Dyspnea on exertion 11/05/2019   Hiatal hernia 11/05/2019   PSVT (paroxysmal supraventricular tachycardia) 01/01/2016   Thyroid nodule 04/14/2013   OSA on CPAP 04/06/2013   Daytime somnolence 04/06/2013    ONSET DATE: Referral: 01/16/2023 Onset: 12/16/22  REFERRING DIAG: G61.0 (ICD-10-CM) - Guillain Barr syndrome  THERAPY DIAG:  Other disturbances of skin sensation  Other symptoms and signs involving the nervous system  Muscle weakness (generalized)  Other lack of coordination  Rationale for Evaluation and Treatment:  Rehabilitation  SUBJECTIVE:   SUBJECTIVE STATEMENT:   Patient can tell her hands are getting better but her feet continue to be most uncomfortable.  She is satisfied with her compensatory strategies and is good with discharge from OT today   Pt accompanied by: self  PERTINENT HISTORY:   MD report: 67 year old female evaluated for rapidly progressive ascending numbness and weakness.   Symptoms started around Dec 16, 2022.  Patient noticed a numbness sensation on her left forefoot and toes.  This progressed to the right foot and up her legs.  Within a week she had numbness and tingling  and weakness in her hands up to her elbows.  She went to the emergency room twice for evaluation.  She was diagnosed with neuropathy and was recommended to follow-up with neurology.   Of note patient had preceding upper respiratory infection symptoms Dec 12, 2022.  She also had RSV vaccination earlier in May.   Symptoms have plateaued since 01/04/23.  Using a walker at home.  She feels weakness in her hands, arms, legs.  No breathing issues.  No swallowing issues.  No prior similar symptoms.  PRECAUTIONS: No  WEIGHT BEARING RESTRICTIONS: No  PAIN:  Are you having pain? Yes: NPRS scale: 4 (hands) 6-7 (feet)/10 Pain location: Hands have improved/feet are worse Pain description: achy in fingertips versus tingling in the feet Aggravating factors:  hard to determine Relieving factors: Gabapentin /Water therapy  FALLS: Has patient fallen in last 6 months? 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 Grooming: MI UB Dressing: 2 weeks ago - she could not fasten bra LB Dressing: Still feels stiff when putting on socks and is still slower at this task.  Reports she can tie her laces now. Toileting: MI Bathing: Sitting to bath due to being unsteady Tub Shower transfers: MI  with walk in shower Equipment: Shower seat with back  IADLs: Shopping: Patient was initailly driven by her husband for about 3 weeks but is now drving and returning to community outings/tasks on her own  Light housekeeping: Can help do her laundry now. Meal Prep: Husband does meal prep (previously did so also) Community mobility: Was using walker until about 6/5 - 01/17/23 - staying at home during that time Medication management: Returning to ability to open pill containers Financial management: Not asked. Handwriting: 90% legible  MOBILITY STATUS: Independent  POSTURE COMMENTS:  rounded shoulders and forward head Sitting balance: WFL  ACTIVITY TOLERANCE: Activity tolerance: Fair  FUNCTIONAL OUTCOME MEASURES: TBD  UPPER EXTREMITY ROM:    Grossly WNL  UPPER EXTREMITY MMT:    Grossly 4-/5 throughout shoulders etc  HAND FUNCTION: Grip strength: Right: 21.7, 22.2  lbs; Left: 17.8, 18.2  lbs  Congenital L pinkie finger missing  and R pinkie finger PIP joint laterally flexed.  COORDINATION:  9 Hole Peg test: Right: 23.05  sec; Left: 24.11  sec (Patient does reports task feeling funny and awkward compared to normal).  SENSATION: Light touch: Impaired palmar surfaces are tingling even with different temperatures Diminished from wrist and below bilaterally for UEs palmar surfaces and below ankles   EDEMA: noted fingers being stiff and swollen and L toes  MUSCLE TONE: WFL  COGNITION: Overall cognitive status: Within functional limits for tasks assessed  VISION: Subjective report: H/O cataract removal.  Has different glasses for distance and for reading Baseline vision: Wears glasses for distance only and Wears glasses for reading only Visual history: cataracts  VISION ASSESSMENT: Not tested  Patient reports no difficulty with activities due to vision impairments  PERCEPTION: Not tested  PRAXIS: Not tested  EVALUATION OBSERVATIONS: Patient is a 67 y.o. female who was  seen today for occupational therapy evaluation for UE numbness and tingling with resulting weakness and incoordination as a result of Guillain Barre Syndrome. Hx includes recent dx of probable Guillain Barre Syndrome. Patient currently presents below baseline level of functioning demonstrating functional deficits and impairments as noted below. Pt would benefit from skilled OT services in the outpatient setting to work on impairments as noted below to help pt return to PLOF as able.       TODAY'S TREATMENT:                                                                                                                                  Therapeutic Activities:    Reviewed discharge instructions/considerations for UE strength, coordination and desensitization activities including possible foot bath for her feet, ongoing use of distracting sensory activities (for hands and feet) and continued water/pool activities as this seems to be most effective for both hands/feet.   Patient's grip strength retested with substantial improvement even in past 3 weeks R 43.2, 47.1, 47.3 Average 45.9 lbs versus @ Eval 22 lbs and 3 weeks ago 26 lbs L 32.8, 39.2, 40.3 Average 37.4 lbs versus @ Eval 18 lbs and 3 weeks ago 22.8 lbs  PATIENT EDUCATION: Education details: Discharge Education Person educated: Patient Education method: Explanation Education comprehension: verbalized understanding  HOME EXERCISE PROGRAM: 02/12/23 Putty Exercises: Access Code: [email or web address] 02/19/23 Coordination Activities with Images 02/25/23 Desensitization Program handout 03/04/23 Progression of Putty Exercises: Access Code: [email or web address]   GOALS: Goals reviewed with patient? Yes  SHORT TERM GOALS: Target date: 03/07/23  Patient will demonstrate UE strength/coordination HEP with 25% verbal cues or less for proper execution.  Baseline: To be provided Goal status: MET  2.  Patient will demonstrate improved sensory awareness/Stereognosis to  identify different items in her purse/pocket and different coins with vision occluded.  Baseline: Tingling & numbness in B palmar surfaces  Goal status: MET 04/01/23 - still has numbness in finger tips with ongoing tingling and numbness daily  3.  Patient will  improve pinch strength/coordination & sensation of B hands to increase ease & independence in tying shoelaces, and manage buttons with vision occluded Baseline: Self reported difficulty. Goal status: MET   LONG TERM GOALS: Target date: 04/04/23  Patient will demonstrate MI with sensory stimulation HEP with MI  Baseline: To be provided. Goal status: MET  2.  Patient will demonstrate at least 30 lbs R UE grip strength and 25 lbs L UE grip strength as needed to open jars and other containers.  Baseline: Rt 22 lbs; Lt 18 lbs Goal status: MET 04/01/23 Right: 27.7, 25.1, 25.1 Average: 26.0 lbs Left:21.8, 21.6, 24.9 Average: 22.8 lbs 04/22/23 R 43.2, 47.1, 47.3 Average 45.9 lbs L 32.8, 39.2, 40.3 Average 37.4 lbs  3.  Patient will demonstrate UE coordination to resume playing the ukulele  Baseline: Unable to move fingers for different cords by self report. Goal status: MET  ASSESSMENT:  CLINICAL IMPRESSION:  Patient is a 67 y.o. female who returned today for OT today due to UE/LE neuropathy and resulting coordination and strength deficits due to Guillain Barre Syndrome.  Pt has progressed well and is independent with HEP and self management of ongoing numbness, tingling and will be discharged from OT today.     PERFORMANCE DEFICITS: in functional skills including ADLs, coordination, dexterity, sensation, strength, flexibility, Fine motor control, decreased knowledge of precautions, and UE functional use.  IMPAIRMENTS: are limiting patient from ADLs, leisure, and social participation.   CO-MORBIDITIES: may have co-morbidities  that affects occupational performance. Patient will benefit from skilled OT to address above impairments and  improve overall function.  REHAB POTENTIAL: Excellent  PLAN:  OCCUPATIONAL THERAPY DISCHARGE SUMMARY  Visits from Start of Care: 10  Current functional level related to goals / functional outcomes: Pt has met all goals 3/3 short term and 3/3 long term, to satisfactory levels and is pleased with outcomes.   Remaining deficits: Pt has no more significant functional deficits although she has sensory changes and deficits which she tolerates and manages with medication and HEP ideas.   Education / Equipment: Pt has all needed materials and education. Pt understands how to continue on with self-management. See tx notes for more details.   Patient agrees to discharge due to max benefits received from outpatient occupational therapy at this time.    Victorino Sparrow, OT 04/21/2023, 12:13 PM

## 2023-04-24 ENCOUNTER — Ambulatory Visit (INDEPENDENT_AMBULATORY_CARE_PROVIDER_SITE_OTHER): Payer: Medicare Other | Admitting: Family Medicine

## 2023-04-24 ENCOUNTER — Encounter (INDEPENDENT_AMBULATORY_CARE_PROVIDER_SITE_OTHER): Payer: Self-pay | Admitting: Family Medicine

## 2023-04-24 VITALS — BP 131/85 | HR 88 | Temp 98.1°F | Ht 64.0 in | Wt 186.0 lb

## 2023-04-24 DIAGNOSIS — E669 Obesity, unspecified: Secondary | ICD-10-CM

## 2023-04-24 DIAGNOSIS — E559 Vitamin D deficiency, unspecified: Secondary | ICD-10-CM

## 2023-04-24 DIAGNOSIS — Z6831 Body mass index (BMI) 31.0-31.9, adult: Secondary | ICD-10-CM | POA: Diagnosis not present

## 2023-04-24 MED ORDER — VITAMIN D (ERGOCALCIFEROL) 1.25 MG (50000 UNIT) PO CAPS
50000.0000 [IU] | ORAL_CAPSULE | ORAL | 0 refills | Status: DC
Start: 2023-04-24 — End: 2023-05-07

## 2023-04-24 NOTE — Progress Notes (Signed)
[name], D.O.  ABFM, ABOM Specializing in Clinical Bariatric Medicine  Office located at: [address] [place], [place]  [address]     Assessment and Plan:   Medications Discontinued During This Encounter  Medication Reason   Vitamin D, Ergocalciferol, (DRISDOL) 1.25 MG (50000 UNIT) CAPS capsule Reorder     Meds ordered this encounter  Medications   Vitamin D, Ergocalciferol, (DRISDOL) 1.25 MG (50000 UNIT) CAPS capsule    Sig: Take 1 capsule (50,000 Units total) by mouth every 7 (seven) days.    Dispense:  4 capsule    Refill:  0    Vitamin D deficiency Assessment & Plan: Lab Results  Component Value Date   VD25OH 28.6 (L) 03/13/2023   Endorses taking ERGO 50,000 units once a week without any adverse effects. C/w weight loss therapy and high dose vitamin D - Will refill ERGO today.      Obesity (BMI 30-39.9)- Current BMI: 31.91 Generalized obesity with starting BMI 33.1 Assessment & Plan: Since last office visit on 04/09/23 patient's muscle mass [name] increased by 1.2 lb. Fat mass [name] decreased by 2.6 lb. Total body water [name] decreased by 1.2 lb.  Counseling done on how various foods will affect these numbers and how to maximize success  Total lbs lost to date: 6 lbs  Total weight loss percentage to date: 3.13%    No change to meal plan - see Subjective  I encouraged pt to use AI technology (e.g Chat GPT) to find low calorie/low carb/high protein versions of the foods she loves.   Provided her a calorie and protein goal (1100 - 1200 calories, 85 + grams protein) as a guide.  Recommended different protein sources pt can try: Tofu, Edamame beans, Lean sirloin steak, greek yogurt, etc...   Discussed moving her proteins around so that she can get in everything by the end of the day.   Behavioral Intervention Additional resources provided today: Bean soup recipe, Creamy chicken soup recipe & Category 2 meal plan.  Evidence-based interventions for health  behavior change were utilized today including the discussion of self monitoring techniques, problem-solving barriers and SMART goal setting techniques.   Regarding patient's less desirable eating habits and patterns, we employed the technique of small changes.  Pt will specifically work on: making foods she loves in a healthy manner & possibly adding an extra day of exercise next visit.    FOLLOW UP: Return in about 13 days (around 05/07/2023). She was informed of the importance of frequent follow up visits to maximize her success with intensive lifestyle modifications for her multiple health conditions.  Subjective:   Chief complaint: Obesity [name] is here to discuss her progress with her obesity treatment plan. She is on the Category 2 Plan and states she is following her eating plan approximately 75% of the time. She states she is doing water aerobics 60 minutes 1 day a wk & line dancing 60 minutes 1 day a wk.   Interval History:  [name] is here for a follow up office visit. Since last OV, [name] been doing well. Reports that its difficult to eat all the prescribed ounces of protein on the meal plan. Enjoyed trying some of the recipes provided by [place] on 9/11. Avoids seafood due to gout. [name] questions about protein bars/powder.   Review of Systems:  Pertinent positives were addressed with patient today.  Reviewed by clinician on day of visit: allergies, medications, problem list, medical history, surgical  history, family history, social history, and previous encounter notes.  Weight Summary and Biometrics   Weight Lost Since Last Visit: 1lb  Weight Gained Since Last Visit: 0lb   Vitals Temp: 98.1 F (36.7 C) BP: 131/85 Pulse Rate: 88 SpO2: 96 %   Anthropometric Measurements Height: 5\' 4"  (1.626 m) Weight: 186 lb (84.4 kg) BMI (Calculated): 31.91 Weight at Last Visit: 187lb Weight Lost Since Last Visit: 1lb Weight Gained Since Last Visit: 0lb Starting Weight:  192lb Total Weight Loss (lbs): 6 lb (2.722 kg) Peak Weight: 202lb   Body Composition  Body Fat %: 41.2 % Fat Mass (lbs): 76.6 lbs Muscle Mass (lbs): 103.8 lbs Total Body Water (lbs): 67.8 lbs Visceral Fat Rating : 12   Other Clinical Data Fasting: no Labs: no Today's Visit #: 4 Starting Date: 03/13/23   Objective:   PHYSICAL EXAM: Blood pressure 131/85, pulse 88, temperature 98.1 F (36.7 C), height 5\' 4"  (1.626 m), weight 186 lb (84.4 kg), SpO2 96%. Body mass index is 31.93 kg/m.  General: Well Developed, well nourished, and in no acute distress.  HEENT: Normocephalic, atraumatic Skin: Warm and dry, cap RF less 2 sec, good turgor Chest:  Normal excursion, shape, no gross abn Respiratory: speaking in full sentences, no conversational dyspnea NeuroM-Sk: Ambulates w/o assistance, moves * 4 Psych: A and O *3, insight good, mood-full  DIAGNOSTIC DATA REVIEWED:  BMET    Component Value Date/Time   NA 141 03/13/2023 1109   K 4.5 03/13/2023 1109   CL 104 03/13/2023 1109   CO2 21 03/13/2023 1109   GLUCOSE 88 03/13/2023 1109   GLUCOSE 152 (H) 01/02/2023 1200   BUN 17 03/13/2023 1109   CREATININE 0.98 03/13/2023 1109   CREATININE 0.84 12/27/2015 0833   CALCIUM 10.0 03/13/2023 1109   GFRNONAA >60 01/02/2023 1200   GFRAA 38 (L) 09/29/2015 1553   Lab Results  Component Value Date   HGBA1C 5.3 03/13/2023   Lab Results  Component Value Date   INSULIN 34.0 (H) 03/13/2023   Lab Results  Component Value Date   TSH 0.901 03/13/2023   CBC    Component Value Date/Time   WBC 6.8 03/13/2023 1109   WBC 8.6 01/02/2023 1200   RBC 5.34 (H) 03/13/2023 1109   RBC 5.63 (H) 01/02/2023 1200   HGB 14.6 03/13/2023 1109   HCT 46.9 (H) 03/13/2023 1109   PLT 219 03/13/2023 1109   MCV 88 03/13/2023 1109   MCH 27.3 03/13/2023 1109   MCH 27.2 01/02/2023 1200   MCHC 31.1 (L) 03/13/2023 1109   MCHC 30.9 01/02/2023 1200   RDW 13.3 03/13/2023 1109   Iron Studies    Component  Value Date/Time   IRON 77 03/13/2023 1109   TIBC 343 03/13/2023 1109   FERRITIN 59 03/13/2023 1109   IRONPCTSAT 22 03/13/2023 1109   Lipid Panel     Component Value Date/Time   CHOL 144 03/13/2023 1109   TRIG 105 03/13/2023 1109   HDL 52 03/13/2023 1109   CHOLHDL 2.8 03/13/2023 1109   LDLCALC 73 03/13/2023 1109   Hepatic Function Panel     Component Value Date/Time   PROT 7.4 03/13/2023 1109   ALBUMIN 4.6 03/13/2023 1109   AST 24 03/13/2023 1109   ALT 20 03/13/2023 1109   ALKPHOS 52 03/13/2023 1109   BILITOT 0.6 03/13/2023 1109   BILIDIR 0.15 11/14/2021 0912      Component Value Date/Time   TSH 0.901 03/13/2023 1109   TSH 2.070 10/30/2020 1417  Nutritional Lab Results  Component Value Date   VD25OH 28.6 (L) 03/13/2023    Attestations:   Patient was in the office today and time spent on visit including pre-visit chart review and post-visit care/coordination of care and electronic medical record documentation was 40 minutes. 50% of the time was in face to face counseling of this patient's medical condition(s) and providing education on treatment options to include the first-line treatment of diet and lifestyle modification.   I, Special Randolm Idol, acting as a Stage manager for [employer], DO., have compiled all relevant documentation for today's office visit on behalf of Thomasene Lot, DO, while in the presence of [employer], DO.  I have reviewed the above documentation for accuracy and completeness, and I agree with the above. [name], D.O.  The 21st Century Cures Act was signed into law in 2016 which includes the topic of electronic health records.  This provides immediate access to information in MyChart.  This includes consultation notes, operative notes, office notes, lab results and pathology reports.  If you have any questions about what you read please let us know at your next visit so we can discuss your concerns and take corrective action if  need be.  We are right here with you.

## 2023-04-28 ENCOUNTER — Encounter: Payer: Self-pay | Admitting: Occupational Therapy

## 2023-05-05 ENCOUNTER — Ambulatory Visit (INDEPENDENT_AMBULATORY_CARE_PROVIDER_SITE_OTHER): Payer: Medicare Other

## 2023-05-05 ENCOUNTER — Ambulatory Visit (INDEPENDENT_AMBULATORY_CARE_PROVIDER_SITE_OTHER): Payer: Medicare Other | Admitting: Podiatry

## 2023-05-05 ENCOUNTER — Encounter: Payer: Self-pay | Admitting: Podiatry

## 2023-05-05 VITALS — BP 132/80 | HR 57 | Temp 98.0°F | Resp 18 | Ht 64.0 in | Wt 186.0 lb

## 2023-05-05 DIAGNOSIS — M109 Gout, unspecified: Secondary | ICD-10-CM

## 2023-05-05 DIAGNOSIS — M779 Enthesopathy, unspecified: Secondary | ICD-10-CM

## 2023-05-05 DIAGNOSIS — M10072 Idiopathic gout, left ankle and foot: Secondary | ICD-10-CM

## 2023-05-05 DIAGNOSIS — M7751 Other enthesopathy of right foot: Secondary | ICD-10-CM | POA: Diagnosis not present

## 2023-05-05 DIAGNOSIS — M21619 Bunion of unspecified foot: Secondary | ICD-10-CM

## 2023-05-05 NOTE — Progress Notes (Signed)
Subjective:   Patient ID: [name], female   DOB: 67 y.o.   MRN: [id number]   HPI Chief Complaint  Patient presents with   Foot Pain    New patient: gout, numbness in toes, soles of feet and arch following Guillain barre other anatomical problems    67 year old female, with a history of Guillain Barre Syndrome, presents with ongoing issues with their feet. They describe a sensation of a mass in both feet, more pronounced in the left, and difficulty bending their feet. They also report numbness in their toes, which began with the onset of Guillain Barre Syndrome and has not resolved. The patient also describes a sensation of tightness around their feet, likening it to saran wrap, primarily in the left foot. They are unsure if these symptoms are lingering effects of Guillain Barre Syndrome or due to other issues.  In addition to these symptoms, the patient has a history of gout and is currently on allopurinol. They report still experiencing flare-ups, which they assume are due to gout. They also have a bunion on their foot and are unsure if their symptoms are due to the bunion or gout. The patient also reports numbness in their toes, which began with the onset of Guillain Barre Syndrome and has not resolved. They have an upcoming appointment with a neurologist to further evaluate these symptoms.   Review of Systems  All other systems reviewed and are negative.  Past Medical History:  Diagnosis Date   ADD (attention deficit disorder)    Anxiety    Back pain    CHF (congestive heart failure) (HCC)    Constipation    Daytime somnolence 04/06/2013   DDD (degenerative disc disease), lumbar    Depression    Depression    GERD (gastroesophageal reflux disease)    Hypercholesteremia    Hypertension    IBS (irritable bowel syndrome)    Joint pain    OSA on CPAP 04/06/2013   Palpitations    Sleep apnea    Swelling of both lower extremities    Thyroid condition    Urinary  incontinence     Past Surgical History:  Procedure Laterality Date   ABLATION  09/29/2017   BACK SURGERY  04/06/2019   BREAST EXCISIONAL BIOPSY Left    CATARACT EXTRACTION Bilateral    ELECTROPHYSIOLOGIC STUDY N/A 01/01/2016   Procedure: SVT Ablation;  Surgeon: Marinus Maw, MD;  Location: [facility] INVASIVE CV LAB;  Service: Cardiovascular;  Laterality: N/A;   HAND SURGERY Right    KNEE ARTHROPLASTY Left    NASAL SEPTUM SURGERY     RADICAL HYSTERECTOMY  2000   RIGHT HEART CATH N/A 05/22/2021   Procedure: RIGHT HEART CATH;  Surgeon: [place], Peter M, MD;  Location: [facility] INVASIVE CV LAB;  Service: Cardiovascular;  Laterality: N/A;   TONSILLECTOMY AND ADENOIDECTOMY       Current Outpatient Medications:    allopurinol (ZYLOPRIM) 100 MG tablet, Take 100 mg by mouth daily., Disp: , Rfl:    aspirin EC 81 MG tablet, Take 1 tablet (81 mg total) by mouth daily. SWALLOW WHOLE., Disp: 90 tablet, Rfl: 3   cetirizine (ZYRTEC) 10 MG tablet, Take 10 mg by mouth in the morning., Disp: , Rfl:    Cholecalciferol (VITAMIN D3) 50 MCG (2000 UT) TABS, Take 2,000 Units by mouth in the morning., Disp: , Rfl:    clobetasol (OLUX) 0.05 % topical foam, Apply 1 Application topically 2 (two) times daily., Disp: , Rfl:  Colchicine (COLCRYS PO), Take 0.6 mg by mouth. Was twice daily, now once daily, then taper, Disp: , Rfl:    ezetimibe (ZETIA) 10 MG tablet, TAKE 1 TABLET BY MOUTH EVERY DAY, Disp: 90 tablet, Rfl: 3   fluticasone (FLONASE) 50 MCG/ACT nasal spray, Place 2 sprays into both nostrils daily as needed for allergies., Disp: , Rfl:    gabapentin (NEURONTIN) 100 MG capsule, Take 1 capsule (100 mg total) by mouth 3 (three) times daily., Disp: 90 capsule, Rfl: 1   pravastatin (PRAVACHOL) 40 MG tablet, TAKE 1 TABLET BY MOUTH EVERY DAY IN THE EVENING, Disp: 90 tablet, Rfl: 3   spironolactone (ALDACTONE) 50 MG tablet, TAKE 1 TABLET BY MOUTH EVERY DAY, Disp: 90 tablet, Rfl: 3   venlafaxine XR (EFFEXOR-XR) 75 MG 24 hr  capsule, Take 75 mg by mouth daily., Disp: , Rfl:    Vitamin D, Ergocalciferol, (DRISDOL) 1.25 MG (50000 UNIT) CAPS capsule, Take 1 capsule (50,000 Units total) by mouth every 7 (seven) days., Disp: 4 capsule, Rfl: 0  Allergies  Allergen Reactions   Other Itching    Antiseptic Solution   Rosuvastatin Other (See Comments)          Objective:  Physical Exam  General: AAO x3, NAD  Dermatological: Skin is warm, dry and supple bilateral. There are no open sores, no preulcerative lesions, no rash or signs of infection present.  Vascular: Dorsalis Pedis artery and Posterior Tibial artery pedal pulses are 2/4 bilateral with immedate capillary fill time. . There is no pain with calf compression, swelling, warmth, erythema.   Neruologic: Grossly intact via light touch bilateral.   Musculoskeletal: Patient has palpation of the third interspace and small palpable neuroma versus bursa is present.  Unable to appreciate any area pinpoint tenderness.  No pain with MPJ range of motion today.  Mild bunion as well as hammertoes present.  MMT 5/5.  Gait: Unassisted, Nonantalgic.       Assessment:   History of Guillain-Barr with residual symptoms, neuroma/foot pain, history of gout     Plan:  Residual Guillain-Barre Syndrome Persistent numbness in toes and sensation of tightness around the arch of the foot. Likely contributing to the patient's current foot issues. -Continue current management and follow-up with neurologist as scheduled  Morton's Neuroma Tenderness and sensation of a mass in the left foot, likely contributing to the numbness and discomfort. -Use metatarsal pads to offload pressure and reduce inflammation.  This was dispensed today.  We discussed daily injection that we should be followed up on this. -Symptoms persist recommend MRI.  Gout History of flare-ups despite allopurinol treatment. No acute gout observed during the visit. -Check uric acid levels to assess the  effectiveness of current allopurinol dose. -Continue allopurinol 100mg  daily.  Bunion Present on the left foot, possibly contributing to discomfort but not the primary issue. -Advise on appropriate footwear to prevent inflammation and progression.  Hammer Toes Early signs observed. -Recommend toe exercises to maintain flexibility and strength.  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  Follow-up Patient to continue current management and report any changes or concerns via My Chart or phone call.    Radiology: 3 views left foot obtained.  There is no evidence of acute fracture.  There is flattening of the third metatarsal head with cystic changes along the metatarsal head on the left side.  Vivi Barrack DPM

## 2023-05-07 ENCOUNTER — Encounter (INDEPENDENT_AMBULATORY_CARE_PROVIDER_SITE_OTHER): Payer: Self-pay | Admitting: Family Medicine

## 2023-05-07 ENCOUNTER — Ambulatory Visit (INDEPENDENT_AMBULATORY_CARE_PROVIDER_SITE_OTHER): Payer: Medicare Other | Admitting: Family Medicine

## 2023-05-07 VITALS — BP 137/80 | HR 69 | Temp 97.9°F | Ht 64.0 in | Wt 184.0 lb

## 2023-05-07 DIAGNOSIS — E559 Vitamin D deficiency, unspecified: Secondary | ICD-10-CM

## 2023-05-07 DIAGNOSIS — E669 Obesity, unspecified: Secondary | ICD-10-CM

## 2023-05-07 DIAGNOSIS — Z6831 Body mass index (BMI) 31.0-31.9, adult: Secondary | ICD-10-CM | POA: Diagnosis not present

## 2023-05-07 MED ORDER — VITAMIN D (ERGOCALCIFEROL) 1.25 MG (50000 UNIT) PO CAPS
50000.0000 [IU] | ORAL_CAPSULE | ORAL | 0 refills | Status: DC
Start: 2023-05-07 — End: 2023-05-21

## 2023-05-07 NOTE — Progress Notes (Unsigned)
Chief Complaint:   OBESITY Genisis is here to discuss her progress with her obesity treatment plan along with follow-up of her obesity related diagnoses. [name] is on the Category 2 Plan and states she is following her eating plan approximately 75% of the time. [name] states she is doing Materials engineer and water aerobics for 60 minutes 3 times per week.  Today's visit was #: 5 Starting weight: 192 lbs Starting date: 03/13/2023 Today's weight: 184 lbs Today's date: 05/07/2023 Total lbs lost to date: 8 Total lbs lost since last in-office visit: 2  Interim History: Patient continues to work on her diet and weight loss. She is exercising regularly and her visceral fat is now below 12. She struggles to eat all of the protein on her plan.   Subjective:   1. Vitamin D deficiency Patient's recent Vitamin D level is lower than goal. She is doing well on her Vitamin D supplementations and she requests a refill.   Assessment/Plan:   1. Vitamin D deficiency Patient will continue prescription Vitamin D 50,000 IU once weekly, and we will refill for 1 month, and we will continue to follow.   - Vitamin D, Ergocalciferol, (DRISDOL) 1.25 MG (50000 UNIT) CAPS capsule; Take 1 capsule (50,000 Units total) by mouth every 7 (seven) days.  Dispense: 4 capsule; Refill: 0  2. BMI 31.0-31.9,adult  3. Obesity, Beginning BMI 32.96 [name] is currently in the action stage of change. As such, her goal is to continue with weight loss efforts. She has agreed to the Category 2 Plan.   Lean meat substitutions were discussed.   Exercise goals: As is.   Behavioral modification strategies: meal planning and cooking strategies.  [name] has agreed to follow-up with our clinic in 3 to 4 weeks. She was informed of the importance of frequent follow-up visits to maximize her success with intensive lifestyle modifications for her multiple health conditions.   Objective:   Blood pressure 137/80, pulse 69, temperature 97.9  F (36.6 C), height 5\' 4"  (1.626 m), weight 184 lb (83.5 kg), SpO2 96%. Body mass index is 31.58 kg/m.  Lab Results  Component Value Date   CREATININE 0.98 03/13/2023   BUN 17 03/13/2023   NA 141 03/13/2023   K 4.5 03/13/2023   CL 104 03/13/2023   CO2 21 03/13/2023   Lab Results  Component Value Date   ALT 20 03/13/2023   AST 24 03/13/2023   ALKPHOS 52 03/13/2023   BILITOT 0.6 03/13/2023   Lab Results  Component Value Date   HGBA1C 5.3 03/13/2023   Lab Results  Component Value Date   INSULIN 34.0 (H) 03/13/2023   Lab Results  Component Value Date   TSH 0.901 03/13/2023   Lab Results  Component Value Date   CHOL 144 03/13/2023   HDL 52 03/13/2023   LDLCALC 73 03/13/2023   TRIG 105 03/13/2023   CHOLHDL 2.8 03/13/2023   Lab Results  Component Value Date   VD25OH 28.6 (L) 03/13/2023   Lab Results  Component Value Date   WBC 6.8 03/13/2023   HGB 14.6 03/13/2023   HCT 46.9 (H) 03/13/2023   MCV 88 03/13/2023   PLT 219 03/13/2023   Lab Results  Component Value Date   IRON 77 03/13/2023   TIBC 343 03/13/2023   FERRITIN 59 03/13/2023   Attestation Statements:   Reviewed by clinician on day of visit: allergies, medications, problem list, medical history, surgical history, family history, social history, and previous encounter notes.  I, Burt Knack, am acting as transcriptionist for Quillian Quince, MD.  I have reviewed the above documentation for accuracy and completeness, and I agree with the above. -  Quillian Quince, MD

## 2023-05-08 ENCOUNTER — Ambulatory Visit (INDEPENDENT_AMBULATORY_CARE_PROVIDER_SITE_OTHER): Payer: Medicare Other | Admitting: Adult Health

## 2023-05-08 ENCOUNTER — Ambulatory Visit (INDEPENDENT_AMBULATORY_CARE_PROVIDER_SITE_OTHER): Payer: Medicare Other | Admitting: Family Medicine

## 2023-05-08 LAB — URIC ACID: Uric Acid: 5.9 mg/dL (ref 3.0–7.2)

## 2023-05-21 ENCOUNTER — Encounter (INDEPENDENT_AMBULATORY_CARE_PROVIDER_SITE_OTHER): Payer: Self-pay | Admitting: Family Medicine

## 2023-05-21 ENCOUNTER — Ambulatory Visit (INDEPENDENT_AMBULATORY_CARE_PROVIDER_SITE_OTHER): Payer: Medicare Other | Admitting: Family Medicine

## 2023-05-21 VITALS — BP 116/79 | HR 84 | Temp 98.0°F | Ht 64.0 in | Wt 182.0 lb

## 2023-05-21 DIAGNOSIS — M109 Gout, unspecified: Secondary | ICD-10-CM

## 2023-05-21 DIAGNOSIS — E559 Vitamin D deficiency, unspecified: Secondary | ICD-10-CM

## 2023-05-21 DIAGNOSIS — E669 Obesity, unspecified: Secondary | ICD-10-CM | POA: Diagnosis not present

## 2023-05-21 DIAGNOSIS — R252 Cramp and spasm: Secondary | ICD-10-CM

## 2023-05-21 DIAGNOSIS — Z6831 Body mass index (BMI) 31.0-31.9, adult: Secondary | ICD-10-CM

## 2023-05-21 MED ORDER — VITAMIN D (ERGOCALCIFEROL) 1.25 MG (50000 UNIT) PO CAPS
50000.0000 [IU] | ORAL_CAPSULE | ORAL | 0 refills | Status: DC
Start: 2023-05-21 — End: 2023-06-19

## 2023-05-21 NOTE — Progress Notes (Signed)
Carlye Grippe, D.O.  ABFM, ABOM Specializing in Clinical Bariatric Medicine  Office located at: [address] [place], [place]  [address]     Assessment and Plan:   Medications Discontinued During This Encounter  Medication Reason   Cholecalciferol (VITAMIN D3) 50 MCG (2000 UT) TABS    Vitamin D, Ergocalciferol, (DRISDOL) 1.25 MG (50000 UNIT) CAPS capsule Reorder   Meds ordered this encounter  Medications   Vitamin D, Ergocalciferol, (DRISDOL) 1.25 MG (50000 UNIT) CAPS capsule    Sig: Take 1 capsule (50,000 Units total) by mouth every 7 (seven) days.    Dispense:  4 capsule    Refill:  0    Vitamin D deficiency Assessment: Condition is Not at goal.. Patient reports good compliance and tolerance of taking ERGO once weekly. She denies any adverse side effects.  Lab Results  Component Value Date   VD25OH 28.6 (L) 03/13/2023   Plan: - Continue with Ergocalciferol 50K IU as directed and she was encouraged to continue to take the medicine until told otherwise.  I will refill today.   - With Winter approaching her vitamin D levels are prone to drop so we will need to monitor levels regularly to keep levels within normal limits and prevent over supplementation.   b/l Leg cramps Assessment: Condition is Not optimized.. Pt informed me that for the last 3-4 weeks she began getting cramps while doing water aerobics. She drinks about 65oz daily but not before or during when she exercises.   Plan: - Unless pre-existing renal or cardiopulmonary conditions exist which patient was told to limit their fluid intake by another provider, I recommended roughly one half of their weight in pounds, to be the approximate ounces of non-caloric, non-caffeinated beverages they should drink per day; including more if they are engaging in exercise. Increasing water intake and physical activity will help alleviate constipation symptoms.   - I recommended that she begin stretching before exercises.    - I also recommended the Calm supplement that contains magnesium to help aid in her bilateral leg cramps.    Gout involving toe, unspecified cause, unspecified chronicity, unspecified laterality Assessment: Condition is Not optimized.. Pt informed me that she went to the ER  as she developed gout in a couple of toes. She states that when she eats too much meat she will develop this. She takes Allopurinol daily and Colcrys prn.   Plan: - Continue with Allopurinol 100mg  daily and Colcrys 0.6mg  prn.   - Limit her beef and shellfish intake.    TREATMENT PLAN FOR OBESITY: Obesity (BMI 30-39.9)- Current BMI: 31.22 Obesity, Beginning BMI 32.96 Assessment:  [name] is here to discuss her progress with her obesity treatment plan along with follow-up of her obesity related diagnoses. See Medical Weight Management Flowsheet for complete bioelectrical impedance results.  Condition is docourse: improving. Biometric data collected today, was reviewed with patient.   Since last office visit on 05/07/2023 patient's  Muscle mass has increased by 0.6lb. Fat mass has decreased by 1.8lb. Total body water has decreased by 1.2lb.  Counseling done on how various foods will affect these numbers and how to maximize success  Total lbs lost to date: 10 Total weight loss percentage to date: 5.21%   - She endorses that her hunger and cravings are improved but not controlled.   Plan: - Continue to follow the Category 2 meal plan.    Behavioral Intervention Additional resources provided today:  Sources on protein and complex carbs  handout Evidence-based interventions for health behavior change were utilized today including the discussion of self monitoring techniques, problem-solving barriers and SMART goal setting techniques.   Regarding patient's less desirable eating habits and patterns, we employed the technique of small changes.  Pt will specifically work on: Increase water intake and eat more  healthy proteins for next visit.     She has agreed to Continue current level of physical activity    FOLLOW UP: Return in about 2 weeks (around 06/04/2023).  She was informed of the importance of frequent follow up visits to maximize her success with intensive lifestyle modifications for her multiple health conditions.  Subjective:   Chief complaint: Obesity [name] is here to discuss her progress with her obesity treatment plan. She is on the the Category 2 Plan and states she is following her eating plan approximately 75% of the time. She states she is going to water aerobics 60 minutes 3-4 days per week.  Interval History:  [name] is here for a follow up office visit.     Since last office visit:  She inquired about what beef jerky to eat as there are so many brands and she never had it before. She informed me that she ate sliced roast beef for a week straight which made her gout flare up. She informed me that eating even the simplest seafood will flare her gout and she does not like eggs. She has tried and liked [place]. She informed me that she is going on a tip for 4 days and wondered how she would plan to eat there besides bringing her own food.   We reviewed her meal plan and all questions were answered.   Review of Systems:  Pertinent positives were addressed with patient today.  Reviewed by clinician on day of visit: allergies, medications, problem list, medical history, surgical history, family history, social history, and previous encounter notes.  Weight Summary and Biometrics   Weight Lost Since Last Visit: 2lb  Weight Gained Since Last Visit: 0   Vitals Temp: 98 F (36.7 C) BP: 116/79 Pulse Rate: 84 SpO2: 97 %   Anthropometric Measurements Height: 5\' 4"  (1.626 m) Weight: 182 lb (82.6 kg) BMI (Calculated): 31.22 Weight at Last Visit: 184lb Weight Lost Since Last Visit: 2lb Weight Gained Since Last Visit: 0 Starting Weight: 192lb Total  Weight Loss (lbs): 10 lb (4.536 kg) Peak Weight: 202lb   Body Composition  Body Fat %: 40.1 % Fat Mass (lbs): 73.4 lbs Muscle Mass (lbs): 103.8 lbs Total Body Water (lbs): 66.4 lbs Visceral Fat Rating : 11   Other Clinical Data Fasting: No Labs: No Today's Visit #: 6 Starting Date: 03/13/23     Objective:   PHYSICAL EXAM: Blood pressure 116/79, pulse 84, temperature 98 F (36.7 C), height 5\' 4"  (1.626 m), weight 182 lb (82.6 kg), SpO2 97%. Body mass index is 31.24 kg/m.  General: Well Developed, well nourished, and in no acute distress.  HEENT: Normocephalic, atraumatic Skin: Warm and dry, cap RF less 2 sec, good turgor Chest:  Normal excursion, shape, no gross abn Respiratory: speaking in full sentences, no conversational dyspnea NeuroM-Sk: Ambulates w/o assistance, moves * 4 Psych: A and O *3, insight good, mood-full  DIAGNOSTIC DATA REVIEWED:  BMET    Component Value Date/Time   NA 141 03/13/2023 1109   K 4.5 03/13/2023 1109   CL 104 03/13/2023 1109   CO2 21 03/13/2023 1109   GLUCOSE 88 03/13/2023 1109  GLUCOSE 152 (H) 01/02/2023 1200   BUN 17 03/13/2023 1109   CREATININE 0.98 03/13/2023 1109   CREATININE 0.84 12/27/2015 0833   CALCIUM 10.0 03/13/2023 1109   GFRNONAA >60 01/02/2023 1200   GFRAA 38 (L) 09/29/2015 1553   Lab Results  Component Value Date   HGBA1C 5.3 03/13/2023   Lab Results  Component Value Date   INSULIN 34.0 (H) 03/13/2023   Lab Results  Component Value Date   TSH 0.901 03/13/2023   CBC    Component Value Date/Time   WBC 6.8 03/13/2023 1109   WBC 8.6 01/02/2023 1200   RBC 5.34 (H) 03/13/2023 1109   RBC 5.63 (H) 01/02/2023 1200   HGB 14.6 03/13/2023 1109   HCT 46.9 (H) 03/13/2023 1109   PLT 219 03/13/2023 1109   MCV 88 03/13/2023 1109   MCH 27.3 03/13/2023 1109   MCH 27.2 01/02/2023 1200   MCHC 31.1 (L) 03/13/2023 1109   MCHC 30.9 01/02/2023 1200   RDW 13.3 03/13/2023 1109   Iron Studies    Component Value  Date/Time   IRON 77 03/13/2023 1109   TIBC 343 03/13/2023 1109   FERRITIN 59 03/13/2023 1109   IRONPCTSAT 22 03/13/2023 1109   Lipid Panel     Component Value Date/Time   CHOL 144 03/13/2023 1109   TRIG 105 03/13/2023 1109   HDL 52 03/13/2023 1109   CHOLHDL 2.8 03/13/2023 1109   LDLCALC 73 03/13/2023 1109   Hepatic Function Panel     Component Value Date/Time   PROT 7.4 03/13/2023 1109   ALBUMIN 4.6 03/13/2023 1109   AST 24 03/13/2023 1109   ALT 20 03/13/2023 1109   ALKPHOS 52 03/13/2023 1109   BILITOT 0.6 03/13/2023 1109   BILIDIR 0.15 11/14/2021 0912      Component Value Date/Time   TSH 0.901 03/13/2023 1109   TSH 2.070 10/30/2020 1417   Nutritional Lab Results  Component Value Date   VD25OH 28.6 (L) 03/13/2023    Attestations:   I, Clinical biochemist, acting as a Stage manager for [employer], DO., have compiled all relevant documentation for today's office visit on behalf of Thomasene Lot, DO, while in the presence of [employer], DO.  I have reviewed the above documentation for accuracy and completeness, and I agree with the above. Carlye Grippe, D.O.  The 21st Century Cures Act was signed into law in 2016 which includes the topic of electronic health records.  This provides immediate access to information in MyChart.  This includes consultation notes, operative notes, office notes, lab results and pathology reports.  If you have any questions about what you read please let us know at your next visit so we can discuss your concerns and take corrective action if need be.  We are right here with you.

## 2023-06-04 ENCOUNTER — Encounter (INDEPENDENT_AMBULATORY_CARE_PROVIDER_SITE_OTHER): Payer: Self-pay | Admitting: Internal Medicine

## 2023-06-04 ENCOUNTER — Ambulatory Visit (INDEPENDENT_AMBULATORY_CARE_PROVIDER_SITE_OTHER): Payer: Medicare Other | Admitting: Internal Medicine

## 2023-06-04 VITALS — BP 124/83 | HR 80 | Temp 98.0°F | Ht 64.0 in | Wt 180.0 lb

## 2023-06-04 DIAGNOSIS — Z6830 Body mass index (BMI) 30.0-30.9, adult: Secondary | ICD-10-CM

## 2023-06-04 DIAGNOSIS — E88819 Insulin resistance, unspecified: Secondary | ICD-10-CM | POA: Diagnosis not present

## 2023-06-04 DIAGNOSIS — K760 Fatty (change of) liver, not elsewhere classified: Secondary | ICD-10-CM

## 2023-06-04 DIAGNOSIS — E669 Obesity, unspecified: Secondary | ICD-10-CM

## 2023-06-04 DIAGNOSIS — G4733 Obstructive sleep apnea (adult) (pediatric): Secondary | ICD-10-CM

## 2023-06-04 NOTE — Assessment & Plan Note (Signed)
 See obesity treatment plan

## 2023-06-04 NOTE — Assessment & Plan Note (Signed)
I reviewed previous CT imaging she had a CT scan in 2018 that showed hepatic steatosis which was diffuse.  She has normal liver enzymes, bilirubin and albumin and normal platelet count.  Patient is new to me I will review records further to see if she has had a first tier evaluation which involves hepatitis neurology's and iron studies.  Also checking a PT and INR if it has not been checked in the past.  We discussed the role of saturated fats as well as simple and added sugars in fatty liver.  She will continue to work on implementation of reduced calorie nutrition plan.  She may also benefit from incretin therapy.  Losing 15% of body weight will improve condition

## 2023-06-04 NOTE — Progress Notes (Signed)
Office: [phone]  /  Fax: [phone]  WEIGHT SUMMARY AND BIOMETRICS  Vitals Temp: 98 F (36.7 C) BP: 124/83 Pulse Rate: 80 SpO2: 97 %   Anthropometric Measurements Height: 5\' 4"  (1.626 m) Weight: 180 lb (81.6 kg) BMI (Calculated): 30.88 Weight at Last Visit: 182 lb Weight Lost Since Last Visit: 2 lb Weight Gained Since Last Visit: 0 lb Starting Weight: 192 lb Total Weight Loss (lbs): 12 lb (5.443 kg) Peak Weight: 202 lb   Body Composition  Body Fat %: 40.5 % Fat Mass (lbs): 73.2 lbs Muscle Mass (lbs): 102.2 lbs Total Body Water (lbs): 65.8 lbs Visceral Fat Rating : 11    No data recorded Today's Visit #: 7  Starting Date: 03/13/23   HPI  Chief Complaint: OBESITY  Maanasa is here to discuss her progress with her obesity treatment plan. She is on the the Category 2 Plan and states she is following her eating plan approximately 75 % of the time. She states she is exercising 60 minutes 4 times per week and line dancing once a week for 60 minutes.  Interval History:  Discussed the use of AI scribe software for clinical note transcription with the patient, who gave verbal consent to proceed.  History of Present Illness   This is my first encounter with Mrs. Haworth.  She is a pleasant 67 year old female  with a history of hypertension, diastolic heart failure, pulmonary artery aneurysm, coronary artery disease, sleep apnea managed with CPAP, and Guillain Barre syndrome, presents for a follow-up consultation for weight management .The patient has been following a category two plan, adhering to it about 75% of the time, and exercising for 60 minutes four times a week, primarily through water aerobics and line dancing.  The patient has lost 12 pounds since the start of the program, with a peak weight of 202 pounds. The patient's weight at the start of the program was 195 pounds, and the current weight is 180 pounds. The patient has been losing weight at a steady and  healthy rate, with improvements in SAT and VAT. The patient has also managed to preserve muscle mass during this weight loss journey.      Orexigenic Control: Denies problems with appetite and hunger signals.  Denies problems with satiety and satiation.  Denies problems with eating patterns and portion control.  Denies abnormal cravings. Reports feeling deprived or restricted.  She feels very limited and would like to incorporate other foods.   Barriers identified: none.   Pharmacotherapy for weight loss: She is currently taking no anti-obesity medication and patient has declined pharmacotherapy in past.    ASSESSMENT AND PLAN  TREATMENT PLAN FOR OBESITY:  Recommended Dietary Goals  Sharlie is currently in the action stage of change. As such, her goal is to continue weight management plan. She has agreed to: continue current plan.  Patient counseled on breakfast alternatives including sources of protein.  She was also educated on glycemic index and glycemic load and ways to incorporate some of the foods that she like while maintaining adequate glycemic control.  Behavioral Intervention  We discussed the following Behavioral Modification Strategies today: continue to work on maintaining a reduced calorie state, getting the recommended amount of protein, incorporating whole foods, making healthy choices, staying well hydrated and practicing mindfulness when eating..  Additional resources provided today: Handout on how to make protein smoothies.  She was also provided with instructions on how to use [email or web address] for recipe ideas and cooking in bulk.  Recommended Physical Activity Goals  Amyrah has been advised to work up to 150 minutes of moderate intensity aerobic activity a week and strengthening exercises 2-3 times per week for cardiovascular health, weight loss maintenance and preservation of muscle mass.   She has agreed to :  Think about enjoyable ways to increase daily  physical activity and overcoming barriers to exercise and Increase physical activity in their day and reduce sedentary time (increase NEAT).  Pharmacotherapy We discussed various medication options to help Arvie with her weight loss efforts and we both agreed to : continue with nutritional and behavioral strategies  ASSOCIATED CONDITIONS ADDRESSED TODAY  Obesity (BMI 30-39.9) Assessment & Plan: See obesity treatment plan   OSA on CPAP Assessment & Plan: On CPAP with reported good compliance. Continue PAP therapy. Losing 15% or more of body weight may improve AHI.      Insulin resistance Assessment & Plan: Her HOMA-IR is 7.38 which is elevated. Optimal level < 1.9.   This is complex condition associated with genetics, ectopic fat and lifestyle factors. Insulin resistance may also result in weight gain, abnormal cravings (particularly for carbs) and fatigue.   This may result in additional weight gain and lead to pre-diabetes and diabetes if untreated. In addition, hyperinsulinemia increases cardiovascular risk, chronic inflammatory response and may increase the risk of obesity related malignancies.  Lab Results  Component Value Date   HGBA1C 5.3 03/13/2023   Lab Results  Component Value Date   INSULIN 34.0 (H) 03/13/2023   Lab Results  Component Value Date   GLUCOSE 88 03/13/2023   GLUCOSE 83 09/29/2015    We reviewed treatment options which include losing 7 to 10% of body weight, increasing physical activity to a 150 minutes a week at moderate intensity.  She benefits from reducing simple and processed carbs in diet. Patient counseled on GI and GL today.  She may also be a candidate for pharmacoprophylaxis with metformin or incretin mimetic.     Metabolic dysfunction-associated steatotic liver disease (MASLD) Assessment & Plan: I reviewed previous CT imaging she had a CT scan in 2018 that showed hepatic steatosis which was diffuse.  She has normal liver enzymes,  bilirubin and albumin and normal platelet count.  Patient is new to me I will review records further to see if she has had a first tier evaluation which involves hepatitis neurology's and iron studies.  Also checking a PT and INR if it has not been checked in the past.  We discussed the role of saturated fats as well as simple and added sugars in fatty liver.  She will continue to work on implementation of reduced calorie nutrition plan.  She may also benefit from incretin therapy.  Losing 15% of body weight will improve condition      PHYSICAL EXAM:  Blood pressure 124/83, pulse 80, temperature 98 F (36.7 C), height 5\' 4"  (1.626 m), weight 180 lb (81.6 kg), SpO2 97%. Body mass index is 30.9 kg/m.  General: She is overweight, cooperative, alert, well developed, and in no acute distress. PSYCH: Has normal mood, affect and thought process.   HEENT: EOMI, sclerae are anicteric. Lungs: Normal breathing effort, no conversational dyspnea. Extremities: No edema.  Neurologic: No gross sensory or motor deficits. No tremors or fasciculations noted.    DIAGNOSTIC DATA REVIEWED:  BMET    Component Value Date/Time   NA 141 03/13/2023 1109   K 4.5 03/13/2023 1109   CL 104 03/13/2023 1109   CO2 21 03/13/2023 1109  GLUCOSE 88 03/13/2023 1109   GLUCOSE 152 (H) 01/02/2023 1200   BUN 17 03/13/2023 1109   CREATININE 0.98 03/13/2023 1109   CREATININE 0.84 12/27/2015 0833   CALCIUM 10.0 03/13/2023 1109   GFRNONAA >60 01/02/2023 1200   GFRAA 38 (L) 09/29/2015 1553   Lab Results  Component Value Date   HGBA1C 5.3 03/13/2023   Lab Results  Component Value Date   INSULIN 34.0 (H) 03/13/2023   Lab Results  Component Value Date   TSH 0.901 03/13/2023   CBC    Component Value Date/Time   WBC 6.8 03/13/2023 1109   WBC 8.6 01/02/2023 1200   RBC 5.34 (H) 03/13/2023 1109   RBC 5.63 (H) 01/02/2023 1200   HGB 14.6 03/13/2023 1109   HCT 46.9 (H) 03/13/2023 1109   PLT 219 03/13/2023 1109    MCV 88 03/13/2023 1109   MCH 27.3 03/13/2023 1109   MCH 27.2 01/02/2023 1200   MCHC 31.1 (L) 03/13/2023 1109   MCHC 30.9 01/02/2023 1200   RDW 13.3 03/13/2023 1109   Iron Studies    Component Value Date/Time   IRON 77 03/13/2023 1109   TIBC 343 03/13/2023 1109   FERRITIN 59 03/13/2023 1109   IRONPCTSAT 22 03/13/2023 1109   Lipid Panel     Component Value Date/Time   CHOL 144 03/13/2023 1109   TRIG 105 03/13/2023 1109   HDL 52 03/13/2023 1109   CHOLHDL 2.8 03/13/2023 1109   LDLCALC 73 03/13/2023 1109   Hepatic Function Panel     Component Value Date/Time   PROT 7.4 03/13/2023 1109   ALBUMIN 4.6 03/13/2023 1109   AST 24 03/13/2023 1109   ALT 20 03/13/2023 1109   ALKPHOS 52 03/13/2023 1109   BILITOT 0.6 03/13/2023 1109   BILIDIR 0.15 11/14/2021 0912      Component Value Date/Time   TSH 0.901 03/13/2023 1109   TSH 2.070 10/30/2020 1417   Nutritional Lab Results  Component Value Date   VD25OH 28.6 (L) 03/13/2023     Return in about 3 weeks (around 06/25/2023) for For Weight Mangement with Dr. Rikki Spearing.Marland Kitchen She was informed of the importance of frequent follow up visits to maximize her success with intensive lifestyle modifications for her multiple health conditions.   ATTESTASTION STATEMENTS:  Reviewed by clinician on day of visit: allergies, medications, problem list, medical history, surgical history, family history, social history, and previous encounter notes.   I have spent 40 minutes in the care of the patient today including: preparing to see patient (e.g. review and interpretation of tests, old notes ), obtaining and/or reviewing separately obtained history, performing a medically appropriate examination or evaluation, counseling and educating the patient, documenting clinical information in the electronic or other health care record, and independently interpreting results and communicating results to the patient, family, or caregiver   Worthy Rancher,  MD

## 2023-06-04 NOTE — Assessment & Plan Note (Signed)
On CPAP with reported good compliance. Continue PAP therapy. Losing 15% or more of body weight may improve AHI.    

## 2023-06-04 NOTE — Assessment & Plan Note (Signed)
Her HOMA-IR is 7.38 which is elevated. Optimal level < 1.9.   This is complex condition associated with genetics, ectopic fat and lifestyle factors. Insulin resistance may also result in weight gain, abnormal cravings (particularly for carbs) and fatigue.   This may result in additional weight gain and lead to pre-diabetes and diabetes if untreated. In addition, hyperinsulinemia increases cardiovascular risk, chronic inflammatory response and may increase the risk of obesity related malignancies.  Lab Results  Component Value Date   HGBA1C 5.3 03/13/2023   Lab Results  Component Value Date   INSULIN 34.0 (H) 03/13/2023   Lab Results  Component Value Date   GLUCOSE 88 03/13/2023   GLUCOSE 83 09/29/2015    We reviewed treatment options which include losing 7 to 10% of body weight, increasing physical activity to a 150 minutes a week at moderate intensity.  She benefits from reducing simple and processed carbs in diet. Patient counseled on GI and GL today.  She may also be a candidate for pharmacoprophylaxis with metformin or incretin mimetic.

## 2023-06-18 ENCOUNTER — Ambulatory Visit (INDEPENDENT_AMBULATORY_CARE_PROVIDER_SITE_OTHER): Payer: Medicare Other | Admitting: Family Medicine

## 2023-06-19 ENCOUNTER — Ambulatory Visit (INDEPENDENT_AMBULATORY_CARE_PROVIDER_SITE_OTHER): Payer: Medicare Other | Admitting: Physician Assistant

## 2023-06-19 ENCOUNTER — Encounter (INDEPENDENT_AMBULATORY_CARE_PROVIDER_SITE_OTHER): Payer: Self-pay | Admitting: Physician Assistant

## 2023-06-19 VITALS — BP 124/87 | HR 88 | Temp 97.8°F | Ht 64.0 in | Wt 177.0 lb

## 2023-06-19 DIAGNOSIS — E669 Obesity, unspecified: Secondary | ICD-10-CM

## 2023-06-19 DIAGNOSIS — E88819 Insulin resistance, unspecified: Secondary | ICD-10-CM

## 2023-06-19 DIAGNOSIS — E559 Vitamin D deficiency, unspecified: Secondary | ICD-10-CM

## 2023-06-19 DIAGNOSIS — Z6830 Body mass index (BMI) 30.0-30.9, adult: Secondary | ICD-10-CM

## 2023-06-19 DIAGNOSIS — K582 Mixed irritable bowel syndrome: Secondary | ICD-10-CM

## 2023-06-19 MED ORDER — VITAMIN D (ERGOCALCIFEROL) 1.25 MG (50000 UNIT) PO CAPS: 50000.0000 [IU] | ORAL_CAPSULE | ORAL | 0 refills | Status: DC

## 2023-06-19 NOTE — Progress Notes (Unsigned)
SUBJECTIVE:  Chief Complaint: Obesity  History of Present Illness       Discussed the use of AI scribe software for clinical note transcription with the patient, who gave verbal consent to proceed. Interim history: [name] is down 3 lbs since last visit  Interim History:  History of Present Illness   [name], a 67 year old individual with a history of insulin resistance, vitamin D deficiency, mixed hyperlipidemia, and obesity, presents for a follow-up visit regarding her obesity treatment plan. The patient reports ongoing struggles with irritable bowel syndrome, characterized by alternating episodes of diarrhea and constipation. She expresses uncertainty about whether her current dietary changes have exacerbated these symptoms.  The patient has been adhering to a high-protein diet as part of her obesity treatment plan, which she suspects may be contributing to her constipation. She reports sporadic use of fiber supplements due to the unpredictable nature of her bowel movements. The patient has experienced episodes of sudden diarrhea, necessitating immediate access to a restroom, and periods of constipation severe enough to require emergency room visits for manual disimpaction.  The patient also reports frequent gas pains and expresses interest in food sensitivity testing, although she has not been able to identify any specific food triggers for her symptoms. She has a history of consultations with a gastroenterologist, but her previous provider has retired, and she is currently seeking a new one.  The patient has been taking a probiotic [employer]) intermittently, based on the advice of her previous gastroenterologist. She expresses uncertainty about whether continuous use would be more beneficial.  In terms of dietary habits, the patient reports a low intake of fruits and vegetables, primarily consuming rice cauliflower, frozen steamed broccoli, and frozen green beans. She has recently  reduced her consumption of apples. The patient reports a daily water intake of approximately 75 ounces, which she notes has led to frequent urination and occasional urinary urgency to the point of incontinence.  The patient also has a history of gout, which has influenced her dietary choices, particularly her protein sources. She primarily consumes chicken and a dish she refers to as "meat pizza," which consists of thin meat cooked with tomatoes.  The patient has been engaging in regular exercise, including water aerobics and line dancing, for a total of four hours per week. She had a temporary break from these activities due to a diagnosis of Guillain-Barr syndrome, for which she received five days of immunoglobulin injections.  In summary, the patient presents with ongoing struggles with irritable bowel syndrome amidst her obesity treatment plan, with concerns about the impact of her high-protein diet on her bowel movements. She is seeking strategies to manage her symptoms and improve her gut health while maintaining her protein intake for muscle mass preservation.       [name] is here to discuss her progress with her obesity treatment plan. She is on the Category 2 Plan and states she is following her eating plan approximately 80 % of the time. She states she is exercising water aerobics 60 minutes 3 times per week.   OBJECTIVE: Visit Diagnoses: Problem List Items Addressed This Visit     Generalized obesity   BMI 30.0-30.9,adult   Vitamin D deficiency   Relevant Medications   Vitamin D, Ergocalciferol, (DRISDOL) 1.25 MG (50000 UNIT) CAPS capsule   Insulin resistance - Primary   Irritable bowel syndrome with both constipation and diarrhea   Insulin Resistance Last fasting insulin was 5.3- at goal. A1c was 34.0 - significantly elevated. Polyphagia:No Medication(s):  None She is working on a nutrition plan to decrease simple carbohydrates, increase lean proteins and exercise to promote  weight loss, improve glycemic control and prevent progression to Type 2 diabetes.   Lab Results  Component Value Date   HGBA1C 5.3 03/13/2023   Lab Results  Component Value Date   INSULIN 34.0 (H) 03/13/2023    Plan:  Continue working on nutrition plan to decrease simple carbohydrates, increase lean proteins and exercise to promote weight loss, improve glycemic control and prevent progression to Type 2 diabetes.  Recheck fasting insulin and A1c levels over the next 1-2 months.   Vitamin D Deficiency Vitamin D is not at goal of 50.  Most recent vitamin D level was 28.6. She is on  prescription ergocalciferol 50,000 IU weekly. No N/V or muscle weakness with Ergocalciferol.  Lab Results  Component Value Date   VD25OH 28.6 (L) 03/13/2023    Plan: Continue and refill  prescription ergocalciferol 50,000 IU weekly Low vitamin D levels can be associated with adiposity and may result in leptin resistance and weight gain. Also associated with fatigue. Currently on vitamin D supplementation without any adverse effects.  Recheck vitamin D level several times yearly to optimize supplementation/avoid over supplementation.   IBS with constipation alternating with diarrhea: She has been followed in the past by Dr. Matthias Hughs of GI but no GI follow up since Dr. Lucy Antigua retired.  Has resumed taking Align probiotics recently, but has noted increased problems with constipation on high protein diet.  We discussed alternate sources of protein which also may provide more fiber like various legumes.  We also discussed adequate fluid/water intake with goal of 80-100 oz of fluids daily.  Also discussed taking psyllium husk capsules to increase fiber starting with 1-2 capsules daily and slowly increasing to 4 daily.  She did note fewer problems when incorporating apples into her nutrition plan and we discussed resumption of daily apple.    Vitals Temp: 97.8 F (36.6 C) BP: 124/87 Pulse Rate: 88 SpO2:  98 %   Anthropometric Measurements Height: 5\' 4"  (1.626 m) Weight: 177 lb (80.3 kg) BMI (Calculated): 30.37 Weight at Last Visit: 180 lb Weight Lost Since Last Visit: 3 lb Weight Gained Since Last Visit: 0 Starting Weight: 192 lb Total Weight Loss (lbs): 15 lb (6.804 kg) Peak Weight: 202 lb Waist Measurement : 43 inches   Body Composition  Body Fat %: 40 % Fat Mass (lbs): 71 lbs Muscle Mass (lbs): 101 lbs Total Body Water (lbs): 64.6 lbs Visceral Fat Rating : 11   Other Clinical Data Fasting: no Labs: no Today's Visit #: 8 Starting Date: 03/13/23     ASSESSMENT AND PLAN:  Diet: Shamoni is currently in the action stage of change. As such, her goal is to continue with weight loss efforts. She has agreed to Category 2 Plan.  Exercise: Jahna has been instructed to continue exercising as is for weight loss and overall health benefits.   Behavior Modification:  We discussed the following Behavioral Modification Strategies today: increasing lean protein intake, decreasing simple carbohydrates, increasing vegetables, increase H2O intake, increase high fiber foods, and planning for success. We discussed various medication options to help Lenabelle with her weight loss efforts and we both agreed to continue to work on nutritional and behavioral strategies to promote weight loss.    Return in about 2 weeks (around 07/03/2023).Marland Kitchen She was informed of the importance of frequent follow up visits to maximize her success with intensive lifestyle modifications for her  multiple health conditions.  Attestation Statements:   Reviewed by clinician on day of visit: allergies, medications, problem list, medical history, surgical history, family history, social history, and previous encounter notes.   Time spent on visit including pre-visit chart review and post-visit care and charting was 45 minutes.    Claudia Mannings, PA-C

## 2023-06-25 ENCOUNTER — Other Ambulatory Visit: Payer: Self-pay | Admitting: Internal Medicine

## 2023-06-25 ENCOUNTER — Ambulatory Visit
Admission: RE | Admit: 2023-06-25 | Discharge: 2023-06-25 | Disposition: A | Discharge status: Home / Self Care | Payer: Medicare Other | Source: Ambulatory Visit | Attending: Internal Medicine | Admitting: Internal Medicine

## 2023-06-25 DIAGNOSIS — Z1231 Encounter for screening mammogram for malignant neoplasm of breast: Secondary | ICD-10-CM

## 2023-06-27 ENCOUNTER — Encounter: Payer: Self-pay | Admitting: Neurology

## 2023-06-27 DIAGNOSIS — G4733 Obstructive sleep apnea (adult) (pediatric): Secondary | ICD-10-CM

## 2023-06-30 NOTE — Telephone Encounter (Signed)
New, [name], Otilio Jefferson, RN; Alain Honey; Darcel Smalling; 1 other Received, thank you!

## 2023-06-30 NOTE — Telephone Encounter (Signed)
Supply order sent to [facility].

## 2023-07-03 ENCOUNTER — Ambulatory Visit (INDEPENDENT_AMBULATORY_CARE_PROVIDER_SITE_OTHER): Payer: Medicare Other | Admitting: Internal Medicine

## 2023-07-03 ENCOUNTER — Encounter (INDEPENDENT_AMBULATORY_CARE_PROVIDER_SITE_OTHER): Payer: Self-pay | Admitting: Internal Medicine

## 2023-07-03 VITALS — BP 110/76 | HR 93 | Temp 98.0°F | Ht 64.0 in | Wt 178.0 lb

## 2023-07-03 DIAGNOSIS — E559 Vitamin D deficiency, unspecified: Secondary | ICD-10-CM

## 2023-07-03 DIAGNOSIS — K760 Fatty (change of) liver, not elsewhere classified: Secondary | ICD-10-CM | POA: Diagnosis not present

## 2023-07-03 DIAGNOSIS — E66811 Obesity, class 1: Secondary | ICD-10-CM

## 2023-07-03 DIAGNOSIS — Z6830 Body mass index (BMI) 30.0-30.9, adult: Secondary | ICD-10-CM | POA: Diagnosis not present

## 2023-07-03 MED ORDER — VITAMIN D (ERGOCALCIFEROL) 1.25 MG (50000 UNIT) PO CAPS: 50000.0000 [IU] | ORAL_CAPSULE | ORAL | 0 refills | Status: DC

## 2023-07-03 NOTE — Progress Notes (Signed)
Office: [phone]  /  Fax: [phone]  Weight Summary And Biometrics  Vitals Temp: 98 F (36.7 C) BP: 110/76 Pulse Rate: 93 SpO2: 96 %   Anthropometric Measurements Height: 5\' 4"  (1.626 m) Weight: 178 lb (80.7 kg) BMI (Calculated): 30.54 Weight at Last Visit: 177 lb Weight Lost Since Last Visit: 0 lb Weight Gained Since Last Visit: 1 lb Starting Weight: 192 lb Total Weight Loss (lbs): 14 lb (6.35 kg) Peak Weight: 202 lb   Body Composition  Body Fat %: 40.3 % Fat Mass (lbs): 71.8 lbs Muscle Mass (lbs): 100.8 lbs Total Body Water (lbs): 65.6 lbs Visceral Fat Rating : 11    No data recorded Today's Visit #: 9  Starting Date: 03/13/23   Subjective   Chief Complaint: Obesity  Charle is here to discuss her progress with her obesity treatment plan. She is on the the Category 2 Plan and states she is following her eating plan approximately 75 % of the time. She states she is exercising 60 minutes 3 times per week.  Interval History:   Discussed the use of AI scribe software for clinical note transcription with the patient, who gave verbal consent to proceed.  History of Present Illness   Patient presents today for weight management.  She reported feeling overwhelmed by the dietary plan and has been trying to incorporate more home cooking into her routine. She has been using the "[employer]" website for recipe ideas but expressed confusion about modifying recipes to fit her dietary needs.  The patient has lost 20 pounds since February, but expressed concern about a recent measurement that showed an increase in waist circumference. She also voiced concerns about her muscle mass, which has slightly decreased. Despite these concerns, the patient reported noticeable changes in how her clothes fit, indicating changes in body composition.  The patient has been trying to incorporate more physical activity into her routine, including using doing water aerobics. However,  she expressed uncertainty about the effectiveness of this exercise in building muscle mass.  The patient has a history of insulin resistance and fatty liver, which was identified in a 2018 scan. She expressed a desire to monitor these conditions closely and requested a repeat blood test, which was last done in August.  The patient has been trying to adhere to a diet high in protein and low in saturated fats and simple sugars. She expressed difficulty in finding a suitable protein shake due to concerns about artificial sweeteners and potential gastrointestinal effects. She also expressed a desire to understand more about the glycemic index of foods and how to incorporate this knowledge into her meal planning.  The patient has been making efforts to increase her intake of fruits and vegetables and reduce her intake of carbohydrates. She has been experimenting with cooking chicken and incorporating it into salads. She expressed interest in learning more about meal replacements and protein supplements but has concerns about artificial sweeteners.  In summary, the patient is actively engaged in her weight management program but is experiencing some challenges in adhering to dietary recommendations and understanding her progress. She has a history of insulin resistance and fatty liver and is keen to monitor these conditions closely. She is open to exploring new strategies to improve her diet and increase her physical activity.       Pharmacotherapy for weight loss: She is currently taking patient has declined pharmacotherapy in past.   Assessment and Plan   Treatment Plan For Obesity:  Recommended Dietary Goals  Milianna is currently in the action stage of change. As such, her goal is to continue weight management plan. She has agreed to: continue current plan  Behavioral Intervention  We discussed the following Behavioral Modification Strategies today: continue to work on maintaining a reduced  calorie state, getting the recommended amount of protein, incorporating whole foods, making healthy choices, staying well hydrated and practicing mindfulness when eating..  Additional resources provided today: None  Recommended Physical Activity Goals  Bee has been advised to work up to 150 minutes of moderate intensity aerobic activity a week and strengthening exercises 2-3 times per week for cardiovascular health, weight loss maintenance and preservation of muscle mass.   She has agreed to :  Think about enjoyable ways to increase daily physical activity and overcoming barriers to exercise, Increase physical activity in their day and reduce sedentary time (increase NEAT)., and Start strengthening exercises with a goal of 2-3 sessions a week   Pharmacotherapy  We discussed various medication options to help Linze with her weight loss efforts and we both agreed to : continue with nutritional and behavioral strategies and has declined pharmacotherapy  Associated Conditions Addressed Today  Metabolic dysfunction-associated steatotic liver disease (MASLD)  Vitamin D deficiency -     Vitamin D (Ergocalciferol); Take 1 capsule (50,000 Units total) by mouth every 7 (seven) days.  Dispense: 4 capsule; Refill: 0  Class 1 obesity with serious comorbidity and body mass index (BMI) of 30.0 to 30.9 in adult, unspecified obesity type    Assessment and Plan    Obesity For weight management, we discussed modifying recipes to fit a low-carb, high-protein diet, targeting 1200-1500 calories per day. We emphasized the benefits of high-protein meals, protein shakes, and meal replacements while addressing concerns about artificial sweeteners by recommending plant-based protein powders and shakes without artificial sweeteners. We explored prepackaged food services for convenience and advised using the [facility] [employer] website with filters for high-protein, low-carb recipes, incorporating high-protein meals and  snacks, considering plant-based protein options, and exploring prepackaged food services like [employer].We noted a slight decrease in muscle mass and discussed the importance of strength training exercises to maintain muscle mass, including bands, Pilates, yoga, dumbbell exercises, and water rowing with resistance. The plan includes incorporating strength training exercises and considering water rowing with resistance.  Fatty Liver Disease We noted diffuse fatty infiltration of the liver on a 2018 scan and discussed the relationship between fatty liver, obesity, and insulin resistance. We emphasized that weight loss, reducing saturated fats, and avoiding simple sugars can improve fatty liver. The plan includes continuing weight loss efforts and reducing saturated fats and simple sugars.  She may also benefit from GLP-1 therapy which at present time she declines.  Insulin Resistance We discussed concerns about insulin resistance and glucose levels, emphasizing the importance of monitoring insulin levels and the benefits of metformin for insulin resistance. We explained that metformin can lower insulin levels, prevent diabetes, improve cholesterol, and gut bacteria.  The plan is to check fasting insulin levels today and consider starting metformin if insulin resistance persists.   General Health Maintenance We discussed the importance of balanced meals, incorporating fruits and vegetables, and choosing whole grains. We provided guidance on meal planning and preparation, including the use of protein bowls and low-calorie dressings, and emphasized the benefits of whole grains like brown rice, quinoa, and whole wheat pasta. Sweet potatoes were mentioned as a good option in moderation. The plan includes incorporating more fruits and vegetables into meals, choosing whole grains,  using low-calorie dressings for protein bowls, and considering protein waffles and pancakes for breakfast options.  Follow-up We will  follow up with Shawn in four weeks and schedule the next appointment with the same provider for continuity of care.       Objective   Physical Exam:  Blood pressure 110/76, pulse 93, temperature 98 F (36.7 C), height 5\' 4"  (1.626 m), weight 178 lb (80.7 kg), SpO2 96%. Body mass index is 30.55 kg/m.  General: She is overweight, cooperative, alert, well developed, and in no acute distress. PSYCH: Has normal mood, affect and thought process.   HEENT: EOMI, sclerae are anicteric. Lungs: Normal breathing effort, no conversational dyspnea. Extremities: No edema.  Neurologic: No gross sensory or motor deficits. No tremors or fasciculations noted.    Diagnostic Data Reviewed:  BMET    Component Value Date/Time   NA 141 03/13/2023 1109   K 4.5 03/13/2023 1109   CL 104 03/13/2023 1109   CO2 21 03/13/2023 1109   GLUCOSE 88 03/13/2023 1109   GLUCOSE 152 (H) 01/02/2023 1200   BUN 17 03/13/2023 1109   CREATININE 0.98 03/13/2023 1109   CREATININE 0.84 12/27/2015 0833   CALCIUM 10.0 03/13/2023 1109   GFRNONAA >60 01/02/2023 1200   GFRAA 38 (L) 09/29/2015 1553   Lab Results  Component Value Date   HGBA1C 5.3 03/13/2023   Lab Results  Component Value Date   INSULIN 34.0 (H) 03/13/2023   Lab Results  Component Value Date   TSH 0.901 03/13/2023   CBC    Component Value Date/Time   WBC 6.8 03/13/2023 1109   WBC 8.6 01/02/2023 1200   RBC 5.34 (H) 03/13/2023 1109   RBC 5.63 (H) 01/02/2023 1200   HGB 14.6 03/13/2023 1109   HCT 46.9 (H) 03/13/2023 1109   PLT 219 03/13/2023 1109   MCV 88 03/13/2023 1109   MCH 27.3 03/13/2023 1109   MCH 27.2 01/02/2023 1200   MCHC 31.1 (L) 03/13/2023 1109   MCHC 30.9 01/02/2023 1200   RDW 13.3 03/13/2023 1109   Iron Studies    Component Value Date/Time   IRON 77 03/13/2023 1109   TIBC 343 03/13/2023 1109   FERRITIN 59 03/13/2023 1109   IRONPCTSAT 22 03/13/2023 1109   Lipid Panel     Component Value Date/Time   CHOL 144 03/13/2023  1109   TRIG 105 03/13/2023 1109   HDL 52 03/13/2023 1109   CHOLHDL 2.8 03/13/2023 1109   LDLCALC 73 03/13/2023 1109   Hepatic Function Panel     Component Value Date/Time   PROT 7.4 03/13/2023 1109   ALBUMIN 4.6 03/13/2023 1109   AST 24 03/13/2023 1109   ALT 20 03/13/2023 1109   ALKPHOS 52 03/13/2023 1109   BILITOT 0.6 03/13/2023 1109   BILIDIR 0.15 11/14/2021 0912      Component Value Date/Time   TSH 0.901 03/13/2023 1109   TSH 2.070 10/30/2020 1417   Nutritional Lab Results  Component Value Date   VD25OH 28.6 (L) 03/13/2023    Follow-Up   Return in about 4 weeks (around 07/31/2023) for For Weight Mangement with Dr. Rikki Spearing.Marland Kitchen She was informed of the importance of frequent follow up visits to maximize her success with intensive lifestyle modifications for her multiple health conditions.  Attestation Statement   Reviewed by clinician on day of visit: allergies, medications, problem list, medical history, surgical history, family history, social history, and previous encounter notes.     Worthy Rancher, MD

## 2023-07-19 ENCOUNTER — Other Ambulatory Visit: Payer: Self-pay | Admitting: Cardiology

## 2023-07-19 DIAGNOSIS — R0609 Other forms of dyspnea: Secondary | ICD-10-CM

## 2023-07-19 DIAGNOSIS — I10 Essential (primary) hypertension: Secondary | ICD-10-CM

## 2023-07-29 ENCOUNTER — Encounter (INDEPENDENT_AMBULATORY_CARE_PROVIDER_SITE_OTHER): Payer: Self-pay | Admitting: Physician Assistant

## 2023-07-29 ENCOUNTER — Ambulatory Visit (INDEPENDENT_AMBULATORY_CARE_PROVIDER_SITE_OTHER): Payer: Medicare Other | Admitting: Physician Assistant

## 2023-07-29 VITALS — BP 126/83 | HR 87 | Temp 97.6°F | Ht 64.0 in | Wt 179.0 lb

## 2023-07-29 DIAGNOSIS — E669 Obesity, unspecified: Secondary | ICD-10-CM

## 2023-07-29 DIAGNOSIS — E559 Vitamin D deficiency, unspecified: Secondary | ICD-10-CM | POA: Diagnosis not present

## 2023-07-29 DIAGNOSIS — K760 Fatty (change of) liver, not elsewhere classified: Secondary | ICD-10-CM

## 2023-07-29 DIAGNOSIS — Z6830 Body mass index (BMI) 30.0-30.9, adult: Secondary | ICD-10-CM

## 2023-07-29 DIAGNOSIS — E88819 Insulin resistance, unspecified: Secondary | ICD-10-CM

## 2023-07-29 MED ORDER — VITAMIN D (ERGOCALCIFEROL) 1.25 MG (50000 UNIT) PO CAPS: 50000.0000 [IU] | ORAL_CAPSULE | ORAL | 0 refills | Status: DC

## 2023-07-29 NOTE — Progress Notes (Signed)
 SUBJECTIVE:  Chief Complaint: Obesity Discussed the use of AI scribe software for clinical note transcription with the patient, who gave verbal consent to proceed.  History of Present Illness     Interim History: She is up 1 lb since her last visit.  Down 13 lbs overall- Start date 03/13/23 TBW loss of 6.8%  [name], a 67 year old patient with a history of insulin  resistance, metabolic dysfunction associated steatotic liver disease, and vitamin D  deficiency, presents for a follow-up visit regarding her obesity treatment plan. The patient reports a recent weight gain, despite daily weight monitoring and adherence to a high-protein diet. The patient's weight has been fluctuating around the 180-pound mark since November, with no significant gain over the holiday period.  The patient denies feeling excessively hungry or restricted in her intake. However, she reports a persistent craving for cashews, consuming approximately 30 cashews daily. The patient acknowledges this as a potential issue and expresses a willingness to limit her intake. She also mentions a recent increase in pickle consumption, specifically garlic pickles, which she suspects may be contributing to reported instances of bad breath.  We discussed limiting nuts to 1/4 cup daily and using a measuring cup to be consistent with keeping in guideline.   Regarding her meal plan, the patient has been consistent with a high-protein cereal breakfast, a lunch of keto tortillas with sauce, pesto, mozzarella cheese, and mushrooms, and a dinner of either ground beef with mozzarella cheese or bacon on a salad with balsamic vinaigrette, extra virgin oil, and store-bought salsa. Occasionally, she also consumes chicken cooked in mozzarella cheese. The patient acknowledges a preference for cashews over other snack options like almonds, despite recognizing the higher fat content in cashews.  In terms of physical activity, the patient engages in  water aerobics three times a week and line dancing once a week. She expresses a desire for variation in her exercise routine, feeling that the water aerobics exercises have become repetitive. The patient owns weights and resistance bands but admits to not utilizing them regularly. She expresses interest in incorporating strength training into her routine.  [name] is here to discuss her progress with her obesity treatment plan. She is on the Category 2 Plan and states she is following her eating plan approximately 75 % of the time. She states she is exercising water aerobics 60 minutes 2-3 times per week.   OBJECTIVE: Visit Diagnoses: Problem List Items Addressed This Visit     Generalized obesity   BMI 30.0-30.9,adult   Vitamin D  deficiency   Relevant Medications   Vitamin D , Ergocalciferol , (DRISDOL ) 1.25 MG (50000 UNIT) CAPS capsule   Insulin  resistance - Primary   Metabolic dysfunction-associated steatotic liver disease (MASLD)  Obesity Follow-up for obesity management. Weight fluctuating, currently ~180 lbs since November. Adhering to high-protein, low-carb diet but may be consuming excess calories from certain foods. Engaging in regular physical activity, including water aerobics and line dancing. Prefers nutritional management over medication. - Limit cashew intake to a quarter cup per serving using a plastic scooper - Consider substituting cashews with almonds or other nuts, also limited to a quarter cup - Incorporate [employer] prepared meats into meals, ensuring portion control - Pair meals with riced cauliflower or other low-carb grains like quinoa, limited to half a cup - Consider journaling daily food intake, including calories, water, protein, and exercise and provided a log to bring in to next visit.  - Avoid bringing cashews into the house to prevent over consumption of calorie dense  food - Increase strength training to at least two times per week, using weights or resistance  bands - Consider using ankle weights during daily activities for passive strength training  Insulin  Resistance Insulin  resistance managed with low-carb diet and regular physical activity. Prefers nutritional management over medication. Continue working on nutrition plan to decrease simple carbohydrates, increase lean proteins and exercise to promote weight loss, improve glycemic control and prevent progression to Type 2 diabetes.  - Monitor carbohydrate intake, aiming for 1200-1500 calories per day and 85 grams of protein per day.  - Incorporate high-fiber foods to improve gut health and satiety  Metabolic Dysfunction Associated Steatotic Liver Disease Metabolic dysfunction associated steatotic liver disease managed with weight and dietary control. Prefers nutritional management over medication. - Continue current dietary plan with emphasis on low-fat, lean high-protein foods to promote weight loss  Counseling: NAFLD is an umbrella term that encompasses a disease spectrum that includes steatosis (fat) without inflammation, steatohepatitis (NASH; fat + inflammation in a characteristic pattern), and cirrhosis. Bland steatosis is felt to be a benign condition, with extremely low to no risk of progression to cirrhosis, whereas NASH can progress to cirrhosis. The mainstay of treatment of NAFLD includes lifestyle modification to achieve weight loss, at least 7% of current body weight. Which she is nearing.  Low carbohydrate diets can be beneficial in improving NAFLD liver histology. Additionally, exercise, even the absence of weight loss can have beneficial effects on the patient's metabolic profile and liver health. We recommend that their metabolic comorbidities be aggressively managed, as patients with NAFLD are at increased risk of coronary artery disease.  Vitamin D  Deficiency Vitamin D  is not at goal of 50.  Most recent vitamin D  level was 28.6. She is on  prescription ergocalciferol  50,000 IU  weekly. No N/V or muscle weakness with Ergocalciferol . Lab Results  Component Value Date   VD25OH 28.6 (L) 03/13/2023    Plan: Continue and refill  prescription ergocalciferol  50,000 IU weekly Low vitamin D  levels can be associated with adiposity and may result in leptin resistance and weight gain. Also associated with fatigue. Currently on vitamin D  supplementation without any adverse effects.    General Health Maintenance Engaged in physical activities and dietary management. Advised to continue these practices and make adjustments as needed. - Continue water aerobics and line dancing - Incorporate strength training exercises at least two times per week - Consider using olive oil spray instead of liquid oil to reduce calorie intake - Monitor and adjust dietary intake based on journaling results  Follow-up - Follow-up with Dr. Francyne every 2-3 weeks - Discuss any concerns or need for medication with Dr. Francyne during follow-up visits.  Vitals Temp: 97.6 F (36.4 C) BP: 126/83 Pulse Rate: 87 SpO2: 99 %   Anthropometric Measurements Height: 5' 4 (1.626 m) Weight: 179 lb (81.2 kg) BMI (Calculated): 30.71 Weight at Last Visit: 178lb Weight Lost Since Last Visit: 0 Weight Gained Since Last Visit: 1lb Starting Weight: 192lb Total Weight Loss (lbs): 13 lb (5.897 kg) Peak Weight: 202lb   Body Composition  Body Fat %: 40.7 % Fat Mass (lbs): 72.8 lbs Muscle Mass (lbs): 100.8 lbs Total Body Water (lbs): 66.4 lbs Visceral Fat Rating : 11   Other Clinical Data Fasting: no Labs: no Today's Visit #: 10 Starting Date: 03/13/23     ASSESSMENT AND PLAN:  Diet: [name] is currently in the action stage of change. As such, her goal is to continue with weight loss efforts. She has agreed  to Category 2 Plan and keeping a food journal and adhering to recommended goals of 1200-1500 calories and 85 protein.  Exercise: [name] has been instructed to work up to a goal of 150  minutes of combined cardio and strengthening exercise per week for weight loss and overall health benefits.   Behavior Modification:  We discussed the following Behavioral Modification Strategies today: increasing lean protein intake, decreasing simple carbohydrates, increasing vegetables, increase H2O intake, increase high fiber foods, meal planning and cooking strategies, better snacking choices, planning for success, and keep a strict food journal. We discussed various medication options to help [name] with her weight loss efforts and we both agreed to continue to work on nutritional and behavioral strategies to promote weight loss.    Return in about 2 weeks (around 08/12/2023).[name] She was informed of the importance of frequent follow up visits to maximize her success with intensive lifestyle modifications for her multiple health conditions.  Attestation Statements:   Reviewed by clinician on day of visit: allergies, medications, problem list, medical history, surgical history, family history, social history, and previous encounter notes.   Time spent on visit including pre-visit chart review and post-visit care and charting was 55 minutes.    Claudia Raisanen, PA-C

## 2023-08-07 ENCOUNTER — Encounter (INDEPENDENT_AMBULATORY_CARE_PROVIDER_SITE_OTHER): Payer: Self-pay | Admitting: Internal Medicine

## 2023-08-07 ENCOUNTER — Ambulatory Visit (INDEPENDENT_AMBULATORY_CARE_PROVIDER_SITE_OTHER): Payer: Medicare Other | Admitting: Internal Medicine

## 2023-08-07 VITALS — BP 125/84 | HR 85 | Temp 98.0°F | Ht 64.0 in | Wt 179.0 lb

## 2023-08-07 DIAGNOSIS — E559 Vitamin D deficiency, unspecified: Secondary | ICD-10-CM | POA: Diagnosis not present

## 2023-08-07 DIAGNOSIS — E88819 Insulin resistance, unspecified: Secondary | ICD-10-CM

## 2023-08-07 DIAGNOSIS — E782 Mixed hyperlipidemia: Secondary | ICD-10-CM

## 2023-08-07 DIAGNOSIS — K760 Fatty (change of) liver, not elsewhere classified: Secondary | ICD-10-CM

## 2023-08-07 DIAGNOSIS — M109 Gout, unspecified: Secondary | ICD-10-CM | POA: Diagnosis not present

## 2023-08-07 DIAGNOSIS — E66811 Obesity, class 1: Secondary | ICD-10-CM

## 2023-08-07 DIAGNOSIS — Z6830 Body mass index (BMI) 30.0-30.9, adult: Secondary | ICD-10-CM

## 2023-08-07 MED ORDER — VITAMIN D (ERGOCALCIFEROL) 1.25 MG (50000 UNIT) PO CAPS: 50000.0000 [IU] | ORAL_CAPSULE | ORAL | 0 refills | Status: DC

## 2023-08-07 NOTE — Progress Notes (Signed)
 Office: [phone]  /  Fax: [phone]  Weight Summary And Biometrics  Vitals Temp: 98 F (36.7 C) BP: 125/84 Pulse Rate: 85 SpO2: 98 %   Anthropometric Measurements Height: 5' 4 (1.626 m) Weight: 179 lb (81.2 kg) BMI (Calculated): 30.71 Weight at Last Visit: 179 lb Weight Lost Since Last Visit: 0 lb Weight Gained Since Last Visit: 0 lb Starting Weight: 192 lb Total Weight Loss (lbs): 13 lb (5.897 kg) Peak Weight: 202 lb   Body Composition  Body Fat %: 40.9 % Fat Mass (lbs): 73.2 lbs Muscle Mass (lbs): 100.4 lbs Total Body Water (lbs): 67 lbs Visceral Fat Rating : 11    No data recorded Today's Visit #: 11  Starting Date: 03/13/23   Subjective   Chief Complaint: Obesity  Zenora is here to discuss her progress with her obesity treatment plan. She is on the the Category 2 Plan and states she is following her eating plan approximately 80 % of the time. She states she is exercising 60 minutes 2 times per week.  Interval History:   Discussed the use of AI scribe software for clinical note transcription with the patient, who gave verbal consent to proceed.  History of Present Illness   Presents for medical weight management.  She feels like she has hit a plateau in her weight loss journey, despite significant dietary changes, including the elimination of pasta and pizza from her diet. The patient also reported a recent episode of gout, which she attributed to the consumption of roast beef from a fast-food chain.  The patient has been adhering to a low-carb, high-protein diet and has made efforts to reduce her intake of saturated fats. She acknowledges struggling with portion control, particularly with cashews, which she consumes in large quantities.  In terms of physical activity, the patient reported a decrease during the holiday season, with only two hours of exercise per week. She expressed an interest in incorporating more strength training into her  routine, including at-home workouts with weights.  The patient's blood work from August of the previous year revealed low vitamin D  levels and high insulin  levels, indicating insulin  resistance. She expressed concern about these results and a desire to improve her health. She has been taking B12 supplements on the advice of another healthcare provider, although her B12 levels were reported to be within the normal range.  The patient has been managing her coronary artery disease and aneurysm without any recent complications. She denied having a heart attack but reported a history of a racing heartbeat, for which she underwent ablation. She also reported mild sleep apnea, which she believes has not significantly impacted her weight management.  In summary, the patient is actively engaged in her weight management but has hit a plateau despite dietary changes and regular exercise. She has a history of coronary artery disease, aneurysm, and mild sleep apnea, and recent blood work has revealed insulin  resistance and low vitamin D  levels. The patient is motivated to improve her health and is open to further interventions to aid her weight loss journey.       Orexigenic Control:  Reports problems with appetite and hunger signals.  Denies problems with satiety and satiation.  Denies problems with eating patterns and portion control.  Denies abnormal cravings. Denies feeling deprived or restricted.   Barriers identified: none.   Pharmacotherapy for weight loss: She is currently taking no anti-obesity medication and patient has declined pharmacotherapy in past.   Assessment and Plan   Treatment  Plan For Obesity:  Recommended Dietary Goals  Tiearra is currently in the action stage of change. As such, her goal is to continue weight management plan. She has agreed to: continue current plan  Behavioral Intervention  We discussed the following Behavioral Modification Strategies today: increasing lean  protein intake to established goals, decreasing simple carbohydrates , practice mindfulness eating and understand the difference between hunger signals and cravings, avoiding temptations and identifying enticing environmental cues, and continue to work on maintaining a reduced calorie state, getting the recommended amount of protein, incorporating whole foods, making healthy choices, staying well hydrated and practicing mindfulness when eating..  Additional resources provided today: None  Recommended Physical Activity Goals  Juvia has been advised to work up to 150 minutes of moderate intensity aerobic activity a week and strengthening exercises 2-3 times per week for cardiovascular health, weight loss maintenance and preservation of muscle mass.   She has agreed to :  continue to gradually increase the amount and intensity of exercise routine  Pharmacotherapy  We discussed various medication options to help Kristian with her weight loss efforts and we both agreed to : continue with nutritional and behavioral strategies and has declined pharmacotherapy  Associated Conditions Addressed and Impacted by Obesity Treatment  Metabolic dysfunction-associated steatotic liver disease (MASLD) -     CMP14+EGFR -     CBC with Differential/Platelet  Vitamin D  deficiency -     Vitamin D  (Ergocalciferol ); Take 1 capsule (50,000 Units total) by mouth every 7 (seven) days.  Dispense: 4 capsule; Refill: 0 -     VITAMIN D  25 Hydroxy (Vit-D Deficiency, Fractures)  Mixed hyperlipidemia -     Lipid Panel With LDL/HDL Ratio  Gout involving toe, unspecified cause, unspecified chronicity, unspecified laterality -     Uric acid  Insulin  resistance -     Insulin , random  Class 1 obesity with serious comorbidity and body mass index (BMI) of 30.0 to 30.9 in adult, unspecified obesity type Assessment & Plan: Patient presents for medical weight management with significant insulin  resistance (insulin  level 34). She  has difficulty with weight loss and and feels that she has hit a plateau.  I reviewed her weight loss trend and she has actually had a steady decrease in weight but has a positive deflection after the holidays which is likely secondary to changes in nutritional patterns and physical activity not true metabolic slowing.  Discussed the role of saturated fats and carbohydrates in insulin  resistance and weight gain. Emphasized a low-carb, high-protein diet, physical activity, and tracking dietary intake to improve insulin  sensitivity and weight loss. Explained that a 500-calorie deficit per day is needed to lose one pound per week.   - Track dietary intake using My Net Diary app for 3-4 days a week   - Aim for 1200 calories per day with 90 grams of protein   - Reduce intake of saturated fats and added sugars   - Increase physical activity, including strength training exercises   - Order fasting blood sugar, insulin  levels, vitamin D , uric acid, CMP, CBC, and lipid panel   - Schedule follow-up appointment in 4 weeks            Objective   Physical Exam:  Blood pressure 125/84, pulse 85, temperature 98 F (36.7 C), height 5' 4 (1.626 m), weight 179 lb (81.2 kg), SpO2 98%. Body mass index is 30.73 kg/m.  General: She is overweight, cooperative, alert, well developed, and in no acute distress. PSYCH: Has normal mood,  affect and thought process.   HEENT: EOMI, sclerae are anicteric. Lungs: Normal breathing effort, no conversational dyspnea. Extremities: No edema.  Neurologic: No gross sensory or motor deficits. No tremors or fasciculations noted.    Diagnostic Data Reviewed:  BMET    Component Value Date/Time   NA 141 03/13/2023 1109   K 4.5 03/13/2023 1109   CL 104 03/13/2023 1109   CO2 21 03/13/2023 1109   GLUCOSE 88 03/13/2023 1109   GLUCOSE 152 (H) 01/02/2023 1200   BUN 17 03/13/2023 1109   CREATININE 0.98 03/13/2023 1109   CREATININE 0.84 12/27/2015 0833   CALCIUM  10.0  03/13/2023 1109   GFRNONAA >60 01/02/2023 1200   GFRAA 38 (L) 09/29/2015 1553   Lab Results  Component Value Date   HGBA1C 5.3 03/13/2023   Lab Results  Component Value Date   INSULIN  34.0 (H) 03/13/2023   Lab Results  Component Value Date   TSH 0.901 03/13/2023   CBC    Component Value Date/Time   WBC 6.8 03/13/2023 1109   WBC 8.6 01/02/2023 1200   RBC 5.34 (H) 03/13/2023 1109   RBC 5.63 (H) 01/02/2023 1200   HGB 14.6 03/13/2023 1109   HCT 46.9 (H) 03/13/2023 1109   PLT 219 03/13/2023 1109   MCV 88 03/13/2023 1109   MCH 27.3 03/13/2023 1109   MCH 27.2 01/02/2023 1200   MCHC 31.1 (L) 03/13/2023 1109   MCHC 30.9 01/02/2023 1200   RDW 13.3 03/13/2023 1109   Iron Studies    Component Value Date/Time   IRON 77 03/13/2023 1109   TIBC 343 03/13/2023 1109   FERRITIN 59 03/13/2023 1109   IRONPCTSAT 22 03/13/2023 1109   Lipid Panel     Component Value Date/Time   CHOL 144 03/13/2023 1109   TRIG 105 03/13/2023 1109   HDL 52 03/13/2023 1109   CHOLHDL 2.8 03/13/2023 1109   LDLCALC 73 03/13/2023 1109   Hepatic Function Panel     Component Value Date/Time   PROT 7.4 03/13/2023 1109   ALBUMIN 4.6 03/13/2023 1109   AST 24 03/13/2023 1109   ALT 20 03/13/2023 1109   ALKPHOS 52 03/13/2023 1109   BILITOT 0.6 03/13/2023 1109   BILIDIR 0.15 11/14/2021 0912      Component Value Date/Time   TSH 0.901 03/13/2023 1109   TSH 2.070 10/30/2020 1417   Nutritional Lab Results  Component Value Date   VD25OH 28.6 (L) 03/13/2023    Follow-Up   Return in about 4 weeks (around 09/04/2023) for For Weight Mangement with Dr. [email or web address] She was informed of the importance of frequent follow up visits to maximize her success with intensive lifestyle modifications for her multiple health conditions.  Attestation Statement   Reviewed by clinician on day of visit: allergies, medications, problem list, medical history, surgical history, family history, social history, and previous  encounter notes.     Lucas Francyne, MD

## 2023-08-08 NOTE — Assessment & Plan Note (Signed)
 Patient presents for medical weight management with significant insulin  resistance (insulin  level 34). She has difficulty with weight loss and and feels that she has hit a plateau.  I reviewed her weight loss trend and she has actually had a steady decrease in weight but has a positive deflection after the holidays which is likely secondary to changes in nutritional patterns and physical activity not true metabolic slowing.  Discussed the role of saturated fats and carbohydrates in insulin  resistance and weight gain. Emphasized a low-carb, high-protein diet, physical activity, and tracking dietary intake to improve insulin  sensitivity and weight loss. Explained that a 500-calorie deficit per day is needed to lose one pound per week.   - Track dietary intake using My Net Diary app for 3-4 days a week   - Aim for 1200 calories per day with 90 grams of protein   - Reduce intake of saturated fats and added sugars   - Increase physical activity, including strength training exercises   - Order fasting blood sugar, insulin  levels, vitamin D , uric acid, CMP, CBC, and lipid panel   - Schedule follow-up appointment in 4 weeks

## 2023-08-20 ENCOUNTER — Ambulatory Visit (INDEPENDENT_AMBULATORY_CARE_PROVIDER_SITE_OTHER): Payer: Medicare Other | Admitting: Physician Assistant

## 2023-08-21 ENCOUNTER — Encounter (INDEPENDENT_AMBULATORY_CARE_PROVIDER_SITE_OTHER): Payer: Self-pay | Admitting: Internal Medicine

## 2023-08-21 LAB — CMP14+EGFR
ALT: 20 [IU]/L (ref 0–32)
AST: 18 [IU]/L (ref 0–40)
Albumin: 4.6 g/dL (ref 3.9–4.9)
Alkaline Phosphatase: 49 [IU]/L (ref 44–121)
BUN/Creatinine Ratio: 29 — ABNORMAL HIGH (ref 12–28)
BUN: 24 mg/dL (ref 8–27)
Bilirubin Total: 0.4 mg/dL (ref 0.0–1.2)
CO2: 21 mmol/L (ref 20–29)
Calcium: 9.9 mg/dL (ref 8.7–10.3)
Chloride: 103 mmol/L (ref 96–106)
Creatinine, Ser: 0.84 mg/dL (ref 0.57–1.00)
Globulin, Total: 2.6 g/dL (ref 1.5–4.5)
Glucose: 86 mg/dL (ref 70–99)
Potassium: 5.1 mmol/L (ref 3.5–5.2)
Sodium: 141 mmol/L (ref 134–144)
Total Protein: 7.2 g/dL (ref 6.0–8.5)
eGFR: 77 mL/min/{1.73_m2} (ref >59)

## 2023-08-21 LAB — LIPID PANEL WITH LDL/HDL RATIO
Cholesterol, Total: 129 mg/dL (ref 100–199)
HDL: 56 mg/dL (ref >39)
LDL Chol Calc (NIH): 60 mg/dL (ref 0–99)
LDL/HDL Ratio: 1.1 {ratio} (ref 0.0–3.2)
Triglycerides: 62 mg/dL (ref 0–149)
VLDL Cholesterol Cal: 13 mg/dL (ref 5–40)

## 2023-08-21 LAB — CBC WITH DIFFERENTIAL/PLATELET
Basophils Absolute: 0.1 10*3/uL (ref 0.0–0.2)
Basos: 1 %
EOS (ABSOLUTE): 0.1 10*3/uL (ref 0.0–0.4)
Eos: 2 %
Hematocrit: 51.7 % — ABNORMAL HIGH (ref 34.0–46.6)
Hemoglobin: 16.1 g/dL — ABNORMAL HIGH (ref 11.1–15.9)
Immature Grans (Abs): 0 10*3/uL (ref 0.0–0.1)
Immature Granulocytes: 0 %
Lymphocytes Absolute: 1.7 10*3/uL (ref 0.7–3.1)
Lymphs: 23 %
MCH: 26.8 pg (ref 26.6–33.0)
MCHC: 31.1 g/dL — ABNORMAL LOW (ref 31.5–35.7)
MCV: 86 fL (ref 79–97)
Monocytes Absolute: 0.4 10*3/uL (ref 0.1–0.9)
Monocytes: 5 %
Neutrophils Absolute: 5.2 10*3/uL (ref 1.4–7.0)
Neutrophils: 69 %
Platelets: 237 10*3/uL (ref 150–450)
RBC: 6 x10E6/uL — ABNORMAL HIGH (ref 3.77–5.28)
RDW: 14.3 % (ref 11.7–15.4)
WBC: 7.5 10*3/uL (ref 3.4–10.8)

## 2023-08-21 LAB — URIC ACID: Uric Acid: 5.8 mg/dL (ref 3.0–7.2)

## 2023-08-21 LAB — INSULIN, RANDOM: INSULIN: 22.3 u[IU]/mL (ref 2.6–24.9)

## 2023-08-21 LAB — VITAMIN D 25 HYDROXY (VIT D DEFICIENCY, FRACTURES): Vit D, 25-Hydroxy: 69.1 ng/mL (ref 30.0–100.0)

## 2023-08-25 ENCOUNTER — Encounter: Payer: Self-pay | Admitting: Diagnostic Neuroimaging

## 2023-08-25 ENCOUNTER — Ambulatory Visit (INDEPENDENT_AMBULATORY_CARE_PROVIDER_SITE_OTHER): Payer: Medicare Other | Admitting: Diagnostic Neuroimaging

## 2023-08-25 VITALS — BP 149/66 | HR 56 | Ht 64.0 in | Wt 184.0 lb

## 2023-08-25 DIAGNOSIS — G610 Guillain-Barre syndrome: Secondary | ICD-10-CM | POA: Diagnosis not present

## 2023-08-25 NOTE — Patient Instructions (Signed)
  ACUTE NUMBNESS WEAKNESS (? GBS / AIDP; onset ~12/16/22; preceding URI ~12/12/22; RSV vaccine ~beginning April 2024) - s/p IVIG; doing much better - fall precautions reviewed; continue to stay active

## 2023-08-25 NOTE — Progress Notes (Signed)
[facility]  PATIENT: [name] DOB: [birth year]  REFERRING CLINICIAN: Chilton Greathouse, MD HISTORY FROM: patient  REASON FOR VISIT: follow up   HISTORICAL  CHIEF COMPLAINT:  Chief Complaint  Patient presents with   Results    Pt in 6  Pt here for numbness f/u Pt states tightness,numbness in both feet and toes     HISTORY OF PRESENT ILLNESS:   UPDATE (08/25/23, VRP): Since last visit, doing much better, with only mild numbness in the toes. Weakness, balance, hands are essentially back to normal.   PRIOR HPI (01/07/23, VRP): 68 year old female here for evaluation of rapidly progressive ascending numbness and weakness.  Symptoms started around Dec 16, 2022.  Patient noticed a numbness sensation on her left forefoot and toes.  This progressed to the right foot and up her legs.  Within a week she had numbness and tingling and weakness in her hands up to her elbows.  She went to the emergency room twice for evaluation.  She was diagnosed with neuropathy and was recommended to follow-up with neurology.  Of note patient had preceding upper respiratory infection symptoms Dec 12, 2022.  She also had RSV vaccination earlier in April.  Symptoms have plateaued since 01/04/23.  Using a walker at home.  She feels weakness in her hands, arms, legs.  No breathing issues.  No swallowing issues.  No prior similar symptoms.   REVIEW OF SYSTEMS: Full 14 system review of systems performed and negative with exception of: as per HPI.  ALLERGIES: Allergies  Allergen Reactions   Other Itching    Antiseptic Solution   Rosuvastatin Other (See Comments)    HOME MEDICATIONS: Outpatient Medications Prior to Visit  Medication Sig Dispense Refill   allopurinol (ZYLOPRIM) 100 MG tablet Take 100 mg by mouth daily.     aspirin EC 81 MG tablet Take 1 tablet (81 mg total) by mouth daily. SWALLOW WHOLE. 90 tablet 3   cetirizine (ZYRTEC) 10 MG tablet Take 10 mg by mouth in the  morning.     clobetasol (OLUX) 0.05 % topical foam Apply 1 Application topically 2 (two) times daily.     Colchicine (COLCRYS PO) Take 0.6 mg by mouth. Was twice daily, now once daily, then taper     ezetimibe (ZETIA) 10 MG tablet TAKE 1 TABLET BY MOUTH EVERY DAY 90 tablet 3   fluticasone (FLONASE) 50 MCG/ACT nasal spray Place 2 sprays into both nostrils daily as needed for allergies.     pravastatin (PRAVACHOL) 40 MG tablet TAKE 1 TABLET BY MOUTH EVERY DAY IN THE EVENING 90 tablet 3   spironolactone (ALDACTONE) 50 MG tablet TAKE 1 TABLET BY MOUTH EVERY DAY 90 tablet 1   venlafaxine XR (EFFEXOR-XR) 75 MG 24 hr capsule Take 75 mg by mouth daily.     Vitamin D, Ergocalciferol, (DRISDOL) 1.25 MG (50000 UNIT) CAPS capsule Take 1 capsule (50,000 Units total) by mouth every 7 (seven) days. 4 capsule 0   gabapentin (NEURONTIN) 100 MG capsule Take 1 capsule (100 mg total) by mouth 3 (three) times daily. 90 capsule 1   No facility-administered medications prior to visit.    PAST MEDICAL HISTORY: Past Medical History:  Diagnosis Date   ADD (attention deficit disorder)    Anxiety    Back pain    CHF (congestive heart failure) (HCC)    Constipation    Daytime somnolence 04/06/2013   DDD (degenerative disc disease), lumbar    Depression    Depression  GERD (gastroesophageal reflux disease)    Hypercholesteremia    Hypertension    IBS (irritable bowel syndrome)    Joint pain    OSA on CPAP 04/06/2013   Palpitations    Sleep apnea    Swelling of both lower extremities    Thyroid condition    Urinary incontinence     PAST SURGICAL HISTORY: Past Surgical History:  Procedure Laterality Date   ABLATION  09/29/2017   BACK SURGERY  04/06/2019   BREAST EXCISIONAL BIOPSY Left    1990s - benign   CATARACT EXTRACTION Bilateral    ELECTROPHYSIOLOGIC STUDY N/A 01/01/2016   Procedure: SVT Ablation;  Surgeon: Marinus Maw, MD;  Location: [facility] INVASIVE CV LAB;  Service: Cardiovascular;   Laterality: N/A;   HAND SURGERY Right    KNEE ARTHROPLASTY Left    NASAL SEPTUM SURGERY     RADICAL HYSTERECTOMY  2000   RIGHT HEART CATH N/A 05/22/2021   Procedure: RIGHT HEART CATH;  Surgeon: [place], Peter M, MD;  Location: [facility] INVASIVE CV LAB;  Service: Cardiovascular;  Laterality: N/A;   TONSILLECTOMY AND ADENOIDECTOMY      FAMILY HISTORY: Family History  Problem Relation Age of Onset   Heart disease Mother    Hypertension Mother    Diabetes Mother    Hyperlipidemia Mother    Heart disease Father    Hypertension Father    Stroke Father    Hyperlipidemia Father    Depression Father    Anxiety disorder Father    Diabetes Maternal Grandmother    Sleep apnea Neg Hx    BRCA 1/2 Neg Hx    Breast cancer Neg Hx    Neuropathy Neg Hx     SOCIAL HISTORY: Social History   Socioeconomic History   Marital status: Married    Spouse name: Not on file   Number of children: 2   Years of education: Not on file   Highest education level: Not on file  Occupational History   Not on file  Tobacco Use   Smoking status: Never   Smokeless tobacco: Never  Vaping Use   Vaping status: Never Used  Substance and Sexual Activity   Alcohol use: Not Currently    Alcohol/week: 0.0 standard drinks of alcohol   Drug use: No   Sexual activity: Yes    Partners: Male    Birth control/protection: Surgical    Comment: hysterectomy  Other Topics Concern   Not on file  Social History Narrative   Pt lives with husband    Retired    [employer] Strain: Not on file  Food Insecurity: Not on file  Transportation Needs: Not on file  Physical Activity: Not on file  Stress: Not on file  Social Connections: Unknown (08/24/2022)   Received from [facility]   Social Network    Social Network: Not on file  Intimate Partner Violence: Unknown (08/24/2022)   Received from [employer], [facility]   HITS    Physically Hurt: Not on file    Insult  or Talk Down To: Not on file    Threaten Physical Harm: Not on file    Scream or Curse: Not on file     PHYSICAL EXAM  GENERAL EXAM/CONSTITUTIONAL: Vitals:  Vitals:   08/25/23 1523  BP: (!) 149/66  Pulse: (!) 56  Weight: 184 lb (83.5 kg)  Height: 5\' 4"  (1.626 m)   Body mass index is 31.58 kg/m. Wt  Readings from Last 3 Encounters:  08/25/23 184 lb (83.5 kg)  08/07/23 179 lb (81.2 kg)  07/29/23 179 lb (81.2 kg)   Patient is in no distress; well developed, nourished and groomed; neck is supple  CARDIOVASCULAR: Examination of carotid arteries is normal; no carotid bruits Regular rate and rhythm, no murmurs Examination of peripheral vascular system by observation and palpation is normal  EYES: Ophthalmoscopic exam of optic discs and posterior segments is normal; no papilledema or hemorrhages No results found.  MUSCULOSKELETAL: Gait, strength, tone, movements noted in Neurologic exam below  NEUROLOGIC: MENTAL STATUS:      No data to display         awake, alert, oriented to person, place and time recent and remote memory intact normal attention and concentration language fluent, comprehension intact, naming intact fund of knowledge appropriate  CRANIAL NERVE:  2nd - no papilledema on fundoscopic exam 2nd, 3rd, 4th, 6th - pupils equal and reactive to light, visual fields full to confrontation, extraocular muscles intact, no nystagmus 5th - facial sensation symmetric 7th - facial strength symmetric 8th - hearing intact 9th - palate elevates symmetrically, uvula midline 11th - shoulder shrug symmetric 12th - tongue protrusion midline  MOTOR:  normal bulk and tone BUE 5/5 --> CONGENTIAL ABSENT LEFT 5TH DIGIT; RIGHT 5TH DIGIT DEFORMITY BLE 5/5   SENSORY:  normal and symmetric to light touch, temperature, vibration; except decr in feet to temp and PP  COORDINATION:  finger-nose-finger, fine finger movements normal  REFLEXES:  deep tendon reflexes BUE 1,  knees 1, ankles trace  GAIT/STATION:  narrow based gait; USING WALKER     DIAGNOSTIC DATA (LABS, IMAGING, TESTING) - I reviewed patient records, labs, notes, testing and imaging myself where available.  Lab Results  Component Value Date   WBC 7.5 08/20/2023   HGB 16.1 (H) 08/20/2023   HCT 51.7 (H) 08/20/2023   MCV 86 08/20/2023   PLT 237 08/20/2023      Component Value Date/Time   NA 141 08/20/2023 1305   K 5.1 08/20/2023 1305   CL 103 08/20/2023 1305   CO2 21 08/20/2023 1305   GLUCOSE 86 08/20/2023 1305   GLUCOSE 152 (H) 01/02/2023 1200   BUN 24 08/20/2023 1305   CREATININE 0.84 08/20/2023 1305   CREATININE 0.84 12/27/2015 0833   CALCIUM 9.9 08/20/2023 1305   PROT 7.2 08/20/2023 1305   ALBUMIN 4.6 08/20/2023 1305   AST 18 08/20/2023 1305   ALT 20 08/20/2023 1305   ALKPHOS 49 08/20/2023 1305   BILITOT 0.4 08/20/2023 1305   GFRNONAA >60 01/02/2023 1200   GFRAA 38 (L) 09/29/2015 1553   Lab Results  Component Value Date   CHOL 129 08/20/2023   HDL 56 08/20/2023   LDLCALC 60 08/20/2023   TRIG 62 08/20/2023   CHOLHDL 2.8 03/13/2023   Lab Results  Component Value Date   HGBA1C 5.3 03/13/2023   Lab Results  Component Value Date   VITAMINB12 476 03/13/2023   Lab Results  Component Value Date   TSH 0.901 03/13/2023    B12 519  TSH 0.89  01/04/23 Unremarkable head CT.  No explanation for symptoms.   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  Demyelinating features noted in right peroneal motor response.  Component Ref Range & Units (hover) 7 mo ago  Total Protein, CSF 96 High       Component Ref Range & Units (hover) 7 mo ago  Glucose, CSF 62  Component Ref Range & Units (hover) 7 mo ago  Color, CSF COLORLESS  Appearance, CSF CLEAR  RBC Count, CSF 0  TOTAL NUCLEATED CELL 2   Component Ref Range & Units (hover) 7 mo ago  Oligo Bands Absent      ASSESSMENT AND  PLAN  68 y.o. 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   Dx:  1. Guillain Barr syndrome [facility])     PLAN:  ACUTE NUMBNESS WEAKNESS (? GBS / AIDP; onset ~12/16/22; preceding URI ~12/12/22; RSV vaccine ~beginning April 2024) - s/p IVIG; doing much better - fall precautions reviewed; continue to stay active  Return for pending if symptoms worsen or fail to improve, return to PCP.     Suanne Marker, MD 08/25/2023, 4:19 PM Certified in Neurology, Neurophysiology and Neuroimaging  [facility] [address] [place], [place] [address] [phone]

## 2023-09-04 ENCOUNTER — Encounter (INDEPENDENT_AMBULATORY_CARE_PROVIDER_SITE_OTHER): Payer: Self-pay | Admitting: Internal Medicine

## 2023-09-04 ENCOUNTER — Ambulatory Visit (INDEPENDENT_AMBULATORY_CARE_PROVIDER_SITE_OTHER): Payer: Medicare Other | Admitting: Internal Medicine

## 2023-09-04 VITALS — BP 137/72 | HR 92 | Temp 97.7°F | Ht 64.0 in | Wt 176.0 lb

## 2023-09-04 DIAGNOSIS — K760 Fatty (change of) liver, not elsewhere classified: Secondary | ICD-10-CM

## 2023-09-04 DIAGNOSIS — D751 Secondary polycythemia: Secondary | ICD-10-CM | POA: Diagnosis not present

## 2023-09-04 DIAGNOSIS — G4733 Obstructive sleep apnea (adult) (pediatric): Secondary | ICD-10-CM | POA: Diagnosis not present

## 2023-09-04 DIAGNOSIS — R400 Somnolence: Secondary | ICD-10-CM | POA: Diagnosis not present

## 2023-09-04 DIAGNOSIS — Z6830 Body mass index (BMI) 30.0-30.9, adult: Secondary | ICD-10-CM

## 2023-09-04 DIAGNOSIS — E66811 Obesity, class 1: Secondary | ICD-10-CM

## 2023-09-04 NOTE — Assessment & Plan Note (Signed)
 I reviewed previous CT imaging she had a CT scan in 2018 that showed hepatic steatosis which was diffuse.  She has normal liver enzymes, bilirubin and albumin and normal platelet count.  Continue with medically supervised weight management program.  Continue to work on reducing saturated fats in diet as well as simple and added sugars.  Losing 15% of body weight may improve condition also treatment with GLP-1.

## 2023-09-04 NOTE — Assessment & Plan Note (Signed)
 She has had several elevations in her hemoglobin as high as 18 in the past most recently 16 range.  She is asymptomatic.  She has sleep apnea I suggest starting with checking an overnight oxygen test she will discuss this with pulmonology or her primary care physician.  It does not yield nighttime hypoxemia then she should be evaluated for Jak mutation and renal causes.

## 2023-09-04 NOTE — Assessment & Plan Note (Signed)
 She reports good compliance with CPAP.  Upon review of her labs have noticed that she has an elevated hemoglobin since 2017 before proceeding with further evaluation I recommend that she have a overnight oximetry test to determine if she needs supplemental oxygen with her CPAP.  She is scheduled to see pulmonary in the near future and will discuss this with them.

## 2023-09-04 NOTE — Assessment & Plan Note (Signed)
 She has lost 16 pounds since starting our program.  She is notices increasing changes in close size.  She is also been tracking and journaling and some days has been going over her 1200-calorie goal she is making adjustments and is identifying sources of calories and making modifications.  She is enjoying the use of tracking app.  Continue with nutrition and behavioral strategies she has declined pharmacotherapy in the past.

## 2023-09-04 NOTE — Progress Notes (Signed)
 Office: [phone]  /  Fax: [phone]  Weight Summary And Biometrics  Vitals Temp: 97.7 F (36.5 C) BP: 137/72 Pulse Rate: 92 SpO2: 98 %   Anthropometric Measurements Height: 5' 4 (1.626 m) Weight: 176 lb (79.8 kg) BMI (Calculated): 30.2 Weight at Last Visit: 179 lb Weight Lost Since Last Visit: 3 lb Weight Gained Since Last Visit: 0 lb Starting Weight: 192 lb Total Weight Loss (lbs): 16 lb (7.258 kg) Peak Weight: 202 lb   Body Composition  Body Fat %: 40.5 % Fat Mass (lbs): 71.4 lbs Muscle Mass (lbs): 99.6 lbs Total Body Water (lbs): 66 lbs Visceral Fat Rating : 11    No data recorded Today's Visit #: 12  Starting Date: 03/13/23   Subjective   Chief Complaint: Obesity  [name] is here to discuss her progress with her obesity treatment plan. [name] is on the the Category 2 Plan and states [name] is following her eating plan approximately 75 % of the time. [name] states [name] is exercising 60 minutes 3 times per week.  Weight Progress Since Last Visit:  Since last office visit [name] has lost 3 pounds. [name] reports fair adherence to reduced calorie nutritional plan. [name] has been working on reading food labels, not skipping meals, increasing protein intake at every meal, drinking more water, journaling and tracking calories, making healthier choices, reducing portion sizes, and incorporating more whole foods   Challenges affecting patient progress: none.   Orexigenic Control: Denies problems with appetite and hunger signals.  Denies problems with satiety and satiation.  Denies problems with eating patterns and portion control.  Denies abnormal cravings. Denies feeling deprived or restricted.   Pharmacotherapy for weight management: [name] is currently taking patient has declined pharmacotherapy in past.   Assessment and Plan   Treatment Plan For Obesity:  Recommended Dietary Goals  [name] is currently in the action stage of change. As such, her goal is to  continue weight management plan. [name] has agreed to: continue current plan  Behavioral Health and Counseling  We discussed the following behavioral modification strategies today: continue to work on maintaining a reduced calorie state, getting the recommended amount of protein, incorporating whole foods, making healthy choices, staying well hydrated and practicing mindfulness when eating..  Additional education and resources provided today: None  Recommended Physical Activity Goals  [name] has been advised to work up to 150 minutes of moderate intensity aerobic activity a week and strengthening exercises 2-3 times per week for cardiovascular health, weight loss maintenance and preservation of muscle mass.   [name] has agreed to :  Think about enjoyable ways to increase daily physical activity and overcoming barriers to exercise and Increase physical activity in their day and reduce sedentary time (increase NEAT).  Pharmacotherapy  We discussed various medication options to help [name] with her weight loss efforts and we both agreed to : continue with nutritional and behavioral strategies  Associated Conditions Impacted by Obesity Treatment  OSA on CPAP Assessment & Plan: [name] reports good compliance with CPAP.  Upon review of her labs have noticed that [name] has an elevated hemoglobin since 2017 before proceeding with further evaluation I recommend that [name] have a overnight oximetry test to determine if [name] needs supplemental oxygen with her CPAP.  [name] is scheduled to see pulmonary in the near future and will discuss this with them.   Metabolic dysfunction-associated steatotic liver disease (MASLD) Assessment & Plan: I reviewed previous CT imaging [name] had a CT scan in 2018 that showed hepatic  steatosis which was diffuse.  [name] has normal liver enzymes, bilirubin and albumin and normal platelet count.  Continue with medically supervised weight management program.  Continue to work on reducing  saturated fats in diet as well as simple and added sugars.  Losing 15% of body weight may improve condition also treatment with GLP-1.   Class 1 obesity with serious comorbidity and body mass index (BMI) of 30.0 to 30.9 in adult, unspecified obesity type Assessment & Plan: [name] has lost 16 pounds since starting our program.  [name] is notices increasing changes in close size.  [name] is also been tracking and journaling and some days has been going over her 1200-calorie goal [name] is making adjustments and is identifying sources of calories and making modifications.  [name] is enjoying the use of tracking app.  Continue with nutrition and behavioral strategies [name] has declined pharmacotherapy in the past.   Polycythemia  Daytime somnolence Assessment & Plan: [name] has had several elevations in her hemoglobin as high as 18 in the past most recently 16 range.  [name] is asymptomatic.  [name] has sleep apnea I suggest starting with checking an overnight oxygen test [name] will discuss this with pulmonology or her primary care physician.  It does not yield nighttime hypoxemia then [name] should be evaluated for Jak mutation and renal causes.      Objective   Physical Exam:  Blood pressure 137/72, pulse 92, temperature 97.7 F (36.5 C), height 5' 4 (1.626 m), weight 176 lb (79.8 kg), SpO2 98%. Body mass index is 30.21 kg/m.  General: [name] is overweight, cooperative, alert, well developed, and in no acute distress. PSYCH: Has normal mood, affect and thought process.   HEENT: EOMI, sclerae are anicteric. Lungs: Normal breathing effort, no conversational dyspnea. Extremities: No edema.  Neurologic: No gross sensory or motor deficits. No tremors or fasciculations noted.    Diagnostic Data Reviewed:  BMET    Component Value Date/Time   NA 141 08/20/2023 1305   K 5.1 08/20/2023 1305   CL 103 08/20/2023 1305   CO2 21 08/20/2023 1305   GLUCOSE 86 08/20/2023 1305   GLUCOSE 152 (H) 01/02/2023 1200   BUN 24  08/20/2023 1305   CREATININE 0.84 08/20/2023 1305   CREATININE 0.84 12/27/2015 0833   CALCIUM  9.9 08/20/2023 1305   GFRNONAA >60 01/02/2023 1200   GFRAA 38 (L) 09/29/2015 1553   Lab Results  Component Value Date   HGBA1C 5.3 03/13/2023   Lab Results  Component Value Date   INSULIN  22.3 08/20/2023   INSULIN  34.0 (H) 03/13/2023   Lab Results  Component Value Date   TSH 0.901 03/13/2023   CBC    Component Value Date/Time   WBC 7.5 08/20/2023 1305   WBC 8.6 01/02/2023 1200   RBC 6.00 (H) 08/20/2023 1305   RBC 5.63 (H) 01/02/2023 1200   HGB 16.1 (H) 08/20/2023 1305   HCT 51.7 (H) 08/20/2023 1305   PLT 237 08/20/2023 1305   MCV 86 08/20/2023 1305   MCH 26.8 08/20/2023 1305   MCH 27.2 01/02/2023 1200   MCHC 31.1 (L) 08/20/2023 1305   MCHC 30.9 01/02/2023 1200   RDW 14.3 08/20/2023 1305   Iron Studies    Component Value Date/Time   IRON 77 03/13/2023 1109   TIBC 343 03/13/2023 1109   FERRITIN 59 03/13/2023 1109   IRONPCTSAT 22 03/13/2023 1109   Lipid Panel     Component Value Date/Time   CHOL 129 08/20/2023 1305   TRIG 62  08/20/2023 1305   HDL 56 08/20/2023 1305   CHOLHDL 2.8 03/13/2023 1109   LDLCALC 60 08/20/2023 1305   Hepatic Function Panel     Component Value Date/Time   PROT 7.2 08/20/2023 1305   ALBUMIN 4.6 08/20/2023 1305   AST 18 08/20/2023 1305   ALT 20 08/20/2023 1305   ALKPHOS 49 08/20/2023 1305   BILITOT 0.4 08/20/2023 1305   BILIDIR 0.15 11/14/2021 0912      Component Value Date/Time   TSH 0.901 03/13/2023 1109   TSH 2.070 10/30/2020 1417   Nutritional Lab Results  Component Value Date   VD25OH 69.1 08/20/2023   VD25OH 28.6 (L) 03/13/2023    Follow-Up   Return in about 4 weeks (around 10/02/2023) for For Weight Mangement with Dr. Francyne.SABRA [name] was informed of the importance of frequent follow up visits to maximize her success with intensive lifestyle modifications for her multiple health conditions.  Attestation Statement    Reviewed by clinician on day of visit: allergies, medications, problem list, medical history, surgical history, family history, social history, and previous encounter notes.     Lucas Francyne, MD

## 2023-09-07 ENCOUNTER — Other Ambulatory Visit: Payer: Self-pay | Admitting: Cardiology

## 2023-09-07 DIAGNOSIS — R931 Abnormal findings on diagnostic imaging of heart and coronary circulation: Secondary | ICD-10-CM

## 2023-09-07 DIAGNOSIS — E782 Mixed hyperlipidemia: Secondary | ICD-10-CM

## 2023-09-08 ENCOUNTER — Encounter: Payer: Self-pay | Admitting: Cardiology

## 2023-09-08 DIAGNOSIS — R931 Abnormal findings on diagnostic imaging of heart and coronary circulation: Secondary | ICD-10-CM

## 2023-09-08 DIAGNOSIS — E782 Mixed hyperlipidemia: Secondary | ICD-10-CM

## 2023-09-09 MED ORDER — PRAVASTATIN SODIUM 40 MG PO TABS: ORAL_TABLET | ORAL | 1 refills | Status: DC

## 2023-09-10 NOTE — Progress Notes (Unsigned)
Patient: [name] Date of Birth: [birth year]  Reason for Visit: Follow up History from: Patient Primary Neurologist: Athar-OSA  ASSESSMENT AND PLAN 68 y.o. year old female   1.  OSA on CPAP 2.  History of Guillain Barr  -Continue nightly CPAP use for minimum of 4 hours.  She has excellent compliance.  We will order an overnight pulse oximetry test.  Her physician had some concern if chronic elevated hemoglobin could be due to nocturnal hypoxemia.  Since starting on CPAP, she has not noted any subjective benefit.  ESS 6.  She has done excellent to lose 20 pounds in the last 6 months.  We discussed we may consider repeat HST in the future, HST in 2023 showed mild OSA with AHI 9.4/hour and O2 nadir of 89%.  We will plan to follow-up in 1 year or sooner if needed.  Orders Placed This Encounter  Procedures   Pulse oximetry, overnight    HISTORY OF PRESENT ILLNESS: Today 09/11/23 Here today for CPAP follow-up.  Has also been following Dr. Marjory Lies since June 2024 for rapid progressive ascending numbness and weakness.  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  Received IVIG with good benefit.  Her recent CPAP compliance for greater than 4 hours is 83%, AHI 0.6, leak 7.0. Using nasal mask. Claims has never gotten much improvement with CPAP. Is going to medical weight loss center, has been told insulin resistance, no longer eats carbs or sugar. No longer needs naps. Has lost 20 lbs since August 2024. MD mentioned elevated HGB since 2017, recommend pulse oximetry test. She owns her CPAP machine. ESS 6.  HISTORY  [name] is a 68 year old right-handed woman with an underlying medical history of SVT, status post ablation, degenerative lumbar disc disease, depression, anxiety, hyperlipidemia, hypertension, and mild obesity, who  presents for follow-up consultation of her obstructive sleep apnea on AutoPap therapy.  The patient is unaccompanied today.  She was last seen in this clinic  by Margie Ege on 01/09/2022, at which time she was compliant with her AutoPap, she subjectively felt that she needed more air and her pressure range was increased to 6 to 12 cm.  Her home sleep test from 10/03/2021 had shown an AHI of 9.4/h, O2 nadir 89% with mild to moderate snoring noted for most of the night.  Her set up date with her new AutoPap machine was 11/12/2021, her DME company is [employer].   Today, 09/12/2022: I reviewed her AutoPap compliance data from 08/12/2022 through 09/10/2022 which is a total of 30 days, during which time she used her machine only 9 days with percent use days greater than 4 hours at 30%, indicating suboptimal compliance, average usage for days on treatment of 8 hours and 2 minutes, residual AHI at goal at 0.7/h, 95th percentile of pressure at 10.6 cm with a range of 6 to 12 cm with EPR of 2.  Leak acceptable with the 95th percentile at 14.1 L/min.  In the month of December through mid January she was a lot better with her compliance and more consistent with her usage, average usage of 7 hours and 32 minutes, percent use days greater than 4 hours at 73%, average AHI 0.6/h, average pressure for the 95th percentile 10 cm, leak on the lower side with the 95th percentile at 7.9 L/min.  She reports that because of family emergencies she had to travel and she could not bring her machine and had therefore some lapses in treatment.  She  had to switch insurances since she switched over to [facility] in December and needed a attestation to her compliance and a face-to-face visit.  She had almost completed her contract with the DME company for her AutoPap machine through her previous insurance.  She reports generally being compliant with her AutoPap, she tolerates the pressure currently.  REVIEW OF SYSTEMS: Out of a complete 14 system review of symptoms, the patient complains only of the following symptoms, and all other reviewed systems are negative.  See HPI  ALLERGIES: Allergies   Allergen Reactions   Other Itching    Antiseptic Solution   Rosuvastatin Other (See Comments)    HOME MEDICATIONS: Outpatient Medications Prior to Visit  Medication Sig Dispense Refill   allopurinol (ZYLOPRIM) 100 MG tablet Take 100 mg by mouth daily.     aspirin EC 81 MG tablet Take 1 tablet (81 mg total) by mouth daily. SWALLOW WHOLE. 90 tablet 3   cetirizine (ZYRTEC) 10 MG tablet Take 10 mg by mouth in the morning.     clobetasol (OLUX) 0.05 % topical foam Apply 1 Application topically 2 (two) times daily.     Colchicine (COLCRYS PO) Take 0.6 mg by mouth. Was twice daily, now once daily, then taper     ezetimibe (ZETIA) 10 MG tablet TAKE 1 TABLET BY MOUTH EVERY DAY 90 tablet 3   fluticasone (FLONASE) 50 MCG/ACT nasal spray Place 2 sprays into both nostrils daily as needed for allergies.     pravastatin (PRAVACHOL) 40 MG tablet TAKE 1 TABLET BY MOUTH EVERY DAY IN THE EVENING 90 tablet 1   spironolactone (ALDACTONE) 50 MG tablet TAKE 1 TABLET BY MOUTH EVERY DAY 90 tablet 1   venlafaxine XR (EFFEXOR-XR) 75 MG 24 hr capsule Take 75 mg by mouth daily.     No facility-administered medications prior to visit.    PAST MEDICAL HISTORY: Past Medical History:  Diagnosis Date   ADD (attention deficit disorder)    Anxiety    Back pain    CHF (congestive heart failure) (HCC)    Constipation    Daytime somnolence 04/06/2013   DDD (degenerative disc disease), lumbar    Depression    Depression    GERD (gastroesophageal reflux disease)    Hypercholesteremia    Hypertension    IBS (irritable bowel syndrome)    Joint pain    OSA on CPAP 04/06/2013   Palpitations    Sleep apnea    Swelling of both lower extremities    Thyroid condition    Urinary incontinence     PAST SURGICAL HISTORY: Past Surgical History:  Procedure Laterality Date   ABLATION  09/29/2017   BACK SURGERY  04/06/2019   BREAST EXCISIONAL BIOPSY Left    1990s - benign   CATARACT EXTRACTION Bilateral     ELECTROPHYSIOLOGIC STUDY N/A 01/01/2016   Procedure: SVT Ablation;  Surgeon: Marinus Maw, MD;  Location: [facility] INVASIVE CV LAB;  Service: Cardiovascular;  Laterality: N/A;   HAND SURGERY Right    KNEE ARTHROPLASTY Left    NASAL SEPTUM SURGERY     RADICAL HYSTERECTOMY  2000   RIGHT HEART CATH N/A 05/22/2021   Procedure: RIGHT HEART CATH;  Surgeon: [place], Peter M, MD;  Location[facility] INVASIVE CV LAB;  Service: Cardiovascular;  Laterality: N/A;   TONSILLECTOMY AND ADENOIDECTOMY      FAMILY HISTORY: Family History  Problem Relation Age of Onset   Heart disease Mother    Hypertension Mother    Diabetes Mother  Hyperlipidemia Mother    Heart disease Father    Hypertension Father    Stroke Father    Hyperlipidemia Father    Depression Father    Anxiety disorder Father    Diabetes Maternal Grandmother    Sleep apnea Neg Hx    BRCA 1/2 Neg Hx    Breast cancer Neg Hx    Neuropathy Neg Hx     SOCIAL HISTORY: Social History   Socioeconomic History   Marital status: Married    Spouse name: Not on file   Number of children: 2   Years of education: Not on file   Highest education level: Not on file  Occupational History   Not on file  Tobacco Use   Smoking status: Never   Smokeless tobacco: Never  Vaping Use   Vaping status: Never Used  Substance and Sexual Activity   Alcohol use: Not Currently    Alcohol/week: 0.0 standard drinks of alcohol   Drug use: No   Sexual activity: Yes    Partners: Male    Birth control/protection: Surgical    Comment: hysterectomy  Other Topics Concern   Not on file  Social History Narrative   Pt lives with husband    Retired    [employer] investment banker Strain: Not on file  Food Insecurity: Not on file  Transportation Needs: Not on file  Physical Activity: Not on file  Stress: Not on file  Social Connections: Unknown (08/24/2022)   Received from [facility], [facility]   Social Network    Social Network: Not  on file  Intimate Partner Violence: Unknown (08/24/2022)   Received from [facility], [facility]   HITS    Physically Hurt: Not on file    Insult or Talk Down To: Not on file    Threaten Physical Harm: Not on file    Scream or Curse: Not on file    PHYSICAL EXAM  Vitals:   09/11/23 1420  BP: 137/73  Pulse: (!) 58  Resp: 15  SpO2: 96%  Height: 5\' 4"  (1.626 m)   Body mass index is 30.21 kg/m.  Generalized: Well developed, in no acute distress  Neurological examination  Mentation: Alert oriented to time, place, history taking. Follows all commands speech and language fluent Cranial nerve II-XII: Pupils were equal round reactive to light. Extraocular movements were full, visual field were full on confrontational test. Facial sensation and strength were normal.. Head turning and shoulder shrug  were normal and symmetric. Motor: The motor testing reveals 5 over 5 strength of all 4 extremities. Good symmetric motor tone is noted throughout.  Sensory: Sensory testing is intact to soft touch on all 4 extremities. No evidence of extinction is noted.  Coordination: Cerebellar testing reveals good finger-nose-finger and heel-to-shin bilaterally.  Gait and station: Gait is normal.  DIAGNOSTIC DATA (LABS, IMAGING, TESTING) - I reviewed patient records, labs, notes, testing and imaging myself where available.  Lab Results  Component Value Date   WBC 7.5 08/20/2023   HGB 16.1 (H) 08/20/2023   HCT 51.7 (H) 08/20/2023   MCV 86 08/20/2023   PLT 237 08/20/2023      Component Value Date/Time   NA 141 08/20/2023 1305   K 5.1 08/20/2023 1305   CL 103 08/20/2023 1305   CO2 21 08/20/2023 1305   GLUCOSE 86 08/20/2023 1305   GLUCOSE 152 (H) 01/02/2023 1200   BUN 24 08/20/2023 1305   CREATININE 0.84 08/20/2023 1305  CREATININE 0.84 12/27/2015 0833   CALCIUM 9.9 08/20/2023 1305   PROT 7.2 08/20/2023 1305   ALBUMIN 4.6 08/20/2023 1305   AST 18 08/20/2023 1305   ALT 20 08/20/2023 1305    ALKPHOS 49 08/20/2023 1305   BILITOT 0.4 08/20/2023 1305   GFRNONAA >60 01/02/2023 1200   GFRAA 38 (L) 09/29/2015 1553   Lab Results  Component Value Date   CHOL 129 08/20/2023   HDL 56 08/20/2023   LDLCALC 60 08/20/2023   TRIG 62 08/20/2023   CHOLHDL 2.8 03/13/2023   Lab Results  Component Value Date   HGBA1C 5.3 03/13/2023   Lab Results  Component Value Date   VITAMINB12 476 03/13/2023   Lab Results  Component Value Date   TSH 0.901 03/13/2023    Margie Ege, AGNP-C, DNP 09/11/2023, 2:49 PM [facility] [address], [address] [place], [place] [address] [phone]

## 2023-09-11 ENCOUNTER — Telehealth: Payer: Self-pay

## 2023-09-11 ENCOUNTER — Ambulatory Visit: Payer: Medicare Other | Admitting: Neurology

## 2023-09-11 ENCOUNTER — Encounter: Payer: Self-pay | Admitting: Neurology

## 2023-09-11 VITALS — BP 137/73 | HR 58 | Resp 15 | Ht 64.0 in

## 2023-09-11 DIAGNOSIS — G4733 Obstructive sleep apnea (adult) (pediatric): Secondary | ICD-10-CM | POA: Diagnosis not present

## 2023-09-11 NOTE — Telephone Encounter (Signed)
Epic community msg sent to dme [employer] for overnight pulse oximetry to DME.

## 2023-09-11 NOTE — Telephone Encounter (Signed)
-----   Message from Glean Salvo sent at 09/11/2023  2:53 PM EST ----- Please send order for overnight pulse oximetry to DME.  Please send message to continue current settings, send supplies as needed.  Thanks

## 2023-09-11 NOTE — Patient Instructions (Addendum)
Check overnight pulse oximetry  Continue nightly CPAP usage.  Recommend minimum of 4 hours nightly utilization.  Continue current settings.  Will send an order to DME to send supplies as needed. Follow up in 1 year

## 2023-09-23 ENCOUNTER — Encounter: Payer: Self-pay | Admitting: Neurology

## 2023-09-23 ENCOUNTER — Telehealth: Payer: Self-pay | Admitting: *Deleted

## 2023-09-23 DIAGNOSIS — G4733 Obstructive sleep apnea (adult) (pediatric): Secondary | ICD-10-CM

## 2023-09-23 NOTE — Telephone Encounter (Signed)
 Ono for this pt you want on cpap correct.  New order needs to be done.

## 2023-09-23 NOTE — Telephone Encounter (Signed)
 Blase Mess, RN; Tomasa Rand; 1 other Received, Thank you     Previous Messages    ----- Message ----- From: Guy Begin, RN Sent: 09/23/2023  10:12 AM EST To: Alain Honey; Kathe Becton; * Subject: ono                                            Good morning,  New order in epic for [employer]. [name], 68 y.o., [birth year] MRN: [id number] Code: Prior   Thanks  [name]

## 2023-10-02 ENCOUNTER — Encounter (INDEPENDENT_AMBULATORY_CARE_PROVIDER_SITE_OTHER): Payer: Self-pay | Admitting: Internal Medicine

## 2023-10-02 ENCOUNTER — Ambulatory Visit (INDEPENDENT_AMBULATORY_CARE_PROVIDER_SITE_OTHER): Payer: Medicare Other | Admitting: Internal Medicine

## 2023-10-02 VITALS — BP 151/87 | HR 77 | Temp 98.1°F | Ht 64.0 in | Wt 177.0 lb

## 2023-10-02 DIAGNOSIS — K529 Noninfective gastroenteritis and colitis, unspecified: Secondary | ICD-10-CM

## 2023-10-02 DIAGNOSIS — K760 Fatty (change of) liver, not elsewhere classified: Secondary | ICD-10-CM

## 2023-10-02 DIAGNOSIS — E66811 Obesity, class 1: Secondary | ICD-10-CM

## 2023-10-02 DIAGNOSIS — I10 Essential (primary) hypertension: Secondary | ICD-10-CM

## 2023-10-02 DIAGNOSIS — Z6830 Body mass index (BMI) 30.0-30.9, adult: Secondary | ICD-10-CM

## 2023-10-02 DIAGNOSIS — G4733 Obstructive sleep apnea (adult) (pediatric): Secondary | ICD-10-CM

## 2023-10-02 DIAGNOSIS — D751 Secondary polycythemia: Secondary | ICD-10-CM

## 2023-10-02 NOTE — Assessment & Plan Note (Signed)
 We again reviewed basal metabolic rate.  Her weight loss zone is between 1400 to 1000 cal/day, to lose a pound per day she needs to generate a 500-calorie deficit either through nutrition or exercise.  She will resume tracking and journaling.  She will do an elimination diet of lactose as I suspect she may have lactose intolerance as this likely the cause of her diarrhea.  We again reviewed other nonred meat or seafood sources of protein.

## 2023-10-02 NOTE — Assessment & Plan Note (Signed)
 Patient will be undergoing overnight oximetry.  If this is unyielding then she needs further evaluation for renal or hematological causes.

## 2023-10-02 NOTE — Progress Notes (Signed)
 Office: [phone]  /  Fax: [phone]  Weight Summary And Biometrics  Vitals Temp: 98.1 F (36.7 C) BP: (!) 151/87 Pulse Rate: 77 SpO2: 97 %   Anthropometric Measurements Height: 5\' 4"  (1.626 m) (Simultaneous filing. User may not have seen previous data.) Weight: 177 lb (80.3 kg) BMI (Calculated): 30.37 Weight at Last Visit: 176 lb (Simultaneous filing. User may not have seen previous data.) Weight Lost Since Last Visit: 0 Weight Gained Since Last Visit: 1 lb Starting Weight: 192 lb (Simultaneous filing. User may not have seen previous data.) Total Weight Loss (lbs): 15 lb (6.804 kg) Peak Weight: 202 lb   Body Composition  Body Fat %: 40.9 % Fat Mass (lbs): 72.6 lbs Muscle Mass (lbs): 99.4 lbs Total Body Water (lbs): 65.4 lbs Visceral Fat Rating : 11    No data recorded Today's Visit #: 123  Starting Date: 03/13/23   Subjective   Chief Complaint: Obesity  Interval History Discussed the use of AI scribe software for clinical note transcription with the patient, who gave verbal consent to proceed.  History of Present Illness   [name] is a 68 year old female who presents for medical weight management.  She is undergoing medical weight management and adheres to her plan about 75% of the time. She participates in water aerobics twice a week and line dancing once a week. She finds the lifestyle changes overwhelming.  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 Her blood pressure has been elevated recently, including at a doctor's office visit. She does not monitor her blood pressure at home. She is currently taking spironolactone.  She has a history of obstructive sleep apnea and uses CPAP. She describes her OSA as very mild and has discussed potentially discontinuing CPAP use with her sleep apnea doctor, but she has decided to continue due to pending oxygen monitoring.  She has not seen  a pulmonologist recently due to her previous doctor leaving the practice and is in the process of finding a new one.  She reports chronic bowel issues, with loose and uncontrollable stools, particularly in the mornings. These symptoms have been lifelong but have worsened recently. She has tried Imodium, which helps somewhat but can lead to constipation. She has not been tested for celiac disease or microscopic colitis but has undergone colonoscopies and stool tests in the past, which were normal. She is concerned about not having a gastroenterologist.  She has a history of gout and is currently managed with allopurinol. She avoids certain foods like red meat and seafood due to gout flare-ups. Her foot becomes sore after consuming ground meat or fish.  She is on Effexor and has been for a long time. She is concerned about her lactose intake, particularly from mozzarella cheese, and its potential contribution to her bowel symptoms. She has been checked for lactose intolerance in the past but is considering re-evaluating her diet.        Challenges affecting patient progress: limited food variation or intolerances.    Pharmacotherapy for weight management: She is currently taking patient has declined pharmacotherapy in past.   Assessment and Plan   Treatment Plan For Obesity:  Recommended Dietary Goals  [name] is currently in the action stage of change. As such, her goal is to continue weight management plan. She has agreed to: continue current plan and she will start tracking and journaling again she will also do elimination diet of lactose as I suspect she  may have lactose intolerance.  We also discussed incorporating intermittent fasting 16: 8.  Behavioral Health and Counseling  We discussed the following behavioral modification strategies today: continue to work on maintaining a reduced calorie state, getting the recommended amount of protein, incorporating whole foods, making healthy  choices, staying well hydrated and practicing mindfulness when eating..  Additional education and resources provided today: FODMAP handout  Recommended Physical Activity Goals  [name] has been advised to work up to 150 minutes of moderate intensity aerobic activity a week and strengthening exercises 2-3 times per week for cardiovascular health, weight loss maintenance and preservation of muscle mass.   She has agreed to :  Think about enjoyable ways to increase daily physical activity and overcoming barriers to exercise and Increase physical activity in their day and reduce sedentary time (increase NEAT).  Pharmacotherapy  We discussed various medication options to help [name] with her weight loss efforts and we both agreed to : has declined pharmacotherapy  Associated Conditions Impacted by Obesity Treatment  Primary hypertension Assessment & Plan: Her blood pressure is not at goal, repeat was 151/87.  She is currently on spironolactone.Blood pressure elevated at 151/87. Diastolic heart failure risk due to sustained hypertension. Discussed home blood pressure monitoring, especially in the morning and before bed, to ensure medication efficacy and identify pathological elevations. Discussed risks of hypertension, including worsening heart stiffness and cardiovascular complications. - Monitor blood pressure at home in the morning and before bed, follow-up with primary care blood pressure staying above goal for medication adjustments. - Goal blood pressure is 130/80 or less - Continue spironolactone   OSA on CPAP Assessment & Plan: She reports good compliance with CPAP.  She was recently seen by lung specialist and has been referred for overnight oximetry we had requested this due to the presence of polycythemia.  She tells me this will be scheduled in 3 to 4 weeks.   Metabolic dysfunction-associated steatotic liver disease (MASLD) Assessment & Plan: On CT scan in 2018 that showed diffuse  hepatic steatosis.  She has normal liver enzymes, bilirubin and albumin and normal platelet count.  Continue with medically supervised weight management program.  Continue to work on reducing saturated fats in diet as well as simple and added sugars.  Losing 15% of body weight may improve condition also treatment with GLP-1.  She declines antiobesity medications.   Class 1 obesity with serious comorbidity and body mass index (BMI) of 30.0 to 30.9 in adult, unspecified obesity type Assessment & Plan: We again reviewed basal metabolic rate.  Her weight loss zone is between 1400 to 1000 cal/day, to lose a pound per day she needs to generate a 500-calorie deficit either through nutrition or exercise.  She will resume tracking and journaling.  She will do an elimination diet of lactose as I suspect she may have lactose intolerance as this likely the cause of her diarrhea.  We again reviewed other nonred meat or seafood sources of protein.   Chronic diarrhea Assessment & Plan: Reports lifelong loose stools, recently worsened. Discussed possible lactose intolerance. Recommended elimination diet focusing on dairy. Explained lactose intolerance symptoms and expected improvement within a week of eliminating dairy. - Start an elimination diet, avoiding dairy for one week - Keep a diary of symptoms and foods consumed - Consider reintroducing foods one at a time to identify intolerances -Provided with FODMAP handout -Review records for previous celiac workup and evaluation for microscopic colitis.   Polycythemia Assessment & Plan: Patient will be undergoing overnight  oximetry.  If this is unyielding then she needs further evaluation for renal or hematological causes.       Follow-up - Follow up in 4 weeks.          Objective   Physical Exam:  Blood pressure (!) 151/87, pulse 77, temperature 98.1 F (36.7 C), height 5\' 4"  (1.626 m), weight 177 lb (80.3 kg), SpO2 97%. Body mass index is  30.38 kg/m.  General: She is overweight, cooperative, alert, well developed, and in no acute distress. PSYCH: Has normal mood, affect and thought process.   HEENT: EOMI, sclerae are anicteric. Lungs: Normal breathing effort, no conversational dyspnea. Extremities: No edema.  Neurologic: No gross sensory or motor deficits. No tremors or fasciculations noted.    Diagnostic Data Reviewed:  BMET    Component Value Date/Time   NA 141 08/20/2023 1305   K 5.1 08/20/2023 1305   CL 103 08/20/2023 1305   CO2 21 08/20/2023 1305   GLUCOSE 86 08/20/2023 1305   GLUCOSE 152 (H) 01/02/2023 1200   BUN 24 08/20/2023 1305   CREATININE 0.84 08/20/2023 1305   CREATININE 0.84 12/27/2015 0833   CALCIUM 9.9 08/20/2023 1305   GFRNONAA >60 01/02/2023 1200   GFRAA 38 (L) 09/29/2015 1553   Lab Results  Component Value Date   HGBA1C 5.3 03/13/2023   Lab Results  Component Value Date   INSULIN 22.3 08/20/2023   INSULIN 34.0 (H) 03/13/2023   Lab Results  Component Value Date   TSH 0.901 03/13/2023   CBC    Component Value Date/Time   WBC 7.5 08/20/2023 1305   WBC 8.6 01/02/2023 1200   RBC 6.00 (H) 08/20/2023 1305   RBC 5.63 (H) 01/02/2023 1200   HGB 16.1 (H) 08/20/2023 1305   HCT 51.7 (H) 08/20/2023 1305   PLT 237 08/20/2023 1305   MCV 86 08/20/2023 1305   MCH 26.8 08/20/2023 1305   MCH 27.2 01/02/2023 1200   MCHC 31.1 (L) 08/20/2023 1305   MCHC 30.9 01/02/2023 1200   RDW 14.3 08/20/2023 1305   Iron Studies    Component Value Date/Time   IRON 77 03/13/2023 1109   TIBC 343 03/13/2023 1109   FERRITIN 59 03/13/2023 1109   IRONPCTSAT 22 03/13/2023 1109   Lipid Panel     Component Value Date/Time   CHOL 129 08/20/2023 1305   TRIG 62 08/20/2023 1305   HDL 56 08/20/2023 1305   CHOLHDL 2.8 03/13/2023 1109   LDLCALC 60 08/20/2023 1305   Hepatic Function Panel     Component Value Date/Time   PROT 7.2 08/20/2023 1305   ALBUMIN 4.6 08/20/2023 1305   AST 18 08/20/2023 1305   ALT  20 08/20/2023 1305   ALKPHOS 49 08/20/2023 1305   BILITOT 0.4 08/20/2023 1305   BILIDIR 0.15 11/14/2021 0912      Component Value Date/Time   TSH 0.901 03/13/2023 1109   TSH 2.070 10/30/2020 1417   Nutritional Lab Results  Component Value Date   VD25OH 69.1 08/20/2023   VD25OH 28.6 (L) 03/13/2023    Medications: Outpatient Encounter Medications as of 10/02/2023  Medication Sig   allopurinol (ZYLOPRIM) 100 MG tablet Take 100 mg by mouth daily.   aspirin EC 81 MG tablet Take 1 tablet (81 mg total) by mouth daily. SWALLOW WHOLE.   cetirizine (ZYRTEC) 10 MG tablet Take 10 mg by mouth in the morning.   clobetasol (OLUX) 0.05 % topical foam Apply 1 Application topically 2 (two) times daily.   Colchicine (COLCRYS  PO) Take 0.6 mg by mouth. Was twice daily, now once daily, then taper   ezetimibe (ZETIA) 10 MG tablet TAKE 1 TABLET BY MOUTH EVERY DAY   fluticasone (FLONASE) 50 MCG/ACT nasal spray Place 2 sprays into both nostrils daily as needed for allergies.   pravastatin (PRAVACHOL) 40 MG tablet TAKE 1 TABLET BY MOUTH EVERY DAY IN THE EVENING   spironolactone (ALDACTONE) 50 MG tablet TAKE 1 TABLET BY MOUTH EVERY DAY   venlafaxine XR (EFFEXOR-XR) 75 MG 24 hr capsule Take 75 mg by mouth daily.   No facility-administered encounter medications on file as of 10/02/2023.     Follow-Up   Return in about 4 weeks (around 10/30/2023) for For Weight Mangement with Dr. Rikki Spearing.Marland Kitchen She was informed of the importance of frequent follow up visits to maximize her success with intensive lifestyle modifications for her multiple health conditions.  Attestation Statement   Reviewed by clinician on day of visit: allergies, medications, problem list, medical history, surgical history, family history, social history, and previous encounter notes.     Worthy Rancher, MD

## 2023-10-02 NOTE — Assessment & Plan Note (Signed)
 She reports good compliance with CPAP.  She was recently seen by lung specialist and has been referred for overnight oximetry we had requested this due to the presence of polycythemia.  She tells me this will be scheduled in 3 to 4 weeks.

## 2023-10-02 NOTE — Assessment & Plan Note (Signed)
 Her blood pressure is not at goal, repeat was 151/87.  She is currently on spironolactone.Blood pressure elevated at 151/87. Diastolic heart failure risk due to sustained hypertension. Discussed home blood pressure monitoring, especially in the morning and before bed, to ensure medication efficacy and identify pathological elevations. Discussed risks of hypertension, including worsening heart stiffness and cardiovascular complications. - Monitor blood pressure at home in the morning and before bed, follow-up with primary care blood pressure staying above goal for medication adjustments. - Goal blood pressure is 130/80 or less - Continue spironolactone

## 2023-10-02 NOTE — Assessment & Plan Note (Signed)
 On CT scan in 2018 that showed diffuse hepatic steatosis.  She has normal liver enzymes, bilirubin and albumin and normal platelet count.  Continue with medically supervised weight management program.  Continue to work on reducing saturated fats in diet as well as simple and added sugars.  Losing 15% of body weight may improve condition also treatment with GLP-1.  She declines antiobesity medications.

## 2023-10-02 NOTE — Assessment & Plan Note (Signed)
 Reports lifelong loose stools, recently worsened. Discussed possible lactose intolerance. Recommended elimination diet focusing on dairy. Explained lactose intolerance symptoms and expected improvement within a week of eliminating dairy. - Start an elimination diet, avoiding dairy for one week - Keep a diary of symptoms and foods consumed - Consider reintroducing foods one at a time to identify intolerances -Provided with FODMAP handout -Review records for previous celiac workup and evaluation for microscopic colitis.

## 2023-10-15 ENCOUNTER — Telehealth: Payer: Self-pay | Admitting: Anesthesiology

## 2023-10-15 NOTE — Telephone Encounter (Signed)
 Overnight Pulse-Oximetry Report

## 2023-10-16 NOTE — Telephone Encounter (Signed)
 Overnight pulse oximetry 10/14/2023 did not show any significant oxygen desaturation.  There was 4 hours and 12 minutes of analyzable duration.  During the time she wore the device from 12:20 AM-7:38 AM there was quite a bit of artifact.  Baseline oxygen was 93%.  Lowest was 88% for only 31 seconds.  Overall the study is adequate, does not show any significant oxygen desaturation.

## 2023-11-01 ENCOUNTER — Encounter (INDEPENDENT_AMBULATORY_CARE_PROVIDER_SITE_OTHER): Payer: Self-pay | Admitting: Internal Medicine

## 2023-11-05 ENCOUNTER — Ambulatory Visit (INDEPENDENT_AMBULATORY_CARE_PROVIDER_SITE_OTHER): Admitting: Internal Medicine

## 2023-11-19 ENCOUNTER — Ambulatory Visit (INDEPENDENT_AMBULATORY_CARE_PROVIDER_SITE_OTHER): Admitting: Internal Medicine

## 2023-11-19 ENCOUNTER — Encounter (INDEPENDENT_AMBULATORY_CARE_PROVIDER_SITE_OTHER): Payer: Self-pay | Admitting: Internal Medicine

## 2023-11-19 VITALS — BP 138/84 | HR 79 | Temp 98.3°F | Ht 64.0 in | Wt 175.0 lb

## 2023-11-19 DIAGNOSIS — K760 Fatty (change of) liver, not elsewhere classified: Secondary | ICD-10-CM | POA: Diagnosis not present

## 2023-11-19 DIAGNOSIS — D751 Secondary polycythemia: Secondary | ICD-10-CM | POA: Diagnosis not present

## 2023-11-19 DIAGNOSIS — G4733 Obstructive sleep apnea (adult) (pediatric): Secondary | ICD-10-CM

## 2023-11-19 DIAGNOSIS — Z6830 Body mass index (BMI) 30.0-30.9, adult: Secondary | ICD-10-CM

## 2023-11-19 DIAGNOSIS — E66811 Obesity, class 1: Secondary | ICD-10-CM

## 2023-11-19 DIAGNOSIS — I10 Essential (primary) hypertension: Secondary | ICD-10-CM

## 2023-11-19 DIAGNOSIS — K529 Noninfective gastroenteritis and colitis, unspecified: Secondary | ICD-10-CM

## 2023-11-19 NOTE — Assessment & Plan Note (Signed)
 On CT scan in 2018 that showed diffuse hepatic steatosis.  She has normal liver enzymes, bilirubin and albumin and normal platelet count.  Continue with medically supervised weight management program.  Continue to work on reducing saturated fats in diet as well as simple and added sugars.  Losing 15% of body weight may improve condition also treatment with GLP-1.  She declines antiobesity medications.

## 2023-11-19 NOTE — Assessment & Plan Note (Signed)
 Improved significantly with other reduction of dairy.  Continue to monitor daily intake we discussed the use of lactase as needed.

## 2023-11-19 NOTE — Assessment & Plan Note (Signed)
 I reviewed overnight oxygen test which was somewhat limited but did not show any significant nighttime hypoxemia.  I would like to repeat her CBC today and if there is still evidence of erythrocytosis, that she would benefit from checking erythropoietin levels and JAK2 mutation.  We will check CBC today she recently had a follow-up with her PCP

## 2023-11-19 NOTE — Assessment & Plan Note (Signed)
 She has lost 17 pounds since starting her program but overall 27 pounds since she started her journey.  This represents a 14% of total body weight loss using nutritional and behavioral strategies.  She has declined antiobesity medications in the past.  She will continue with current weight management strategy.  She is cutting down on dairy which should help with the calories.

## 2023-11-19 NOTE — Assessment & Plan Note (Signed)
 I reviewed home blood pressure logs.  Her blood pressures are trending downward.  She is currently on spironolactone .  Most recent renal parameters are within normal limits.  Continue current regimen monitor for orthostasis while losing weight

## 2023-11-19 NOTE — Assessment & Plan Note (Signed)
 She reports good compliance with CPAP.    I reviewed printed report from overnight oximetry, this night was kind of interrupted due to a family emergency so I only recorded about 4-1/2 hours, patient was also having difficulty sleeping.  Recording did not show significant oxygen desaturations.  This was done to rule out nighttime hypoxemia as the cause of her polycythemia.  She will continue with PAP therapy

## 2023-11-19 NOTE — Progress Notes (Signed)
 Office: [phone]  /  Fax: [phone]  Weight Summary And Biometrics  Vitals Temp: 98.3 F (36.8 C) BP: 138/84 Pulse Rate: 79 SpO2: 98 %   Anthropometric Measurements Height: 5\' 4"  (1.626 m) Weight: 175 lb (79.4 kg) BMI (Calculated): 30.02 Weight at Last Visit: 177 lb Weight Lost Since Last Visit: 2 lb Weight Gained Since Last Visit: 0 lb Starting Weight: 192 lb Total Weight Loss (lbs): 17 lb (7.711 kg) Peak Weight: 202 lb   Body Composition  Body Fat %: 40.5 % Fat Mass (lbs): 71.2 lbs Muscle Mass (lbs): 99.2 lbs Total Body Water (lbs): 67.4 lbs Visceral Fat Rating : 11    No data recorded Today's Visit #: 13  Starting Date: 03/13/23   Subjective   Chief Complaint: Obesity  Interval History Discussed the use of AI scribe software for clinical note transcription with the patient, who gave verbal consent to proceed.  History of Present Illness   [name] is a 68 year old female with hypertension and sleep apnea who presents for medical weight management.  She has lost two pounds since her last visit and adheres to a 1200 calorie meal plan 70-80% of the time without tracking calories. She consumes more whole foods, maintains adequate hydration, and does not skip meals. She is unsure if she meets the recommended protein intake. She exercises three days a week for about 60 minutes, combining strength and cardio workouts. She reports adequate sleep but acknowledges higher stress levels.  She has a history of loose stools, which have improved since she stopped eating mozzarella cheese. She still experiences anxiety related to bowel and bladder issues but notes significant improvement, allowing her to travel without major issues.  We had discussed that she is actose intolerant and has been cautious with dairy intake, opting for lactose-free products when possible.  Her blood pressure readings have shown improvement, with a noted decrease in average  readings. She attributes some higher readings to increased sodium intake from soups made with chicken broth.  She underwent a sleep study while on CPAP, which showed oxygen levels mostly above 90%. However, the study was interrupted due to her mother's fall, which may have affected the results. Her mother fell again recently, requiring assistance from her and her husband.  She has a family history of diabetes. She has discussed insulin  resistance with her doctor, who was not particularly interested in pursuing further testing.       Challenges affecting patient progress: none.    Pharmacotherapy for weight management: She is currently taking no anti-obesity medication and patient has declined pharmacotherapy in past.   Assessment and Plan   Treatment Plan For Obesity:  Recommended Dietary Goals  [name] is currently in the action stage of change. As such, her goal is to continue weight management plan. She has agreed to: continue current plan  Behavioral Health and Counseling  We discussed the following behavioral modification strategies today: continue to work on maintaining a reduced calorie state, getting the recommended amount of protein, incorporating whole foods, making healthy choices, staying well hydrated and practicing mindfulness when eating..  Additional education and resources provided today: None  Recommended Physical Activity Goals  [name] has been advised to work up to 150 minutes of moderate intensity aerobic activity a week and strengthening exercises 2-3 times per week for cardiovascular health, weight loss maintenance and preservation of muscle mass.   She has agreed to :  continue to gradually increase the amount and intensity of exercise  routine  Pharmacotherapy  We discussed various medication options to help [name] with her weight loss efforts and we both agreed to : continue with nutritional and behavioral strategies and has declined  pharmacotherapy  Associated Conditions Impacted by Obesity Treatment  Polycythemia Assessment & Plan: I reviewed overnight oxygen test which was somewhat limited but did not show any significant nighttime hypoxemia.  I would like to repeat her CBC today and if there is still evidence of erythrocytosis, that she would benefit from checking erythropoietin levels and JAK2 mutation.  We will check CBC today she recently had a follow-up with her PCP  Orders: -     CBC with Differential/Platelet  Primary hypertension Assessment & Plan: I reviewed home blood pressure logs.  Her blood pressures are trending downward.  She is currently on spironolactone .  Most recent renal parameters are within normal limits.  Continue current regimen monitor for orthostasis while losing weight   OSA on CPAP Assessment & Plan: She reports good compliance with CPAP.    I reviewed printed report from overnight oximetry, this night was kind of interrupted due to a family emergency so I only recorded about 4-1/2 hours, patient was also having difficulty sleeping.  Recording did not show significant oxygen desaturations.  This was done to rule out nighttime hypoxemia as the cause of her polycythemia.  She will continue with PAP therapy   Metabolic dysfunction-associated steatotic liver disease (MASLD) Assessment & Plan: On CT scan in 2018 that showed diffuse hepatic steatosis.  She has normal liver enzymes, bilirubin and albumin and normal platelet count.  Continue with medically supervised weight management program.  Continue to work on reducing saturated fats in diet as well as simple and added sugars.  Losing 15% of body weight may improve condition also treatment with GLP-1.  She declines antiobesity medications.   Chronic diarrhea Assessment & Plan: Improved significantly with other reduction of dairy.  Continue to monitor daily intake we discussed the use of lactase as needed.   Class 1 obesity with  serious comorbidity and body mass index (BMI) of 30.0 to 30.9 in adult, unspecified obesity type Assessment & Plan: She has lost 17 pounds since starting her program but overall 27 pounds since she started her journey.  This represents a 14% of total body weight loss using nutritional and behavioral strategies.  She has declined antiobesity medications in the past.  She will continue with current weight management strategy.  She is cutting down on dairy which should help with the calories.             Objective   Physical Exam:  Blood pressure 138/84, pulse 79, temperature 98.3 F (36.8 C), height 5\' 4"  (1.626 m), weight 175 lb (79.4 kg), SpO2 98%. Body mass index is 30.04 kg/m.  General: She is overweight, cooperative, alert, well developed, and in no acute distress. PSYCH: Has normal mood, affect and thought process.   HEENT: EOMI, sclerae are anicteric. Lungs: Normal breathing effort, no conversational dyspnea. Extremities: No edema.  Neurologic: No gross sensory or motor deficits. No tremors or fasciculations noted.    Diagnostic Data Reviewed:  BMET    Component Value Date/Time   NA 141 08/20/2023 1305   K 5.1 08/20/2023 1305   CL 103 08/20/2023 1305   CO2 21 08/20/2023 1305   GLUCOSE 86 08/20/2023 1305   GLUCOSE 152 (H) 01/02/2023 1200   BUN 24 08/20/2023 1305   CREATININE 0.84 08/20/2023 1305   CREATININE 0.84 12/27/2015 1610  CALCIUM  9.9 08/20/2023 1305   GFRNONAA >60 01/02/2023 1200   GFRAA 38 (L) 09/29/2015 1553   Lab Results  Component Value Date   HGBA1C 5.3 03/13/2023   Lab Results  Component Value Date   INSULIN  22.3 08/20/2023   INSULIN  34.0 (H) 03/13/2023   Lab Results  Component Value Date   TSH 0.901 03/13/2023   CBC    Component Value Date/Time   WBC 7.5 08/20/2023 1305   WBC 8.6 01/02/2023 1200   RBC 6.00 (H) 08/20/2023 1305   RBC 5.63 (H) 01/02/2023 1200   HGB 16.1 (H) 08/20/2023 1305   HCT 51.7 (H) 08/20/2023 1305   PLT 237  08/20/2023 1305   MCV 86 08/20/2023 1305   MCH 26.8 08/20/2023 1305   MCH 27.2 01/02/2023 1200   MCHC 31.1 (L) 08/20/2023 1305   MCHC 30.9 01/02/2023 1200   RDW 14.3 08/20/2023 1305   Iron Studies    Component Value Date/Time   IRON 77 03/13/2023 1109   TIBC 343 03/13/2023 1109   FERRITIN 59 03/13/2023 1109   IRONPCTSAT 22 03/13/2023 1109   Lipid Panel     Component Value Date/Time   CHOL 129 08/20/2023 1305   TRIG 62 08/20/2023 1305   HDL 56 08/20/2023 1305   CHOLHDL 2.8 03/13/2023 1109   LDLCALC 60 08/20/2023 1305   Hepatic Function Panel     Component Value Date/Time   PROT 7.2 08/20/2023 1305   ALBUMIN 4.6 08/20/2023 1305   AST 18 08/20/2023 1305   ALT 20 08/20/2023 1305   ALKPHOS 49 08/20/2023 1305   BILITOT 0.4 08/20/2023 1305   BILIDIR 0.15 11/14/2021 0912      Component Value Date/Time   TSH 0.901 03/13/2023 1109   TSH 2.070 10/30/2020 1417   Nutritional Lab Results  Component Value Date   VD25OH 69.1 08/20/2023   VD25OH 28.6 (L) 03/13/2023    Medications: Outpatient Encounter Medications as of 11/19/2023  Medication Sig   allopurinol (ZYLOPRIM) 100 MG tablet Take 100 mg by mouth daily.   aspirin  EC 81 MG tablet Take 1 tablet (81 mg total) by mouth daily. SWALLOW WHOLE.   cetirizine (ZYRTEC) 10 MG tablet Take 10 mg by mouth in the morning.   clobetasol (OLUX) 0.05 % topical foam Apply 1 Application topically 2 (two) times daily.   Colchicine (COLCRYS PO) Take 0.6 mg by mouth. Was twice daily, now once daily, then taper   ezetimibe  (ZETIA ) 10 MG tablet TAKE 1 TABLET BY MOUTH EVERY DAY   fluticasone (FLONASE) 50 MCG/ACT nasal spray Place 2 sprays into both nostrils daily as needed for allergies.   pravastatin  (PRAVACHOL ) 40 MG tablet TAKE 1 TABLET BY MOUTH EVERY DAY IN THE EVENING   spironolactone  (ALDACTONE ) 50 MG tablet TAKE 1 TABLET BY MOUTH EVERY DAY   venlafaxine XR (EFFEXOR-XR) 75 MG 24 hr capsule Take 75 mg by mouth daily.   No  facility-administered encounter medications on file as of 11/19/2023.     Follow-Up   Return in about 5 weeks (around 12/24/2023) for For Weight Mangement with Dr. Allie Area.Aaron Aas She was informed of the importance of frequent follow up visits to maximize her success with intensive lifestyle modifications for her multiple health conditions.  Attestation Statement   Reviewed by clinician on day of visit: allergies, medications, problem list, medical history, surgical history, family history, social history, and previous encounter notes.     Ladd Picker, MD

## 2023-11-20 LAB — CBC WITH DIFFERENTIAL/PLATELET
Basophils Absolute: 0.1 10*3/uL (ref 0.0–0.2)
Basos: 1 %
EOS (ABSOLUTE): 0.2 10*3/uL (ref 0.0–0.4)
Eos: 2 %
Hematocrit: 48.3 % — ABNORMAL HIGH (ref 34.0–46.6)
Hemoglobin: 15.2 g/dL (ref 11.1–15.9)
Immature Grans (Abs): 0 10*3/uL (ref 0.0–0.1)
Immature Granulocytes: 0 %
Lymphocytes Absolute: 1.8 10*3/uL (ref 0.7–3.1)
Lymphs: 21 %
MCH: 26.9 pg (ref 26.6–33.0)
MCHC: 31.5 g/dL (ref 31.5–35.7)
MCV: 86 fL (ref 79–97)
Monocytes Absolute: 0.5 10*3/uL (ref 0.1–0.9)
Monocytes: 6 %
Neutrophils Absolute: 5.8 10*3/uL (ref 1.4–7.0)
Neutrophils: 70 %
Platelets: 212 10*3/uL (ref 150–450)
RBC: 5.65 x10E6/uL — ABNORMAL HIGH (ref 3.77–5.28)
RDW: 13.7 % (ref 11.7–15.4)
WBC: 8.3 10*3/uL (ref 3.4–10.8)

## 2023-11-24 ENCOUNTER — Encounter (INDEPENDENT_AMBULATORY_CARE_PROVIDER_SITE_OTHER): Payer: Self-pay | Admitting: Internal Medicine

## 2023-12-04 ENCOUNTER — Telehealth: Payer: Self-pay | Admitting: Acute Care

## 2023-12-04 NOTE — Telephone Encounter (Signed)
 Patient would like to have a call about Ct that was ordered and wanted to speak with Dara Ear.

## 2023-12-05 ENCOUNTER — Ambulatory Visit (HOSPITAL_BASED_OUTPATIENT_CLINIC_OR_DEPARTMENT_OTHER)
Admission: RE | Admit: 2023-12-05 | Discharge: 2023-12-05 | Disposition: A | Discharge status: Home / Self Care | Source: Ambulatory Visit | Attending: Acute Care | Admitting: Acute Care

## 2023-12-05 DIAGNOSIS — I729 Aneurysm of unspecified site: Secondary | ICD-10-CM | POA: Insufficient documentation

## 2023-12-05 MED ORDER — IOHEXOL 350 MG/ML SOLN
75.0000 mL | Freq: Once | INTRAVENOUS | Status: AC | PRN
Start: 2023-12-05 — End: 2023-12-05
  Administered 2023-12-05: 75 mL via INTRAVENOUS

## 2023-12-09 NOTE — Telephone Encounter (Signed)
 Called patient to schedule a visit with sarah to follow up on her ct,she requested this be done at a later date will send my chart,nothing further needed as of now

## 2023-12-24 ENCOUNTER — Encounter: Payer: Self-pay | Admitting: Cardiology

## 2023-12-24 ENCOUNTER — Ambulatory Visit: Payer: Medicare Other | Attending: Cardiology | Admitting: Cardiology

## 2023-12-24 VITALS — BP 141/118 | HR 82 | Ht 64.0 in | Wt 182.6 lb

## 2023-12-24 DIAGNOSIS — I281 Aneurysm of pulmonary artery: Secondary | ICD-10-CM | POA: Insufficient documentation

## 2023-12-24 DIAGNOSIS — R931 Abnormal findings on diagnostic imaging of heart and coronary circulation: Secondary | ICD-10-CM | POA: Diagnosis present

## 2023-12-24 DIAGNOSIS — I471 Supraventricular tachycardia, unspecified: Secondary | ICD-10-CM | POA: Diagnosis present

## 2023-12-24 DIAGNOSIS — I2510 Atherosclerotic heart disease of native coronary artery without angina pectoris: Secondary | ICD-10-CM | POA: Insufficient documentation

## 2023-12-24 NOTE — Patient Instructions (Signed)

## 2023-12-24 NOTE — Progress Notes (Signed)
 Cardiology Office Note:  .   Date:  12/24/2023  ID:  Valdemar Garre, DOB 11-Jan-1956, MRN [id number] PCP: Lonzie Robins, MD  [facility] Providers Cardiologist:  Dorothye Gathers, MD     History of Present Illness: .   [name] is a 68 y.o. female Discussed the use of AI scribe software for clinical note transcription with the patient, who gave verbal consent to proceed.  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 A recent right heart catheterization showed no evidence of pulmonary hypertension, and there was no evidence of pulmonary vein thrombosis or stenosis. She has a normal variant left superior vena cava.  She has a history of supraventricular tachycardia (SVT) and underwent ablation in 2017. Since her ablation in 2019, she has experienced persistent shortness of breath. No palpitations or other symptoms related to SVT. An echocardiogram on January 29, 2023, showed normal pump function with mild aortic valve regurgitation, and her right ventricle is normal.  Her past medical history includes minimal coronary artery disease with non-obstructive findings on CT and a low-risk stress test in 2022. She is currently taking spironolactone  50 mg for her condition. She also takes Zetia  and pravachol  for cholesterol management, with her LDL at 60.  She has been logging her blood pressure, but the records are incomplete due to recent personal stressors. Her mother recently passed away at the age of 74, which has been a source of stress for her.      Studies Reviewed: Aaron Aas   EKG Interpretation Date/Time:  Wednesday Dec 24 2023 13:58:58 EDT Ventricular Rate:  82 PR Interval:  140 QRS Duration:  74 QT Interval:  370 QTC Calculation: 432 R Axis:   27  Text  Interpretation: Sinus rhythm Premature atrial complexes Low voltage QRS When compared with ECG of 02-Jan-2023 11:38, No significant change since last tracing Confirmed by Dorothye Gathers ([id number]) on 12/24/2023 2:18:38 PM    Results LABS LDL: 60 (08/2023)  RADIOLOGY CT scan: Pulmonary artery 4.5 cm, stable; normal variant left SVC (12/05/2023)  DIAGNOSTIC Right heart catheterization: No evidence of pulmonary hypertension Echocardiogram: Normal left ventricular function, mild aortic valve regurgitation, normal right ventricle (01/29/2023) Risk Assessment/Calculations:           Physical Exam:   VS:  BP (!) 141/118   Pulse 82   Ht 5\' 4"  (1.626 m)   Wt 182 lb 9.6 oz (82.8 kg)   SpO2 96%   BMI 31.34 kg/m    Wt Readings from Last 3 Encounters:  12/24/23 182 lb 9.6 oz (82.8 kg)  11/19/23 175 lb (79.4 kg)  10/02/23 177 lb (80.3 kg)    GEN: Well nourished, well developed in no acute distress NECK: No JVD; No carotid bruits CARDIAC: Ectopy noted RRR, no murmurs, no rubs, no gallops RESPIRATORY:  Clear to auscultation without rales, wheezing or rhonchi  ABDOMEN: Soft, non-tender, non-distended EXTREMITIES:  No edema; No deformity   ASSESSMENT AND PLAN: .    Assessment and Plan Assessment & Plan Pulmonary artery aneurysm Pulmonary artery aneurysm is well-managed with a stable measurement of 4.5 cm on CT scan from Dec 05, 2023. No pulmonary hypertension or vein thrombosis. Normal variant left SVC present. - Defer further imaging as the condition is stable.  Dyspnea on exertion Persistent dyspnea on exertion post-ablation in 2019. Pulmonology evaluation revealed no significant findings. Echocardiogram indicates normal cardiac function with benign mild aortic valve regurgitation. Right ventricle is normal.  Coronary artery disease without angina Minimal coronary artery disease with non-obstructive findings on CT. Low-risk stress test in 2022. Continues on spironolactone  50 mg.  Mild  aortic valve regurgitation Echocardiogram shows benign mild aortic valve regurgitation. No significant symptoms reported.  Premature ventricular contractions (PVCs) Occasional benign PVCs without symptoms. EKG confirmed absence of atrial fibrillation.  Hypertension, unspecified Blood pressure mildly elevated today. Previous readings in March were more stable. On spironolactone  50 mg, which influences blood pressure. Advised to monitor blood pressure and implement lifestyle modifications such as reducing salt intake and regular exercise. - Monitor blood pressure. - Maintain current medication regimen. - Encourage lifestyle modifications including reducing salt intake and regular exercise.  Follow-up Follow-up recommended in one year unless changes occur. - Schedule follow-up appointment in one year.         Dispo: 1 yr  Signed, Dorothye Gathers, MD

## 2023-12-25 ENCOUNTER — Other Ambulatory Visit: Payer: Self-pay

## 2023-12-25 ENCOUNTER — Ambulatory Visit
Admission: RE | Admit: 2023-12-25 | Discharge: 2023-12-25 | Disposition: A | Discharge status: Home / Self Care | Source: Ambulatory Visit | Attending: Nurse Practitioner | Admitting: Nurse Practitioner

## 2023-12-25 VITALS — BP 134/83 | HR 62 | Temp 98.5°F | Resp 18

## 2023-12-25 DIAGNOSIS — J309 Allergic rhinitis, unspecified: Secondary | ICD-10-CM

## 2023-12-25 MED ORDER — FLUTICASONE PROPIONATE 50 MCG/ACT NA SUSP: 2.0000 | Freq: Every day | NASAL | 0 refills | Status: AC

## 2023-12-25 MED ORDER — PREDNISONE 10 MG (21) PO TBPK: ORAL_TABLET | Freq: Every day | ORAL | 0 refills | Status: DC

## 2023-12-25 MED ORDER — PSEUDOEPH-BROMPHEN-DM 30-2-10 MG/5ML PO SYRP: 10.0000 mL | ORAL_SOLUTION | Freq: Four times a day (QID) | ORAL | 0 refills | Status: DC | PRN

## 2023-12-25 MED ORDER — DEXAMETHASONE SODIUM PHOSPHATE 10 MG/ML IJ SOLN
10.0000 mg | Freq: Once | INTRAMUSCULAR | Status: AC
  Administered 2023-12-25: 10 mg via INTRAMUSCULAR

## 2023-12-25 NOTE — Telephone Encounter (Signed)
 Put in a recall.NFN at this time

## 2023-12-25 NOTE — ED Provider Notes (Signed)
 [facility]    CSN: [id number] Arrival date & time: 12/25/23  1542      History   Chief Complaint Chief Complaint  Patient presents with   Nasal Congestion    Sinus congestion, coughing, phlegm, etc. - Entered by patient    HPI [name] is a 68 y.o. female.   [name] is a 68 y.o. female that presents with congestion, sore throat, and cough that started after exposure to funeral flowers and house cleaning following her mother's recent passing. She has since disposed of the flowers but still having symptoms. The patient reports congestion that initially started in the head and has progressed to include loss of voice as of this morning. She has sneezing, yellow-green phlegm production, and a scratchy throat. The patient has experienced chills but denies fever, noting a normal temperature when checked yesterday. They report coughing up phlegm but deny wheezing or shortness of breath. Associated symptoms include itchy and watery eyes. The patient denies nausea, vomiting, or diarrhea. The patient takes Zyrtec daily and guaifenesin. She has used Flonase in the past but not recently because she ran out.   The following portions of the patient's history were reviewed and updated as appropriate: allergies, current medications, past family history, past medical history, past social history, past surgical history, and problem list.    Past Medical History:  Diagnosis Date   ADD (attention deficit disorder)    Anxiety    Back pain    CHF (congestive heart failure) (HCC)    Constipation    Daytime somnolence 04/06/2013   DDD (degenerative disc disease), lumbar    Depression    Depression    GERD (gastroesophageal reflux disease)    Hypercholesteremia    Hypertension    IBS (irritable bowel syndrome)    Joint pain    OSA on CPAP 04/06/2013   Palpitations    Sleep apnea    Swelling of both lower extremities    Thyroid  condition    Urinary incontinence      Patient Active Problem List   Diagnosis Date Noted   Chronic diarrhea 10/02/2023   Polycythemia 09/04/2023   Class 1 obesity with serious comorbidity and body mass index (BMI) of 30.0 to 30.9 in adult 07/03/2023   Irritable bowel syndrome with both constipation and diarrhea 06/19/2023   Insulin  resistance 06/04/2023   Metabolic dysfunction-associated steatotic liver disease (MASLD) 06/04/2023   Vitamin D  deficiency 03/26/2023   Gout 03/13/2023   Iron deficiency 03/13/2023   Unspecified severe protein-calorie malnutrition (HCC) 03/13/2023   Generalized obesity 03/13/2023   BMI 30.0-30.9,adult 03/13/2023   Guillain Barr syndrome (HCC) 01/16/2023   Coronary artery disease involving native coronary artery of native heart without angina pectoris 08/24/2021   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   Elevated coronary artery calcium  score 12/06/2020   Mixed hyperlipidemia 10/12/2020   Primary hypertension 01/19/2020   Chronic diastolic (congestive) heart failure (HCC) 01/19/2020   Dyspnea on exertion 11/05/2019   Hiatal hernia 11/05/2019   PSVT (paroxysmal supraventricular tachycardia) (HCC) 01/01/2016   Thyroid  nodule 04/14/2013   OSA on CPAP 04/06/2013   Daytime somnolence 04/06/2013    Past Surgical History:  Procedure Laterality Date   ABLATION  09/29/2017   BACK SURGERY  04/06/2019   BREAST EXCISIONAL BIOPSY Left    1990s - benign   CATARACT EXTRACTION Bilateral    ELECTROPHYSIOLOGIC STUDY N/A 01/01/2016   Procedure: SVT Ablation;  Surgeon: Tammie Fall, MD;  Location: [facility] INVASIVE  CV LAB;  Service: Cardiovascular;  Laterality: N/A;   HAND SURGERY Right    KNEE ARTHROPLASTY Left    NASAL SEPTUM SURGERY     RADICAL HYSTERECTOMY  2000   RIGHT HEART CATH N/A 05/22/2021   Procedure: RIGHT HEART CATH;  Surgeon: [place], Peter M, MD;  Location: [facility] INVASIVE CV LAB;  Service: Cardiovascular;  Laterality: N/A;   TONSILLECTOMY AND ADENOIDECTOMY      OB History      Gravida  2   Para  2   Term  2   Preterm      AB      Living  4      SAB      IAB      Ectopic      Multiple      Live Births  2            Home Medications    Prior to Admission medications   Medication Sig Start Date End Date Taking? Authorizing Provider  allopurinol (ZYLOPRIM) 100 MG tablet Take 100 mg by mouth daily. 09/09/22  Yes [provider]  aspirin  EC 81 MG tablet Take 1 tablet (81 mg total) by mouth daily. SWALLOW WHOLE. 04/23/22  Yes Hugh Madura, MD  brompheniramine-pseudoephedrine-DM 30-2-10 MG/5ML syrup Take 10 mLs by mouth every 6 (six) hours as needed (cough and congestion). 12/25/23  Yes Maryruth Sol, FNP  cetirizine (ZYRTEC) 10 MG tablet Take 10 mg by mouth in the morning.   Yes [provider]  clobetasol (OLUX) 0.05 % topical foam Apply 1 Application topically 2 (two) times daily. 01/28/23  Yes [provider]  ezetimibe  (ZETIA ) 10 MG tablet TAKE 1 TABLET BY MOUTH EVERY DAY 12/13/22  Yes Hugh Madura, MD  fluticasone (FLONASE) 50 MCG/ACT nasal spray Place 2 sprays into both nostrils daily. 12/25/23  Yes Adelina Collard, Ova Bloomer, FNP  pravastatin  (PRAVACHOL ) 40 MG tablet TAKE 1 TABLET BY MOUTH EVERY DAY IN THE EVENING 09/09/23  Yes Hugh Madura, MD  predniSONE (STERAPRED UNI-PAK 21 TAB) 10 MG (21) TBPK tablet Take by mouth daily. START IN MORNING. Take 6 tabs by mouth daily for 2 days, then 5 tabs for 2 days, then 4 tabs for 2 days, then 3 tabs for 2 days, 2 tabs for 2 days, then 1 tab by mouth daily for 2 days 12/25/23  Yes Rhyker Silversmith, Ova Bloomer, FNP  spironolactone  (ALDACTONE ) 50 MG tablet TAKE 1 TABLET BY MOUTH EVERY DAY 07/21/23  Yes Hugh Madura, MD  venlafaxine XR (EFFEXOR-XR) 75 MG 24 hr capsule Take 75 mg by mouth daily. 12/04/22  Yes [provider]  Colchicine (COLCRYS PO) Take 0.6 mg by mouth. Was twice daily, now once daily, then taper    [provider]    Family History Family History  Problem  Relation Age of Onset   Heart disease Mother    Hypertension Mother    Diabetes Mother    Hyperlipidemia Mother    Heart disease Father    Hypertension Father    Stroke Father    Hyperlipidemia Father    Depression Father    Anxiety disorder Father    Diabetes Maternal Grandmother    Sleep apnea Neg Hx    BRCA 1/2 Neg Hx    Breast cancer Neg Hx    Neuropathy Neg Hx     Social History Social History   Tobacco Use   Smoking status: Never   Smokeless tobacco: Never  Vaping Use  Vaping status: Never Used  Substance Use Topics   Alcohol use: Not Currently    Alcohol/week: 0.0 standard drinks of alcohol   Drug use: No     Allergies   Other and Rosuvastatin    Review of Systems Review of Systems  Constitutional:  Positive for chills. Negative for fever.  HENT:  Positive for congestion, ear pain, postnasal drip, rhinorrhea, sinus pressure (across forehead) and voice change (hoarse). Negative for sore throat (scratchy; no pain).   Eyes:  Positive for itching.  Respiratory:  Positive for cough (productive) and shortness of breath (a little). Negative for wheezing.   Gastrointestinal:  Negative for diarrhea, nausea and vomiting.  Musculoskeletal:  Negative for myalgias.  Neurological:  Positive for headaches (mild).  All other systems reviewed and are negative.    Physical Exam Triage Vital Signs ED Triage Vitals  Encounter Vitals Group     BP 12/25/23 1601 134/83     Systolic BP Percentile --      Diastolic BP Percentile --      Pulse Rate 12/25/23 1601 64     Resp 12/25/23 1601 18     Temp 12/25/23 1601 98.2 F (36.8 C)     Temp src --      SpO2 12/25/23 1601 94 %     Weight --      Height --      Head Circumference --      Peak Flow --      Pain Score 12/25/23 1558 7     Pain Loc --      Pain Education --      Exclude from Growth Chart --    No data found.  Updated Vital Signs BP 134/83   Pulse 62   Temp 98.5 F (36.9 C) (Oral)   Resp 18   SpO2  98%   Visual Acuity Right Eye Distance:   Left Eye Distance:   Bilateral Distance:    Right Eye Near:   Left Eye Near:    Bilateral Near:     Physical Exam Vitals reviewed.  Constitutional:      General: She is awake. She is not in acute distress.    Appearance: Normal appearance. She is well-developed. She is not ill-appearing, toxic-appearing or diaphoretic.  HENT:     Head: Normocephalic.     Right Ear: Hearing, tympanic membrane, ear canal and external ear normal. No drainage, swelling or tenderness. No middle ear effusion. Tympanic membrane is not erythematous.     Left Ear: Hearing, tympanic membrane, ear canal and external ear normal. No drainage, swelling or tenderness.  No middle ear effusion. Tympanic membrane is not erythematous.     Nose: Congestion present.     Right Sinus: Frontal sinus tenderness present. No maxillary sinus tenderness.     Left Sinus: Frontal sinus tenderness present. No maxillary sinus tenderness.     Mouth/Throat:     Lips: Pink.     Mouth: Mucous membranes are moist.     Pharynx: Oropharynx is clear. Uvula midline. No pharyngeal swelling, oropharyngeal exudate, posterior oropharyngeal erythema or uvula swelling.     Tonsils: No tonsillar exudate or tonsillar abscesses.  Eyes:     General: Vision grossly intact.     Conjunctiva/sclera: Conjunctivae normal.  Cardiovascular:     Rate and Rhythm: Normal rate and regular rhythm.     Heart sounds: Normal heart sounds.  Pulmonary:     Effort: Pulmonary effort is normal.  Breath sounds: Normal breath sounds and air entry.  Musculoskeletal:        General: Normal range of motion.     Cervical back: Full passive range of motion without pain, normal range of motion and neck supple.  Lymphadenopathy:     Cervical: No cervical adenopathy.  Skin:    General: Skin is warm and dry.  Neurological:     General: No focal deficit present.     Mental Status: She is alert and oriented to person, place,  and time.  Psychiatric:        Mood and Affect: Mood normal.        Behavior: Behavior normal. Behavior is cooperative.      UC Treatments / Results  Labs (all labs ordered are listed, but only abnormal results are displayed) Labs Reviewed - No data to display  EKG   Radiology No results found.  Procedures Procedures (including critical care time)  Medications Ordered in UC Medications  dexamethasone  (DECADRON ) injection 10 mg (has no administration in time range)    Initial Impression / Assessment and Plan / UC Course  I have reviewed the triage vital signs and the nursing notes.  Pertinent labs & imaging results that were available during my care of the patient were reviewed by me and considered in my medical decision making (see chart for details).     Patient presents with symptoms consistent with an acute upper respiratory infection likely triggered or exacerbated by allergic rhinitis, following recent exposure to funeral flowers and house cleaning. Symptoms include nasal congestion, sneezing, itchy and watery eyes, scratchy throat, productive cough with green-yellow phlegm, voice loss, and chills. No fever, wheezing, or shortness of breath reported. Patient has been taking Zyrtec and guaifenesin. The color of the phlegm is attributed to pollen exposure rather than bacterial infection. A decadron  injection was administered in the office, and the patient will begin an oral prednisone  taper in the morning. Flonase  nasal spray is to be restarted with proper technique reviewed and Zyrtec and guaifenesin are to be continued. Bromfed DM was prescribed for symptom relief. Supportive care includes Tylenol  as needed, humidifier, and saline nasal spray. Patient was advised to change or sanitize their toothbrush once symptoms begin to improve.   Today's evaluation has revealed no signs of a dangerous process. Discussed diagnosis with patient and/or guardian. Patient and/or guardian  aware of their diagnosis, possible red flag symptoms to watch out for and need for close follow up. Patient and/or guardian understands verbal and written discharge instructions. Patient and/or guardian comfortable with plan and disposition.  Patient and/or guardian has a clear mental status at this time, good insight into illness (after discussion and teaching) and has clear judgment to make decisions regarding their care  Documentation was completed with the aid of voice recognition software. Transcription may contain typographical errors. Final Clinical Impressions(s) / UC Diagnoses   Final diagnoses:  Allergic sinusitis     Discharge Instructions      You have a sinus infection, also known as sinusitis, which is inflammation of the sinuses. Your symptoms are most likely due to allergies rather than a bacterial infection, so antibiotics are not needed. Allergies can cause inflammation and mucus buildup, leading to pressure, congestion, and discomfort.  Take the medications that were prescribed to you as directed. If you have a fever, headache, or body aches, you can also take Tylenol  or ibuprofen to help you feel more comfortable. Be sure to drink plenty of fluids to stay  hydrated--aim for enough to keep your urine a pale yellow color. This will also help to thin mucus and make it easier to clear from your body.   Using a cool mist humidifier at home to keep humidity levels above 50% can be helpful. You can also inhale steam for 10 to 15 minutes, 3 to 4 times a day. This can be done by sitting in the bathroom with a hot shower running, or by using over-the-counter vapor shower tablets to help with nasal congestion. Try to avoid cool or dry air as much as possible. When you sleep, keep your head elevated to help reduce post-nasal drainage. Be sure to get enough rest every night to support your recovery. Don't forget to replace your toothbrush once you start feeling better.   It's normal for a  cough to linger for several weeks after a respiratory illness, even after other symptoms have resolved. This happens because the airways remain irritated and take time to fully heal. As long as the cough gradually improves and there are no new concerning symptoms, this is part of the normal recovery process.  If your symptoms get worse or if you develop any new or concerning symptoms, go to the emergency room right away. If you're not feeling better in a few days, follow up with your primary care provider.          ED Prescriptions     Medication Sig Dispense Auth. Provider   fluticasone (FLONASE) 50 MCG/ACT nasal spray Place 2 sprays into both nostrils daily. 16 g Maryruth Sol, FNP   predniSONE (STERAPRED UNI-PAK 21 TAB) 10 MG (21) TBPK tablet Take by mouth daily. START IN MORNING. Take 6 tabs by mouth daily for 2 days, then 5 tabs for 2 days, then 4 tabs for 2 days, then 3 tabs for 2 days, 2 tabs for 2 days, then 1 tab by mouth daily for 2 days 42 tablet Maryruth Sol, FNP   brompheniramine-pseudoephedrine-DM 30-2-10 MG/5ML syrup Take 10 mLs by mouth every 6 (six) hours as needed (cough and congestion). 120 mL Maryruth Sol, FNP      PDMP not reviewed this encounter.   Maryruth Sol, [place] 12/25/23 [id number]

## 2023-12-25 NOTE — Discharge Instructions (Addendum)
 You have a sinus infection, also known as sinusitis, which is inflammation of the sinuses. Your symptoms are most likely due to allergies rather than a bacterial infection, so antibiotics are not needed. Allergies can cause inflammation and mucus buildup, leading to pressure, congestion, and discomfort.  Take the medications that were prescribed to you as directed. If you have a fever, headache, or body aches, you can also take Tylenol  or ibuprofen to help you feel more comfortable. Be sure to drink plenty of fluids to stay hydrated--aim for enough to keep your urine a pale yellow color. This will also help to thin mucus and make it easier to clear from your body.   Using a cool mist humidifier at home to keep humidity levels above 50% can be helpful. You can also inhale steam for 10 to 15 minutes, 3 to 4 times a day. This can be done by sitting in the bathroom with a hot shower running, or by using over-the-counter vapor shower tablets to help with nasal congestion. Try to avoid cool or dry air as much as possible. When you sleep, keep your head elevated to help reduce post-nasal drainage. Be sure to get enough rest every night to support your recovery. Don't forget to replace your toothbrush once you start feeling better.   It's normal for a cough to linger for several weeks after a respiratory illness, even after other symptoms have resolved. This happens because the airways remain irritated and take time to fully heal. As long as the cough gradually improves and there are no new concerning symptoms, this is part of the normal recovery process.  If your symptoms get worse or if you develop any new or concerning symptoms, go to the emergency room right away. If you're not feeling better in a few days, follow up with your primary care provider.

## 2023-12-25 NOTE — ED Triage Notes (Signed)
 PT reports congestion,pressure on ears,eyes have been watering. Pt reports it feels like her head is going to explode.

## 2024-01-12 ENCOUNTER — Ambulatory Visit: Admitting: Acute Care

## 2024-01-14 ENCOUNTER — Ambulatory Visit (INDEPENDENT_AMBULATORY_CARE_PROVIDER_SITE_OTHER): Admitting: Internal Medicine

## 2024-01-14 ENCOUNTER — Encounter (INDEPENDENT_AMBULATORY_CARE_PROVIDER_SITE_OTHER): Payer: Self-pay | Admitting: Internal Medicine

## 2024-01-14 VITALS — BP 142/81 | HR 94 | Temp 98.8°F | Ht 64.0 in | Wt 179.0 lb

## 2024-01-14 DIAGNOSIS — K760 Fatty (change of) liver, not elsewhere classified: Secondary | ICD-10-CM | POA: Diagnosis not present

## 2024-01-14 DIAGNOSIS — E66811 Obesity, class 1: Secondary | ICD-10-CM

## 2024-01-14 DIAGNOSIS — D751 Secondary polycythemia: Secondary | ICD-10-CM | POA: Diagnosis not present

## 2024-01-14 DIAGNOSIS — Z6830 Body mass index (BMI) 30.0-30.9, adult: Secondary | ICD-10-CM

## 2024-01-14 DIAGNOSIS — G4733 Obstructive sleep apnea (adult) (pediatric): Secondary | ICD-10-CM | POA: Diagnosis not present

## 2024-01-14 NOTE — Progress Notes (Unsigned)
 Office: [phone]  /  Fax: [phone]  Weight Summary And Biometrics  Vitals Temp: 98.8 F (37.1 C) BP: (!) 142/81 Pulse Rate: 94 SpO2: 97 %   Anthropometric Measurements Height: 5' 4 (1.626 m) Weight: 179 lb (81.2 kg) BMI (Calculated): 30.71 Weight at Last Visit: 175 lb Weight Lost Since Last Visit: 4 lb Weight Gained Since Last Visit: 0 lb Starting Weight: 192 lb Total Weight Loss (lbs): 13 lb (5.897 kg) Peak Weight: 202 lb   Body Composition  Body Fat %: 40.9 % Fat Mass (lbs): 73.4 lbs Muscle Mass (lbs): 100.8 lbs Total Body Water (lbs): 69 lbs Visceral Fat Rating : 110    RMR: 1469  Today's Visit #: 14  Starting Date: 03/13/23   Subjective   Chief Complaint: Obesity  Interval History Discussed the use of AI scribe software for clinical note transcription with the patient, who gave verbal consent to proceed.  History of Present Illness   [name] is a 68 year old female who presents for medical weight management.  She has experienced a weight gain of four pounds since her last office visit. She follows a 1200 calorie plan about 50% of the time. She attributes the weight gain to recent life stressors and a course of prednisone .  She recently completed a tapering course of prednisone , which lasted for six days of six pills, followed by five days of five pills, and so on, down to one pill. She believes this medication may have contributed to her weight gain.  She engages in physical activities such as water aerobics on Mondays and Thursdays, and line dancing on Fridays.  She discusses the recent passing of a family member, which has been a significant life event. This involved cleaning out a house and managing funeral arrangements, which she describes as a relief now that it is over. She has been busy with these activities and is now looking to return to her normal routine.        Challenges affecting patient progress: recent lapse in  weight management plan due to work, travel or family circumstances.    Pharmacotherapy for weight management: She is currently taking no anti-obesity medication.   Assessment and Plan   Treatment Plan For Obesity:  Recommended Dietary Goals  Darriel is currently in the action stage of change. As such, her goal is to continue weight management plan. She has agreed to: continue to work on implementation of reduced calorie nutrition plan (RCNP)  Behavioral Health and Counseling  We discussed the following behavioral modification strategies today: continue to work on maintaining a reduced calorie state, getting the recommended amount of protein, incorporating whole foods, making healthy choices, staying well hydrated and practicing mindfulness when eating..  Additional education and resources provided today: None  Recommended Physical Activity Goals  [name] has been advised to work up to 150 minutes of moderate intensity aerobic activity a week and strengthening exercises 2-3 times per week for cardiovascular health, weight loss maintenance and preservation of muscle mass.   She has agreed to :  continue to gradually increase the amount and intensity of exercise routine  Pharmacotherapy  We discussed various medication options to help [name] with her weight loss efforts and we both agreed to : Continue with current nutritional and behavioral strategies  Associated Conditions Impacted by Obesity Treatment  Metabolic dysfunction-associated steatotic liver disease (MASLD) Assessment & Plan: On CT scan in 2018 that showed diffuse hepatic steatosis.  Most recent labs showed normal liver enzymes  and platelet count.  Fibrosis 4 Score = 1.25  Fib-4 interpretation is not validated for people under 35 or over 90 years of age. However, scores under 2.0 are generally considered low risk.   Continue with medically supervised weight management program.  Continue to work on reducing saturated fats  in diet as well as simple and added sugars.  Losing 15% of body weight may improve condition also treatment with GLP-1.  She has declined antiobesity medications.   OSA on CPAP Assessment & Plan: She reports good compliance with CPAP.  Losing 15% of body weight may reduce AHI continue with current weight management strategy     Class 1 obesity with serious comorbidity and body mass index (BMI) of 30.0 to 30.9 in adult, unspecified obesity type Assessment & Plan: She had lost 17 pounds but recently regained 4 due to lapse in treatment because of family circumstances.  She has gained four pounds since the last visit, attributed to recent life stressors, including the passing of her mother, and a course of prednisone . She reports following a 1200 calorie plan about 50% of the time. Reassurance provided that a four-pound gain is not significant. Discussed strategies to get back on track with weight management, including dietary adjustments and increased physical activity. - Encourage adherence to a 1200-1500 calorie diet based on physical activity. - Recommend increasing physical activity, including walking, water aerobics, and line dancing. - Suggest meal replacements such as protein shakes (e.g., OWYN, Orgain) for convenience. - Discuss intermittent fasting as an option, with an 8-hour eating window and 16-hour fasting period. - Print list of meal replacement options for her.   Polycythemia Assessment & Plan: I reviewed overnight oxygen test which was somewhat limited but did not show any significant nighttime hypoxemia.  Follow-up CBC shows normal RBC mass and hematocrit.  Repeat in 6 months and if abnormal check erythropoietin and JAK2             Objective   Physical Exam:  Blood pressure (!) 142/81, pulse 94, temperature 98.8 F (37.1 C), height 5' 4 (1.626 m), weight 179 lb (81.2 kg), SpO2 97%. Body mass index is 30.73 kg/m.  General: She is overweight, cooperative,  alert, well developed, and in no acute distress. PSYCH: Has normal mood, affect and thought process.   HEENT: EOMI, sclerae are anicteric. Lungs: Normal breathing effort, no conversational dyspnea. Extremities: No edema.  Neurologic: No gross sensory or motor deficits. No tremors or fasciculations noted.    Diagnostic Data Reviewed:  BMET    Component Value Date/Time   NA 141 08/20/2023 1305   K 5.1 08/20/2023 1305   CL 103 08/20/2023 1305   CO2 21 08/20/2023 1305   GLUCOSE 86 08/20/2023 1305   GLUCOSE 152 (H) 01/02/2023 1200   BUN 24 08/20/2023 1305   CREATININE 0.84 08/20/2023 1305   CREATININE 0.84 12/27/2015 0833   CALCIUM  9.9 08/20/2023 1305   GFRNONAA >60 01/02/2023 1200   GFRAA 38 (L) 09/29/2015 1553   Lab Results  Component Value Date   HGBA1C 5.3 03/13/2023   Lab Results  Component Value Date   INSULIN  22.3 08/20/2023   INSULIN  34.0 (H) 03/13/2023   Lab Results  Component Value Date   TSH 0.901 03/13/2023   CBC    Component Value Date/Time   WBC 8.3 11/19/2023 1559   WBC 8.6 01/02/2023 1200   RBC 5.65 (H) 11/19/2023 1559   RBC 5.63 (H) 01/02/2023 1200   HGB 15.2 11/19/2023 1559  HCT 48.3 (H) 11/19/2023 1559   PLT 212 11/19/2023 1559   MCV 86 11/19/2023 1559   MCH 26.9 11/19/2023 1559   MCH 27.2 01/02/2023 1200   MCHC 31.5 11/19/2023 1559   MCHC 30.9 01/02/2023 1200   RDW 13.7 11/19/2023 1559   Iron Studies    Component Value Date/Time   IRON 77 03/13/2023 1109   TIBC 343 03/13/2023 1109   FERRITIN 59 03/13/2023 1109   IRONPCTSAT 22 03/13/2023 1109   Lipid Panel     Component Value Date/Time   CHOL 129 08/20/2023 1305   TRIG 62 08/20/2023 1305   HDL 56 08/20/2023 1305   CHOLHDL 2.8 03/13/2023 1109   LDLCALC 60 08/20/2023 1305   Hepatic Function Panel     Component Value Date/Time   PROT 7.2 08/20/2023 1305   ALBUMIN 4.6 08/20/2023 1305   AST 18 08/20/2023 1305   ALT 20 08/20/2023 1305   ALKPHOS 49 08/20/2023 1305   BILITOT 0.4  08/20/2023 1305   BILIDIR 0.15 11/14/2021 0912      Component Value Date/Time   TSH 0.901 03/13/2023 1109   TSH 2.070 10/30/2020 1417   Nutritional Lab Results  Component Value Date   VD25OH 69.1 08/20/2023   VD25OH 28.6 (L) 03/13/2023    Medications: Outpatient Encounter Medications as of 01/14/2024  Medication Sig   allopurinol (ZYLOPRIM) 100 MG tablet Take 100 mg by mouth daily.   aspirin  EC 81 MG tablet Take 1 tablet (81 mg total) by mouth daily. SWALLOW WHOLE.   brompheniramine-pseudoephedrine-DM 30-2-10 MG/5ML syrup Take 10 mLs by mouth every 6 (six) hours as needed (cough and congestion).   cetirizine (ZYRTEC) 10 MG tablet Take 10 mg by mouth in the morning.   clobetasol (OLUX) 0.05 % topical foam Apply 1 Application topically 2 (two) times daily.   Colchicine (COLCRYS PO) Take 0.6 mg by mouth. Was twice daily, now once daily, then taper   ezetimibe  (ZETIA ) 10 MG tablet TAKE 1 TABLET BY MOUTH EVERY DAY   fluticasone  (FLONASE ) 50 MCG/ACT nasal spray Place 2 sprays into both nostrils daily.   pravastatin  (PRAVACHOL ) 40 MG tablet TAKE 1 TABLET BY MOUTH EVERY DAY IN THE EVENING   predniSONE  (STERAPRED UNI-PAK 21 TAB) 10 MG (21) TBPK tablet Take by mouth daily. START IN MORNING. Take 6 tabs by mouth daily for 2 days, then 5 tabs for 2 days, then 4 tabs for 2 days, then 3 tabs for 2 days, 2 tabs for 2 days, then 1 tab by mouth daily for 2 days   spironolactone  (ALDACTONE ) 50 MG tablet TAKE 1 TABLET BY MOUTH EVERY DAY   venlafaxine XR (EFFEXOR-XR) 75 MG 24 hr capsule Take 75 mg by mouth daily.   No facility-administered encounter medications on file as of 01/14/2024.     Follow-Up   Return in about 4 weeks (around 02/11/2024).Aaron Aas She was informed of the importance of frequent follow up visits to maximize her success with intensive lifestyle modifications for her multiple health conditions.  Attestation Statement   Reviewed by clinician on day of visit: allergies, medications,  problem list, medical history, surgical history, family history, social history, and previous encounter notes.     Ladd Picker, MD

## 2024-01-15 NOTE — Assessment & Plan Note (Signed)
 On CT scan in 2018 that showed diffuse hepatic steatosis.  Most recent labs showed normal liver enzymes and platelet count.  Fibrosis 4 Score = 1.25  Fib-4 interpretation is not validated for people under 35 or over 67 years of age. However, scores under 2.0 are generally considered low risk.   Continue with medically supervised weight management program.  Continue to work on reducing saturated fats in diet as well as simple and added sugars.  Losing 15% of body weight may improve condition also treatment with GLP-1.  She has declined antiobesity medications.

## 2024-01-15 NOTE — Assessment & Plan Note (Signed)
 She had lost 17 pounds but recently regained 4 due to lapse in treatment because of family circumstances.  She has gained four pounds since the last visit, attributed to recent life stressors, including the passing of her mother, and a course of prednisone . She reports following a 1200 calorie plan about 50% of the time. Reassurance provided that a four-pound gain is not significant. Discussed strategies to get back on track with weight management, including dietary adjustments and increased physical activity. - Encourage adherence to a 1200-1500 calorie diet based on physical activity. - Recommend increasing physical activity, including walking, water aerobics, and line dancing. - Suggest meal replacements such as protein shakes (e.g., OWYN, Orgain) for convenience. - Discuss intermittent fasting as an option, with an 8-hour eating window and 16-hour fasting period. - Print list of meal replacement options for her.

## 2024-01-15 NOTE — Assessment & Plan Note (Signed)
 I reviewed overnight oxygen test which was somewhat limited but did not show any significant nighttime hypoxemia.  Follow-up CBC shows normal RBC mass and hematocrit.  Repeat in 6 months and if abnormal check erythropoietin and JAK2

## 2024-01-15 NOTE — Assessment & Plan Note (Signed)
 She reports good compliance with CPAP.  Losing 15% of body weight may reduce AHI continue with current weight management strategy

## 2024-01-28 ENCOUNTER — Other Ambulatory Visit (HOSPITAL_BASED_OUTPATIENT_CLINIC_OR_DEPARTMENT_OTHER)

## 2024-01-28 ENCOUNTER — Telehealth (INDEPENDENT_AMBULATORY_CARE_PROVIDER_SITE_OTHER): Admitting: Acute Care

## 2024-01-28 ENCOUNTER — Encounter: Payer: Self-pay | Admitting: Acute Care

## 2024-01-28 DIAGNOSIS — I281 Aneurysm of pulmonary artery: Secondary | ICD-10-CM | POA: Diagnosis not present

## 2024-01-28 DIAGNOSIS — R0609 Other forms of dyspnea: Secondary | ICD-10-CM

## 2024-01-28 NOTE — Patient Instructions (Signed)
 It is good to see you today. 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 Plan will be for your annual scan in May 2026. At this time I will have you follow-up with Dr. Kara as your new medical provider in the office, as Dr. Brenna is no longer with the practice. You will review the May 2026 CTA results with him. Please continue to work with healthy weight and wellness for continued weight loss and increased physical fitness. We are very proud of you for losing 20 pounds over the last 10 months. That is really great Please call if you need us  before May 2026. We are here if you need us  Please contact office for sooner follow up if symptoms do not improve or worsen or seek emergency care

## 2024-01-28 NOTE — Progress Notes (Addendum)
 Virtual Visit via Video Note  I connected with [name] on 01/28/24 at  9:30 AM EDT by a video enabled telemedicine application and verified that I am speaking with the correct person using two identifiers.  Location: Patient:  At home Provider: [address], [place], [place], [address]    I discussed the limitations of evaluation and management by telemedicine and the availability of in person appointments. The patient expressed understanding and agreed to proceed.  Synopsis [name] is a 68 y.o. female with past medical history of OSA on CPAP, hypertension, hyper bulimia. Referred for evaluation of dyspnea 08/2019 to Dr. Brenna.She does have a past medical history of Covid 06/2019 with Mild to moderate symptoms and persistant dyspnea afterwards.  Cardiac workup with grade 2 diastolic dysfunction. Prior pulmonary function tests were completed with a normal ratio, impaired spirometry, appears to have a prism  pattern likely related to body habitus and BMI of 32. She is on a PPI for reflux. CT with small pulmonary nodules. 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 .Follow up CTA was ordered to evaluate lung nodules and pulmonary artery aneurysm. Dr. Brenna suspects her DOE is related to her in ability to maintain  her endurance and physical activity level. He spoke with her about the utility of cardiopulmonary exercise testing.  She declined  this in 08/2020.    History of Present Illness: Pt. 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 This is  stable compared to a previous CT scan on most recent imaging done Dec 05, 2023, that measured 4.5 cm. Previous right heart catheterization showed no evidence of pulmonary hypertension, and there was no evidence of pulmonary vein thrombosis or stenosis. She has a normal variant left superior vena cava. She does have a history of supraventricular  tachycardia (SVT) and underwent ablation in 2017. Since her ablation in 2019, she has experienced persistent shortness of breath. No palpitations or other symptoms related to SVT. An echocardiogram on January 29, 2023, showed normal pump function with mild aortic valve regurgitation, and her right ventricle is normal.  She continues to have dyspnea on exertion since her ablation in 2019. BMI at her last cardiology visit on 12/24/2023 was 31.4.She did go to healthy weight and wellness after referral last August. She has lost 20 pounds over the last 10 months. She has changed her diet. She is able to exercise more and she is in better shape. She is continuing to work on better life style and weight loss as well as exercising more.  Her mother passed away last month, so she has had significant stress. She is working on re-establishing a more stable routine.  She is followed closely by cardiology, Dr. Oneil Parchment. BP is well controlled. She is still going to healthy weight and wellness.   We will do an annual CTA to continue to monitor her Pulmonary Artery aneurysm. She needs to establish with Dr. Kara, so I will have her follow up with him after her 11/2024 scan, in a 30 minute slot to establish care.    Observations/Objective: CTA chest 12/05/2023 Cardiovascular: The main pulmonary artery is markedly dilated measuring 4.5 cm. There is no evidence of filling defect within the pulmonary arterial circulation to the segmental level. No thoracic aneurysm. Two vessel aortic arch. Coronary artery atherosclerotic changes are favored in the left coronary territory.   Mediastinum/Nodes: Hiatal hernia. No evidence of mediastinal  lymphadenopathy. Duplicated superior vena cava. Unchanged right thyroid  nodule.   Lungs/Pleura: Unchanged small right middle lobe pulmonary nodule measuring approximately 4 mm on image 99 of series 4. No pleural effusion or pneumothorax.   Upper Abdomen: Nothing significant.    Musculoskeletal: No chest wall abnormality. No acute or significant osseous findings.   Review of the MIP images confirms the above findings.   IMPRESSION: 1. Dilated main pulmonary artery which can be observed in the setting pulmonary arterial hypertension and similar when compared to Nov 28, 2022. 2. No evidence of pulmonary embolus the segmental level.       Cardiac Cath 05/22/2021 Fick Cardiac Output 4.72 L/min  Fick Cardiac Output Index 2.44 (L/min)/BSA  RA A Wave 7 mmHg  RA V Wave 2 mmHg  RA Mean 3 mmHg  RV Systolic Pressure 32 mmHg  RV Diastolic Pressure 0 mmHg  RV EDP 3 mmHg  PA Systolic Pressure 24 mmHg  PA Diastolic Pressure 3 mmHg  PA Mean 13 mmHg  PW A Wave 11 mmHg  PW V Wave 10 mmHg  PW Mean 9 mmHg  QP/QS 1  TPVR Index 5.33 HRUI      Stable fusiform dilatation of the main pulmonary artery segment measuring up to 4.9 cm in maximum perpendicular diameter. This measurement is stable when making independent measurements at the same level on all prior studies dating back to 2022. No evidence of acute or chronic pulmonary embolism. There is no definitive follow-up algorithm for pulmonary artery aneurysmal disease. Appropriate follow-up likely would be annual CTA or MRA similar to follow-up for a similar-sized thoracic aortic aneurysm. 2. Normal variant left SVC. 3. Known roughly 3 cm right thyroid  nodule visualized by CT. This has been sampled by fine needle aspiration in the past and follow-up by ultrasound. See prior reports and cytology. 4. Stable large hiatal hernia.   12/11/2017 chest x-ray: Small hiatal hernia bilateral clear lungs no infiltrate. The patient's images have been independently reviewed by me.     Pulmonary Functions Testing Results:     Latest Ref Rng & Units 11/05/2019    2:00 PM  PFT Results  FVC-Pre L 2.29   FVC-Predicted Pre % 67   FVC-Post L 2.33   FVC-Predicted Post % 69   Pre FEV1/FVC % % 80   Post FEV1/FCV % % 82    FEV1-Pre L 1.83   FEV1-Predicted Pre % 70   FEV1-Post L 1.91   DLCO uncorrected ml/min/mmHg 21.75   DLCO UNC% % 104   DLCO corrected ml/min/mmHg 21.75   DLCO COR %Predicted % 104   DLVA Predicted % 129   TLC L 4.15   TLC % Predicted % 79   RV % Predicted % 80       Echocardiogram:  08/02/2019: Echocardiogram Normal LVEF grade 2 diastolic dysfunction   Myocardial perfusion Lexi scan: 08/16/2019: Normal myocardial perfusion results reviewed.    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 Plan will be for your annual scan in May 2026. At this time I will have you follow-up with Dr. Kara as your new medical provider in the office, as Dr. Brenna is no longer with the practice. You will review the May 2026 CTA results with him. Please continue to work with healthy weight and wellness for continued weight loss and increased  physical fitness. We are very proud of you for losing 20 pounds over the last 10 months. That is really great Continue to follow up with cardiology as you have been doing. Please call if you need us  before May 2026. We are here if you need us  Please contact office for sooner follow up if symptoms do not improve or worsen or seek emergency care    Follow Up Instructions: Continue to follow up with cardiology as you have been doing. Please call if you need us  before May 2026. We are here if you need us  Please contact office for sooner follow up if symptoms do not improve or worsen or seek emergency care      I discussed the assessment and treatment plan with the patient. The patient was provided an opportunity to ask questions and all were answered. The patient agreed with the plan and demonstrated an understanding of the instructions.   The patient was advised to call back or seek an  in-person evaluation if the symptoms worsen or if the condition fails to improve as anticipated.  I provided 20 minutes of video -face-to-face  time during this encounter.   Claudia JULIANNA Lites, NP

## 2024-02-18 ENCOUNTER — Encounter (INDEPENDENT_AMBULATORY_CARE_PROVIDER_SITE_OTHER): Payer: Self-pay | Admitting: Internal Medicine

## 2024-02-18 ENCOUNTER — Ambulatory Visit (INDEPENDENT_AMBULATORY_CARE_PROVIDER_SITE_OTHER): Admitting: Internal Medicine

## 2024-02-18 VITALS — BP 119/73 | HR 91 | Temp 97.9°F | Ht 64.0 in | Wt 177.0 lb

## 2024-02-18 DIAGNOSIS — E66811 Obesity, class 1: Secondary | ICD-10-CM

## 2024-02-18 DIAGNOSIS — I110 Hypertensive heart disease with heart failure: Secondary | ICD-10-CM

## 2024-02-18 DIAGNOSIS — I5032 Chronic diastolic (congestive) heart failure: Secondary | ICD-10-CM | POA: Diagnosis not present

## 2024-02-18 DIAGNOSIS — Z6830 Body mass index (BMI) 30.0-30.9, adult: Secondary | ICD-10-CM

## 2024-02-18 DIAGNOSIS — G4733 Obstructive sleep apnea (adult) (pediatric): Secondary | ICD-10-CM | POA: Diagnosis not present

## 2024-02-18 DIAGNOSIS — I10 Essential (primary) hypertension: Secondary | ICD-10-CM

## 2024-02-18 DIAGNOSIS — K760 Fatty (change of) liver, not elsewhere classified: Secondary | ICD-10-CM | POA: Diagnosis not present

## 2024-02-18 NOTE — Assessment & Plan Note (Signed)
 Blood pressure at goal for age and risk category.  On spironolactone  without adverse effects.  Most recent renal parameters reviewed which showed normal electrolytes and kidney function.  Continue with weight loss therapy. Losing 10% may improve blood pressure control. Monitor for symptoms of orthostasis while losing weight. Continue current regimen and home monitoring for a goal blood pressure of 120/80.

## 2024-02-18 NOTE — Assessment & Plan Note (Signed)
 She presents for medical weight management, having lost two pounds since the last visit, totaling a fifteen-pound weight loss. She engages in regular exercise, including water aerobics, and has made dietary changes, such as avoiding mozzarella due to lactose intolerance. Discussed the potential benefits of GLP-1 agonists for weight loss and associated conditions, including hypertension, diastolic heart failure, coronary artery disease, sleep apnea, fatty liver disease, and insulin  resistance. GLP-1 agonists can aid in weight loss and improve these conditions, with additional benefits for cardiovascular risk reduction, fatty liver improvement, and potential applications in other diseases. Long-term use (at least 16 months) is necessary to maintain weight loss and associated health benefits. Common side effect is constipation, not diarrhea. Emphasized the importance of lifestyle changes as the foundation for weight management. - Continue current exercise regimen and dietary modifications. - Consider GLP-1 agonist therapy for weight management and associated conditions. - Increase physical activity to enhance calorie expenditure. - Use meal replacements for one meal daily to aid weight loss.

## 2024-02-18 NOTE — Progress Notes (Signed)
 Office: [phone]  /  Fax: [phone]  Weight Summary and Body Composition Analysis (BIA)  Vitals Temp: 97.9 F (36.6 C) BP: 119/73 Pulse Rate: 91 SpO2: 97 %   Anthropometric Measurements Height: 5' 4 (1.626 m) Weight: 177 lb (80.3 kg) BMI (Calculated): 30.37 Weight at Last Visit: 179 lb Weight Lost Since Last Visit: 2 lb Weight Gained Since Last Visit: 0 lb Starting Weight: 192 lb Total Weight Loss (lbs): 15 lb (6.804 kg) Peak Weight: 202 lb   Body Composition  Body Fat %: 40.7 % Fat Mass (lbs): 72.4 lbs Muscle Mass (lbs): 100 lbs Total Body Water (lbs): 67.6 lbs Visceral Fat Rating : 11    RMR: 1469  Today's Visit #: 15  Starting Date: 03/13/23   Subjective   Chief Complaint: Obesity  Interval History Discussed the use of AI scribe software for clinical note transcription with the patient, who gave verbal consent to proceed.  History of Present Illness   [name] is a 68 year old female with hypertension, diastolic heart failure, coronary artery disease, sleep apnea, and fatty liver disease who presents for medical weight management.  She has achieved a total weight loss of fifteen pounds, including a two-pound loss since her last visit. Her exercise regimen includes deep water aerobics for sixty minutes, five days a week. She has increased her water intake to over 100 ounces daily and uses a protein shake as a meal replacement for lunch.  She experiences chronic loose stools and relies on Imodium as needed for management. She has undergone a colonoscopy with biopsies in the past but has not yet established care with a new gastroenterologist since her previous doctor retired. She avoids mozzarella due to lactose intolerance, which she believes has alleviated her bowel issues. Additionally, she avoids artificial sweeteners and sugar-free products due to their adverse effects on her digestion.  Her family history is notable for longevity, with  no known history of aneurysms or unexpected deaths at a young age.         Challenges affecting patient progress: none.    Pharmacotherapy for weight management: She is currently taking no anti-obesity medication and patient has declined pharmacotherapy in past.   Assessment and Plan   Treatment Plan For Obesity:  Recommended Dietary Goals  Vylette is currently in the action stage of change. As such, her goal is to continue weight management plan. She has agreed to: incorporate 1-2 meal replacements a day for convenience  and continue current plan  Behavioral Health and Counseling  We discussed the following behavioral modification strategies today: continue to work on maintaining a reduced calorie state, getting the recommended amount of protein, incorporating whole foods, making healthy choices, staying well hydrated and practicing mindfulness when eating..  Additional education and resources provided today: None  Recommended Physical Activity Goals  Karley has been advised to work up to 150 minutes of moderate intensity aerobic activity a week and strengthening exercises 2-3 times per week for cardiovascular health, weight loss maintenance and preservation of muscle mass.   She has agreed to :  Think about enjoyable ways to increase daily physical activity and overcoming barriers to exercise and Increase physical activity in their day and reduce sedentary time (increase NEAT).  Medical Interventions and Pharmacotherapy  We discussed various medication options to help Regla with her weight loss efforts and we both agreed to : Continue with current nutritional and behavioral strategies, Has declined pharmacotherapy, and we discussed the benefits associated with GLP-1 treatment  Associated Conditions Impacted by Obesity Treatment  Assessment & Plan Primary hypertension Blood pressure at goal for age and risk category.  On spironolactone  without adverse effects.  Most recent  renal parameters reviewed which showed normal electrolytes and kidney function.  Continue with weight loss therapy. Losing 10% may improve blood pressure control. Monitor for symptoms of orthostasis while losing weight. Continue current regimen and home monitoring for a goal blood pressure of 120/80.   Chronic diastolic (congestive) heart failure (HCC)  Metabolic dysfunction-associated steatotic liver disease (MASLD) On CT scan in 2018 that showed diffuse hepatic steatosis.  Most recent labs showed normal liver enzymes and platelet count.  Fibrosis 4 Score = 1.25  Fib-4 interpretation is not validated for people under 35 or over 61 years of age. However, scores under 2.0 are generally considered low risk.   Continue with medically supervised weight management program.  Continue to work on reducing saturated fats in diet as well as simple and added sugars.  Losing 15% of body weight may improve condition also treatment with GLP-1.  She has declined antiobesity medications. OSA on CPAP She reports good compliance with CPAP.  Losing 15% of body weight may reduce AHI continue with current weight management strategy   Class 1 obesity with serious comorbidity and body mass index (BMI) of 30.0 to 30.9 in adult, unspecified obesity type She presents for medical weight management, having lost two pounds since the last visit, totaling a fifteen-pound weight loss. She engages in regular exercise, including water aerobics, and has made dietary changes, such as avoiding mozzarella due to lactose intolerance. Discussed the potential benefits of GLP-1 agonists for weight loss and associated conditions, including hypertension, diastolic heart failure, coronary artery disease, sleep apnea, fatty liver disease, and insulin  resistance. GLP-1 agonists can aid in weight loss and improve these conditions, with additional benefits for cardiovascular risk reduction, fatty liver improvement, and potential applications in  other diseases. Long-term use (at least 16 months) is necessary to maintain weight loss and associated health benefits. Common side effect is constipation, not diarrhea. Emphasized the importance of lifestyle changes as the foundation for weight management. - Continue current exercise regimen and dietary modifications. - Consider GLP-1 agonist therapy for weight management and associated conditions. - Increase physical activity to enhance calorie expenditure. - Use meal replacements for one meal daily to aid weight loss.     Chronic Loose Stools She reports chronic loose stools with unpredictable patterns. Previously evaluated by a gastroenterologist, but not currently established with a new provider. Discussed the potential need for colon biopsies to rule out microscopic colitis. She uses Imodium as needed for symptom control. Emphasized the importance of reestablishing care with a gastroenterologist to explore further diagnostic options. - Reestablish care with a gastroenterologist for further evaluation. - Continue using Imodium as needed for symptom control. - Consider colon biopsies to rule out microscopic colitis if not previously done.  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 The condition is rare, and the growth rate is important for management. A follow-up CT scan is planned for May next year to monitor growth. No family history of aneurysms or sudden death at a young age.  - Monitor with follow-up CT scan in May next year. - Educate on symptoms of potential complications, such as sudden chest pain or difficulty breathing.        Objective   Physical Exam:  Blood pressure 119/73, pulse 91, temperature 97.9 F (36.6 C), height  5' 4 (1.626 m), weight 177 lb (80.3 kg), SpO2 97%. Body mass index is 30.38 kg/m.  General: She is overweight, cooperative, alert, well developed, and in no acute distress. PSYCH: Has  normal mood, affect and thought process.   HEENT: EOMI, sclerae are anicteric. Lungs: Normal breathing effort, no conversational dyspnea. Extremities: No edema.  Neurologic: No gross sensory or motor deficits. No tremors or fasciculations noted.    Diagnostic Data Reviewed:  BMET    Component Value Date/Time   NA 141 08/20/2023 1305   K 5.1 08/20/2023 1305   CL 103 08/20/2023 1305   CO2 21 08/20/2023 1305   GLUCOSE 86 08/20/2023 1305   GLUCOSE 152 (H) 01/02/2023 1200   BUN 24 08/20/2023 1305   CREATININE 0.84 08/20/2023 1305   CREATININE 0.84 12/27/2015 0833   CALCIUM  9.9 08/20/2023 1305   GFRNONAA >60 01/02/2023 1200   GFRAA 38 (L) 09/29/2015 1553   Lab Results  Component Value Date   HGBA1C 5.3 03/13/2023   Lab Results  Component Value Date   INSULIN  22.3 08/20/2023   INSULIN  34.0 (H) 03/13/2023   Lab Results  Component Value Date   TSH 0.901 03/13/2023   CBC    Component Value Date/Time   WBC 8.3 11/19/2023 1559   WBC 8.6 01/02/2023 1200   RBC 5.65 (H) 11/19/2023 1559   RBC 5.63 (H) 01/02/2023 1200   HGB 15.2 11/19/2023 1559   HCT 48.3 (H) 11/19/2023 1559   PLT 212 11/19/2023 1559   MCV 86 11/19/2023 1559   MCH 26.9 11/19/2023 1559   MCH 27.2 01/02/2023 1200   MCHC 31.5 11/19/2023 1559   MCHC 30.9 01/02/2023 1200   RDW 13.7 11/19/2023 1559   Iron Studies    Component Value Date/Time   IRON 77 03/13/2023 1109   TIBC 343 03/13/2023 1109   FERRITIN 59 03/13/2023 1109   IRONPCTSAT 22 03/13/2023 1109   Lipid Panel     Component Value Date/Time   CHOL 129 08/20/2023 1305   TRIG 62 08/20/2023 1305   HDL 56 08/20/2023 1305   CHOLHDL 2.8 03/13/2023 1109   LDLCALC 60 08/20/2023 1305   Hepatic Function Panel     Component Value Date/Time   PROT 7.2 08/20/2023 1305   ALBUMIN 4.6 08/20/2023 1305   AST 18 08/20/2023 1305   ALT 20 08/20/2023 1305   ALKPHOS 49 08/20/2023 1305   BILITOT 0.4 08/20/2023 1305   BILIDIR 0.15 11/14/2021 0912       Component Value Date/Time   TSH 0.901 03/13/2023 1109   TSH 2.070 10/30/2020 1417   Nutritional Lab Results  Component Value Date   VD25OH 69.1 08/20/2023   VD25OH 28.6 (L) 03/13/2023    Medications: Outpatient Encounter Medications as of 02/18/2024  Medication Sig   allopurinol (ZYLOPRIM) 100 MG tablet Take 100 mg by mouth daily.   aspirin  EC 81 MG tablet Take 1 tablet (81 mg total) by mouth daily. SWALLOW WHOLE.   cetirizine (ZYRTEC) 10 MG tablet Take 10 mg by mouth in the morning.   clobetasol (OLUX) 0.05 % topical foam Apply 1 Application topically 2 (two) times daily.   Colchicine (COLCRYS PO) Take 0.6 mg by mouth. Was twice daily, now once daily, then taper   ezetimibe  (ZETIA ) 10 MG tablet TAKE 1 TABLET BY MOUTH EVERY DAY   fluticasone  (FLONASE ) 50 MCG/ACT nasal spray Place 2 sprays into both nostrils daily.   pravastatin  (PRAVACHOL ) 40 MG tablet TAKE 1 TABLET BY MOUTH EVERY DAY IN  THE EVENING   spironolactone  (ALDACTONE ) 50 MG tablet TAKE 1 TABLET BY MOUTH EVERY DAY   venlafaxine XR (EFFEXOR-XR) 75 MG 24 hr capsule Take 75 mg by mouth daily.   No facility-administered encounter medications on file as of 02/18/2024.     Follow-Up   Return in about 4 weeks (around 03/17/2024) for For Weight Mangement with Dr. Francyne.SABRA She was informed of the importance of frequent follow up visits to maximize her success with intensive lifestyle modifications for her multiple health conditions.  Attestation Statement   Reviewed by clinician on day of visit: allergies, medications, problem list, medical history, surgical history, family history, social history, and previous encounter notes.     Lucas Francyne, MD

## 2024-02-18 NOTE — Assessment & Plan Note (Signed)
 On CT scan in 2018 that showed diffuse hepatic steatosis.  Most recent labs showed normal liver enzymes and platelet count.  Fibrosis 4 Score = 1.25  Fib-4 interpretation is not validated for people under 35 or over 67 years of age. However, scores under 2.0 are generally considered low risk.   Continue with medically supervised weight management program.  Continue to work on reducing saturated fats in diet as well as simple and added sugars.  Losing 15% of body weight may improve condition also treatment with GLP-1.  She has declined antiobesity medications.

## 2024-02-18 NOTE — Assessment & Plan Note (Signed)
 She reports good compliance with CPAP.  Losing 15% of body weight may reduce AHI continue with current weight management strategy

## 2024-03-01 ENCOUNTER — Encounter: Payer: Self-pay | Admitting: Pediatrics

## 2024-03-01 NOTE — Progress Notes (Signed)
 [name] Initial Consultation   Referring Provider Janey Santos, MD [address] [place],  [place] [address]  Primary Care Provider Janey Santos, MD  Patient Profile: [name] is a 68 y.o. female who is seen in consultation in the [facility] at the request of Dr. Janey for evaluation and management of the problem(s) noted below.  Problem List: IBS w/ diarrhea and constipation GERD Hiatal hernia and paraesophageal hernia Schatzki's ring Suspected esophageal motility disorder on barium esophagram 2022 History of appendiceal mass 2018 - benign pathology Personal history of colon tubular adenomas MASLD   History of Present Illness   [name] is a 68 y.o. female with a history of pulmonary artery aneurysm, PSVT, HTN, diastolic heart failure, CAD, HLD, OSA on CPAP, Guillain-Barr syndrome, gout who presents for evaluation and management of IBS with diarrhea and constipation, GERD, fatty liver/MASLD   Discussed the use of AI scribe software for clinical note transcription with the patient, who gave verbal consent to proceed.  History of Present Illness  IBS with diarrhea and constipation - Reports lifelong issues with altered bowel habits - States she was previously given a diagnosis of IBS and told it was due to anxiety/stress and all in her head  - Reports frequent bowel movements, two to three times daily - Stools are soft, sometimes bulky - Episodes of urgency and incontinence, occasionally requiring unconventional bathroom use - Imodium is ineffective during episodes - Symptoms predominantly diarrhea; no significant constipation unless medication-induced - No recent blood or mucus in stools - Past rectal bleeding associated with medication-induced constipation - No specific dietary triggers identified - Symptoms improved after eliminating mozzarella cheese, despite negative lactose intolerance tests - Feels that IBS  symptoms were best controlled when she was on Prozac in the remote past; stopped Prozac during pregnancy and did not see benefit once it was restarted later - No improvement with probiotics  - Previously seen at [facility] and reports having a workup for her IBS type symptoms but is unsure of the results - Does not think she has been tested for celiac disease - Advised to consume fiber in her diet - Colonoscopy 2018 - concerning for a new lesion at the appendiceal orifice along with several polyps. All were biopsied. Per the Pathology report, Mucin and polyp at the appendiceal orifice were noted to be negative for dysplasia and malignancy. There was evidence of tubular adenoma from the transverse colon polyp.  - Reports last colonoscopy was performed in 2022-results not available  Gastroesophageal reflux symptoms - Records indicate a previous history of GERD, hiatal hernia and Schatzki's ring - Reflux symptoms have improved with a 20-pound weight loss and dietary modifications - Not currently taking reflux medication  Hepatic imaging findings-suspected - MASLD - Imaging studies have shown a history of fatty liver - Rarely consumes alcohol - No history of blood transfusion, IVDU or tattoos - No family history of liver disease  - Follows a protein-based diet to address insulin  resistance - Metabolic parameters have improved with dietary changes - Fibrosis 4 Score = 1.51  History of colonic adenomas - [facility] colonoscopy report from 2018 documents a previous history of nonadvanced colonic adenoma - Colonoscopy 2018 showed a polypoid lesion at the appendiceal orifice as well as 2 sessile polyps in the transverse colon -path report not available - Underwent appendectomy for the appendiceal lesion with benign pathology per her report - Last colonoscopy performed in 2022-report not available   GI Review of Symptoms Significant  for diarrhea, fecal incontinence, constipation abdominal  discomfort. Otherwise negative.  General Review of Systems  Review of systems is significant for the pertinent positives and negatives as listed per the HPI.  Full ROS is otherwise negative.  Past Medical History   Past Medical History:  Diagnosis Date   ADD (attention deficit disorder)    Anxiety    Back pain    CHF (congestive heart failure) (HCC)    Colon polyps 2022   diminutive adenomas, Dr Lamar Buccini   Constipation    Daytime somnolence 04/06/2013   DDD (degenerative disc disease), lumbar    Depression    Depression    GERD (gastroesophageal reflux disease)    Hiatal hernia    Hypercholesteremia    Hypertension    IBS (irritable bowel syndrome)    Joint pain    OSA on CPAP 04/06/2013   Palpitations    Sleep apnea    Swelling of both lower extremities    Thyroid  condition    Urinary incontinence      Past Surgical History   Past Surgical History:  Procedure Laterality Date   ABLATION  09/29/2017   APPENDECTOMY     BACK SURGERY  04/06/2019   BREAST EXCISIONAL BIOPSY Left    1990s - benign   CATARACT EXTRACTION Bilateral    COLONOSCOPY     ELECTROPHYSIOLOGIC STUDY N/A 01/01/2016   Procedure: SVT Ablation;  Surgeon: Danelle LELON Birmingham, MD;  Location: [facility] INVASIVE CV LAB;  Service: Cardiovascular;  Laterality: N/A;   ESOPHAGOGASTRODUODENOSCOPY     HAND SURGERY Right    KNEE ARTHROPLASTY Left    NASAL SEPTUM SURGERY     RADICAL HYSTERECTOMY  2000   RIGHT HEART CATH N/A 05/22/2021   Procedure: RIGHT HEART CATH;  Surgeon: Swazil[place], Peter M, MD;  Location: [facility] INVASIVE CV LAB;  Service: Cardiovascular;  Laterality: N/A;   TONSILLECTOMY AND ADENOIDECTOMY       Allergies and Medications   Allergies  Allergen Reactions   Other Itching    Antiseptic Solution   Rosuvastatin  Other (See Comments)    Current Meds  Medication Sig   allopurinol (ZYLOPRIM) 100 MG tablet Take 100 mg by mouth daily.   aspirin  EC 81 MG tablet Take 1 tablet (81 mg total) by mouth  daily. SWALLOW WHOLE.   cetirizine (ZYRTEC) 10 MG tablet Take 10 mg by mouth in the morning.   clobetasol (OLUX) 0.05 % topical foam Apply 1 Application topically 2 (two) times daily.   Colchicine (COLCRYS PO) Take 0.6 mg by mouth. Was twice daily, now once daily, then taper   dicyclomine  (BENTYL ) 10 MG capsule Take 1 capsule (10 mg total) by mouth 4 (four) times daily -  before meals and at bedtime.   ezetimibe  (ZETIA ) 10 MG tablet TAKE 1 TABLET BY MOUTH EVERY DAY   fluticasone  (FLONASE ) 50 MCG/ACT nasal spray Place 2 sprays into both nostrils daily.   spironolactone  (ALDACTONE ) 50 MG tablet TAKE 1 TABLET BY MOUTH EVERY DAY   venlafaxine XR (EFFEXOR-XR) 75 MG 24 hr capsule Take 75 mg by mouth daily.   [DISCONTINUED] pravastatin  (PRAVACHOL ) 40 MG tablet TAKE 1 TABLET BY MOUTH EVERY DAY IN THE EVENING     Family History   Family History  Problem Relation Age of Onset   Heart disease Mother    Hypertension Mother    Diabetes Mother    Hyperlipidemia Mother    Heart disease Father    Hypertension Father    Stroke Father  Hyperlipidemia Father    Depression Father    Anxiety disorder Father    Diabetes Maternal Grandmother    Sleep apnea Neg Hx    BRCA 1/2 Neg Hx    Breast cancer Neg Hx    Neuropathy Neg Hx      Social History   Social History   Tobacco Use   Smoking status: Never   Smokeless tobacco: Never  Vaping Use   Vaping status: Never Used  Substance Use Topics   Alcohol use: Not Currently    Alcohol/week: 0.0 standard drinks of alcohol   Drug use: No   Kajal reports that she has never smoked. She has never used smokeless tobacco. She reports that she does not currently use alcohol. She reports that she does not use drugs.  Vital Signs and Physical Examination   Vitals:   03/02/24 1042  BP: 130/70  Pulse: 78   Body mass index is 31.41 kg/m. Weight: 183 lb (83 kg)  General: Well developed, well nourished, no acute distress Head: Normocephalic and  atraumatic Eyes: Sclerae anicteric, EOMI Lungs: Clear throughout to auscultation Heart: Regular rate and rhythm; No murmurs, rubs or bruits Abdomen: Soft, non tender and non distended. No masses, hepatosplenomegaly or hernias noted. Normal Bowel sounds Rectal: Deferred Musculoskeletal: Symmetrical with no gross deformities   Review of Data  The following data was reviewed at the time of this encounter:  Laboratory Studies      Latest Ref Rng & Units 03/02/2024   12:12 PM 11/19/2023    3:59 PM 08/20/2023    1:05 PM  CBC  WBC 4.0 - 10.5 K/uL 7.9  8.3  7.5   Hemoglobin 12.0 - 15.0 g/dL 84.2  84.7  83.8   Hematocrit 36.0 - 46.0 % 50.0  48.3  51.7   Platelets 150.0 - 400.0 K/uL 208.0  212  237     No results found for: LIPASE    Latest Ref Rng & Units 03/02/2024   12:12 PM 08/20/2023    1:05 PM 03/13/2023   11:09 AM  CMP  Glucose 70 - 99 mg/dL 90  86  88   BUN 6 - 23 mg/dL 37  24  17   Creatinine 0.40 - 1.20 mg/dL 9.06  9.15  9.01   Sodium 135 - 145 mEq/L 138  141  141   Potassium 3.5 - 5.1 mEq/L 4.3  5.1  4.5   Chloride 96 - 112 mEq/L 104  103  104   CO2 19 - 32 mEq/L 24  21  21    Calcium  8.4 - 10.5 mg/dL 89.7  9.9  89.9   Total Protein 6.0 - 8.3 g/dL 7.8  7.2  7.4   Total Bilirubin 0.2 - 1.2 mg/dL 0.5  0.4  0.6   Alkaline Phos 39 - 117 U/L 43  49  52   AST 0 - 37 U/L 19  18  24    ALT 0 - 35 U/L 16  20  20       Imaging Studies  Barium esophagram 02/06/2021 1. Esophageal motility disorder. 2. Small sliding-type hiatal hernia and moderate to large adjacent paraesophageal hernia. 3. Lower esophageal mucosal B ring. The 13 mm barium pill passed without difficulty. 4. Episodes of spontaneous GE reflux.  GI Procedures and Studies   Colonoscopy 2022 - ? Normal - no report  Colonoscopy 2018 - concerning for a new lesion at the appendiceal orifice along with several polyps. All were biopsied. Per the Pathology report,  Mucin and polyp at the appendiceal orifice were noted to  be negative for dysplasia and malignancy. There was evidence of tubular adenoma from the transverse colon polyp.    Clinical Impression  It is my clinical impression that Ms. Georgiades is a 68 y.o. female with;  IBS w/ diarrhea and constipation GERD Hiatal hernia and paraesophageal hernia Schatzki's ring Suspected esophageal motility disorder on barium esophagram 2022 History of appendiceal mass 2018 - benign pathology Personal history of colon tubular adenomas MASLD  Sani presents to the office today primarily for evaluation and management of IBS with diarrhea and constipation.  She relates a longstanding history throughout most of her life of irregular bowel habits.  At today's visit she is particularly troubled by diarrhea, increased bowel frequency and episodes of incontinence.  She also experiences abdominal cramping.  She is not entirely sure of her previous workup that has yielded a diagnosis of IBS.  She has had colonoscopies but I do not see documentation of random biopsies to rule out microscopic colitis.  We discussed that it may be appropriate to perform evaluation for other conditions that may mimic IBS such as celiac disease, SIBO, thyroid  dysfunction, inflammatory GI conditions, alpha gal syndrome, food intolerance, EPI.  Will also request her prior records from [facility] gastroenterology.  Lajoy has not utilized antispasmodics in the past and we discussed that that may be an option for managing her fecal urgency.  We also discussed whether or not her symptoms could reflect a component of constipation with overflow encopresis and I suggested obtaining a KUB today to evaluate stool burden.  Shanecia's previous GI history is noteworthy for GERD, hiatal hernia and Schatzki's ring.  She reports that her GERD symptoms significantly improved after 20 pound weight loss and she is no longer on antacids.  Her previous imaging has disclosed evidence of fatty liver and suspect she may have MASLD.   Liver enzymes have been normal.  She does not have risk factors for other forms of chronic liver disease.  At this juncture I do not feel additional laboratory studies are necessarily warranted for chronic hepatitides.Fib 4 calculated today does not suggest she is at risk of advanced fibrosis.  Her previous colonoscopies have been documented to have shown nonadvanced tubular adenomas as well as an appendiceal polypoid mass in 2018 that was benign on pathology after appendectomy.  Her records suggest her last colonoscopy was performed in 2022, however, I cannot find this report.  Will request additional reports from [facility] gastroenterology.  Plan  Labs today: CBC, CMP, thyroid  profile, ESR, CRP, vitamin B12 level, folate, celiac panel, INR, PTT, alpha gal panel, GGT KUB ordered today to evaluate stool burden and rule out constipation with encopresis Trial of Bentyl  10 mg p.o. 3 times daily before meals to alleviate fecal urgency If KUB does not show evidence of constipation May use Imodium as needed to control bowels Consider stool testing for fecal elastase in the future Consider breath testing for SIBO in the future High-protein, low-fat diet and maintaining a healthy weight for management of MASLD Diet and lifestyle modification for GERD Obtain [facility] records to determine timing of next colonoscopy  Planned Follow Up 3  months  The patient or caregiver verbalized understanding of the material covered, with no barriers to understanding. All questions were answered. Patient or caregiver is agreeable with the plan outlined above.    It was a pleasure to see Alaney.  If you have any questions or concerns regarding this evaluation, do  not hesitate to contact me.  Inocente Hausen, MD [facility]

## 2024-03-02 ENCOUNTER — Ambulatory Visit
Admission: RE | Admit: 2024-03-02 | Discharge: 2024-03-02 | Disposition: A | Discharge status: Home / Self Care | Source: Ambulatory Visit | Attending: Pediatrics | Admitting: Pediatrics

## 2024-03-02 ENCOUNTER — Encounter: Payer: Self-pay | Admitting: Pediatrics

## 2024-03-02 ENCOUNTER — Ambulatory Visit (INDEPENDENT_AMBULATORY_CARE_PROVIDER_SITE_OTHER): Admitting: Pediatrics

## 2024-03-02 ENCOUNTER — Other Ambulatory Visit (INDEPENDENT_AMBULATORY_CARE_PROVIDER_SITE_OTHER)

## 2024-03-02 ENCOUNTER — Other Ambulatory Visit (HOSPITAL_COMMUNITY): Payer: Self-pay

## 2024-03-02 ENCOUNTER — Telehealth: Payer: Self-pay

## 2024-03-02 ENCOUNTER — Other Ambulatory Visit: Payer: Self-pay | Admitting: Cardiology

## 2024-03-02 VITALS — BP 130/70 | HR 78 | Ht 64.0 in | Wt 183.0 lb

## 2024-03-02 DIAGNOSIS — Z860100 Personal history of colon polyps, unspecified: Secondary | ICD-10-CM

## 2024-03-02 DIAGNOSIS — K222 Esophageal obstruction: Secondary | ICD-10-CM

## 2024-03-02 DIAGNOSIS — R931 Abnormal findings on diagnostic imaging of heart and coronary circulation: Secondary | ICD-10-CM

## 2024-03-02 DIAGNOSIS — K5900 Constipation, unspecified: Secondary | ICD-10-CM | POA: Diagnosis not present

## 2024-03-02 DIAGNOSIS — K760 Fatty (change of) liver, not elsewhere classified: Secondary | ICD-10-CM | POA: Diagnosis not present

## 2024-03-02 DIAGNOSIS — K589 Irritable bowel syndrome without diarrhea: Secondary | ICD-10-CM

## 2024-03-02 DIAGNOSIS — K219 Gastro-esophageal reflux disease without esophagitis: Secondary | ICD-10-CM | POA: Diagnosis not present

## 2024-03-02 DIAGNOSIS — Z860101 Personal history of adenomatous and serrated colon polyps: Secondary | ICD-10-CM

## 2024-03-02 DIAGNOSIS — K582 Mixed irritable bowel syndrome: Secondary | ICD-10-CM

## 2024-03-02 DIAGNOSIS — E782 Mixed hyperlipidemia: Secondary | ICD-10-CM

## 2024-03-02 DIAGNOSIS — R799 Abnormal finding of blood chemistry, unspecified: Secondary | ICD-10-CM

## 2024-03-02 DIAGNOSIS — K449 Diaphragmatic hernia without obstruction or gangrene: Secondary | ICD-10-CM

## 2024-03-02 DIAGNOSIS — R933 Abnormal findings on diagnostic imaging of other parts of digestive tract: Secondary | ICD-10-CM

## 2024-03-02 LAB — PROTIME-INR
INR: 1.1 ratio — ABNORMAL HIGH (ref 0.8–1.0)
Prothrombin Time: 11.4 s (ref 9.6–13.1)

## 2024-03-02 LAB — COMPREHENSIVE METABOLIC PANEL WITH GFR
ALT: 16 U/L (ref 0–35)
AST: 19 U/L (ref 0–37)
Albumin: 4.7 g/dL (ref 3.5–5.2)
Alkaline Phosphatase: 43 U/L (ref 39–117)
BUN: 37 mg/dL — ABNORMAL HIGH (ref 6–23)
CO2: 24 meq/L (ref 19–32)
Calcium: 10.2 mg/dL (ref 8.4–10.5)
Chloride: 104 meq/L (ref 96–112)
Creatinine, Ser: 0.93 mg/dL (ref 0.40–1.20)
GFR: 64 mL/min (ref >60.00)
Glucose, Bld: 90 mg/dL (ref 70–99)
Potassium: 4.3 meq/L (ref 3.5–5.1)
Sodium: 138 meq/L (ref 135–145)
Total Bilirubin: 0.5 mg/dL (ref 0.2–1.2)
Total Protein: 7.8 g/dL (ref 6.0–8.3)

## 2024-03-02 LAB — TSH: TSH: 2.2 u[IU]/mL (ref 0.35–5.50)

## 2024-03-02 LAB — CBC WITH DIFFERENTIAL/PLATELET
Basophils Absolute: 0.1 K/uL (ref 0.0–0.1)
Basophils Relative: 1 % (ref 0.0–3.0)
Eosinophils Absolute: 0.1 K/uL (ref 0.0–0.7)
Eosinophils Relative: 1.8 % (ref 0.0–5.0)
HCT: 50 % — ABNORMAL HIGH (ref 36.0–46.0)
Hemoglobin: 15.7 g/dL — ABNORMAL HIGH (ref 12.0–15.0)
Lymphocytes Relative: 18.9 % (ref 12.0–46.0)
Lymphs Abs: 1.5 K/uL (ref 0.7–4.0)
MCHC: 31.4 g/dL (ref 30.0–36.0)
MCV: 84.2 fl (ref 78.0–100.0)
Monocytes Absolute: 0.5 K/uL (ref 0.1–1.0)
Monocytes Relative: 6.5 % (ref 3.0–12.0)
Neutro Abs: 5.6 K/uL (ref 1.4–7.7)
Neutrophils Relative %: 71.8 % (ref 43.0–77.0)
Platelets: 208 K/uL (ref 150.0–400.0)
RBC: 5.94 Mil/uL — ABNORMAL HIGH (ref 3.87–5.11)
RDW: 14.8 % (ref 11.5–15.5)
WBC: 7.9 K/uL (ref 4.0–10.5)

## 2024-03-02 LAB — B12 AND FOLATE PANEL
Folate: 18 ng/mL (ref >5.9)
Vitamin B-12: 437 pg/mL (ref 211–911)

## 2024-03-02 LAB — C-REACTIVE PROTEIN: CRP: <1 mg/dL (ref 0.5–20.0)

## 2024-03-02 LAB — SEDIMENTATION RATE: Sed Rate: 27 mm/h (ref 0–30)

## 2024-03-02 LAB — GAMMA GT: GGT: 14 U/L (ref 7–51)

## 2024-03-02 MED ORDER — DICYCLOMINE HCL 10 MG PO CAPS: 10.0000 mg | ORAL_CAPSULE | Freq: Three times a day (TID) | ORAL | 3 refills | Status: DC

## 2024-03-02 NOTE — Patient Instructions (Signed)
 You have been scheduled for an abdominal ultrasound at [facility] (1st floor of hospital) on 03/08/24 at 8:30 am . Please arrive 30 minutes prior to your appointment for registration. Make certain not to have anything to eat or drink 6 hours prior to your appointment. Should you need to reschedule your appointment, please contact radiology at [phone]. This test typically takes about 30 minutes to perform.  Your provider has requested that you go to the basement level for lab work before leaving today. Press B on the elevator. The lab is located at the first door on the left as you exit the elevator.  Your provider has requested that you have an abdominal x ray before leaving today. Please go to the basement floor to our Radiology department for the test.   We have sent the following medications to your pharmacy for you to pick up at your convenience: Dicyclomine    We will try to obtain records from [facility].   Follow-up on : 05/27/24 at 9:30 am with Dr.McGreal   _______________________________________________________  If your blood pressure at your visit was 140/90 or greater, please contact your primary care physician to follow up on this.  _______________________________________________________  If you are age 31 or older, your body mass index should be between 23-30. Your Body mass index is 31.41 kg/m. If this is out of the aforementioned range listed, please consider follow up with your Primary Care Provider.  If you are age 48 or younger, your body mass index should be between 19-25. Your Body mass index is 31.41 kg/m. If this is out of the aformentioned range listed, please consider follow up with your Primary Care Provider.   ________________________________________________________  The [facility] providers would like to encourage you to use MYCHART to communicate with providers for non-urgent requests or questions.  Due to long hold times on the  telephone, sending your provider a message by [facility] may be a faster and more efficient way to get a response.  Please allow 48 business hours for a response.  Please remember that this is for non-urgent requests.  _______________________________________________________  [facility] is using a team-based approach to care.  Your team is made up of your doctor and two to three APPS. Our APPS (Nurse Practitioners and Physician Assistants) work with your physician to ensure care continuity for you. They are fully qualified to address your health concerns and develop a treatment plan. They communicate directly with your gastroenterologist to care for you. Seeing the Advanced Practice Practitioners on your physician's team can help you by facilitating care more promptly, often allowing for earlier appointments, access to diagnostic testing, procedures, and other specialty referrals.   Due to recent changes in healthcare laws, you may see the results of your imaging and laboratory studies on MyChart before your provider has had a chance to review them.  We understand that in some cases there may be results that are confusing or concerning to you. Not all laboratory results come back in the same time frame and the provider may be waiting for multiple results in order to interpret others.  Please give us  48 hours in order for your provider to thoroughly review all the results before contacting the office for clarification of your results.   Thank you for choosing me and [facility].  Dr.Nancy McGreal

## 2024-03-02 NOTE — Telephone Encounter (Signed)
 Pharmacy Patient Advocate Encounter   Received notification from Patient Pharmacy that prior authorization for Dicyclomine  HCl 10MG  capsules is required/requested.   Insurance verification completed.   The patient is insured through [employer] [facility] Medicare.   Per test claim: PA required; PA submitted to above mentioned insurance via CoverMyMeds Key/confirmation #/[id number] Status is pending

## 2024-03-03 ENCOUNTER — Ambulatory Visit: Payer: Self-pay | Admitting: Pediatrics

## 2024-03-03 ENCOUNTER — Encounter: Payer: Self-pay | Admitting: Pediatrics

## 2024-03-03 DIAGNOSIS — Z91014 Allergy to mammalian meats: Secondary | ICD-10-CM

## 2024-03-03 NOTE — Telephone Encounter (Signed)
 Pharmacy Patient Advocate Encounter  Received notification from [employer] [facility] [employer] that Prior Authorization for Dicyclomine  HCl 10MG  capsules has been APPROVED from 07-30-2023 to 03-02-2025   PA #/Case ID/Reference #: [id number]

## 2024-03-08 ENCOUNTER — Ambulatory Visit (HOSPITAL_COMMUNITY)
Admission: RE | Admit: 2024-03-08 | Discharge: 2024-03-08 | Disposition: A | Discharge status: Home / Self Care | Source: Ambulatory Visit | Attending: Pediatrics | Admitting: Pediatrics

## 2024-03-08 DIAGNOSIS — K760 Fatty (change of) liver, not elsewhere classified: Secondary | ICD-10-CM | POA: Diagnosis present

## 2024-03-09 LAB — ALPHA-GAL PANEL
Allergen, Mutton, f88: 0.1 kU/L — ABNORMAL HIGH
Allergen, Pork, f26: <0.1 kU/L
Beef: 0.2 kU/L — ABNORMAL HIGH
CLASS: 0
GALACTOSE-ALPHA-1,3-GALACTOSE IGE*: <0.1 kU/L (ref <0.10)

## 2024-03-09 LAB — IGA: Immunoglobulin A: 385 mg/dL — ABNORMAL HIGH (ref 70–320)

## 2024-03-09 LAB — INTERPRETATION:

## 2024-03-09 LAB — TISSUE TRANSGLUTAMINASE, IGA: (tTG) Ab, IgA: <1 U/mL

## 2024-03-17 ENCOUNTER — Ambulatory Visit (INDEPENDENT_AMBULATORY_CARE_PROVIDER_SITE_OTHER): Admitting: Internal Medicine

## 2024-03-17 ENCOUNTER — Encounter (INDEPENDENT_AMBULATORY_CARE_PROVIDER_SITE_OTHER): Payer: Self-pay | Admitting: Internal Medicine

## 2024-03-17 VITALS — BP 134/86 | HR 80 | Temp 98.9°F | Ht 64.0 in | Wt 178.0 lb

## 2024-03-17 DIAGNOSIS — E66811 Obesity, class 1: Secondary | ICD-10-CM

## 2024-03-17 DIAGNOSIS — I10 Essential (primary) hypertension: Secondary | ICD-10-CM

## 2024-03-17 DIAGNOSIS — D751 Secondary polycythemia: Secondary | ICD-10-CM | POA: Diagnosis not present

## 2024-03-17 DIAGNOSIS — Z6830 Body mass index (BMI) 30.0-30.9, adult: Secondary | ICD-10-CM

## 2024-03-17 DIAGNOSIS — K760 Fatty (change of) liver, not elsewhere classified: Secondary | ICD-10-CM | POA: Diagnosis not present

## 2024-03-17 DIAGNOSIS — G4733 Obstructive sleep apnea (adult) (pediatric): Secondary | ICD-10-CM | POA: Diagnosis not present

## 2024-03-17 NOTE — Assessment & Plan Note (Signed)
 Blood pressure slightly above goal.  On spironolactone  without adverse effects.  Most recent renal parameters reviewed which showed normal electrolytes and kidney function.  Continue with weight loss therapy. Losing 10% may improve blood pressure control. Monitor for symptoms of orthostasis while losing weight. Continue current regimen and home monitoring for a goal blood pressure of 120/80.

## 2024-03-17 NOTE — Progress Notes (Signed)
 which  Office: [phone]  /  Fax: [phone]  Weight Summary and Body Composition Analysis (BIA)  Vitals Temp: 98.9 F (37.2 C) BP: 134/86 Pulse Rate: 80 SpO2: 97 %   Anthropometric Measurements Height: 5' 4 (1.626 m) Weight: 178 lb (80.7 kg) BMI (Calculated): 30.54 Weight at Last Visit: 177 lb Weight Lost Since Last Visit: 0 lb Weight Gained Since Last Visit: 1 lb Starting Weight: 192 lb Total Weight Loss (lbs): 14 lb (6.35 kg) Peak Weight: 202 lb   Body Composition  Body Fat %: 41.1 % Fat Mass (lbs): 73.2 lbs Muscle Mass (lbs): 99.4 lbs Total Body Water (lbs): 66.4 lbs Visceral Fat Rating : 11    RMR: 1469  Today's Visit #: 16  Starting Date: 03/13/23   Subjective   Chief Complaint: Obesity  Interval History Discussed the use of AI scribe software for clinical note transcription with the patient, who gave verbal consent to proceed.  History of Present Illness   [name] is a 68 year old female who presents for medical weight management.  She has gained one pound since her last visit, bringing her total weight loss to about fourteen pounds. She follows a twelve hundred calorie nutrition plan seventy percent of the time, ensuring adequate protein intake and hydration, and does not skip meals. She exercises three days a week for sixty minutes doing water aerobics.  An ultrasound on March 08, 2024, showed no focal liver lesions and a normal portal vein, but multiple gallstones with the largest measuring 2.2 cm. A previous CT scan in 2018 showed diffuse hepatic steatosis.  She experienced dizziness and fever after the ultrasound, with the fever reaching up to 103F. After the fever resolved, she developed congestion and phlegm. She did not take Paxlovid.   She experiences itching, particularly after sun exposure and water aerobics, but not specifically after hot showers.       Challenges affecting patient progress: Weight loss plateau.     Pharmacotherapy for weight management: She is currently taking patient has declined pharmacotherapy in past.   Assessment and Plan   Treatment Plan For Obesity:  Recommended Dietary Goals  [name] is currently in the action stage of change. As such, her goal is to continue weight management plan. She has agreed to: follow tailored, multi-day,whole foods, low-carbohydrate, high protein plan targeting 1000 calories and 90 grams of protein per day, follow the Category 1 plan - 1000 kcal per day, and incorporate 1-2 meal replacements a day for convenience .  She was provided with 2 plans 1 Mediterranean  Behavioral Health and Counseling  We discussed the following behavioral modification strategies today: continue to work on maintaining a reduced calorie state, getting the recommended amount of protein, incorporating whole foods, making healthy choices, staying well hydrated and practicing mindfulness when eating..  Additional education and resources provided today: None  Recommended Physical Activity Goals  [name] has been advised to work up to 150 minutes of moderate intensity aerobic activity a week and strengthening exercises 2-3 times per week for cardiovascular health, weight loss maintenance and preservation of muscle mass.   She has agreed to :  continue to gradually increase the amount and intensity of exercise routine  Medical Interventions and Pharmacotherapy  We discussed various medication options to help [name] with her weight loss efforts and we both agreed to : Has declined pharmacotherapy  Associated Conditions Impacted by Obesity Treatment  Assessment & Plan Polycythemia I reviewed overnight oxygen test which was somewhat limited but  did not show any significant nighttime hypoxemia.  Recent CBC again showed increased hematocrit and RBC mass she is also experiencing pruritus but not with hot showers..  As discussed we will check erythropoietin  and JAK2 to complete  evaluation.  If this is negative I recommend possibly repeating overnight oximetry as recent assessment was not adequate. Primary hypertension Blood pressure slightly above goal.  On spironolactone  without adverse effects.  Most recent renal parameters reviewed which showed normal electrolytes and kidney function.  Continue with weight loss therapy. Losing 10% may improve blood pressure control. Monitor for symptoms of orthostasis while losing weight. Continue current regimen and home monitoring for a goal blood pressure of 120/80.   OSA on CPAP She reports good compliance with CPAP.  Losing 15% of body weight may reduce AHI continue with current weight management strategy   Metabolic dysfunction-associated steatotic liver disease (MASLD) On CT scan in 2018 that showed diffuse hepatic steatosis.  Most recent labs showed normal liver enzymes and platelet count.  Recent ultrasound showed no hepatic steatosis.  Has declined treatment with GLP-1  Fibrosis 4 Score = 1.25   Continue with medically supervised weight management program.  Continue to work on reducing saturated fats in diet as well as simple and added sugars.  Losing 15% of body weight may improve condition she declined treatment with GLP-1. Class 1 obesity with serious comorbidity and body mass index (BMI) of 30.0 to 30.9 in adult, unspecified obesity type Weight management is ongoing with a recent weight gain of one pound, totaling a weight loss of fourteen pounds. Following a 1200 calorie nutrition plan 70% of the time. Engaging in water aerobics three times a week for 60 minutes. Not interested in GLP-1 medications. Discussed the challenges of maintaining weight loss with lifestyle changes alone, noting that 80% of people fail to maintain weight loss with lifestyle changes alone. Discussed the potential benefits of medications, which have an 88% response rate when combined with lifestyle changes. Emphasized the importance of maintaining  a calorie deficit and suggested using meal replacements to achieve a 1000 calorie daily intake. Encouraged increasing protein and fiber intake to promote satiety. - Provide a 7-day 1000 calorie meal plan with Mediterranean and standard American options. - Encourage use of meal replacements for one meal per day. - Suggest increasing exercise intensity or duration if unable to reduce calorie intake further.          Objective   Physical Exam:  Blood pressure 134/86, pulse 80, temperature 98.9 F (37.2 C), height 5' 4 (1.626 m), weight 178 lb (80.7 kg), SpO2 97%. Body mass index is 30.55 kg/m.  General: She is overweight, cooperative, alert, well developed, and in no acute distress. PSYCH: Has normal mood, affect and thought process.   HEENT: EOMI, sclerae are anicteric. Lungs: Normal breathing effort, no conversational dyspnea. Extremities: No edema.  Neurologic: No gross sensory or motor deficits. No tremors or fasciculations noted.    Diagnostic Data Reviewed:  BMET    Component Value Date/Time   NA 138 03/02/2024 1212   NA 141 08/20/2023 1305   K 4.3 03/02/2024 1212   CL 104 03/02/2024 1212   CO2 24 03/02/2024 1212   GLUCOSE 90 03/02/2024 1212   BUN 37 (H) 03/02/2024 1212   BUN 24 08/20/2023 1305   CREATININE 0.93 03/02/2024 1212   CREATININE 0.84 12/27/2015 0833   CALCIUM  10.2 03/02/2024 1212   GFRNONAA >60 01/02/2023 1200   GFRAA 38 (L) 09/29/2015 1553   Lab Results  Component Value Date   HGBA1C 5.3 03/13/2023   Lab Results  Component Value Date   INSULIN  22.3 08/20/2023   INSULIN  34.0 (H) 03/13/2023   Lab Results  Component Value Date   TSH 2.20 03/02/2024   CBC    Component Value Date/Time   WBC 7.9 03/02/2024 1212   RBC 5.94 (H) 03/02/2024 1212   HGB 15.7 (H) 03/02/2024 1212   HGB 15.2 11/19/2023 1559   HCT 50.0 (H) 03/02/2024 1212   HCT 48.3 (H) 11/19/2023 1559   PLT 208.0 03/02/2024 1212   PLT 212 11/19/2023 1559   MCV 84.2 03/02/2024  1212   MCV 86 11/19/2023 1559   MCH 26.9 11/19/2023 1559   MCH 27.2 01/02/2023 1200   MCHC 31.4 03/02/2024 1212   RDW 14.8 03/02/2024 1212   RDW 13.7 11/19/2023 1559   Iron Studies    Component Value Date/Time   IRON 77 03/13/2023 1109   TIBC 343 03/13/2023 1109   FERRITIN 59 03/13/2023 1109   IRONPCTSAT 22 03/13/2023 1109   Lipid Panel     Component Value Date/Time   CHOL 129 08/20/2023 1305   TRIG 62 08/20/2023 1305   HDL 56 08/20/2023 1305   CHOLHDL 2.8 03/13/2023 1109   LDLCALC 60 08/20/2023 1305   Hepatic Function Panel     Component Value Date/Time   PROT 7.8 03/02/2024 1212   PROT 7.2 08/20/2023 1305   ALBUMIN 4.7 03/02/2024 1212   ALBUMIN 4.6 08/20/2023 1305   AST 19 03/02/2024 1212   ALT 16 03/02/2024 1212   ALKPHOS 43 03/02/2024 1212   BILITOT 0.5 03/02/2024 1212   BILITOT 0.4 08/20/2023 1305   BILIDIR 0.15 11/14/2021 0912      Component Value Date/Time   TSH 2.20 03/02/2024 1212   Nutritional Lab Results  Component Value Date   VD25OH 69.1 08/20/2023   VD25OH 28.6 (L) 03/13/2023    Medications: Outpatient Encounter Medications as of 03/17/2024  Medication Sig   allopurinol (ZYLOPRIM) 100 MG tablet Take 100 mg by mouth daily.   aspirin  EC 81 MG tablet Take 1 tablet (81 mg total) by mouth daily. SWALLOW WHOLE.   cetirizine (ZYRTEC) 10 MG tablet Take 10 mg by mouth in the morning.   clobetasol (OLUX) 0.05 % topical foam Apply 1 Application topically 2 (two) times daily.   Colchicine (COLCRYS PO) Take 0.6 mg by mouth. Was twice daily, now once daily, then taper   dicyclomine  (BENTYL ) 10 MG capsule Take 1 capsule (10 mg total) by mouth 4 (four) times daily -  before meals and at bedtime.   ezetimibe  (ZETIA ) 10 MG tablet TAKE 1 TABLET BY MOUTH EVERY DAY   fluticasone  (FLONASE ) 50 MCG/ACT nasal spray Place 2 sprays into both nostrils daily.   pravastatin  (PRAVACHOL ) 40 MG tablet TAKE 1 TABLET BY MOUTH EVERY DAY IN THE EVENING   spironolactone   (ALDACTONE ) 50 MG tablet TAKE 1 TABLET BY MOUTH EVERY DAY   venlafaxine XR (EFFEXOR-XR) 75 MG 24 hr capsule Take 75 mg by mouth daily.   No facility-administered encounter medications on file as of 03/17/2024.     Follow-Up   Return in about 4 weeks (around 04/14/2024) for For Weight Mangement with Dr. [email or web address].[email or web address] She was informed of the importance of frequent follow up visits to maximize her success with intensive lifestyle modifications for her multiple health conditions.  Attestation Statement   Reviewed by clinician on day of visit: allergies, medications, problem list, medical history, surgical history, family history, social  history, and previous encounter notes.   I have spent 41 minutes in the care of the patient today including: 2 minutes before the visit reviewing and preparing the chart. 34 minutes face-to-face assessing and reviewing listed medical problems as outlined in obesity care plan, providing nutritional and behavioral counseling on topics outlined in the obesity care plan, counseling regarding anti-obesity medication as outlined in obesity care plan, independently interpreting test results and goals of care, as described in assessment and plan, reviewing and discussing biometric information and progress, and reviewing ED records and diagnostics with patient 5 minutes after the visit updating chart and documentation of encounter.    Lucas Parker, MD

## 2024-03-17 NOTE — Assessment & Plan Note (Signed)
 Weight management is ongoing with a recent weight gain of one pound, totaling a weight loss of fourteen pounds. Following a 1200 calorie nutrition plan 70% of the time. Engaging in water aerobics three times a week for 60 minutes. Not interested in GLP-1 medications. Discussed the challenges of maintaining weight loss with lifestyle changes alone, noting that 80% of people fail to maintain weight loss with lifestyle changes alone. Discussed the potential benefits of medications, which have an 88% response rate when combined with lifestyle changes. Emphasized the importance of maintaining a calorie deficit and suggested using meal replacements to achieve a 1000 calorie daily intake. Encouraged increasing protein and fiber intake to promote satiety. - Provide a 7-day 1000 calorie meal plan with Mediterranean and standard American options. - Encourage use of meal replacements for one meal per day. - Suggest increasing exercise intensity or duration if unable to reduce calorie intake further.

## 2024-03-17 NOTE — Assessment & Plan Note (Addendum)
 I reviewed overnight oxygen test which was somewhat limited but did not show any significant nighttime hypoxemia.  Recent CBC again showed increased hematocrit and RBC mass she is also experiencing pruritus but not with hot showers..  As discussed we will check erythropoietin  and JAK2 to complete evaluation.  If this is negative I recommend possibly repeating overnight oximetry as recent assessment was not adequate.

## 2024-03-17 NOTE — Assessment & Plan Note (Addendum)
 On CT scan in 2018 that showed diffuse hepatic steatosis.  Most recent labs showed normal liver enzymes and platelet count.  Recent ultrasound showed no hepatic steatosis.  Has declined treatment with GLP-1  Fibrosis 4 Score = 1.25   Continue with medically supervised weight management program.  Continue to work on reducing saturated fats in diet as well as simple and added sugars.  Losing 15% of body weight may improve condition she declined treatment with GLP-1.

## 2024-03-17 NOTE — Assessment & Plan Note (Signed)
 She reports good compliance with CPAP.  Losing 15% of body weight may reduce AHI continue with current weight management strategy

## 2024-03-22 LAB — ERYTHROPOIETIN: Erythropoietin: 11.5 m[IU]/mL (ref 2.6–18.5)

## 2024-03-22 LAB — JAK2 GENOTYPR

## 2024-03-22 NOTE — Addendum Note (Signed)
 Addended by: Nathalee Smarr N on: 03/22/2024 09:08 AM   Modules accepted: Orders

## 2024-03-23 ENCOUNTER — Ambulatory Visit (INDEPENDENT_AMBULATORY_CARE_PROVIDER_SITE_OTHER): Payer: Self-pay | Admitting: Internal Medicine

## 2024-04-14 ENCOUNTER — Ambulatory Visit (INDEPENDENT_AMBULATORY_CARE_PROVIDER_SITE_OTHER): Admitting: Internal Medicine

## 2024-04-14 ENCOUNTER — Encounter (INDEPENDENT_AMBULATORY_CARE_PROVIDER_SITE_OTHER): Payer: Self-pay | Admitting: Internal Medicine

## 2024-04-14 VITALS — BP 138/86 | HR 94 | Temp 98.4°F | Ht 64.0 in | Wt 177.0 lb

## 2024-04-14 DIAGNOSIS — D751 Secondary polycythemia: Secondary | ICD-10-CM | POA: Diagnosis not present

## 2024-04-14 DIAGNOSIS — E66811 Obesity, class 1: Secondary | ICD-10-CM

## 2024-04-14 DIAGNOSIS — E88819 Insulin resistance, unspecified: Secondary | ICD-10-CM

## 2024-04-14 DIAGNOSIS — Z6830 Body mass index (BMI) 30.0-30.9, adult: Secondary | ICD-10-CM

## 2024-04-14 DIAGNOSIS — I10 Essential (primary) hypertension: Secondary | ICD-10-CM | POA: Diagnosis not present

## 2024-04-14 NOTE — Assessment & Plan Note (Signed)
 Obesity with a BMI of 30. She has lost 15 pounds and maintained the weight loss but has reached a plateau. She follows a 1200 calorie nutrition plan 80% of the time and exercises three days a week with water aerobics. Lifestyle changes alone present challenges for further weight loss. On average, individuals lose 5-10% of their weight with lifestyle interventions, with only 20% maintaining the loss without additional measures. - Emphasize the importance of a calorie deficit for further weight loss. - Consider increasing physical activity or meal replacements to further reduce calorie intake. - Explore plant-based or high fiber diets to increase satiety. - Consider maintaining current weight if further weight loss is not feasible without medication. - Discuss the potential role of medications if lifestyle changes are insufficient.

## 2024-04-14 NOTE — Assessment & Plan Note (Signed)
 Blood pressure slightly above goal.  On spironolactone  without adverse effects.  Effexor may increase blood pressure she is also under significant stress.   most recent renal parameters reviewed which showed normal electrolytes and kidney function.  Continue with weight loss therapy. Losing 10% may improve blood pressure control. Monitor for symptoms of orthostasis while losing weight. Continue current regimen and home monitoring for a goal blood pressure of 120/80.

## 2024-04-14 NOTE — Assessment & Plan Note (Signed)
 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 Optimal level < 1.9.   This is complex condition associated with genetics, ectopic fat and lifestyle factors. Insulin  resistance may also result in weight gain, abnormal cravings (particularly for carbs) and fatigue.   This may result in additional weight gain and lead to pre-diabetes and diabetes if untreated. In addition, hyperinsulinemia increases cardiovascular risk, chronic inflammatory response and may increase the risk of obesity related malignancies.  Lab Results  Component Value Date   HGBA1C 5.3 03/13/2023   Lab Results  Component Value Date   INSULIN  22.3 08/20/2023   INSULIN  34.0 (H) 03/13/2023   Lab Results  Component Value Date   GLUCOSE 90 03/02/2024   GLUCOSE 83 09/29/2015   She has reached a weight loss plateau and is not interested in pharmacotherapy.  Continue with current nutritional strategies

## 2024-04-14 NOTE — Assessment & Plan Note (Signed)
 Recent CBC again showed increased hematocrit and RBC mass.  She had a negative erythropoietin  and JAK2 test.  I recommend she have a repeat overnight oximetry to rule out nocturnal hypoxemia.  Previous evaluation was incomplete.  We will discuss further with sleep center. I sent message to Lauraine Lites- PA at [facility]

## 2024-04-14 NOTE — Progress Notes (Signed)
 Office: [phone]  /  Fax: [phone]  Weight Summary and Body Composition Analysis (BIA)  Vitals Temp: 98.4 F (36.9 C) BP: 138/86 Pulse Rate: 94 SpO2: 98 %   Anthropometric Measurements Height: 5' 4 (1.626 m) Weight: 177 lb (80.3 kg) BMI (Calculated): 30.37 Weight at Last Visit: 178 lb Weight Lost Since Last Visit: 1 lb Weight Gained Since Last Visit: 0 lb Starting Weight: 192 lb Total Weight Loss (lbs): 15 lb (6.804 kg) Peak Weight: 202 lb   Body Composition  Body Fat %: 40.9 % Fat Mass (lbs): 72.6 lbs Muscle Mass (lbs): 99.4 lbs Total Body Water (lbs): 67.2 lbs Visceral Fat Rating : 11    RMR: 1469  Today's Visit #: 17  Starting Date: 03/13/23   Subjective   Chief Complaint: Obesity  Interval History Discussed the use of AI scribe software for clinical note transcription with the patient, who gave verbal consent to proceed.  History of Present Illness [name] is a 68 year old female who presents for medical weight management.  She has lost one pound since her last visit and adheres to a 1200 calorie nutrition plan 80% of the time. She engages in 60 minutes of water aerobics three days a week. She replaces one meal with a protein shake, specifically Orgain, every other day instead of daily.  She experiences difficulty adhering to the Mediterranean diet due to a dislike for eggs and yogurt. She consumes three meals a day, with one meal often replaced by a protein shake.  She has a history of elevated red blood cells. Testing to rule out polycythemia vera and excess erythropoietin  production by the kidneys returned negative. An overnight oxygen test was attempted but incomplete due to caregiving responsibilities for her mother.  Her daughter, Damien, has recurrent pancreatitis, with the most recent episode occurring last weekend. Damien has had six episodes and is awaiting a procedure to place a stent. Her daughter, Damien, has had multiple  hospitalizations and is under the care of specialists in [place].     Challenges affecting patient progress: moderate to high levels of stress, menopause, and inadequate response to nutritional and behavioral strategies.    Pharmacotherapy for weight management: She is currently taking no anti-obesity medication and patient has declined pharmacotherapy in past.   Assessment and Plan   Treatment Plan For Obesity:  Recommended Dietary Goals  Cheral is currently in the action stage of change. As such, her goal is to continue weight management plan. She has agreed to: incorporate 1-2 meal replacements a day for convenience  and continue current plan  Behavioral Health and Counseling  We discussed the following behavioral modification strategies today: continue to work on maintaining a reduced calorie state, getting the recommended amount of protein, incorporating whole foods, making healthy choices, staying well hydrated and practicing mindfulness when eating. and increase protein intake, fibrous foods (25 grams per day for women, 30 grams for men) and water to improve satiety and decrease hunger signals. .  Additional education and resources provided today: None  Recommended Physical Activity Goals  Eydie has been advised to work up to 150 minutes of moderate intensity aerobic activity a week and strengthening exercises 2-3 times per week for cardiovascular health, weight loss maintenance and preservation of muscle mass.  She has agreed to :  Think about enjoyable ways to increase daily physical activity and overcoming barriers to exercise, Increase physical activity in their day and reduce sedentary time (increase NEAT)., Increase volume of physical activity  to a goal of 240 minutes a week, and Combine aerobic and strengthening exercises for efficiency and improved cardiometabolic health.  Medical Interventions and Pharmacotherapy  We discussed various medication options to help Luan  with her weight loss efforts and we both agreed to : Continue with current nutritional and behavioral strategies and Has declined pharmacotherapy  Associated Conditions Impacted by Obesity Treatment  Assessment & Plan Primary hypertension Blood pressure slightly above goal.  On spironolactone  without adverse effects.  Effexor may increase blood pressure she is also under significant stress.   most recent renal parameters reviewed which showed normal electrolytes and kidney function.  Continue with weight loss therapy. Losing 10% may improve blood pressure control. Monitor for symptoms of orthostasis while losing weight. Continue current regimen and home monitoring for a goal blood pressure of 120/80.   Class 1 obesity with serious comorbidity and body mass index (BMI) of 30.0 to 30.9 in adult, unspecified obesity type Obesity with a BMI of 30. She has lost 15 pounds and maintained the weight loss but has reached a plateau. She follows a 1200 calorie nutrition plan 80% of the time and exercises three days a week with water aerobics. Lifestyle changes alone present challenges for further weight loss. On average, individuals lose 5-10% of their weight with lifestyle interventions, with only 20% maintaining the loss without additional measures. - Emphasize the importance of a calorie deficit for further weight loss. - Consider increasing physical activity or meal replacements to further reduce calorie intake. - Explore plant-based or high fiber diets to increase satiety. - Consider maintaining current weight if further weight loss is not feasible without medication. - Discuss the potential role of medications if lifestyle changes are insufficient. Insulin  resistance Her HOMA-IR is 4.95 which is elevated but improved from previous of 7.3. Optimal level < 1.9.   This is complex condition associated with genetics, ectopic fat and lifestyle factors. Insulin  resistance may also result in weight gain,  abnormal cravings (particularly for carbs) and fatigue.   This may result in additional weight gain and lead to pre-diabetes and diabetes if untreated. In addition, hyperinsulinemia increases cardiovascular risk, chronic inflammatory response and may increase the risk of obesity related malignancies.  Lab Results  Component Value Date   HGBA1C 5.3 03/13/2023   Lab Results  Component Value Date   INSULIN  22.3 08/20/2023   INSULIN  34.0 (H) 03/13/2023   Lab Results  Component Value Date   GLUCOSE 90 03/02/2024   GLUCOSE 83 09/29/2015   She has reached a weight loss plateau and is not interested in pharmacotherapy.  Continue with current nutritional strategies  Polycythemia  Recent CBC again showed increased hematocrit and RBC mass.  She had a negative erythropoietin  and JAK2 test.  I recommend she have a repeat overnight oximetry to rule out nocturnal hypoxemia.  Previous evaluation was incomplete.  We will discuss further with sleep center. I sent message to Lauraine Lites- PA at [facility]         Objective   Physical Exam:  Blood pressure 138/86, pulse 94, temperature 98.4 F (36.9 C), height 5' 4 (1.626 m), weight 177 lb (80.3 kg), SpO2 98%. Body mass index is 30.38 kg/m.  General: She is overweight, cooperative, alert, well developed, and in no acute distress. PSYCH: Has normal mood, affect and thought process.   HEENT: EOMI, sclerae are anicteric. Lungs: Normal breathing effort, no conversational dyspnea. Extremities: No edema.  Neurologic: No gross sensory or motor deficits. No tremors or fasciculations noted.  Diagnostic Data Reviewed:  BMET    Component Value Date/Time   NA 138 03/02/2024 1212   NA 141 08/20/2023 1305   K 4.3 03/02/2024 1212   CL 104 03/02/2024 1212   CO2 24 03/02/2024 1212   GLUCOSE 90 03/02/2024 1212   BUN 37 (H) 03/02/2024 1212   BUN 24 08/20/2023 1305   CREATININE 0.93 03/02/2024 1212   CREATININE 0.84 12/27/2015 0833    CALCIUM  10.2 03/02/2024 1212   GFRNONAA >60 01/02/2023 1200   GFRAA 38 (L) 09/29/2015 1553   Lab Results  Component Value Date   HGBA1C 5.3 03/13/2023   Lab Results  Component Value Date   INSULIN  22.3 08/20/2023   INSULIN  34.0 (H) 03/13/2023   Lab Results  Component Value Date   TSH 2.20 03/02/2024   CBC    Component Value Date/Time   WBC 7.9 03/02/2024 1212   RBC 5.94 (H) 03/02/2024 1212   HGB 15.7 (H) 03/02/2024 1212   HGB 15.2 11/19/2023 1559   HCT 50.0 (H) 03/02/2024 1212   HCT 48.3 (H) 11/19/2023 1559   PLT 208.0 03/02/2024 1212   PLT 212 11/19/2023 1559   MCV 84.2 03/02/2024 1212   MCV 86 11/19/2023 1559   MCH 26.9 11/19/2023 1559   MCH 27.2 01/02/2023 1200   MCHC 31.4 03/02/2024 1212   RDW 14.8 03/02/2024 1212   RDW 13.7 11/19/2023 1559   Iron Studies    Component Value Date/Time   IRON 77 03/13/2023 1109   TIBC 343 03/13/2023 1109   FERRITIN 59 03/13/2023 1109   IRONPCTSAT 22 03/13/2023 1109   Lipid Panel     Component Value Date/Time   CHOL 129 08/20/2023 1305   TRIG 62 08/20/2023 1305   HDL 56 08/20/2023 1305   CHOLHDL 2.8 03/13/2023 1109   LDLCALC 60 08/20/2023 1305   Hepatic Function Panel     Component Value Date/Time   PROT 7.8 03/02/2024 1212   PROT 7.2 08/20/2023 1305   ALBUMIN 4.7 03/02/2024 1212   ALBUMIN 4.6 08/20/2023 1305   AST 19 03/02/2024 1212   ALT 16 03/02/2024 1212   ALKPHOS 43 03/02/2024 1212   BILITOT 0.5 03/02/2024 1212   BILITOT 0.4 08/20/2023 1305   BILIDIR 0.15 11/14/2021 0912      Component Value Date/Time   TSH 2.20 03/02/2024 1212   Nutritional Lab Results  Component Value Date   VD25OH 69.1 08/20/2023   VD25OH 28.6 (L) 03/13/2023    Medications: Outpatient Encounter Medications as of 04/14/2024  Medication Sig   allopurinol (ZYLOPRIM) 100 MG tablet Take 100 mg by mouth daily.   aspirin  EC 81 MG tablet Take 1 tablet (81 mg total) by mouth daily. SWALLOW WHOLE.   cetirizine (ZYRTEC) 10 MG tablet Take  10 mg by mouth in the morning.   clobetasol (OLUX) 0.05 % topical foam Apply 1 Application topically 2 (two) times daily.   Colchicine (COLCRYS PO) Take 0.6 mg by mouth. Was twice daily, now once daily, then taper   dicyclomine  (BENTYL ) 10 MG capsule Take 1 capsule (10 mg total) by mouth 4 (four) times daily -  before meals and at bedtime.   ezetimibe  (ZETIA ) 10 MG tablet TAKE 1 TABLET BY MOUTH EVERY DAY   fluticasone  (FLONASE ) 50 MCG/ACT nasal spray Place 2 sprays into both nostrils daily.   pravastatin  (PRAVACHOL ) 40 MG tablet TAKE 1 TABLET BY MOUTH EVERY DAY IN THE EVENING   spironolactone  (ALDACTONE ) 50 MG tablet TAKE 1 TABLET BY MOUTH EVERY  DAY   venlafaxine XR (EFFEXOR-XR) 75 MG 24 hr capsule Take 75 mg by mouth daily.   No facility-administered encounter medications on file as of 04/14/2024.     Follow-Up   Return in about 8 weeks (around 06/09/2024) for For Weight Mangement with Dr. Francyne.SABRA She was informed of the importance of frequent follow up visits to maximize her success with intensive lifestyle modifications for her multiple health conditions.  Attestation Statement   Reviewed by clinician on day of visit: allergies, medications, problem list, medical history, surgical history, family history, social history, and previous encounter notes.     Lucas Francyne, MD

## 2024-04-16 ENCOUNTER — Other Ambulatory Visit: Payer: Self-pay | Admitting: Acute Care

## 2024-04-16 DIAGNOSIS — D751 Secondary polycythemia: Secondary | ICD-10-CM

## 2024-04-22 ENCOUNTER — Ambulatory Visit: Admitting: Allergy & Immunology

## 2024-04-22 ENCOUNTER — Encounter: Payer: Self-pay | Admitting: Allergy & Immunology

## 2024-04-22 ENCOUNTER — Encounter: Payer: Self-pay | Admitting: Pediatrics

## 2024-04-22 VITALS — BP 140/98 | HR 80 | Temp 97.3°F | Ht 64.0 in | Wt 182.4 lb

## 2024-04-22 DIAGNOSIS — J301 Allergic rhinitis due to pollen: Secondary | ICD-10-CM | POA: Diagnosis not present

## 2024-04-22 DIAGNOSIS — Z91014 Allergy to mammalian meats: Secondary | ICD-10-CM | POA: Diagnosis not present

## 2024-04-22 DIAGNOSIS — R1084 Generalized abdominal pain: Secondary | ICD-10-CM

## 2024-04-22 NOTE — Progress Notes (Signed)
 NEW PATIENT  Date of Service/Encounter:  04/22/24  Consult requested by: Avva, Ravisankar, MD   Assessment:   Generalized abdominal pain  Alpha-gal syndrome   Seasonal allergic rhinitis due to pollen  Shortness of breath  Plan/Recommendations:   1. Generalized abdominal pain - We can test you to other foods, including the most common foods. - We will do that at the next visit.  2. Alpha-gal syndrome - I think your levels are VERY low and likely non-relevant.  - We are going to recheck that today to see where things are heading. - I would think that this is not related to all of the symptoms that you are experiencing.   3. Seasonal allergies - We will do testing for environmental allergies via the blood since we are getting blood work anyway.   4. Return in about 2 weeks (around 05/06/2024) for SKIN TESTING (1-55). You can have the follow up appointment with Dr. Iva or a Nurse Practicioner (our Nurse Practitioners are excellent and always have Physician oversight!).    This note in its entirety was forwarded to the Provider who requested this consultation.  Subjective:   [name] is a 68 y.o. female presenting today for evaluation of  Chief Complaint  Patient presents with   Alpha Gal Syndrome    Reason for referral     [name] has a history of the following: Patient Active Problem List   Diagnosis Date Noted   Chronic diarrhea 10/02/2023   Polycythemia 09/04/2023   Class 1 obesity with serious comorbidity and body mass index (BMI) of 30.0 to 30.9 in adult 07/03/2023   Irritable bowel syndrome with both constipation and diarrhea 06/19/2023   Insulin  resistance 06/04/2023   Metabolic dysfunction-associated steatotic liver disease (MASLD) 06/04/2023   Vitamin D  deficiency 03/26/2023   Gout 03/13/2023   Iron deficiency 03/13/2023   Unspecified severe protein-calorie malnutrition 03/13/2023   Generalized obesity 03/13/2023   BMI  30.0-30.9,adult 03/13/2023   Guillain Barr syndrome 01/16/2023   Coronary artery disease involving native coronary artery of native heart without angina pectoris 08/24/2021   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  Discussed the use of AI scribe software for clinical note transcription with the patient and/or guardian, who gave verbal consent to proceed.  Niels KANDICE Apa was referred by Janey Santos, MD.     [name] is a 68 y.o. female presenting for an evaluation of alpha gal syndrome.  She has a longstanding history of chronic abdominal pain.  Her last visit with GI was in August 2025.  She saw Dr. Suzann at [facility].  At that time, it was felt that her stomach pain was related to irritable bowel syndrome.  She has previously followed with gastroenterology at [facility].  She had a colonoscopy in 2018 that showed a new lesion in the appendiceal orifice.  Biopsies were negative for dysplasia or malignancy.  She had another follow-up colonoscopy in 2022, although results were not available in the system.  She has a longstanding history of GERD.  She has been diagnosed with a  hiatal hernia and Schatzki's ring.  Symptoms improved with a 20 pound weight loss and dietary modifications.  She has a history of a fatty liver.  She is on a protein-based diet to help with insulin  resistance.  She had a KUB in August 2025 that showed significant stool burden.   As part of this workup, she had an alpha gal panel sent that was mildly positive.  She  does not eat a lot of red meat anyway, but has never noted worsening symptoms with that.  Her celiac panel was normal.  Total IgA was 385.  Inflammatory markers were normal.  CBC with differential showed an elevated hemoglobin which is another issue with her.  She has a history of polycythemia, but Jak mutation analysis has been normal.  Component     Latest Ref Rng 03/02/2024  Beef     kU/L 0.20 (H)   Class 0/1   Class 0   Allergen, Mutton, f88     kU/L 0.10 (H)   Class 0/1   Allergen, Pork, f26     kU/L <0.10   GALACTOSE-ALPHA-1,3-GALACTOSE IGE*     <0.10 kU/L <0.10    Allergic Rhinitis Symptom History: She does have some environmental allergy symptoms.  She has never been tested in the past.  She is open to doing some testing.  She does not take an antihistamine regularly.  Otherwise, there is no history of other atopic diseases, including asthma, food allergies, drug allergies, environmental allergies, stinging insect allergies, eczema, urticaria, or contact dermatitis. There is no significant infectious history.  Vaccinations are up to date.    Past Medical History: Patient Active Problem List   Diagnosis Date Noted   Chronic diarrhea 10/02/2023   Polycythemia 09/04/2023   Class 1 obesity with serious comorbidity and body mass index (BMI) of 30.0 to 30.9 in adult 07/03/2023   Irritable bowel syndrome with both constipation and diarrhea 06/19/2023   Insulin  resistance 06/04/2023   Metabolic dysfunction-associated steatotic liver disease (MASLD) 06/04/2023   Vitamin D  deficiency 03/26/2023   Gout 03/13/2023   Iron deficiency 03/13/2023   Unspecified severe protein-calorie malnutrition 03/13/2023   Generalized obesity 03/13/2023   BMI 30.0-30.9,adult 03/13/2023   Guillain Barr syndrome 01/16/2023   Coronary artery disease involving native coronary artery of native heart without angina pectoris 08/24/2021   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   Elevated coronary artery  calcium  score 12/06/2020   Mixed hyperlipidemia 10/12/2020   Primary hypertension 01/19/2020   Chronic diastolic (congestive) heart failure (HCC) 01/19/2020   Dyspnea on exertion 11/05/2019   Hiatal hernia 11/05/2019   PSVT (paroxysmal supraventricular tachycardia) 01/01/2016   Thyroid  nodule 04/14/2013   OSA on CPAP 04/06/2013   Daytime somnolence 04/06/2013    Medication List:  Allergies as of 04/22/2024       Reactions   Other Itching   Antiseptic Solution   Rosuvastatin  Other (See Comments)        Medication List        Accurate as of April 22, 2024  2:00 PM. If you have any questions, ask your nurse or doctor.          allopurinol 100 MG tablet Commonly known as: ZYLOPRIM Take 100 mg by mouth daily.   aspirin  EC 81 MG tablet Take 1 tablet (81 mg total) by mouth daily. SWALLOW WHOLE.   cetirizine 10 MG tablet Commonly known as: ZYRTEC Take 10 mg by mouth in the morning.   clobetasol 0.05 % topical foam Commonly  known as: OLUX Apply 1 Application topically 2 (two) times daily.   COLCRYS PO Take 0.6 mg by mouth. Was twice daily, now once daily, then taper   dicyclomine  10 MG capsule Commonly known as: BENTYL  Take 1 capsule (10 mg total) by mouth 4 (four) times daily -  before meals and at bedtime.   ezetimibe  10 MG tablet Commonly known as: ZETIA  TAKE 1 TABLET BY MOUTH EVERY DAY   fluticasone  50 MCG/ACT nasal spray Commonly known as: FLONASE  Place 2 sprays into both nostrils daily.   pravastatin  40 MG tablet Commonly known as: PRAVACHOL  TAKE 1 TABLET BY MOUTH EVERY DAY IN THE EVENING   spironolactone  50 MG tablet Commonly known as: ALDACTONE  TAKE 1 TABLET BY MOUTH EVERY DAY   venlafaxine XR 75 MG 24 hr capsule Commonly known as: EFFEXOR-XR Take 75 mg by mouth daily.        Birth History: non-contributory  Developmental History: non-contributory  Past Surgical History: Past Surgical History:  Procedure Laterality Date    ABLATION  09/29/2017   APPENDECTOMY     BACK SURGERY  04/06/2019   BREAST EXCISIONAL BIOPSY Left    1990s - benign   CATARACT EXTRACTION Bilateral    COLONOSCOPY     ELECTROPHYSIOLOGIC STUDY N/A 01/01/2016   Procedure: SVT Ablation;  Surgeon: Danelle LELON Birmingham, MD;  [facility] INVASIVE CV LAB;  Service: Cardiovascular;  Laterality: N/A;   ESOPHAGOGASTRODUODENOSCOPY     HAND SURGERY Right    KNEE ARTHROPLASTY Left    NASAL SEPTUM SURGERY     RADICAL HYSTERECTOMY  2000   RIGHT HEART CATH N/A 05/22/2021   Procedure: RIGHT HEART CATH;  Surgeon: [place], Peter M, MD;  Location: [facility] INVASIVE CV LAB;  Service: Cardiovascular;  Laterality: N/A;   TONSILLECTOMY AND ADENOIDECTOMY       Family History: Family History  Problem Relation Age of Onset   Heart disease Mother    Hypertension Mother    Diabetes Mother    Hyperlipidemia Mother    Heart disease Father    Hypertension Father    Stroke Father    Hyperlipidemia Father    Depression Father    Anxiety disorder Father    Diabetes Maternal Grandmother    Sleep apnea Neg Hx    BRCA 1/2 Neg Hx    Breast cancer Neg Hx    Neuropathy Neg Hx     Social History: Jestina lives at home with her family.  She is in house that is around 67 years old.  There is carpeting throughout the home.  They have gas heating and central cooling.  There are no animals inside or outside of the home.  There are dust mite covers on the bed and the pillows.  There is no tobacco exposure.  She is currently retired.  There are no fume, chemical, or dust exposure.  They do not live near an interstate or industrial area.   Review of systems otherwise negative other than that mentioned in the HPI.    Objective:   Blood pressure (!) 140/98, pulse 80, temperature (!) 97.3 F (36.3 C), temperature source Temporal, height 5' 4 (1.626 m), weight 182 lb 6.4 oz (82.7 kg), SpO2 97%. Body mass index is 31.31 kg/m.     Physical Exam Vitals reviewed.   Constitutional:      Appearance: She is well-developed.     Comments: Pleasant.  Talkative.  HENT:     Head: Normocephalic and atraumatic.     Right  Ear: Tympanic membrane, ear canal and external ear normal. No drainage, swelling or tenderness. Tympanic membrane is not injected, scarred, erythematous, retracted or bulging.     Left Ear: Tympanic membrane, ear canal and external ear normal. No drainage, swelling or tenderness. Tympanic membrane is not injected, scarred, erythematous, retracted or bulging.     Nose: No nasal deformity, septal deviation, mucosal edema or rhinorrhea.     Right Turbinates: Enlarged, swollen and pale.     Left Turbinates: Enlarged, swollen and pale.     Right Sinus: No maxillary sinus tenderness or frontal sinus tenderness.     Left Sinus: No maxillary sinus tenderness or frontal sinus tenderness.     Mouth/Throat:     Mouth: Mucous membranes are not pale and not dry.     Pharynx: Uvula midline.  Eyes:     General:        Right eye: No discharge.        Left eye: No discharge.     Conjunctiva/sclera: Conjunctivae normal.     Right eye: Right conjunctiva is not injected. No chemosis.    Left eye: Left conjunctiva is not injected. No chemosis.    Pupils: Pupils are equal, round, and reactive to light.  Cardiovascular:     Rate and Rhythm: Normal rate and regular rhythm.     Heart sounds: Normal heart sounds.  Pulmonary:     Effort: Pulmonary effort is normal. No tachypnea, accessory muscle usage or respiratory distress.     Breath sounds: Normal breath sounds. No wheezing, rhonchi or rales.  Chest:     Chest wall: No tenderness.  Abdominal:     Tenderness: There is no abdominal tenderness. There is no guarding or rebound.  Lymphadenopathy:     Head:     Right side of head: No submandibular, tonsillar or occipital adenopathy.     Left side of head: No submandibular, tonsillar or occipital adenopathy.     Cervical: No cervical adenopathy.  Skin:     General: Skin is warm.     Capillary Refill: Capillary refill takes less than 2 seconds.     Coloration: Skin is not pale.     Findings: No abrasion, erythema, petechiae or rash. Rash is not macular, papular, urticarial or vesicular.  Neurological:     Mental Status: She is alert.  Psychiatric:        Behavior: Behavior is cooperative.      Diagnostic studies: labs sent instead      Marty Shaggy, MD [facility] 

## 2024-04-22 NOTE — Patient Instructions (Addendum)
 1. Generalized abdominal pain - We can test you to other foods, including the most common foods. - We will do that at the next visit.  2. Alpha-gal syndrome - I think your levels are VERY low and likely non-relevant.  - We are going to recheck that today to see where things are heading. - I would think that this is not related to all of the symptoms that you are experiencing.   3. Seasonal allergies - We will do testing for environmental allergies via the blood since we are getting blood work anyway.   4. Return in about 2 weeks (around 05/06/2024) for SKIN TESTING (1-55). You can have the follow up appointment with Dr. Iva or a Nurse Practicioner (our Nurse Practitioners are excellent and always have Physician oversight!).    Please inform us  of any Emergency Department visits, hospitalizations, or changes in symptoms. Call us  before going to the ED for breathing or allergy symptoms since we might be able to fit you in for a sick visit. Feel free to contact us  anytime with any questions, problems, or concerns.  It was a pleasure to meet you today!  Websites that have reliable patient information: 1. American Academy of Asthma, Allergy, and Immunology: www.aaaai.org 2. Food Allergy Research and Education (FARE): foodallergy.org 3. Mothers of Asthmatics: http://www.asthmacommunitynetwork.org 4. American College of Allergy, Asthma, and Immunology: www.acaai.org      "Like" us  on Facebook and Instagram for our latest updates!      A healthy democracy works best when [employer] participate! Make sure you are registered to vote! If you have moved or changed any of your contact information, you will need to get this updated before voting! Scan the QR codes below to learn more!

## 2024-04-25 LAB — ALLERGENS W/COMP RFLX AREA 2
Alternaria Alternata IgE: <0.1 kU/L
Aspergillus Fumigatus IgE: <0.1 kU/L
Bermuda Grass IgE: <0.1 kU/L
Cedar, Mountain IgE: <0.1 kU/L
Cladosporium Herbarum IgE: <0.1 kU/L
Cockroach, German IgE: <0.1 kU/L
Common Silver Birch IgE: 0.17 kU/L — AB
Cottonwood IgE: <0.1 kU/L
D Farinae IgE: <0.1 kU/L
D Pteronyssinus IgE: <0.1 kU/L
E001-IgE Cat Dander: <0.1 kU/L
E005-IgE Dog Dander: <0.1 kU/L
Elm, American IgE: <0.1 kU/L
Johnson Grass IgE: <0.1 kU/L
Maple/Box Elder IgE: <0.1 kU/L
Mouse Urine IgE: <0.1 kU/L
Oak, White IgE: <0.1 kU/L
Pecan, Hickory IgE: <0.1 kU/L
Penicillium Chrysogen IgE: <0.1 kU/L
Pigweed, Rough IgE: <0.1 kU/L
Ragweed, Short IgE: <0.1 kU/L
Sheep Sorrel IgE Qn: <0.1 kU/L
Timothy Grass IgE: <0.1 kU/L
White Mulberry IgE: <0.1 kU/L

## 2024-04-25 LAB — ALPHA-GAL PANEL
Allergen Lamb IgE: 0.17 kU/L — AB
Beef IgE: 0.21 kU/L — AB
IgE (Immunoglobulin E), Serum: 36 [IU]/mL (ref 6–495)
O215-IgE Alpha-Gal: <0.1 kU/L
Pork IgE: <0.1 kU/L

## 2024-04-25 LAB — TRYPTASE: Tryptase: 22.9 ug/L — ABNORMAL HIGH (ref 2.2–13.2)

## 2024-04-26 ENCOUNTER — Encounter: Payer: Self-pay | Admitting: Allergy & Immunology

## 2024-05-02 ENCOUNTER — Ambulatory Visit: Payer: Self-pay | Admitting: Allergy & Immunology

## 2024-05-02 DIAGNOSIS — R748 Abnormal levels of other serum enzymes: Secondary | ICD-10-CM

## 2024-05-03 ENCOUNTER — Ambulatory Visit: Admitting: Pediatrics

## 2024-05-09 LAB — TRYPTASE: Tryptase: 22.7 ug/L — ABNORMAL HIGH (ref 2.2–13.2)

## 2024-05-12 NOTE — Telephone Encounter (Signed)
 Please schedule patient overnight oximetry test as soon as possible

## 2024-05-13 ENCOUNTER — Encounter: Payer: Self-pay | Admitting: Allergy & Immunology

## 2024-05-13 ENCOUNTER — Ambulatory Visit (INDEPENDENT_AMBULATORY_CARE_PROVIDER_SITE_OTHER): Admitting: Allergy & Immunology

## 2024-05-13 DIAGNOSIS — R1084 Generalized abdominal pain: Secondary | ICD-10-CM

## 2024-05-13 DIAGNOSIS — L272 Dermatitis due to ingested food: Secondary | ICD-10-CM

## 2024-05-13 DIAGNOSIS — Z91014 Allergy to mammalian meats: Secondary | ICD-10-CM | POA: Diagnosis not present

## 2024-05-13 DIAGNOSIS — R748 Abnormal levels of other serum enzymes: Secondary | ICD-10-CM | POA: Diagnosis not present

## 2024-05-13 NOTE — Progress Notes (Signed)
 FOLLOW UP  Date of Service/Encounter:  05/13/24   Assessment:   Generalized abdominal pain   Alpha-gal syndrome    Seasonal allergic rhinitis due to pollen   Shortness of breath  Plan/Recommendations:   1. Generalized abdominal pain - Testing was slightly reactive to wheat as well as pistachio, cabbage, and pineapple. - Avoid these as well as cashews for one month until we see you again to see if this helps at all.   2. Alpha-gal syndrome - I think that your levels were very low and they were stable on repeat testing.   3. Elevated tryptase - I think that we need to go ahead and do a workup for Mast Cell Activation Syndrome JUST to be on the safe side.  - Mast cell activation syndrome (MCAS) is a condition that doctors may consider when patients have many different symptoms and certain blood test results. The diagnosis is still debated, and the rules for making it are still changing. Right now, doctors may think about MCAS if the blood test called serum tryptase goes up a lot during a flare of symptoms and then goes back down to normal once the symptoms calm down. Another clue is when symptoms get much better with medicines that block mast cells, like antihistamines (H1 or H2 blockers) or montelukast. - To check for MCAS, we are ordering blood tests for serum tryptase during symptom flares, usually within 4-6 hours after symptoms start.  - In the meantime, we will treat symptoms with a mix of antihistamines and other medicines that help reduce mast cell activity. If symptoms are severe, we may think about adding Xolair (omalizumab), which has been shown to lower the chance of mast cell-related problems, including serious allergic reactions. Also, if you know certain things trigger your symptoms, it is best to avoid those triggers when possible.  3. Seasonal allergies - with testing positive to trees - Continue with an antihistamine as needed.   4. Return in about 4 weeks (around  06/10/2024). You can have the follow up appointment with Dr. Iva or a Nurse Practicioner (our Nurse Practitioners are excellent and always have Physician oversight!).     Subjective:   [name] is a 68 y.o. female presenting today for follow up of No chief complaint on file.   [name] [name] has a history of the following: Patient Active Problem List   Diagnosis Date Noted   Chronic diarrhea 10/02/2023   Polycythemia 09/04/2023   Class 1 obesity with serious comorbidity and body mass index (BMI) of 30.0 to 30.9 in adult 07/03/2023   Irritable bowel syndrome with both constipation and diarrhea 06/19/2023   Insulin  resistance 06/04/2023   Metabolic dysfunction-associated steatotic liver disease (MASLD) 06/04/2023   Vitamin D  deficiency 03/26/2023   Gout 03/13/2023   Iron deficiency 03/13/2023   Unspecified severe protein-calorie malnutrition 03/13/2023   Generalized obesity 03/13/2023   BMI 30.0-30.9,adult 03/13/2023   Guillain Barr syndrome 01/16/2023   Coronary artery disease involving native coronary artery of native heart without angina pectoris 08/24/2021   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  Discussed the use of AI scribe software for clinical note transcription with the patient and/or guardian, who gave verbal consent to proceed.  [name] is a 68 y.o. female presenting for skin testing. She was last seen on September 2025. We could not do testing because her insurance company does not cover testing on the same day  as a New Patient visit. She has been off of all antihistamines 3 days in anticipation of the testing.   She experiences generalized itching without hives, though occasional rashes occur. She has been taking Zyrtec for a year.  She has elevated tryptase levels. She is unfamiliar with mast cell activation syndrome and is seeking further evaluation. Despite consuming red meat, she has not noticed any worsening of symptoms.  Her diet includes cashews, known high histamine trigger foods, and she is attempting to avoid wheat. She is concerned about the cost of additional lab tests but notes she has met her deductible.  No hives reported, but she experiences itching all over her body.  Otherwise, there have been no changes to her past medical history, surgical history, family history, or social history.    Review of systems otherwise negative other than that mentioned in the HPI.    Objective:   There were no vitals taken for this visit. There is no height or weight on file to calculate BMI.    Physical exam deferred since this was a skin testing appointment only.   Diagnostic studies:   Allergy Studies:     Food Adult Perc - 05/13/24 1400     Time Antigen Placed 1400    Allergen Manufacturer Jestine    Location Back    Number of allergen test 72     Control-buffer 50% Glycerol Negative    Control-Histamine 2+    1. Peanut Negative    2. Soybean Negative    3. Wheat --   4 x 5   4. Sesame Negative    5. Milk, Cow Negative    6. Casein Negative    7. Egg White, Chicken Negative    8. Shellfish Mix Negative    9. Fish Mix Negative    10. Cashew Negative    11. Walnut Food Negative    12. Almond Negative    13. Hazelnut Negative    14. Pecan Food Negative    15. Pistachio --   4 x 5   16. [place] Nut Negative    17. Coconut Negative    18. Trout Negative    19. Tuna Negative    20. Salmon Negative    21. Flounder Negative    22. Codfish Negative    23. Shrimp Negative     24. Crab Negative    25. Lobster Negative    26. Oyster Negative    27. Scallops Negative    28. Oat  Negative    29. Rice Negative    30. Barley Negative    31. Rye  Negative    32. Hops Negative    33. [place] Meat Negative    34. Chicken Meat Negative    35. Pork Negative    36. Beef Negative    37. Lamb Negative    38. Tomato Negative    39. White Potato Negative    40. Sweet Potato Negative    41. Pea, Green/English Negative    42. [place] Bean Negative    43. Green Beans Negative    44. Squash Negative  45. Green Pepper Negative    46. Mushrooms Negative    47. Onion Negative    48. Avocado Negative    49. Cabbage --   2 x 3   50. Carrots Negative    51. Celery Negative    52. Corn Negative    53. Cucumber Negative    54. Grape (White seedless) Negative    55. Orange  Negative    56. Lemon Negative    57. Banana Negative    58. Apple Negative    59. Peach Negative    60. Strawberry Negative    61. Blueberry Negative    62. Cherry Negative    63. Cantaloupe Negative    64. Watermelon Negative    65. Pineapple --   3 x 4   66. Chocolate/Cacao Bean Negative    67. Cinnamon Negative    68. Nutmeg Negative    69. Ginger Negative    70. Garlic Negative    71. Pepper, Black Negative    72. Mustard Negative          Allergy testing results were read and interpreted by myself, documented by clinical staff.      Marty Shaggy, MD  [facility] 

## 2024-05-13 NOTE — Patient Instructions (Addendum)
 1. Generalized abdominal pain - Testing was slightly reactive to wheat as well as pistachio, cabbage, and pineapple. - Avoid these as well as cashews for one month until we see you again to see if this helps at all.   2. Alpha-gal syndrome - I think that your levels were very low and they were stable on repeat testing.   3. Elevated tryptase - I think that we need to go ahead and do a workup for Mast Cell Activation Syndrome JUST to be on the safe side.  - Mast cell activation syndrome (MCAS) is a condition that doctors may consider when patients have many different symptoms and certain blood test results. The diagnosis is still debated, and the rules for making it are still changing. Right now, doctors may think about MCAS if the blood test called serum tryptase goes up a lot during a flare of symptoms and then goes back down to normal once the symptoms calm down. Another clue is when symptoms get much better with medicines that block mast cells, like antihistamines (H1 or H2 blockers) or montelukast. - To check for MCAS, we are ordering blood tests for serum tryptase during symptom flares, usually within 4-6 hours after symptoms start.  - In the meantime, we will treat symptoms with a mix of antihistamines and other medicines that help reduce mast cell activity. If symptoms are severe, we may think about adding Xolair (omalizumab), which has been shown to lower the chance of mast cell-related problems, including serious allergic reactions. Also, if you know certain things trigger your symptoms, it is best to avoid those triggers when possible.       3. Seasonal allergies - with testing positive to trees - Continue with an antihistamine as needed.   4. Return in about 4 weeks (around 06/10/2024). You can have the follow up appointment with Dr. Iva or a Nurse Practicioner (our Nurse Practitioners are excellent and always have Physician oversight!).    Please inform us  of any Emergency  Department visits, hospitalizations, or changes in symptoms. Call us  before going to the ED for breathing or allergy symptoms since we might be able to fit you in for a sick visit. Feel free to contact us  anytime with any questions, problems, or concerns.  It was a pleasure to meet you today!  Websites that have reliable patient information: 1. American Academy of Asthma, Allergy, and Immunology: www.aaaai.org 2. Food Allergy Research and Education (FARE): foodallergy.org 3. Mothers of Asthmatics: http://www.asthmacommunitynetwork.org 4. American College of Allergy, Asthma, and Immunology: www.acaai.org      "Like" us  on Facebook and Instagram for our latest updates!      A healthy democracy works best when [employer] participate! Make sure you are registered to vote! If you have moved or changed any of your contact information, you will need to get this updated before voting! Scan the QR codes below to learn more!       Food Adult Perc - 05/13/24 1400     Time Antigen Placed 1400    Allergen Manufacturer Jestine    Location Back    Number of allergen test 72     Control-buffer 50% Glycerol Negative    Control-Histamine 2+    1. Peanut Negative    2. Soybean Negative    3. Wheat --   4 x 5   4. Sesame Negative    5. Milk, Cow Negative    6. Casein Negative    7. Egg White, Chicken Negative  8. Shellfish Mix Negative    9. Fish Mix Negative    10. Cashew Negative    11. Walnut Food Negative    12. Almond Negative    13. Hazelnut Negative    14. Pecan Food Negative    15. Pistachio --   4 x 5   16. [place] Nut Negative    17. Coconut Negative    18. Trout Negative    19. Tuna Negative    20. Salmon Negative    21. Flounder Negative    22. Codfish Negative    23. Shrimp Negative    24. Crab Negative    25. Lobster Negative    26. Oyster Negative    27. Scallops Negative    28. Oat  Negative    29. Rice Negative    30. Barley Negative    31. Rye  Negative     32. Hops Negative    33. [place] Meat Negative    34. Chicken Meat Negative    35. Pork Negative    36. Beef Negative    37. Lamb Negative    38. Tomato Negative    39. White Potato Negative    40. Sweet Potato Negative    41. Pea, Green/English Negative    42. [place] Bean Negative    43. Green Beans Negative    44. Squash Negative    45. Green Pepper Negative    46. Mushrooms Negative    47. Onion Negative    48. Avocado Negative    49. Cabbage --   2 x 3   50. Carrots Negative    51. Celery Negative    52. Corn Negative    53. Cucumber Negative    54. Grape (White seedless) Negative    55. Orange  Negative    56. Lemon Negative    57. Banana Negative    58. Apple Negative    59. Peach Negative    60. Strawberry Negative    61. Blueberry Negative    62. Cherry Negative    63. Cantaloupe Negative    64. Watermelon Negative    65. Pineapple --   3 x 4   66. Chocolate/Cacao Bean Negative    67. Cinnamon Negative    68. Nutmeg Negative    69. Ginger Negative    70. Garlic Negative    71. Pepper, Black Negative    72. Mustard Negative           Hereditary Alpha Tryptasemia and Hereditary Alpha Tryptasemia Syndrome  What is tryptase? Tryptase is a protein that can circulate in your bloodstream. It is made primarily by cells that are present around blood vessels and in the bone marrow called mast cells, and it is used largely as a marker for mast cell activation, as it can be easily measured by a blood test, especially after certain allergic reactions.  What is a mast cell and what does it do? A mast cell is a cell that is made in the bone marrow and is associated with allergic reactions; it matures in places like the skin, lungs, and gastrointestinal tract. Mast cells may play a role in protecting us  from parasites but also can contribute to allergic responses by releasing molecules such as histamine in response to allergens.  What is hereditary alpha  tryptasemia? Hereditary alpha tryptasemia can be called a biochemical trait. A trait is simply a characteristic that is caused by a difference in the DNA. In  the case of hereditary alpha tryptasemia, people with this trait have inherited extra copies of the alpha tryptase gene (TPSAB1), and this leads to increased levels of trypase protein detected in the blood, whether a reaction is happening or not. These duplications are carried on a single chromosome and can be inherited from parent to child. In some cases, both parents can carry the duplication, so that a child could have four copies. In other cases, patients actually carry three copies of TPSAB1 on a single chromosome. It appears that the more copies one inherits, the higher the blood tryptase level.  How common is hereditary alpha tryptasemia? Elevated serum tryptase is present in perhaps up to 6 percent of the general population. While large studies of multiple different ethnicities need to be done, the estimate is that hereditary alpha tryptasemia may be present in a similar percentage of the general population. Because that means that there could be millions of people carrying multiple copies of the alpha tryptase gene, it should come as no surprise that some people will have more than one explanation for their symptoms. Again, it must be stressed that there is great variability from person to person in terms of what symptoms the duplications or triplications do or do not cause.  What is hereditary alpha tryptasemia syndrome? In addition to having higher blood tryptase levels, individuals with more alpha tryptase copies also report more shared symptoms. These symptoms can be associated with multiple organ systems and may be hard to explain. These symptoms may include allergic-like symptoms such as skin itching, flushing, hives, and even anaphylaxis; gastrointestinal (GI) symptoms such as bloating, abdominal pain, diarrhea and/or constipation  (frequently diagnosed as irritable bowel syndrome or IBS), heartburn, reflux, and difficulty swallowing; connective tissue symptoms such as hypermobile joints and scoliosis; cardiac symptoms such as a racing or pounding heartbeat or blood pressure swings sometimes with fainting; as well as anxiety, depression, chronic pain, panic attacks, and others. These patients may find that others in their family have similar or related symptoms, as this is a genetic syndrome. Others may have few if any symptoms--and would be said only to have the trait and not the syndrome associated with the trait. In cases such as these, a person may only find out because a relative was more severely affected with the syndrome.  How do I know if I have hereditary alpha tryptasemia, or hereditary alpha tryptasemia syndrome? If you have a blood tryptase level above 10 ng/mL, in particular if another close relative also has a similarly elevated level, you are more likely to have hereditary alpha tryptasemia. However, a wide range of symptoms has been reported among individuals with the associated syndrome, many of which can be rather common, so it is difficult to know who has it from symptoms alone.  Several features that may be shared among those who have hereditary alpha tryptasemia syndrome are multiple symptoms affecting a variety of systems including (but not limited to) these:  Chronic skin flushing, itching, or hives Bee sting allergy Dizziness and/or difficulty maintaining a normal pulse and blood pressure Chronic head, back, and joint pain Skeletal abnormalities GI disturbances including heartburn, IBS, and numerous food and drug reactions and intolerances Sleep disturbances  Because some people who carry the extra alpha tryptase gene copies exhibit few if any of these symptoms, we are working to determine just how common each is in people who have hereditary alpha tryptasemia. More importantly, because the duplication  is so common in the general population, we  are studying what percentage of patients from the general population who have individual symptoms from allergic to GI to skeletal to others--actually have hereditary alpha tryptasemia. Until then we cannot be completely sure which symptoms--whether or not they are on the list above-- can be directly attributed to having hereditary alpha tryptasemia.  Is hereditary alpha tryptasemia syndrome a form of mast cell activation syndrome (MCAS)? This is an area of ongoing research. It is not clear the extent to which activated mast cells contribute to this disease, nor whether mast cell activation plays any role in symptoms. There are many similarities between patients who have been diagnosed with MCAS and those who have hereditary alpha tryptasemia syndrome. Whether hereditary alpha tryptasemia syndrome could be present in a subset of patients with MCAS is not yet known. It is also possible that the increased tryptase itself causes the symptoms without requiring mast cells to be activated, or it could cause an abnormally increased response to otherwise normal mast cell activation, which might explain why so many patients respond to medications that target mast cells and substances released by mast cells.  What happens if my tryptase level is normal, but I have these symptoms and/or so do multiple other family members? There are many people who do not have hereditary alpha tryptasemia syndrome but do have all of the symptoms listed above. We do not know yet the association, but this is an area of active research.  How can I get tested for hereditary alpha tryptasemia syndrome? Patients who suspect they may have hereditary alpha tryptasemia syndrome should first have a baseline blood tryptase test drawn by their doctor, if they haven't already. It should not be drawn immediately after a major allergic reaction, as that can lead to an elevated tryptase for a different  reason. A serum level greater than 10 ng/ml is suggestive of alpha tryptasemia, while a level lower than 8ng/ml makes this diagnosis far less likely. The duplication cannot be easily identified through usual genetic testing including microarrays, or whole exome sequencing. A commercial test is offered by at least one vendor ([employer]).  Will being tested positive for hereditary alpha tryptasemia change how my symptoms and disease are managed? Right now, the answer is no. Symptomatic treatment targeting individual symptoms is the only route for management at present. Future research will be dedicated to identifying symptoms and populations commonly associated with hereditary alpha tryptasemia and finding a treatment that specifically targets alpha tryptase and its mechanisms of action to better advise and manage those who carry multiple alpha genes.  If I have hereditary alpha tryptasemia, do I need to still worry about mastocytosis? Again, we do not yet have the answer. If your doctor suspects mastocytosis, the same tests and workup for this rare but serious disease should be performed according to published guidelines and recommendations.  Do I need to worry about my future if I have hereditary alpha tryptasemia syndrome? While the course of symptoms can be quite variable over time and we do not know the natural history of this disease, we at present have no reason to suspect those with multiple alpha alleles will have a shortened life span. This disorder has likely been present for many generations within specific families, and, while our judgment is only based on the patients we have seen, having multiple alpha alleles appears compatible with long, productive lives.  How can I get treated for hereditary alpha tryptasemia syndrome? Until therapies directly related to the genetic change are discovered, symptoms  are treated individually. Because tryptase is made by mast cells, and many symptoms seen  among individuals with the hereditary alpha tryptasemia syndrome have been associated with the release of mast cell-derived mediators such as histamine, several of the clinical approaches used to treat the results of mast cell activation including antihistamines may be used and often are helpful. These approaches should be discussed with your doctor. Treatment usually requires trial and error and a lot of patience. Also, treatment may only be partially successful, but unfortunately there are no randomized clinical trials yet to show definitive treatments that work for hereditary alpha tryptasemia syndrome.

## 2024-05-22 LAB — PROSTAGLANDIN D2, SERUM: Prostaglandin D2, serum: 3.5 pg/mL

## 2024-05-23 LAB — KIT (D816V) DIGITAL PCR: CKIT Result: NEGATIVE

## 2024-05-25 NOTE — Progress Notes (Addendum)
 [name]   Referring Provider Janey Santos, MD [address] [place],  [place] [address]  Primary Care Provider Janey Santos, MD  Patient Profile: [name] is a 68 y.o. female who returns to the [facility] office for follow-up of the problem(s) noted below.  Problem List: IBS w/ diarrhea and constipation GERD Hiatal hernia and paraesophageal hernia Schatzki's ring Suspected esophageal motility disorder on barium esophagram 2022 History of appendiceal mass 2018 - benign pathology Personal history of colon tubular adenomas MASLD  History of Present Illness   [name] was last seen in the GI office 03/02/2024  Current GI Meds  Dicyclomine  10 mg capsule p.o. twice daily IBgard  Interval History    Discussed the use of AI scribe software for clinical note transcription with the patient, who gave verbal consent to proceed.  History of Present Illness [name] is a 68 year old female with a history of pulmonary artery aneurysm, PSVT, HTN, diastolic heart failure, CAD, HLD, OSA on CPAP, Guillain-Barr syndrome, gout who turns to the office for follow-up of IBS with diarrhea and constipation, GERD, fatty liver/MASLD  IBS with diarrhea and constipation - Reports lifelong issues with altered bowel habits - States she was previously given a diagnosis of IBS and told it was due to anxiety/stress and all in her head - Labs performed 02/2024 to rule out other etiologies of GI symptoms: Normal CBC, CMP, ESR, CRP, thyroid  profile, celiac panel; folate borderline at 18 weakly positive beef/nonalpha gal panel - Seen by allergy and immunology-no evidence of alpha gal syndrome - Tryptase level checked by allergy and is mildly elevated-they are performing additional workup for mast cell activation syndrome - KUB 03/05/2024 showed moderate colonic stool burden -could have a component of encopresis  - After last visit I advised a trial  of Bentyl  10 mg p.o. 3 times daily for fecal urgency and diarrhea - Reports that symptoms improved but she developed what she describes as dyskinesia symptoms of her face - Our office advised her to decrease dicyclomine  and trial Ibgard  - At today's visit she reports she is using Bentyl  10 mg p.o. twice daily in conjunction with IBgard - Bowel movements are irregular, with anxiety related to bowel habits. - Bowel movements occur once or twice daily when using Bentyl , with improved consistency and form. - Anxiety about bowel habits leads to occasionally forcing a second bowel movement. - Bentyl  is less effective than prior use of Imodium, which caused constipation. - In past Symptoms improved after eliminating mozzarella cheese, despite negative lactose intolerance tests - Feels that IBS symptoms were best controlled when she was on Prozac in the remote past; stopped Prozac during pregnancy and did not see benefit once it was restarted later - No improvement with probiotics  Gastroesophageal reflux symptoms - Records indicate a previous history of GERD, hiatal hernia and Schatzki's ring - Reflux symptoms have improved with a 20-pound weight loss and dietary modifications - Not currently taking reflux medication  Hepatic imaging findings-suspected - MASLD - Previous imaging studies have shown a history of fatty liver - Rarely consumes alcohol - No history of blood transfusion, IVDU or tattoos - No family history of liver disease  - Follows a protein-based diet to address insulin  resistance - Metabolic parameters have improved with dietary changes - Fibrosis 4 Score = 1.51  - Updated AUS 03/11/2024 with normal liver echogenicity -likely positive result of previously losing weight  History of colonic adenomas - [facility] colonoscopy report from  2018 documents a previous history of nonadvanced colonic adenoma - Colonoscopy 2018 showed a polypoid lesion at the appendiceal orifice as well as  2 sessile polyps in the transverse colon -path report not available - Underwent appendectomy for the appendiceal lesion with benign pathology per her report - Last colonoscopy performed in 03/2021 at [facility]: two first colon polyps 3 to 4 mm (TA) - 5-year follow-up recommended  GI Review of Symptoms Significant for diarrhea fecal urgency otherwise negative.  General Review of Systems  Review of systems is significant for the pertinent positives and negatives as listed per the HPI.  Full ROS is otherwise negative.  Past Medical History   Past Medical History:  Diagnosis Date   ADD (attention deficit disorder)    Anxiety    Back pain    CHF (congestive heart failure) (HCC)    Colon polyps 2022   diminutive adenomas, Dr Lamar Buccini   Constipation    Daytime somnolence 04/06/2013   DDD (degenerative disc disease), lumbar    Depression    Depression    GERD (gastroesophageal reflux disease)    Hiatal hernia    Hypercholesteremia    Hypertension    IBS (irritable bowel syndrome)    Joint pain    OSA on CPAP 04/06/2013   Palpitations    Sleep apnea    Swelling of both lower extremities    Thyroid  condition    Urinary incontinence      Past Surgical History   Past Surgical History:  Procedure Laterality Date   ABLATION  09/29/2017   APPENDECTOMY     BACK SURGERY  04/06/2019   BREAST EXCISIONAL BIOPSY Left    1990s - benign   CATARACT EXTRACTION Bilateral    COLONOSCOPY     ELECTROPHYSIOLOGIC STUDY N/A 01/01/2016   Procedure: SVT Ablation;  Surgeon: Danelle LELON Birmingham, MD;  Location: [facility] INVASIVE CV LAB;  Service: Cardiovascular;  Laterality: N/A;   ESOPHAGOGASTRODUODENOSCOPY     HAND SURGERY Right    KNEE ARTHROPLASTY Left    NASAL SEPTUM SURGERY     RADICAL HYSTERECTOMY  2000   RIGHT HEART CATH N/A 05/22/2021   Procedure: RIGHT HEART CATH;  Surgeon: Jordan, Peter M, MD;  Location: [facility] INVASIVE CV LAB;  Service: Cardiovascular;  Laterality: N/A;   TONSILLECTOMY AND  ADENOIDECTOMY       Allergies and Medications   Allergies  Allergen Reactions   Other Itching    Antiseptic Solution   Rosuvastatin  Other (See Comments)    Current Meds  Medication Sig   alclomethasone (ACLOVATE) 0.05 % cream Apply 1 Application topically 2 (two) times daily.   allopurinol (ZYLOPRIM) 100 MG tablet Take 100 mg by mouth daily.   aspirin  EC 81 MG tablet Take 1 tablet (81 mg total) by mouth daily. SWALLOW WHOLE.   cetirizine (ZYRTEC) 10 MG tablet Take 10 mg by mouth in the morning.   clobetasol (OLUX) 0.05 % topical foam Apply 1 Application topically 2 (two) times daily.   Colchicine (COLCRYS PO) Take 0.6 mg by mouth. Was twice daily, now once daily, then taper   dicyclomine  (BENTYL ) 10 MG capsule Take 1 capsule (10 mg total) by mouth 4 (four) times daily -  before meals and at bedtime.   estradiol (ESTRACE) 0.1 MG/GM vaginal cream SMARTSIG:1 Gram(s) Vaginal 2-3 Times Weekly   ezetimibe  (ZETIA ) 10 MG tablet TAKE 1 TABLET BY MOUTH EVERY DAY   fluticasone  (FLONASE ) 50 MCG/ACT nasal spray Place 2 sprays into both nostrils  daily.   ketoconazole (NIZORAL) 2 % cream Apply 1 Application topically 2 (two) times daily.   pravastatin  (PRAVACHOL ) 40 MG tablet TAKE 1 TABLET BY MOUTH EVERY DAY IN THE EVENING   spironolactone  (ALDACTONE ) 50 MG tablet TAKE 1 TABLET BY MOUTH EVERY DAY   venlafaxine XR (EFFEXOR-XR) 75 MG 24 hr capsule Take 75 mg by mouth daily.     Family History   Family History  Problem Relation Age of Onset   Heart disease Mother    Hypertension Mother    Diabetes Mother    Hyperlipidemia Mother    Heart disease Father    Hypertension Father    Stroke Father    Hyperlipidemia Father    Depression Father    Anxiety disorder Father    Diabetes Maternal Grandmother    Sleep apnea Neg Hx    BRCA 1/2 Neg Hx    Breast cancer Neg Hx    Neuropathy Neg Hx      Social History   Social History   Tobacco Use   Smoking status: Never   Smokeless tobacco:  Never  Vaping Use   Vaping status: Never Used  Substance Use Topics   Alcohol use: Not Currently    Alcohol/week: 0.0 standard drinks of alcohol   Drug use: No   [name] reports that she has never smoked. She has never used smokeless tobacco. She reports that she does not currently use alcohol. She reports that she does not use drugs.  Vital Signs and Physical Examination   Vitals:   05/27/24 0926  BP: (!) 150/80  Pulse: 91    Body mass index is 31.5 kg/m. Weight: 183 lb 8 oz (83.2 kg)  General: Well developed, well nourished, no acute distress Head: Normocephalic and atraumatic Eyes: Sclerae anicteric, EOMI Lungs: Clear throughout to auscultation Heart: Regular rate and rhythm; No murmurs, rubs or bruits Abdomen: Soft, non tender and non distended. No masses, hepatosplenomegaly or hernias noted. Normal Bowel sounds Rectal: Deferred Musculoskeletal: Symmetrical with no gross deformities   Review of Data  The following data was reviewed at the time of this encounter:  Laboratory Studies      Latest Ref Rng & Units 03/02/2024   12:12 PM 11/19/2023    3:59 PM 08/20/2023    1:05 PM  CBC  WBC 4.0 - 10.5 K/uL 7.9  8.3  7.5   Hemoglobin 12.0 - 15.0 g/dL 84.2  84.7  83.8   Hematocrit 36.0 - 46.0 % 50.0  48.3  51.7   Platelets 150.0 - 400.0 K/uL 208.0  212  237     No results found for: LIPASE    Latest Ref Rng & Units 03/02/2024   12:12 PM 08/20/2023    1:05 PM 03/13/2023   11:09 AM  CMP  Glucose 70 - 99 mg/dL 90  86  88   BUN 6 - 23 mg/dL 37  24  17   Creatinine 0.40 - 1.20 mg/dL 9.06  9.15  9.01   Sodium 135 - 145 mEq/L 138  141  141   Potassium 3.5 - 5.1 mEq/L 4.3  5.1  4.5   Chloride 96 - 112 mEq/L 104  103  104   CO2 19 - 32 mEq/L 24  21  21    Calcium  8.4 - 10.5 mg/dL 89.7  9.9  89.9   Total Protein 6.0 - 8.3 g/dL 7.8  7.2  7.4   Total Bilirubin 0.2 - 1.2 mg/dL 0.5  0.4  0.6  Alkaline Phos 39 - 117 U/L 43  49  52   AST 0 - 37 U/L 19  18  24    ALT 0 - 35 U/L 16   20  20     Lab Results  Component Value Date   TSH 2.20 03/02/2024    Lab Results  Component Value Date   ESRSEDRATE 27 03/02/2024   POCTSEDRATE 34 (A) 10/21/2015   Lab Results  Component Value Date   CRP <1.0 03/02/2024    02/2024 Celiac panel negative Alpha-gal panel-negative IgE; mildly elevated levels to beef and mutton  Labs performed with allergy practice: Tryptase 22.9, 22.7   Imaging Studies  KUB 03/02/2024 1. Moderate colonic stool burden with suspected colonic redundancy. No bowel obstruction. 2. Retrocardiac hiatal hernia.  AUS 03/08/2024 Cholelithiasis without sonographic evidence of acute cholecystitis No focal liver lesions Normal hepatic parenchymal echogenicity  Barium esophagram 02/06/2021 1. Esophageal motility disorder. 2. Small sliding-type hiatal hernia and moderate to large adjacent paraesophageal hernia. 3. Lower esophageal mucosal B ring. The 13 mm barium pill passed without difficulty. 4. Episodes of spontaneous GE reflux.  GI Procedures and Studies   EGD 04/05/2021 Normal esophagus 7 cm hiatal hernia Erosive gastropathy consistent with Cameron's erosions within large hiatal hernia Erythematous duodenopathy  Colonoscopy 04/05/2021 Two 3-4 mm polyps in the transverse colon Diverticulosis in the proximal ascending colon Some particulate matter scattered throughout the colon, obscuring base of cecum (status post appendectomy with resection of previously noted mucocele) 5-year follow-up colonoscopy advised  Colonoscopy 2018 - concerning for a new lesion at the appendiceal orifice along with several polyps. All were biopsied. Per the Pathology report, Mucin and polyp at the appendiceal orifice were noted to be negative for dysplasia and malignancy. There was evidence of tubular adenoma from the transverse colon polyp.    Clinical Impression  It is my clinical impression that [name] is a 68 y.o. female with;  IBS w/ diarrhea and  constipation GERD Hiatal hernia and paraesophageal hernia Schatzki's ring Suspected esophageal motility disorder on barium esophagram 2022 History of appendiceal mass 2018 - benign pathology Personal history of colon tubular adenomas MASLD  [name] returns to the office today for follow-up of IBS with diarrhea and constipation.  She relates a longstanding history throughout most of her life of irregular bowel habits.  At the time of her initial consultation, she was particularly troubled by diarrhea, increased bowel frequency and episodes of incontinence.  Additional laboratory testing ruled out celiac disease, laboratory bowel disease and thyroid  dysfunction.  Folate was borderline elevated at 19 but she was not endorsing symptoms of bloating to suggest SIBO.  Alpha gal panel showed mildly elevated levels to beef and mutton.  I referred her to allergy and immunology for further evaluation of alpha gal syndrome.  Her allergist does not feel she has alpha gal, however, tryptase level was mildly elevated on 2 occasions and she is currently undergoing testing for mast cell activation syndrome.  At the time of her last visit I prescribed Bentyl  as she had not been trialed on antispasmodics.  She noted improvement in her bowel frequency and urgency with Bentyl  but developed symptoms that worried her for dyskinetic facial movements.  She has decreased her Bentyl  to 10 mg p.o. twice daily and is using IBgard with satisfactory results.  She acknowledges that there is a component of anxiety contributing to her symptoms and is actively working with her mental health care providers in this vein.  Reviewed that if dicyclomine  is  ineffective we could can consider other antispasmodic such as hyoscyamine or glycopyrrolate.  [name]'s previous GI history is noteworthy for GERD, hiatal hernia and Schatzki's ring.  She reports that her GERD symptoms significantly improved after 20 pound weight loss and she is no longer on  antacids.  Her previous imaging has disclosed evidence of fatty liver and suspect she may have MASLD.  Liver enzymes have been normal.  She does not have risk factors for other forms of chronic liver disease.  Updated abdominal ultrasound 02/2024 showed normal liver echogenicity which likely reflects improvement in fatty liver from her previous efforts at weight loss.  Her previous colonoscopies have been documented to have shown nonadvanced tubular adenomas as well as an appendiceal polypoid mass in 2018 that was benign on pathology after appendectomy.  Last colonoscopy in 2022 disclosed 2 nonadvanced tubular adenomas.  A 5-year follow-up was advised given her history of polyps and previous polypoid mass in the cecum.  Plan  Continue Bentyl  10 mg p.o. twice daily in conjunction with IBgard daily -hyoscyamine or glycopyrrolate may be other options in the future Discussed addition of fiber in diet and provided fiber handout in clinic Continue to follow-up with allergy for mast cell activation syndrome workup Continue multidisciplinary management of IBS in conjunction with mental health providers If symptoms persist, consider stool testing for fecal elastase in the future If symptoms persist, consider breath testing for SIBO in the future High-protein, low-fat diet and maintaining a healthy weight for management of MASLD Diet and lifestyle modification for GERD Next surveillance colonoscopy due 03/2026  Planned Follow Up 6  months  The patient or caregiver verbalized understanding of the material covered, with no barriers to understanding. All questions were answered. Patient or caregiver is agreeable with the plan outlined above.    It was a pleasure to see [name].  If you have any questions or concerns regarding this evaluation, do not hesitate to contact me.  Inocente Hausen, MD [facility]   I spent total of 35 minutes in both face-to-face (20 minutes interview) and  non-face-to-face (15 minutes chart review, care coordination, documentation)  activities, excluding procedures performed, for the visit on the date of this encounter.

## 2024-05-27 ENCOUNTER — Encounter: Payer: Self-pay | Admitting: Pediatrics

## 2024-05-27 ENCOUNTER — Ambulatory Visit (INDEPENDENT_AMBULATORY_CARE_PROVIDER_SITE_OTHER): Admitting: Pediatrics

## 2024-05-27 VITALS — BP 150/80 | HR 91 | Ht 64.0 in | Wt 183.5 lb

## 2024-05-27 DIAGNOSIS — K760 Fatty (change of) liver, not elsewhere classified: Secondary | ICD-10-CM | POA: Diagnosis not present

## 2024-05-27 DIAGNOSIS — Z860100 Personal history of colon polyps, unspecified: Secondary | ICD-10-CM

## 2024-05-27 DIAGNOSIS — K589 Irritable bowel syndrome without diarrhea: Secondary | ICD-10-CM | POA: Diagnosis not present

## 2024-05-27 DIAGNOSIS — K219 Gastro-esophageal reflux disease without esophagitis: Secondary | ICD-10-CM

## 2024-05-27 NOTE — Patient Instructions (Signed)
 Follow up in 6 months.  Thank you for entrusting me with your care and for choosing [facility], Dr. Inocente Hausen  _______________________________________________________  If your blood pressure at your visit was 140/90 or greater, please contact your primary care physician to follow up on this.  _______________________________________________________  If you are age 67 or older, your body mass index should be between 23-30. Your Body mass index is 31.5 kg/m. If this is out of the aforementioned range listed, please consider follow up with your Primary Care Provider.  If you are age 13 or younger, your body mass index should be between 19-25. Your Body mass index is 31.5 kg/m. If this is out of the aformentioned range listed, please consider follow up with your Primary Care Provider.   ________________________________________________________  The [facility] providers would like to encourage you to use MYCHART to communicate with providers for non-urgent requests or questions.  Due to long hold times on the telephone, sending your provider a message by [facility] may be a faster and more efficient way to get a response.  Please allow 48 business hours for a response.  Please remember that this is for non-urgent requests.  _______________________________________________________  [facility] is using a team-based approach to care.  Your team is made up of your doctor and two to three APPS. Our APPS (Nurse Practitioners and Physician Assistants) work with your physician to ensure care continuity for you. They are fully qualified to address your health concerns and develop a treatment plan. They communicate directly with your gastroenterologist to care for you. Seeing the Advanced Practice Practitioners on your physician's team can help you by facilitating care more promptly, often allowing for earlier appointments, access to diagnostic testing, procedures, and other specialty  referrals.

## 2024-05-29 ENCOUNTER — Encounter: Payer: Self-pay | Admitting: Pediatrics

## 2024-06-02 ENCOUNTER — Encounter (INDEPENDENT_AMBULATORY_CARE_PROVIDER_SITE_OTHER): Payer: Self-pay | Admitting: Internal Medicine

## 2024-06-02 ENCOUNTER — Ambulatory Visit (INDEPENDENT_AMBULATORY_CARE_PROVIDER_SITE_OTHER): Payer: Self-pay | Admitting: Internal Medicine

## 2024-06-02 VITALS — BP 130/85 | HR 62 | Temp 97.9°F | Ht 64.0 in | Wt 178.0 lb

## 2024-06-02 DIAGNOSIS — I10 Essential (primary) hypertension: Secondary | ICD-10-CM

## 2024-06-02 DIAGNOSIS — K760 Fatty (change of) liver, not elsewhere classified: Secondary | ICD-10-CM

## 2024-06-02 DIAGNOSIS — E88819 Insulin resistance, unspecified: Secondary | ICD-10-CM

## 2024-06-02 DIAGNOSIS — E66811 Obesity, class 1: Secondary | ICD-10-CM

## 2024-06-02 DIAGNOSIS — D751 Secondary polycythemia: Secondary | ICD-10-CM | POA: Diagnosis not present

## 2024-06-02 DIAGNOSIS — Z6830 Body mass index (BMI) 30.0-30.9, adult: Secondary | ICD-10-CM

## 2024-06-02 LAB — 2,3-DINOR 11BPG F2, 24HR URINE
2,3-dinor 11B-Prostaglandin: <602 pg/mg{creat} (ref <1802)
Creatinine Concentration,24 HR: 52 mg/dL
Creatinine, 24 HR, U: 1383 mg/(24.h) (ref 603–1783)

## 2024-06-02 LAB — N-METHYLHISTAMINE, 24 HR, U
Collection Duration (h): 24 h
Creatinine Concent. 24 Hr, U: 52 mg/dL
Creatinine, 24 Hour, U: 1383 mg/(24.h) (ref 603–1783)
N-Methylhistamine, 24 Hr, U: 110 ug/g{creat} (ref 30–200)
Urine Volume (mL): 2660 mL

## 2024-06-02 LAB — LEUKOTRIENE E4, 24 HR, U
Collection Duration: 24 h
Creatinine Concentration,24 HR: 52 mg/dL
Creatinine, 24 HR, U: 1383 mg/(24.h) (ref 603–1783)
Leukotriene E4, U: 79 pg/mg{creat} (ref <=104)
Urine Volume: 2660 mL

## 2024-06-02 LAB — PROSTAGLANDIN D2/CREATININE, U
Creatinine, Urine: 53 mg/dL
Prostaglandin D2, urine: 2.2 pg/mL
Prostaglandin D2/Cr Ratio: 4.2 ng/g

## 2024-06-02 NOTE — Assessment & Plan Note (Signed)
 Weight maintenance with no significant weight loss over the past two months. Current energy intake and output are imbalanced. Metabolic rate is 8530 calories per day. Current caloric intake is not low enough for weight loss. Discussed increasing physical activity and dietary modifications to create a caloric deficit. Intermittent fasting and meal replacements discussed as strategies to manage caloric intake and improve satiety. Emphasized the importance of protein and fiber intake to manage hunger and improve satiety. Discussed the challenges of weight loss without medications and the role of physical activity and dietary changes in creating a caloric deficit. - Increase physical activity to 240 minutes per week. - Implement intermittent fasting with an 8-hour eating window. - Use meal replacements for breakfast. - Consume 450-500 calories for lunch and dinner. - Prioritize protein and fiber intake to manage hunger. - Weigh weekly to monitor progress.

## 2024-06-02 NOTE — Assessment & Plan Note (Signed)
 Blood pressure slightly above goal.  On spironolactone  without adverse effects.   Most recent renal parameters in Care Everywhere were reviewed which showed normal electrolytes and kidney function.  Continue with weight loss therapy. Losing 10% may improve blood pressure control. Monitor for symptoms of orthostasis while losing weight. Continue current regimen and home monitoring for a goal blood pressure of 120/80.

## 2024-06-02 NOTE — Assessment & Plan Note (Signed)
 Recent CBC showed a decrease in hemoglobin.  She had a negative erythropoietin  and JAK2 test.  I recommend she have a repeat overnight oximetry to rule out nocturnal hypoxemia.  Previous evaluation was incomplete.  Pulmonary has been trying to get overnight oxygen test completed she will follow-up with vendor

## 2024-06-02 NOTE — Progress Notes (Signed)
 Office: [phone]  /  Fax: [phone]  Weight Summary and Body Composition Analysis (BIA)  Vitals Temp: 97.9 F (36.6 C) BP: 130/85 Pulse Rate: 62 SpO2: 99 %   Anthropometric Measurements Height: 5' 4 (1.626 m) Weight: 178 lb (80.7 kg) BMI (Calculated): 30.54 Weight at Last Visit: 177 lb Weight Lost Since Last Visit: 0 lb Weight Gained Since Last Visit: 1 lb Starting Weight: 192 lb Total Weight Loss (lbs): 14 lb (6.35 kg) Peak Weight: 202 lb   Body Composition  Body Fat %: 41 % Fat Mass (lbs): 73.4 lbs Muscle Mass (lbs): 100 lbs Total Body Water (lbs): 66.2 lbs Visceral Fat Rating : 11    RMR: 1469  Today's Visit #: 18  Starting Date: 03/13/23   Subjective   Chief Complaint: Obesity  Interval History Discussed the use of AI scribe software for clinical note transcription with the patient, who gave verbal consent to proceed.  History of Present Illness [name] is a 68 year old female who presents for medical weight management.  Since last office visit [name] has gained 1 pound.  [name] reports following a 1200-calorie nutrition plan 70% of the time [name] has not been tracking [name] reports eating more whole foods getting the recommended amount of protein [name] is exercising 2 days a week 60 minutes mostly cardio.  [name] has experienced a weight gain of one pound since her last office visit. [name] adheres to a calorie nutrition plan approximately seventy percent of the time. [name] uses meal replacements for lunch four to five days a week and has eliminated mozzarella cheese from her diet. [name] has not yet tried intermittent fasting.  [name] has encountered issues with scheduling an overnight oxygen test. [name] has been in contact with the pulmonary department and a third-party vendor, [employer], to resolve the issue.  Recent lab results show a cholesterol level of 46, blood sugar of 84, and hemoglobin of 15.1.     Challenges affecting patient progress: difficulty  maintaining a reduced calorie state, low volume of physical activity at present , and menopause.    Pharmacotherapy for weight management: [name] is currently taking no anti-obesity medication and patient has declined pharmacotherapy in past.   Assessment and Plan   Treatment Plan For Obesity:  Recommended Dietary Goals  [name] is currently in the action stage of change. As such, her goal is to continue weight management plan. [name] has agreed to: follow the Category 1 plan - 1000 kcal per day, incorporate 1-2 meal replacements a day for convenience , and incorporate time restricted eating 16 hours fasting with an 8 hour eating window while maintaining reduced calorie nutrition plan  Behavioral Health and Counseling  We discussed the following behavioral modification strategies today: continue to work on maintaining a reduced calorie state, getting the recommended amount of protein, incorporating whole foods, making healthy choices, staying well hydrated and practicing mindfulness when eating. and increase protein intake, fibrous foods (25 grams per day for women, 30 grams for men) and water to improve satiety and decrease hunger signals. .  Additional education and resources provided today: Handout on traveling and holiday eating strategies  Recommended Physical Activity Goals  [name] has been advised to work up to 150 minutes of moderate intensity aerobic activity a week and strengthening exercises 2-3 times per week for cardiovascular health, weight loss maintenance and preservation of muscle mass.  [name] has agreed to :  Increase volume of physical activity to a goal of 240 minutes a  week and Combine aerobic and strengthening exercises for efficiency and improved cardiometabolic health.  Medical Interventions and Pharmacotherapy  We discussed various medication options to help [name] with her weight loss efforts and we both agreed to : Continue with current nutritional and behavioral  strategies and Has declined pharmacotherapy  Associated Conditions Impacted by Obesity Treatment  Assessment & Plan Class 1 obesity with serious comorbidity and body mass index (BMI) of 30.0 to 30.9 in adult, unspecified obesity type Weight maintenance with no significant weight loss over the past two months. Current energy intake and output are imbalanced. Metabolic rate is 8530 calories per day. Current caloric intake is not low enough for weight loss. Discussed increasing physical activity and dietary modifications to create a caloric deficit. Intermittent fasting and meal replacements discussed as strategies to manage caloric intake and improve satiety. Emphasized the importance of protein and fiber intake to manage hunger and improve satiety. Discussed the challenges of weight loss without medications and the role of physical activity and dietary changes in creating a caloric deficit. - Increase physical activity to 240 minutes per week. - Implement intermittent fasting with an 8-hour eating window. - Use meal replacements for breakfast. - Consume 450-500 calories for lunch and dinner. - Prioritize protein and fiber intake to manage hunger. - Weigh weekly to monitor progress. Primary hypertension Blood pressure slightly above goal.  On spironolactone  without adverse effects.   Most recent renal parameters in Care Everywhere were reviewed which showed normal electrolytes and kidney function.  Continue with weight loss therapy. Losing 10% may improve blood pressure control. Monitor for symptoms of orthostasis while losing weight. Continue current regimen and home monitoring for a goal blood pressure of 120/80.   Metabolic dysfunction-associated steatotic liver disease (MASLD) On CT scan in 2018 that showed diffuse hepatic steatosis.  Most recent labs showed normal liver enzymes and platelet count.  Recent ultrasound showed no hepatic steatosis.  Has declined treatment with GLP-1.  Recent labs  showed normal platelet count and liver enzymes.  Fibrosis 4 Score = 1.25   Continue with medically supervised weight management program.  Continue to work on reducing saturated fats in diet as well as simple and added sugars.  Losing 15% of body weight may improve condition [name] has declined treatment with GLP-1 in the past Insulin  resistance Her HOMA-IR is 4.95 which is elevated but improved from previous of 7.3. Optimal level < 1.9.   This is complex condition associated with genetics, ectopic fat and lifestyle factors. Insulin  resistance may also result in weight gain, abnormal cravings (particularly for carbs) and fatigue.   This may result in additional weight gain and lead to pre-diabetes and diabetes if untreated. In addition, hyperinsulinemia increases cardiovascular risk, chronic inflammatory response and may increase the risk of obesity related malignancies.  Lab Results  Component Value Date   HGBA1C 5.3 03/13/2023   Lab Results  Component Value Date   INSULIN  22.3 08/20/2023   INSULIN  34.0 (H) 03/13/2023   Lab Results  Component Value Date   GLUCOSE 90 03/02/2024   GLUCOSE 83 09/29/2015   [name] has reached a weight loss plateau and is not interested in pharmacotherapy with metformin.  I reviewed labs done by her PCP and her fasting blood glucose was normal but there was no A1c or insulin  levels checked.  We may have to do this fasting at a future appointment.  Polycythemia  Recent CBC showed a decrease in hemoglobin.  [name] had a negative erythropoietin  and JAK2 test.  I recommend [name] have a repeat overnight oximetry to rule out nocturnal hypoxemia.  Previous evaluation was incomplete.  Pulmonary has been trying to get overnight oxygen test completed [name] will follow-up with vendor         Objective   Physical Exam:  Blood pressure 130/85, pulse 62, temperature 97.9 F (36.6 C), height 5' 4 (1.626 m), weight 178 lb (80.7 kg), SpO2 99%. Body mass index is 30.55  kg/m.  General: [name] is overweight, cooperative, alert, well developed, and in no acute distress. PSYCH: Has normal mood, affect and thought process.   HEENT: EOMI, sclerae are anicteric. Lungs: Normal breathing effort, no conversational dyspnea. Extremities: No edema.  Neurologic: No gross sensory or motor deficits. No tremors or fasciculations noted.    Diagnostic Data Reviewed:  BMET    Component Value Date/Time   NA 138 03/02/2024 1212   NA 141 08/20/2023 1305   K 4.3 03/02/2024 1212   CL 104 03/02/2024 1212   CO2 24 03/02/2024 1212   GLUCOSE 90 03/02/2024 1212   BUN 37 (H) 03/02/2024 1212   BUN 24 08/20/2023 1305   CREATININE 0.93 03/02/2024 1212   CREATININE 0.84 12/27/2015 0833   CALCIUM  10.2 03/02/2024 1212   GFRNONAA >60 01/02/2023 1200   GFRAA 38 (L) 09/29/2015 1553   Lab Results  Component Value Date   HGBA1C 5.3 03/13/2023   Lab Results  Component Value Date   INSULIN  22.3 08/20/2023   INSULIN  34.0 (H) 03/13/2023   Lab Results  Component Value Date   TSH 2.20 03/02/2024   CBC    Component Value Date/Time   WBC 7.9 03/02/2024 1212   RBC 5.94 (H) 03/02/2024 1212   HGB 15.7 (H) 03/02/2024 1212   HGB 15.2 11/19/2023 1559   HCT 50.0 (H) 03/02/2024 1212   HCT 48.3 (H) 11/19/2023 1559   PLT 208.0 03/02/2024 1212   PLT 212 11/19/2023 1559   MCV 84.2 03/02/2024 1212   MCV 86 11/19/2023 1559   MCH 26.9 11/19/2023 1559   MCH 27.2 01/02/2023 1200   MCHC 31.4 03/02/2024 1212   RDW 14.8 03/02/2024 1212   RDW 13.7 11/19/2023 1559   Iron Studies    Component Value Date/Time   IRON 77 03/13/2023 1109   TIBC 343 03/13/2023 1109   FERRITIN 59 03/13/2023 1109   IRONPCTSAT 22 03/13/2023 1109   Lipid Panel     Component Value Date/Time   CHOL 129 08/20/2023 1305   TRIG 62 08/20/2023 1305   HDL 56 08/20/2023 1305   CHOLHDL 2.8 03/13/2023 1109   LDLCALC 60 08/20/2023 1305   Hepatic Function Panel     Component Value Date/Time   PROT 7.8 03/02/2024  1212   PROT 7.2 08/20/2023 1305   ALBUMIN 4.7 03/02/2024 1212   ALBUMIN 4.6 08/20/2023 1305   AST 19 03/02/2024 1212   ALT 16 03/02/2024 1212   ALKPHOS 43 03/02/2024 1212   BILITOT 0.5 03/02/2024 1212   BILITOT 0.4 08/20/2023 1305   BILIDIR 0.15 11/14/2021 0912      Component Value Date/Time   TSH 2.20 03/02/2024 1212   Nutritional Lab Results  Component Value Date   VD25OH 69.1 08/20/2023   VD25OH 28.6 (L) 03/13/2023    Medications: Outpatient Encounter Medications as of 06/02/2024  Medication Sig   alclomethasone (ACLOVATE) 0.05 % cream Apply 1 Application topically 2 (two) times daily.   allopurinol (ZYLOPRIM) 100 MG tablet Take 100 mg by mouth daily.   aspirin  EC 81 MG  tablet Take 1 tablet (81 mg total) by mouth daily. SWALLOW WHOLE.   cetirizine (ZYRTEC) 10 MG tablet Take 10 mg by mouth in the morning.   clobetasol (OLUX) 0.05 % topical foam Apply 1 Application topically 2 (two) times daily.   Colchicine (COLCRYS PO) Take 0.6 mg by mouth. Was twice daily, now once daily, then taper   dicyclomine  (BENTYL ) 10 MG capsule Take 1 capsule (10 mg total) by mouth 4 (four) times daily -  before meals and at bedtime.   estradiol (ESTRACE) 0.1 MG/GM vaginal cream SMARTSIG:1 Gram(s) Vaginal 2-3 Times Weekly   ezetimibe  (ZETIA ) 10 MG tablet TAKE 1 TABLET BY MOUTH EVERY DAY   fluticasone  (FLONASE ) 50 MCG/ACT nasal spray Place 2 sprays into both nostrils daily.   ketoconazole (NIZORAL) 2 % cream Apply 1 Application topically 2 (two) times daily.   pravastatin  (PRAVACHOL ) 40 MG tablet TAKE 1 TABLET BY MOUTH EVERY DAY IN THE EVENING   spironolactone  (ALDACTONE ) 50 MG tablet TAKE 1 TABLET BY MOUTH EVERY DAY   venlafaxine XR (EFFEXOR-XR) 75 MG 24 hr capsule Take 75 mg by mouth daily.   No facility-administered encounter medications on file as of 06/02/2024.     Follow-Up   Return in about 8 weeks (around 07/28/2024) for For Weight Mangement with Dr. [email or web address] [name] was informed of the  importance of frequent follow up visits to maximize her success with intensive lifestyle modifications for her multiple health conditions.  Attestation Statement   Reviewed by clinician on day of visit: allergies, medications, problem list, medical history, surgical history, family history, social history, and previous encounter notes.     Lucas Francyne, MD

## 2024-06-02 NOTE — Assessment & Plan Note (Signed)
 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 Optimal level < 1.9.   This is complex condition associated with genetics, ectopic fat and lifestyle factors. Insulin  resistance may also result in weight gain, abnormal cravings (particularly for carbs) and fatigue.   This may result in additional weight gain and lead to pre-diabetes and diabetes if untreated. In addition, hyperinsulinemia increases cardiovascular risk, chronic inflammatory response and may increase the risk of obesity related malignancies.  Lab Results  Component Value Date   HGBA1C 5.3 03/13/2023   Lab Results  Component Value Date   INSULIN  22.3 08/20/2023   INSULIN  34.0 (H) 03/13/2023   Lab Results  Component Value Date   GLUCOSE 90 03/02/2024   GLUCOSE 83 09/29/2015   She has reached a weight loss plateau and is not interested in pharmacotherapy with metformin.  I reviewed labs done by her PCP and her fasting blood glucose was normal but there was no A1c or insulin  levels checked.  We may have to do this fasting at a future appointment.

## 2024-06-02 NOTE — Assessment & Plan Note (Signed)
 On CT scan in 2018 that showed diffuse hepatic steatosis.  Most recent labs showed normal liver enzymes and platelet count.  Recent ultrasound showed no hepatic steatosis.  Has declined treatment with GLP-1.  Recent labs showed normal platelet count and liver enzymes.  Fibrosis 4 Score = 1.25   Continue with medically supervised weight management program.  Continue to work on reducing saturated fats in diet as well as simple and added sugars.  Losing 15% of body weight may improve condition she has declined treatment with GLP-1 in the past

## 2024-06-03 ENCOUNTER — Ambulatory Visit: Payer: Self-pay | Admitting: Allergy & Immunology

## 2024-06-06 ENCOUNTER — Other Ambulatory Visit: Payer: Self-pay | Admitting: Pediatrics

## 2024-07-06 ENCOUNTER — Encounter (INDEPENDENT_AMBULATORY_CARE_PROVIDER_SITE_OTHER): Payer: Self-pay | Admitting: Internal Medicine

## 2024-07-07 ENCOUNTER — Telehealth (INDEPENDENT_AMBULATORY_CARE_PROVIDER_SITE_OTHER): Payer: Self-pay | Admitting: Internal Medicine

## 2024-07-07 NOTE — Telephone Encounter (Signed)
 Pt called and has a question about a test she is to be having done and would like a call back. She has also sent a message thru my chart as well.

## 2024-07-20 ENCOUNTER — Other Ambulatory Visit: Payer: Self-pay | Admitting: Internal Medicine

## 2024-07-20 DIAGNOSIS — Z1231 Encounter for screening mammogram for malignant neoplasm of breast: Secondary | ICD-10-CM

## 2024-07-27 ENCOUNTER — Ambulatory Visit
Admission: RE | Admit: 2024-07-27 | Discharge: 2024-07-27 | Disposition: A | Discharge status: Home / Self Care | Source: Ambulatory Visit | Attending: Internal Medicine | Admitting: Internal Medicine

## 2024-07-27 DIAGNOSIS — Z1231 Encounter for screening mammogram for malignant neoplasm of breast: Secondary | ICD-10-CM

## 2024-07-29 ENCOUNTER — Other Ambulatory Visit: Payer: Self-pay | Admitting: Pediatrics

## 2024-08-12 ENCOUNTER — Ambulatory Visit (INDEPENDENT_AMBULATORY_CARE_PROVIDER_SITE_OTHER): Admitting: Internal Medicine

## 2024-08-18 ENCOUNTER — Encounter (INDEPENDENT_AMBULATORY_CARE_PROVIDER_SITE_OTHER): Payer: Self-pay | Admitting: Internal Medicine

## 2024-08-18 ENCOUNTER — Ambulatory Visit (INDEPENDENT_AMBULATORY_CARE_PROVIDER_SITE_OTHER): Admitting: Internal Medicine

## 2024-08-18 VITALS — BP 134/85 | HR 87 | Temp 97.6°F | Ht 64.0 in | Wt 181.0 lb

## 2024-08-18 DIAGNOSIS — E66811 Obesity, class 1: Secondary | ICD-10-CM | POA: Diagnosis not present

## 2024-08-18 DIAGNOSIS — K760 Fatty (change of) liver, not elsewhere classified: Secondary | ICD-10-CM

## 2024-08-18 DIAGNOSIS — R809 Proteinuria, unspecified: Secondary | ICD-10-CM | POA: Diagnosis not present

## 2024-08-18 DIAGNOSIS — I10 Essential (primary) hypertension: Secondary | ICD-10-CM | POA: Diagnosis not present

## 2024-08-18 DIAGNOSIS — Z6830 Body mass index (BMI) 30.0-30.9, adult: Secondary | ICD-10-CM | POA: Diagnosis not present

## 2024-08-18 DIAGNOSIS — F439 Reaction to severe stress, unspecified: Secondary | ICD-10-CM

## 2024-08-18 NOTE — Assessment & Plan Note (Addendum)
 On CT scan in 2018 that showed diffuse hepatic steatosis.  Most recent labs showed normal liver enzymes and platelet count.  Recent ultrasound showed no hepatic steatosis.  Has declined treatment with GLP-1.  Recent labs showed normal platelet count and liver enzymes.  Fibrosis 4 Score = 1.25  Recent labs showed normal liver enzymes and synthetic function.  Continue current weight management strategy consider treatment with GLP-1 in the future

## 2024-08-18 NOTE — Assessment & Plan Note (Signed)
 Management is ongoing with challenges in maintaining weight loss due to stress and emotional eating. Recent weight gain post-holiday season. Metabolic rate is 8530, with weight loss starting under 1469 calories. Discussed calorie deficit strategies, including maintaining a deficit on some days and allowing more calories on weekends. Addressed types of hunger (brain, gastrointestinal, emotional) and their impact on weight management. Discussed potential benefits of GLP-1 medications for hunger control and weight loss, including nephroprotection and glycemic control. Emphasized lifestyle changes and environmental modifications to manage emotional hunger. - Provided a balanced, low-carb, moderate-protein meal plan with calorie targets. - Discussed GLP-1 medications as an option for hunger control and weight loss. - Encouraged lifestyle changes and environmental modifications to manage emotional hunger.

## 2024-08-18 NOTE — Assessment & Plan Note (Signed)
 Management is crucial due to its potential impact on proteinuria and kidney function. Current blood pressure management includes spironolactone . Discussed the importance of maintaining blood pressure closer to 120/80 to prevent kidney damage. - Monitor blood pressure trends for 7 days. - Will consider ACE inhibitor or ARB if blood pressure is elevated.

## 2024-08-18 NOTE — Progress Notes (Signed)
 "  Office: [phone]  /  Fax: [phone]  Weight Summary and Body Composition Analysis (BIA)  Vitals Temp: 97.6 F (36.4 C) BP: 134/85 Pulse Rate: 87 SpO2: 99 %   Anthropometric Measurements Height: 5' 4 (1.626 m) Weight: 181 lb (82.1 kg) BMI (Calculated): 31.05 Weight at Last Visit: 178 lb Weight Lost Since Last Visit: 0 lb Weight Gained Since Last Visit: 3 lb Starting Weight: 192 lb Total Weight Loss (lbs): 11 lb (4.99 kg) Peak Weight: 202 lb   Body Composition  Body Fat %: 40.9 % Fat Mass (lbs): 74.2 lbs Muscle Mass (lbs): 101.6 lbs Total Body Water (lbs): 66.6 lbs Visceral Fat Rating : 11    RMR: 1469  Today's Visit #: 19  Starting Date: 03/13/23   Subjective   Chief Complaint: Obesity  Interval History  Discussed the use of AI scribe software for clinical note transcription with the patient, who gave verbal consent to proceed.  History of Present Illness [name] is a 69 year old female who presents for medical weight management.  [name] has experienced recent weight gain following an illness in December, during which [name] initially lost five pounds but subsequently gained back those five pounds plus an additional eight pounds. [name] attributes part of the weight gain to decreased exercise over the holidays and increased caloric intake, including holiday treats. Despite consuming protein, which initially helped her lose weight, [name] notes a persistent feeling of hunger that was not sustained.  Her appetite is described as 'always hungry,' and [name] acknowledges challenges with 'stress eating,' particularly due to concerns about her daughter's health. [name] has been trying to manage her weight by monitoring her caloric intake, understanding that her metabolic rate is 8530 calories, and aiming for a calorie deficit to lose weight. However, [name] has not been exercising regularly due to disrupted schedules over the holidays.  Her daughter has been  experiencing recurrent episodes of pancreatitis, having had eight episodes in the past year and a half. Her daughter has undergone procedures including the placement and removal of a stent, which resulted in a severe pancreatitis episode. [name] is concerned about her daughter's health and the potential for future complications, including diabetes, due to chronic pancreatitis.  [name] has been found to have protein in her urine, first noted in October and again in December. A 24-hour urine collection showed 1283 mg of protein. [name] has not been treated with antibiotics for a bladder infection and has no white or red blood cells in her urine. Her GFR was 64 in August, and [name] is concerned about the potential for kidney disease.  [name] recently underwent an overnight pulse oximetry test which showed normal oxygen levels during sleep, with a lowest oxygen saturation of 92% and only two desaturation events.     Challenges affecting patient progress: strong hunger signals and/or impaired satiety / inhibitory control, moderate to high levels of stress, and emotional eating.    Pharmacotherapy for weight management: [name] is currently taking no anti-obesity medication and patient has declined pharmacotherapy in past.   Assessment and Plan   Treatment Plan For Obesity:  Recommended Dietary Goals  [name] is currently in the action stage of change. As such, her goal is to continue weight management plan. [name] has agreed to: follow a balanced (30%/40%/30%), whole foods-based, reduced-calorie meal plan (RCNP) targeting 1000 kcal per day  Behavioral Health and Counseling  We discussed the following behavioral modification strategies today: practice mindfulness eating and understand the difference between hunger signals  and cravings, work on managing stress, creating time for self-care and relaxation, and avoiding temptations and identifying enticing environmental cues.  Additional education and resources provided  today: None  Recommended Physical Activity Goals  [name] has been advised to work up to 150 minutes of moderate intensity aerobic activity a week and strengthening exercises 2-3 times per week for cardiovascular health, weight loss maintenance and preservation of muscle mass.  [name] has agreed to :  Think about enjoyable ways to increase daily physical activity and overcoming barriers to exercise, Increase physical activity in their day and reduce sedentary time (increase NEAT)., Increase volume of physical activity to a goal of 240 minutes a week, and Combine aerobic and strengthening exercises for efficiency and improved cardiometabolic health.  Medical Interventions and Pharmacotherapy  We discussed various medication options to help [name] with her weight loss efforts and we both agreed to : Continue with current nutritional and behavioral strategies and Has declined pharmacotherapy  Associated Conditions Impacted by Obesity Treatment  Assessment & Plan Primary hypertension Management is crucial due to its potential impact on proteinuria and kidney function. Current blood pressure management includes spironolactone . Discussed the importance of maintaining blood pressure closer to 120/80 to prevent kidney damage. - Monitor blood pressure trends for 7 days. - Will consider ACE inhibitor or ARB if blood pressure is elevated. Microalbuminuria Reviewed recent labs provided by patient [name] has microalbuminuria.  No history of diabetes. Metabolic dysfunction-associated steatotic liver disease (MASLD) On CT scan in 2018 that showed diffuse hepatic steatosis.  Most recent labs showed normal liver enzymes and platelet count.  Recent ultrasound showed no hepatic steatosis.  Has declined treatment with GLP-1.  Recent labs showed normal platelet count and liver enzymes.  Fibrosis 4 Score = 1.25  Recent labs showed normal liver enzymes and synthetic function.  Continue current weight management strategy  consider treatment with GLP-1 in the future Class 1 obesity with serious comorbidity and body mass index (BMI) of 30.0 to 30.9 in adult, unspecified obesity type Management is ongoing with challenges in maintaining weight loss due to stress and emotional eating. Recent weight gain post-holiday season. Metabolic rate is 8530, with weight loss starting under 1469 calories. Discussed calorie deficit strategies, including maintaining a deficit on some days and allowing more calories on weekends. Addressed types of hunger (brain, gastrointestinal, emotional) and their impact on weight management. Discussed potential benefits of GLP-1 medications for hunger control and weight loss, including nephroprotection and glycemic control. Emphasized lifestyle changes and environmental modifications to manage emotional hunger. - Provided a balanced, low-carb, moderate-protein meal plan with calorie targets. - Discussed GLP-1 medications as an option for hunger control and weight loss. - Encouraged lifestyle changes and environmental modifications to manage emotional hunger. Stress Related to daughter's health issues, contributing to emotional eating. Discussed strategies to manage stress without food, including non-food coping mechanisms and environmental changes. - Encouraged non-food coping mechanisms for stress management. - Suggested environmental changes to reduce food triggers.        Objective   Physical Exam:  Blood pressure 134/85, pulse 87, temperature 97.6 F (36.4 C), height 5' 4 (1.626 m), weight 181 lb (82.1 kg), SpO2 99%. Body mass index is 31.07 kg/m.  General: [name] is overweight, cooperative, alert, well developed, and in no acute distress. PSYCH: Has normal mood, affect and thought process.   HEENT: EOMI, sclerae are anicteric. Lungs: Normal breathing effort, no conversational dyspnea. Extremities: No edema.  Neurologic: No gross sensory or motor deficits. No tremors or fasciculations  noted.    Diagnostic Data Reviewed:  BMET    Component Value Date/Time   NA 138 03/02/2024 1212   NA 141 08/20/2023 1305   K 4.3 03/02/2024 1212   CL 104 03/02/2024 1212   CO2 24 03/02/2024 1212   GLUCOSE 90 03/02/2024 1212   BUN 37 (H) 03/02/2024 1212   BUN 24 08/20/2023 1305   CREATININE 0.93 03/02/2024 1212   CREATININE 0.84 12/27/2015 0833   CALCIUM  10.2 03/02/2024 1212   GFRNONAA >60 01/02/2023 1200   GFRAA 38 (L) 09/29/2015 1553   Lab Results  Component Value Date   HGBA1C 5.3 03/13/2023   Lab Results  Component Value Date   INSULIN  22.3 08/20/2023   INSULIN  34.0 (H) 03/13/2023   Lab Results  Component Value Date   TSH 2.20 03/02/2024   CBC    Component Value Date/Time   WBC 7.9 03/02/2024 1212   RBC 5.94 (H) 03/02/2024 1212   HGB 15.7 (H) 03/02/2024 1212   HGB 15.2 11/19/2023 1559   HCT 50.0 (H) 03/02/2024 1212   HCT 48.3 (H) 11/19/2023 1559   PLT 208.0 03/02/2024 1212   PLT 212 11/19/2023 1559   MCV 84.2 03/02/2024 1212   MCV 86 11/19/2023 1559   MCH 26.9 11/19/2023 1559   MCH 27.2 01/02/2023 1200   MCHC 31.4 03/02/2024 1212   RDW 14.8 03/02/2024 1212   RDW 13.7 11/19/2023 1559   Iron Studies    Component Value Date/Time   IRON 77 03/13/2023 1109   TIBC 343 03/13/2023 1109   FERRITIN 59 03/13/2023 1109   IRONPCTSAT 22 03/13/2023 1109   Lipid Panel     Component Value Date/Time   CHOL 129 08/20/2023 1305   TRIG 62 08/20/2023 1305   HDL 56 08/20/2023 1305   CHOLHDL 2.8 03/13/2023 1109   LDLCALC 60 08/20/2023 1305   Hepatic Function Panel     Component Value Date/Time   PROT 7.8 03/02/2024 1212   PROT 7.2 08/20/2023 1305   ALBUMIN 4.7 03/02/2024 1212   ALBUMIN 4.6 08/20/2023 1305   AST 19 03/02/2024 1212   ALT 16 03/02/2024 1212   ALKPHOS 43 03/02/2024 1212   BILITOT 0.5 03/02/2024 1212   BILITOT 0.4 08/20/2023 1305   BILIDIR 0.15 11/14/2021 0912      Component Value Date/Time   TSH 2.20 03/02/2024 1212   Nutritional Lab  Results  Component Value Date   VD25OH 69.1 08/20/2023   VD25OH 28.6 (L) 03/13/2023    Medications: Outpatient Encounter Medications as of 08/18/2024  Medication Sig   alclomethasone (ACLOVATE) 0.05 % cream Apply 1 Application topically 2 (two) times daily.   allopurinol (ZYLOPRIM) 100 MG tablet Take 100 mg by mouth daily.   aspirin  EC 81 MG tablet Take 1 tablet (81 mg total) by mouth daily. SWALLOW WHOLE.   cetirizine (ZYRTEC) 10 MG tablet Take 10 mg by mouth in the morning.   clobetasol (OLUX) 0.05 % topical foam Apply 1 Application topically 2 (two) times daily.   Colchicine (COLCRYS PO) Take 0.6 mg by mouth. Was twice daily, now once daily, then taper   dicyclomine  (BENTYL ) 10 MG capsule TAKE 1 CAPSULE (10 MG TOTAL) BY MOUTH 4 TIMES A DAY BEFORE MEALS AND AT BEDTIME   estradiol (ESTRACE) 0.1 MG/GM vaginal cream SMARTSIG:1 Gram(s) Vaginal 2-3 Times Weekly   ezetimibe  (ZETIA ) 10 MG tablet TAKE 1 TABLET BY MOUTH EVERY DAY   fluticasone  (FLONASE ) 50 MCG/ACT nasal spray Place 2 sprays into both nostrils daily.  ketoconazole (NIZORAL) 2 % cream Apply 1 Application topically 2 (two) times daily.   pravastatin  (PRAVACHOL ) 40 MG tablet TAKE 1 TABLET BY MOUTH EVERY DAY IN THE EVENING   spironolactone  (ALDACTONE ) 50 MG tablet TAKE 1 TABLET BY MOUTH EVERY DAY   venlafaxine XR (EFFEXOR-XR) 75 MG 24 hr capsule Take 75 mg by mouth daily.   No facility-administered encounter medications on file as of 08/18/2024.     Follow-Up   Return in about 8 weeks (around 10/13/2024) for For Weight Mangement with Dr. [email or web address] [name] was informed of the importance of frequent follow up visits to maximize her success with intensive lifestyle modifications for her multiple health conditions.  Attestation Statement   Reviewed by clinician on day of visit: allergies, medications, problem list, medical history, surgical history, family history, social history, and previous encounter notes.   I have spent 44  minutes in the care of the patient today including: 3 minutes before the visit reviewing and preparing the chart. 36 minutes face-to-face assessing and reviewing listed medical problems as outlined in obesity care plan, providing nutritional and behavioral counseling on topics outlined in the obesity care plan, counseling regarding anti-obesity medication as outlined in obesity care plan, independently interpreting test results and goals of care, as described in assessment and plan, reviewing and discussing biometric information and progress, and reviewing external labs 5 minutes after the visit updating chart and documentation of encounter.    Claudia Francyne, MD  "

## 2024-09-16 ENCOUNTER — Ambulatory Visit: Payer: Medicare Other | Admitting: Neurology

## 2024-10-13 ENCOUNTER — Ambulatory Visit (INDEPENDENT_AMBULATORY_CARE_PROVIDER_SITE_OTHER): Admitting: Internal Medicine

## 2024-12-06 ENCOUNTER — Ambulatory Visit (HOSPITAL_BASED_OUTPATIENT_CLINIC_OR_DEPARTMENT_OTHER)
